# Patient Record
Sex: Female | Born: 1935 | Race: White | Hispanic: No | Smoking: Former smoker
Health system: Southern US, Community
[De-identification: ages and names within clinical notes are randomized; demographics above are authoritative.]

## PROBLEM LIST (undated history)

## (undated) DIAGNOSIS — E039 Hypothyroidism, unspecified: Secondary | ICD-10-CM

## (undated) DIAGNOSIS — F419 Anxiety disorder, unspecified: Secondary | ICD-10-CM

## (undated) DIAGNOSIS — M1710 Unilateral primary osteoarthritis, unspecified knee: Secondary | ICD-10-CM

## (undated) DIAGNOSIS — R0600 Dyspnea, unspecified: Secondary | ICD-10-CM

## (undated) DIAGNOSIS — F339 Major depressive disorder, recurrent, unspecified: Secondary | ICD-10-CM

## (undated) DIAGNOSIS — K5790 Diverticulosis of intestine, part unspecified, without perforation or abscess without bleeding: Secondary | ICD-10-CM

## (undated) DIAGNOSIS — M179 Osteoarthritis of knee, unspecified: Secondary | ICD-10-CM

## (undated) DIAGNOSIS — E213 Hyperparathyroidism, unspecified: Secondary | ICD-10-CM

## (undated) DIAGNOSIS — I82409 Acute embolism and thrombosis of unspecified deep veins of unspecified lower extremity: Secondary | ICD-10-CM

## (undated) DIAGNOSIS — M1380 Other specified arthritis, unspecified site: Secondary | ICD-10-CM

## (undated) DIAGNOSIS — K589 Irritable bowel syndrome without diarrhea: Secondary | ICD-10-CM

## (undated) DIAGNOSIS — E785 Hyperlipidemia, unspecified: Secondary | ICD-10-CM

## (undated) DIAGNOSIS — M1990 Unspecified osteoarthritis, unspecified site: Secondary | ICD-10-CM

## (undated) DIAGNOSIS — I10 Essential (primary) hypertension: Secondary | ICD-10-CM

## (undated) HISTORY — DX: Hyperparathyroidism, unspecified: E21.3

## (undated) HISTORY — PX: WRIST SURGERY: SHX841

## (undated) HISTORY — PX: COLONOSCOPY: SHX174

## (undated) HISTORY — DX: Hypothyroidism, unspecified: E03.9

## (undated) HISTORY — DX: Acute embolism and thrombosis of unspecified deep veins of unspecified lower extremity: I82.409

## (undated) HISTORY — DX: Irritable bowel syndrome, unspecified: K58.9

## (undated) HISTORY — DX: Unilateral primary osteoarthritis, unspecified knee: M17.10

## (undated) HISTORY — DX: Other specified arthritis, unspecified site: M13.80

## (undated) HISTORY — DX: Hyperlipidemia, unspecified: E78.5

## (undated) HISTORY — DX: Unspecified osteoarthritis, unspecified site: M19.90

## (undated) HISTORY — DX: Diverticulosis of intestine, part unspecified, without perforation or abscess without bleeding: K57.90

## (undated) HISTORY — DX: Osteoarthritis of knee, unspecified: M17.9

## (undated) HISTORY — DX: Major depressive disorder, recurrent, unspecified: F33.9

---

## 1989-06-16 HISTORY — PX: ABDOMINAL HYSTERECTOMY: SHX81

## 1991-06-17 HISTORY — PX: LUMBAR FUSION: SHX111

## 1996-06-16 HISTORY — PX: CHOLECYSTECTOMY: SHX55

## 2007-08-15 HISTORY — PX: OTHER SURGICAL HISTORY: SHX169

## 2008-06-16 LAB — HM COLONOSCOPY

## 2010-04-16 HISTORY — PX: MENISCUS REPAIR: SHX5179

## 2010-06-16 DIAGNOSIS — I82409 Acute embolism and thrombosis of unspecified deep veins of unspecified lower extremity: Secondary | ICD-10-CM

## 2010-06-16 HISTORY — DX: Acute embolism and thrombosis of unspecified deep veins of unspecified lower extremity: I82.409

## 2010-06-16 HISTORY — PX: TOTAL KNEE ARTHROPLASTY: SHX125

## 2010-06-16 LAB — HM DEXA SCAN: HM Dexa Scan: NORMAL

## 2011-06-23 DIAGNOSIS — N3090 Cystitis, unspecified without hematuria: Secondary | ICD-10-CM | POA: Diagnosis not present

## 2011-06-23 DIAGNOSIS — R82998 Other abnormal findings in urine: Secondary | ICD-10-CM | POA: Diagnosis not present

## 2011-07-28 DIAGNOSIS — J060 Acute laryngopharyngitis: Secondary | ICD-10-CM | POA: Diagnosis not present

## 2011-08-18 DIAGNOSIS — K5900 Constipation, unspecified: Secondary | ICD-10-CM | POA: Diagnosis not present

## 2011-08-18 DIAGNOSIS — Z86010 Personal history of colonic polyps: Secondary | ICD-10-CM | POA: Diagnosis not present

## 2011-08-18 DIAGNOSIS — K5730 Diverticulosis of large intestine without perforation or abscess without bleeding: Secondary | ICD-10-CM | POA: Diagnosis not present

## 2011-08-20 DIAGNOSIS — Z79899 Other long term (current) drug therapy: Secondary | ICD-10-CM | POA: Diagnosis not present

## 2011-08-20 DIAGNOSIS — E559 Vitamin D deficiency, unspecified: Secondary | ICD-10-CM | POA: Diagnosis not present

## 2011-08-20 DIAGNOSIS — E039 Hypothyroidism, unspecified: Secondary | ICD-10-CM | POA: Diagnosis not present

## 2011-08-20 DIAGNOSIS — E782 Mixed hyperlipidemia: Secondary | ICD-10-CM | POA: Diagnosis not present

## 2011-08-26 DIAGNOSIS — Z1211 Encounter for screening for malignant neoplasm of colon: Secondary | ICD-10-CM | POA: Diagnosis not present

## 2011-08-26 DIAGNOSIS — K5730 Diverticulosis of large intestine without perforation or abscess without bleeding: Secondary | ICD-10-CM | POA: Diagnosis not present

## 2011-08-26 DIAGNOSIS — Z86010 Personal history of colonic polyps: Secondary | ICD-10-CM | POA: Diagnosis not present

## 2011-08-27 DIAGNOSIS — Z538 Procedure and treatment not carried out for other reasons: Secondary | ICD-10-CM | POA: Diagnosis not present

## 2011-09-18 DIAGNOSIS — R05 Cough: Secondary | ICD-10-CM | POA: Diagnosis not present

## 2011-09-18 DIAGNOSIS — R0602 Shortness of breath: Secondary | ICD-10-CM | POA: Diagnosis not present

## 2011-10-29 DIAGNOSIS — F331 Major depressive disorder, recurrent, moderate: Secondary | ICD-10-CM | POA: Diagnosis not present

## 2011-12-02 DIAGNOSIS — K589 Irritable bowel syndrome without diarrhea: Secondary | ICD-10-CM | POA: Diagnosis not present

## 2011-12-02 DIAGNOSIS — K5730 Diverticulosis of large intestine without perforation or abscess without bleeding: Secondary | ICD-10-CM | POA: Diagnosis not present

## 2011-12-02 DIAGNOSIS — Z09 Encounter for follow-up examination after completed treatment for conditions other than malignant neoplasm: Secondary | ICD-10-CM | POA: Diagnosis not present

## 2011-12-02 DIAGNOSIS — Z96659 Presence of unspecified artificial knee joint: Secondary | ICD-10-CM | POA: Diagnosis not present

## 2011-12-23 DIAGNOSIS — H2589 Other age-related cataract: Secondary | ICD-10-CM | POA: Diagnosis not present

## 2011-12-23 DIAGNOSIS — H5034 Intermittent alternating exotropia: Secondary | ICD-10-CM | POA: Diagnosis not present

## 2011-12-23 DIAGNOSIS — L719 Rosacea, unspecified: Secondary | ICD-10-CM | POA: Diagnosis not present

## 2011-12-24 DIAGNOSIS — F331 Major depressive disorder, recurrent, moderate: Secondary | ICD-10-CM | POA: Diagnosis not present

## 2012-01-13 DIAGNOSIS — K589 Irritable bowel syndrome without diarrhea: Secondary | ICD-10-CM | POA: Diagnosis not present

## 2012-01-13 DIAGNOSIS — K5730 Diverticulosis of large intestine without perforation or abscess without bleeding: Secondary | ICD-10-CM | POA: Diagnosis not present

## 2012-01-13 DIAGNOSIS — K5900 Constipation, unspecified: Secondary | ICD-10-CM | POA: Diagnosis not present

## 2012-01-26 DIAGNOSIS — H903 Sensorineural hearing loss, bilateral: Secondary | ICD-10-CM | POA: Diagnosis not present

## 2012-01-26 DIAGNOSIS — H6120 Impacted cerumen, unspecified ear: Secondary | ICD-10-CM | POA: Diagnosis not present

## 2012-01-27 DIAGNOSIS — E210 Primary hyperparathyroidism: Secondary | ICD-10-CM | POA: Diagnosis not present

## 2012-01-27 DIAGNOSIS — E039 Hypothyroidism, unspecified: Secondary | ICD-10-CM | POA: Diagnosis not present

## 2012-01-27 DIAGNOSIS — E559 Vitamin D deficiency, unspecified: Secondary | ICD-10-CM | POA: Diagnosis not present

## 2012-02-03 DIAGNOSIS — M899 Disorder of bone, unspecified: Secondary | ICD-10-CM | POA: Diagnosis not present

## 2012-02-03 DIAGNOSIS — E039 Hypothyroidism, unspecified: Secondary | ICD-10-CM | POA: Diagnosis not present

## 2012-02-03 DIAGNOSIS — E210 Primary hyperparathyroidism: Secondary | ICD-10-CM | POA: Diagnosis not present

## 2012-02-03 DIAGNOSIS — E782 Mixed hyperlipidemia: Secondary | ICD-10-CM | POA: Diagnosis not present

## 2012-02-24 DIAGNOSIS — L719 Rosacea, unspecified: Secondary | ICD-10-CM | POA: Diagnosis not present

## 2012-02-24 DIAGNOSIS — K589 Irritable bowel syndrome without diarrhea: Secondary | ICD-10-CM | POA: Diagnosis not present

## 2012-02-24 DIAGNOSIS — H2589 Other age-related cataract: Secondary | ICD-10-CM | POA: Diagnosis not present

## 2012-03-23 DIAGNOSIS — F331 Major depressive disorder, recurrent, moderate: Secondary | ICD-10-CM | POA: Diagnosis not present

## 2012-04-05 DIAGNOSIS — Z1231 Encounter for screening mammogram for malignant neoplasm of breast: Secondary | ICD-10-CM | POA: Diagnosis not present

## 2012-04-05 LAB — HM MAMMOGRAPHY: HM Mammogram: NEGATIVE

## 2012-04-06 DIAGNOSIS — H2589 Other age-related cataract: Secondary | ICD-10-CM | POA: Diagnosis not present

## 2012-05-05 DIAGNOSIS — E7800 Pure hypercholesterolemia, unspecified: Secondary | ICD-10-CM | POA: Diagnosis not present

## 2012-05-05 DIAGNOSIS — E559 Vitamin D deficiency, unspecified: Secondary | ICD-10-CM | POA: Diagnosis not present

## 2012-05-05 DIAGNOSIS — Z79899 Other long term (current) drug therapy: Secondary | ICD-10-CM | POA: Diagnosis not present

## 2012-05-05 DIAGNOSIS — Z0000 Encounter for general adult medical examination without abnormal findings: Secondary | ICD-10-CM | POA: Diagnosis not present

## 2012-05-18 DIAGNOSIS — H2589 Other age-related cataract: Secondary | ICD-10-CM | POA: Diagnosis not present

## 2012-05-18 DIAGNOSIS — L719 Rosacea, unspecified: Secondary | ICD-10-CM | POA: Diagnosis not present

## 2012-05-27 DIAGNOSIS — F331 Major depressive disorder, recurrent, moderate: Secondary | ICD-10-CM | POA: Diagnosis not present

## 2012-06-16 HISTORY — PX: CATARACT EXTRACTION W/ INTRAOCULAR LENS  IMPLANT, BILATERAL: SHX1307

## 2012-08-11 DIAGNOSIS — E559 Vitamin D deficiency, unspecified: Secondary | ICD-10-CM | POA: Diagnosis not present

## 2012-08-11 DIAGNOSIS — E039 Hypothyroidism, unspecified: Secondary | ICD-10-CM | POA: Diagnosis not present

## 2012-08-11 DIAGNOSIS — E782 Mixed hyperlipidemia: Secondary | ICD-10-CM | POA: Diagnosis not present

## 2012-11-03 DIAGNOSIS — Z7189 Other specified counseling: Secondary | ICD-10-CM | POA: Diagnosis not present

## 2012-11-23 DIAGNOSIS — H2589 Other age-related cataract: Secondary | ICD-10-CM | POA: Diagnosis not present

## 2012-11-23 DIAGNOSIS — L719 Rosacea, unspecified: Secondary | ICD-10-CM | POA: Diagnosis not present

## 2012-12-30 DIAGNOSIS — H2589 Other age-related cataract: Secondary | ICD-10-CM | POA: Diagnosis not present

## 2013-01-19 DIAGNOSIS — Z981 Arthrodesis status: Secondary | ICD-10-CM | POA: Diagnosis not present

## 2013-01-19 DIAGNOSIS — Z96659 Presence of unspecified artificial knee joint: Secondary | ICD-10-CM | POA: Diagnosis not present

## 2013-01-19 DIAGNOSIS — M129 Arthropathy, unspecified: Secondary | ICD-10-CM | POA: Diagnosis not present

## 2013-01-19 DIAGNOSIS — E039 Hypothyroidism, unspecified: Secondary | ICD-10-CM | POA: Diagnosis not present

## 2013-01-19 DIAGNOSIS — H2589 Other age-related cataract: Secondary | ICD-10-CM | POA: Diagnosis not present

## 2013-01-26 DIAGNOSIS — H2589 Other age-related cataract: Secondary | ICD-10-CM | POA: Diagnosis not present

## 2013-02-25 DIAGNOSIS — E039 Hypothyroidism, unspecified: Secondary | ICD-10-CM | POA: Diagnosis not present

## 2013-02-25 DIAGNOSIS — H2589 Other age-related cataract: Secondary | ICD-10-CM | POA: Diagnosis not present

## 2013-03-31 DIAGNOSIS — Z23 Encounter for immunization: Secondary | ICD-10-CM | POA: Diagnosis not present

## 2013-05-23 ENCOUNTER — Encounter: Payer: Self-pay | Admitting: Internal Medicine

## 2013-05-23 ENCOUNTER — Ambulatory Visit (INDEPENDENT_AMBULATORY_CARE_PROVIDER_SITE_OTHER): Payer: Medicare Other | Admitting: Internal Medicine

## 2013-05-23 VITALS — BP 120/78 | HR 87 | Temp 98.6°F | Ht 65.5 in | Wt 158.5 lb

## 2013-05-23 DIAGNOSIS — K589 Irritable bowel syndrome without diarrhea: Secondary | ICD-10-CM | POA: Diagnosis not present

## 2013-05-23 DIAGNOSIS — E785 Hyperlipidemia, unspecified: Secondary | ICD-10-CM | POA: Diagnosis not present

## 2013-05-23 DIAGNOSIS — E039 Hypothyroidism, unspecified: Secondary | ICD-10-CM | POA: Diagnosis not present

## 2013-05-23 DIAGNOSIS — F339 Major depressive disorder, recurrent, unspecified: Secondary | ICD-10-CM | POA: Diagnosis not present

## 2013-05-23 DIAGNOSIS — M179 Osteoarthritis of knee, unspecified: Secondary | ICD-10-CM | POA: Insufficient documentation

## 2013-05-23 DIAGNOSIS — M1710 Unilateral primary osteoarthritis, unspecified knee: Secondary | ICD-10-CM

## 2013-05-23 MED ORDER — PRAVASTATIN SODIUM 40 MG PO TABS: 40.0000 mg | ORAL_TABLET | Freq: Every day | ORAL | Status: DC

## 2013-05-23 NOTE — Assessment & Plan Note (Signed)
Seems euthyroid Had labs 6 months ago Will wait till PE in 6 months to repeat

## 2013-05-23 NOTE — Assessment & Plan Note (Signed)
Discussed primary prevention She is not sure Will review again at next visit

## 2013-05-23 NOTE — Assessment & Plan Note (Signed)
Controlled with her regimen

## 2013-05-23 NOTE — Progress Notes (Signed)
Subjective:    Patient ID: Wendy Murray, female    DOB: 1935-08-30, 77 y.o.   MRN: [id number]  HPI Moved to [facility] this summer With husband  From [place] area Has settled in and enjoying it  Has had recurrent major depression since dad died in 1983-10-27 Some mild exacerbations but now stable for about 10 years Sees psychiatrist Dr Richardean Chimera in [place] regularly Multiple meds  On cholesterol meds for about 15 years Prescribed by endocrinologist No history of vascular disease  Hypothyroidism for many years Quite stable on this with no dose changes for years  Has IBS---controlled with metamucil and miralax Bowels move daily Colonoscopy just showed diverticulosis  No current outpatient prescriptions on file prior to visit.   No current facility-administered medications on file prior to visit.    No Known Allergies  Past Medical History  Diagnosis Date  . Hyperlipidemia   . Recurrent major depression   . Hypothyroidism   . DVT (deep venous thrombosis) 10/27/10    after TKR  . Osteoarthritis, knee   . IBS (irritable bowel syndrome)     Past Surgical History  Procedure Laterality Date  . Meniscus repair Left 11/11  . Total knee arthroplasty Left 10/27/2010  . Lumbar fusion  1993  . Cholecystectomy  1998  . Abdominal hysterectomy  1991  . Cataract extraction w/ intraocular lens  implant, bilateral  2014    Family History  Problem Relation Age of Onset  . Cancer Mother     colon  . Depression Mother   . Cancer Father   . Depression Sister   . Hypertension Neg Hx   . Heart disease Neg Hx   . Diabetes Neg Hx     History   Social History  . Marital Status: Married    Spouse Name: N/A    Number of Children: 3  . Years of Education: N/A   Occupational History  . 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 Smoking status: Former Smoker    Quit date: 06/16/1981  . Smokeless tobacco: Never Used     Comment: quit  1983  . Alcohol Use: Yes     Comment: rarely  . Drug Use: No  . Sexual Activity: Not on file   Other Topics Concern  . Not on file   Social History Narrative   Children in [place], [place] and [place]      Has living will    Husband, then daughter [name], have health care POA   Has DNR ---order rewritten   No feeding tube if cognitively unaware   Review of Systems  Constitutional: Negative for fatigue and unexpected weight change.  HENT: Positive for hearing loss. Negative for dental problem.        Slight hearing issues  Eyes: Negative for visual disturbance.       Recent cataract surgery  Respiratory: Negative for cough, chest tightness and shortness of breath.   Cardiovascular: Negative for chest pain, palpitations and leg swelling.  Gastrointestinal: Negative for nausea, vomiting and blood in stool.       IBS controlled with metamucil and miralax  Genitourinary: Negative for dysuria, urgency and difficulty urinating.  Musculoskeletal: Positive for arthralgias. Negative for back pain and joint swelling.  Skin: Negative for rash.  Allergic/Immunologic: Negative for environmental allergies and immunocompromised state.  Neurological: Negative for dizziness, syncope and light-headedness.       Frequent headaches---takes excedrin  every other day or so. No caffeine in drinks or diet  Psychiatric/Behavioral: Negative for sleep disturbance and dysphoric mood. The patient is not nervous/anxious.        Objective:   Physical Exam  Constitutional: She is oriented to person, place, and time. She appears well-developed and well-nourished. No distress.  HENT:  Mouth/Throat: Oropharynx is clear and moist. No oropharyngeal exudate.  Eyes: Conjunctivae are normal. Pupils are equal, round, and reactive to light.  Neck: Normal range of motion. Neck supple. No thyromegaly present.  Cardiovascular: Normal rate, regular rhythm, normal heart sounds and intact distal pulses.  Exam reveals  no gallop.   No murmur heard. Pulmonary/Chest: Effort normal and breath sounds normal. No respiratory distress. She has no wheezes. She has no rales.  Abdominal: Soft. There is no tenderness.  Musculoskeletal: She exhibits no edema and no tenderness.  Lymphadenopathy:    She has no cervical adenopathy.  Neurological: She is alert and oriented to person, place, and time.  Skin: No rash noted. No erythema.  Psychiatric: She has a normal mood and affect. Her behavior is normal.          Assessment & Plan:

## 2013-05-23 NOTE — Progress Notes (Signed)
Pre-visit discussion using our clinic review tool. No additional management support is needed unless otherwise documented below in the visit note.  

## 2013-05-23 NOTE — Assessment & Plan Note (Signed)
Is involved in fitness program now No major pain issues

## 2013-05-23 NOTE — Assessment & Plan Note (Signed)
Controlled with complicated regimen Continues with psychiatrist

## 2013-05-23 NOTE — Patient Instructions (Signed)
DASH Diet  The DASH diet stands for "Dietary Approaches to Stop Hypertension." It is a healthy eating plan that has been shown to reduce high blood pressure (hypertension) in as little as 14 days, while also possibly providing other significant health benefits. These other health benefits include reducing the risk of breast cancer after menopause and reducing the risk of type 2 diabetes, heart disease, colon cancer, and stroke. Health benefits also include weight loss and slowing kidney failure in patients with chronic kidney disease.   DIET GUIDELINES  · Limit salt (sodium). Your diet should contain less than 1500 mg of sodium daily.  · Limit refined or processed carbohydrates. Your diet should include mostly whole grains. Desserts and added sugars should be used sparingly.  · Include small amounts of heart-healthy fats. These types of fats include nuts, oils, and tub margarine. Limit saturated and trans fats. These fats have been shown to be harmful in the body.  CHOOSING FOODS   The following food groups are based on a 2000 calorie diet. See your Registered Dietitian for individual calorie needs.  Grains and Grain Products (6 to 8 servings daily)  · Eat More Often: Whole-wheat bread, brown rice, whole-grain or wheat pasta, quinoa, popcorn without added fat or salt (air popped).  · Eat Less Often: White bread, white pasta, white rice, cornbread.  Vegetables (4 to 5 servings daily)  · Eat More Often: Fresh, frozen, and canned vegetables. Vegetables may be raw, steamed, roasted, or grilled with a minimal amount of fat.  · Eat Less Often/Avoid: Creamed or fried vegetables. Vegetables in a cheese sauce.  Fruit (4 to 5 servings daily)  · Eat More Often: All fresh, canned (in natural juice), or frozen fruits. Dried fruits without added sugar. One hundred percent fruit juice (½ cup [237 mL] daily).  · Eat Less Often: Dried fruits with added sugar. Canned fruit in light or heavy syrup.  Lean Meats, Fish, and Poultry (2  servings or less daily. One serving is 3 to 4 oz [85-114 g]).  · Eat More Often: Ninety percent or leaner ground beef, tenderloin, sirloin. Round cuts of beef, chicken breast, turkey breast. All fish. Grill, bake, or broil your meat. Nothing should be fried.  · Eat Less Often/Avoid: Fatty cuts of meat, turkey, or chicken leg, thigh, or wing. Fried cuts of meat or fish.  Dairy (2 to 3 servings)  · Eat More Often: Low-fat or fat-free milk, low-fat plain or light yogurt, reduced-fat or part-skim cheese.  · Eat Less Often/Avoid: Milk (whole, 2%). Whole milk yogurt. Full-fat cheeses.  Nuts, Seeds, and Legumes (4 to 5 servings per week)  · Eat More Often: All without added salt.  · Eat Less Often/Avoid: Salted nuts and seeds, canned beans with added salt.  Fats and Sweets (limited)  · Eat More Often: Vegetable oils, tub margarines without trans fats, sugar-free gelatin. Mayonnaise and salad dressings.  · Eat Less Often/Avoid: Coconut oils, palm oils, butter, stick margarine, cream, half and half, cookies, candy, pie.  FOR MORE INFORMATION  The Dash Diet Eating Plan: www.dashdiet.org  Document Released: 05/22/2011 Document Revised: 08/25/2011 Document Reviewed: 05/22/2011  ExitCare® Patient Information ©2014 [employer].

## 2013-05-27 ENCOUNTER — Encounter: Payer: Self-pay | Admitting: Internal Medicine

## 2013-06-03 ENCOUNTER — Encounter: Payer: Self-pay | Admitting: Internal Medicine

## 2013-06-22 DIAGNOSIS — N6489 Other specified disorders of breast: Secondary | ICD-10-CM | POA: Diagnosis not present

## 2013-06-22 DIAGNOSIS — Z1231 Encounter for screening mammogram for malignant neoplasm of breast: Secondary | ICD-10-CM | POA: Diagnosis not present

## 2013-06-23 ENCOUNTER — Encounter: Payer: Self-pay | Admitting: Internal Medicine

## 2013-08-22 DIAGNOSIS — H26499 Other secondary cataract, unspecified eye: Secondary | ICD-10-CM | POA: Diagnosis not present

## 2013-09-13 ENCOUNTER — Other Ambulatory Visit: Payer: Self-pay | Admitting: *Deleted

## 2013-09-13 NOTE — Telephone Encounter (Signed)
Ok to fill 

## 2013-09-14 MED ORDER — LIOTHYRONINE SODIUM 5 MCG PO TABS: 10.0000 ug | ORAL_TABLET | Freq: Every day | ORAL | Status: DC

## 2013-09-14 MED ORDER — PRAVASTATIN SODIUM 40 MG PO TABS: 40.0000 mg | ORAL_TABLET | Freq: Every day | ORAL | Status: DC

## 2013-09-14 MED ORDER — LEVOTHYROXINE SODIUM 75 MCG PO TABS: 75.0000 ug | ORAL_TABLET | Freq: Every day | ORAL | Status: DC

## 2013-09-14 NOTE — Telephone Encounter (Signed)
Okay to fill all for a year

## 2013-09-14 NOTE — Telephone Encounter (Signed)
rx called into pharmacy

## 2013-12-08 DIAGNOSIS — L719 Rosacea, unspecified: Secondary | ICD-10-CM | POA: Diagnosis not present

## 2013-12-08 DIAGNOSIS — H16229 Keratoconjunctivitis sicca, not specified as Sjogren's, unspecified eye: Secondary | ICD-10-CM | POA: Diagnosis not present

## 2013-12-12 ENCOUNTER — Encounter: Payer: Self-pay | Admitting: Internal Medicine

## 2013-12-12 ENCOUNTER — Ambulatory Visit (INDEPENDENT_AMBULATORY_CARE_PROVIDER_SITE_OTHER): Payer: Medicare Other | Admitting: Internal Medicine

## 2013-12-12 VITALS — BP 110/78 | HR 71 | Temp 97.7°F | Ht 65.5 in | Wt 160.0 lb

## 2013-12-12 DIAGNOSIS — M1710 Unilateral primary osteoarthritis, unspecified knee: Secondary | ICD-10-CM | POA: Diagnosis not present

## 2013-12-12 DIAGNOSIS — Z23 Encounter for immunization: Secondary | ICD-10-CM | POA: Diagnosis not present

## 2013-12-12 DIAGNOSIS — Z0000 Encounter for general adult medical examination without abnormal findings: Secondary | ICD-10-CM | POA: Insufficient documentation

## 2013-12-12 DIAGNOSIS — F331 Major depressive disorder, recurrent, moderate: Secondary | ICD-10-CM

## 2013-12-12 DIAGNOSIS — E039 Hypothyroidism, unspecified: Secondary | ICD-10-CM | POA: Diagnosis not present

## 2013-12-12 DIAGNOSIS — E785 Hyperlipidemia, unspecified: Secondary | ICD-10-CM | POA: Diagnosis not present

## 2013-12-12 DIAGNOSIS — M170 Bilateral primary osteoarthritis of knee: Secondary | ICD-10-CM

## 2013-12-12 LAB — COMPREHENSIVE METABOLIC PANEL
ALK PHOS: 70 U/L (ref 39–117)
ALT: 14 U/L (ref 0–35)
AST: 18 U/L (ref 0–37)
Albumin: 4.2 g/dL (ref 3.5–5.2)
BILIRUBIN TOTAL: 0.6 mg/dL (ref 0.2–1.2)
BUN: 16 mg/dL (ref 6–23)
CO2: 29 mEq/L (ref 19–32)
CREATININE: 1 mg/dL (ref 0.4–1.2)
Calcium: 10.6 mg/dL — ABNORMAL HIGH (ref 8.4–10.5)
Chloride: 105 mEq/L (ref 96–112)
GFR: 57.23 mL/min — ABNORMAL LOW (ref >60.00)
GLUCOSE: 102 mg/dL — AB (ref 70–99)
Potassium: 4.7 mEq/L (ref 3.5–5.1)
SODIUM: 141 meq/L (ref 135–145)
TOTAL PROTEIN: 7.4 g/dL (ref 6.0–8.3)

## 2013-12-12 LAB — CBC WITH DIFFERENTIAL/PLATELET
BASOS PCT: 0.5 % (ref 0.0–3.0)
Basophils Absolute: 0 10*3/uL (ref 0.0–0.1)
EOS PCT: 1.2 % (ref 0.0–5.0)
Eosinophils Absolute: 0.1 10*3/uL (ref 0.0–0.7)
HEMATOCRIT: 40.3 % (ref 36.0–46.0)
HEMOGLOBIN: 13.2 g/dL (ref 12.0–15.0)
LYMPHS ABS: 1.7 10*3/uL (ref 0.7–4.0)
Lymphocytes Relative: 22.1 % (ref 12.0–46.0)
MCHC: 32.8 g/dL (ref 30.0–36.0)
MCV: 84.7 fl (ref 78.0–100.0)
MONO ABS: 0.7 10*3/uL (ref 0.1–1.0)
MONOS PCT: 9.6 % (ref 3.0–12.0)
NEUTROS ABS: 5.2 10*3/uL (ref 1.4–7.7)
Neutrophils Relative %: 66.6 % (ref 43.0–77.0)
PLATELETS: 353 10*3/uL (ref 150.0–400.0)
RBC: 4.76 Mil/uL (ref 3.87–5.11)
RDW: 14.6 % (ref 11.5–15.5)
WBC: 7.8 10*3/uL (ref 4.0–10.5)

## 2013-12-12 LAB — LIPID PANEL
CHOLESTEROL: 183 mg/dL (ref 0–200)
HDL: 43.4 mg/dL (ref >39.00)
LDL Cholesterol: 108 mg/dL — ABNORMAL HIGH (ref 0–99)
NonHDL: 139.6
Total CHOL/HDL Ratio: 4
Triglycerides: 158 mg/dL — ABNORMAL HIGH (ref 0.0–149.0)
VLDL: 31.6 mg/dL (ref 0.0–40.0)

## 2013-12-12 LAB — T4, FREE: Free T4: 0.72 ng/dL (ref 0.60–1.60)

## 2013-12-12 LAB — TSH: TSH: 1.04 u[IU]/mL (ref 0.35–4.50)

## 2013-12-12 NOTE — Assessment & Plan Note (Signed)
She is comfortable continuing with primary prevention

## 2013-12-12 NOTE — Assessment & Plan Note (Addendum)
Now exacerbated Probably adjustment process due to move Knows she needs to get involved but hasn't been able to  Will see Dr Elba Barman soon  Subjective cognitive issues more anxiety related or "pseudodementia"

## 2013-12-12 NOTE — Progress Notes (Signed)
Subjective:    Patient ID: Wendy Murray, female    DOB: 08-16-1935, 78 y.o.   MRN: [id number]  HPI Here for Medicare wellness and follow up Reviewed her form and advanced directives Did have 3 falls this year---only after taking OTC sleep med. Has stopped that now Reviewed other physicians No tobacco or alcohol Has noted some cognitive decline---may be related to worsening of depression Mild hearing problems. Vision okay Independent with instrumental ADLs No sexual relationship Reviewed preventative care---has had multiple normal DEXA scans. Recent mammo  Still sees her psychiatrist Feels her depression is worse---but not clear how bad Thinks she may be bored---needs to get involved with some volunteer duties, but has felt "stuck in a rut" Will be seeing Dr Elba Barman soon to review  Still on thyroid meds Weight is stable No change in temperature tolerance--but doesn't like the hot weather No change in skin, hair or nails  No major arthritis issues Able to keep up her exercise level--can't walk as far but no sig pain Some right hip pain if overdoes it Goes to some classes and then to fitness center with husband  Current Outpatient Prescriptions on File Prior to Visit  Medication Sig Dispense Refill  . acetaminophen (TYLENOL) 500 MG tablet Take 500 mg by mouth every 6 (six) hours as needed.      . Aspirin-Acetaminophen-Caffeine (EXCEDRIN PO) Take by mouth as needed.      . calcium carbonate (OS-CAL) 600 MG TABS tablet Take 600 mg by mouth daily with breakfast.      . clonazePAM (KLONOPIN) 2 MG tablet Take 2 mg by mouth daily as needed for anxiety.      Marland Kitchen escitalopram (LEXAPRO) 20 MG tablet Take 40 mg by mouth daily.      Marland Kitchen levothyroxine (LEVOTHROID) 75 MCG tablet Take 1 tablet (75 mcg total) by mouth daily before breakfast.  90 tablet  3  . liothyronine (CYTOMEL) 5 MCG tablet Take 2 tablets (10 mcg total) by mouth daily.  180 tablet  3  . methylphenidate (RITALIN) 10 MG tablet  Take 30 mg by mouth daily.      . Multiple Vitamin (MULTIVITAMIN) tablet Take 1 tablet by mouth daily.      . Omega-3 Fatty Acids (FISH OIL) 1200 MG CAPS Take by mouth. Take 3 by mouth daily      . polyethylene glycol powder (MIRALAX) powder Take 1 Container by mouth daily.      . pravastatin (PRAVACHOL) 40 MG tablet Take 1 tablet (40 mg total) by mouth daily.  90 tablet  3  . Probiotic Product (PROBIOTIC DAILY PO) Take by mouth daily.      . Psyllium (METAMUCIL PO) Take by mouth daily.      . risperiDONE (RISPERDAL) 1 MG tablet Take 1-2 by mouth daily as needed       No current facility-administered medications on file prior to visit.    No Known Allergies  Past Medical History  Diagnosis Date  . Hyperlipidemia   . Recurrent major depression   . Hypothyroidism   . DVT (deep venous thrombosis) 2012    after TKR  . Osteoarthritis, knee   . IBS (irritable bowel syndrome)     Past Surgical History  Procedure Laterality Date  . Meniscus repair Left 11/11  . Total knee arthroplasty Left 2012  . Lumbar fusion  1993  . Cholecystectomy  1998  . Abdominal hysterectomy  1991  . Cataract extraction w/ intraocular lens  implant, bilateral  2014  . Dexa  3/09    Normal    Family History  Problem Relation Age of Onset  . Cancer Mother     colon  . Depression Mother   . Cancer Father   . Depression Sister   . Hypertension Neg Hx   . Heart disease Neg Hx   . Diabetes Neg Hx     History   Social History  . Marital Status: Married    Spouse Name: N/A    Number of Children: 3  . Years of Education: N/A   Occupational History  . 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 Smoking status: Former Smoker    Quit date: 06/16/1981  . Smokeless tobacco: Never Used     Comment: quit 1983  . Alcohol Use: Yes     Comment: rarely  . Drug Use: No  . Sexual Activity: Not on file   Other Topics Concern  . Not on file   Social History  Narrative   [place]      Has living will    Husband, then daughter Joycelyn Schmid, have health care POA   Has DNR ---order rewritten   No feeding tube if cognitively unaware   Review of Systems She is concerned that her memory is worse--"can't figure things out". Worried about just getting here (afraid she would miss the turn) Sleeping is okay--gets 8 hours. Has cut back on her sleep med (clonazepam) Bowels alternate between constipation and diarrhea. Uses metamucil or miralax as needed Rare urinary incontinence--doesn't use pad (rare urge dribbling) Appetite is okay Weight is stable    Objective:   Physical Exam  Constitutional: She is oriented to person, place, and time. She appears well-developed and well-nourished. No distress.  HENT:  Mouth/Throat: Oropharynx is clear and moist. No oropharyngeal exudate.  Neck: Normal range of motion. Neck supple. No thyromegaly present.  Cardiovascular: Normal rate, regular rhythm, normal heart sounds and intact distal pulses.  Exam reveals no gallop.   No murmur heard. Pulmonary/Chest: Effort normal and breath sounds normal. No respiratory distress. She has no wheezes. She has no rales.  Abdominal: Soft. There is no tenderness.  Musculoskeletal: She exhibits no edema and no tenderness.  Lymphadenopathy:    She has no cervical adenopathy.  Neurological: She is alert and oriented to person, place, and time.  President-- "Elyn Peers, Shawn Stall [place]" [phone] D-l-r-o-w Recall 3/3  Skin: No rash noted. No erythema.  Psychiatric:  Anxious and slightly tearful Sad mood for much of visit Normal appearance and speech Appropriate affect          Assessment & Plan:

## 2013-12-12 NOTE — Addendum Note (Signed)
Addended by: Despina Hidden on: 12/12/2013 11:33 AM   Modules accepted: Orders

## 2013-12-12 NOTE — Assessment & Plan Note (Signed)
Seems euthyroid Due for labs 

## 2013-12-12 NOTE — Assessment & Plan Note (Signed)
I have personally reviewed the Medicare Annual Wellness questionnaire and have noted 1. [name] medical and social history 2. Their use of alcohol, tobacco or illicit drugs 3. Their current medications and supplements 4. [name] functional ability including ADL's, fall risks, home safety risks and hearing or visual             impairment. 5. Diet and physical activities 6. Evidence for depression or mood disorders  The patients weight, height, BMI and visual acuity have been recorded in the chart I have made referrals, counseling and provided education to the patient based review of the above and I have provided the pt with a written personalized care plan for preventive services.  I have provided you with a copy of your personalized plan for preventive services. Please take the time to review along with your updated medication list.  prevnar today Discussed mammograms---will probably stop due to age. Recently done. PE deferred UTD on colon and other immunizations Mild subjective cognitive issues not confirmed on exam

## 2013-12-12 NOTE — Assessment & Plan Note (Signed)
Symptoms have been mild lately

## 2013-12-12 NOTE — Progress Notes (Signed)
Pre visit review using our clinic review tool, if applicable. No additional management support is needed unless otherwise documented below in the visit note. 

## 2014-04-28 DIAGNOSIS — Z23 Encounter for immunization: Secondary | ICD-10-CM | POA: Diagnosis not present

## 2014-05-29 DIAGNOSIS — H5034 Intermittent alternating exotropia: Secondary | ICD-10-CM | POA: Diagnosis not present

## 2014-05-29 DIAGNOSIS — H26491 Other secondary cataract, right eye: Secondary | ICD-10-CM | POA: Diagnosis not present

## 2014-05-29 DIAGNOSIS — H16223 Keratoconjunctivitis sicca, not specified as Sjogren's, bilateral: Secondary | ICD-10-CM | POA: Diagnosis not present

## 2014-06-18 ENCOUNTER — Other Ambulatory Visit: Payer: Self-pay | Admitting: Internal Medicine

## 2014-09-22 ENCOUNTER — Other Ambulatory Visit: Payer: Self-pay

## 2014-09-22 MED ORDER — PRAVASTATIN SODIUM 40 MG PO TABS: 40.0000 mg | ORAL_TABLET | Freq: Every day | ORAL | Status: DC

## 2014-09-22 NOTE — Telephone Encounter (Signed)
[place] left v/m requesting refill pravastatin; # 90 x 1 done.

## 2014-10-02 ENCOUNTER — Other Ambulatory Visit: Payer: Self-pay | Admitting: Internal Medicine

## 2014-10-02 DIAGNOSIS — M2042 Other hammer toe(s) (acquired), left foot: Secondary | ICD-10-CM | POA: Diagnosis not present

## 2014-10-02 DIAGNOSIS — L97529 Non-pressure chronic ulcer of other part of left foot with unspecified severity: Secondary | ICD-10-CM | POA: Diagnosis not present

## 2014-10-02 DIAGNOSIS — M79675 Pain in left toe(s): Secondary | ICD-10-CM | POA: Diagnosis not present

## 2014-10-15 HISTORY — PX: HAMMER TOE SURGERY: SHX385

## 2014-10-18 DIAGNOSIS — Z79899 Other long term (current) drug therapy: Secondary | ICD-10-CM | POA: Diagnosis not present

## 2014-10-18 DIAGNOSIS — M2042 Other hammer toe(s) (acquired), left foot: Secondary | ICD-10-CM | POA: Diagnosis not present

## 2014-10-18 DIAGNOSIS — M71572 Other bursitis, not elsewhere classified, left ankle and foot: Secondary | ICD-10-CM | POA: Diagnosis not present

## 2014-10-24 DIAGNOSIS — M2040 Other hammer toe(s) (acquired), unspecified foot: Secondary | ICD-10-CM | POA: Diagnosis not present

## 2014-10-24 DIAGNOSIS — M2042 Other hammer toe(s) (acquired), left foot: Secondary | ICD-10-CM | POA: Diagnosis not present

## 2014-10-30 DIAGNOSIS — M25572 Pain in left ankle and joints of left foot: Secondary | ICD-10-CM | POA: Diagnosis not present

## 2014-11-08 DIAGNOSIS — M25572 Pain in left ankle and joints of left foot: Secondary | ICD-10-CM | POA: Diagnosis not present

## 2014-12-11 DIAGNOSIS — M25572 Pain in left ankle and joints of left foot: Secondary | ICD-10-CM | POA: Diagnosis not present

## 2014-12-13 ENCOUNTER — Ambulatory Visit (INDEPENDENT_AMBULATORY_CARE_PROVIDER_SITE_OTHER): Payer: Medicare Other | Admitting: Internal Medicine

## 2014-12-13 ENCOUNTER — Encounter: Payer: Self-pay | Admitting: Internal Medicine

## 2014-12-13 VITALS — BP 128/70 | HR 97 | Temp 98.2°F | Ht 66.0 in | Wt 166.0 lb

## 2014-12-13 DIAGNOSIS — E785 Hyperlipidemia, unspecified: Secondary | ICD-10-CM

## 2014-12-13 DIAGNOSIS — K589 Irritable bowel syndrome without diarrhea: Secondary | ICD-10-CM | POA: Diagnosis not present

## 2014-12-13 DIAGNOSIS — R519 Headache, unspecified: Secondary | ICD-10-CM

## 2014-12-13 DIAGNOSIS — R51 Headache: Secondary | ICD-10-CM

## 2014-12-13 DIAGNOSIS — E039 Hypothyroidism, unspecified: Secondary | ICD-10-CM

## 2014-12-13 DIAGNOSIS — Z7189 Other specified counseling: Secondary | ICD-10-CM

## 2014-12-13 DIAGNOSIS — Z0000 Encounter for general adult medical examination without abnormal findings: Secondary | ICD-10-CM

## 2014-12-13 DIAGNOSIS — F3341 Major depressive disorder, recurrent, in partial remission: Secondary | ICD-10-CM

## 2014-12-13 LAB — CBC WITH DIFFERENTIAL/PLATELET
BASOS ABS: 0 10*3/uL (ref 0.0–0.1)
Basophils Relative: 0.5 % (ref 0.0–3.0)
EOS ABS: 0.1 10*3/uL (ref 0.0–0.7)
Eosinophils Relative: 1.6 % (ref 0.0–5.0)
HEMATOCRIT: 41.8 % (ref 36.0–46.0)
Hemoglobin: 13.8 g/dL (ref 12.0–15.0)
LYMPHS ABS: 1.9 10*3/uL (ref 0.7–4.0)
Lymphocytes Relative: 21.7 % (ref 12.0–46.0)
MCHC: 32.9 g/dL (ref 30.0–36.0)
MCV: 84.7 fl (ref 78.0–100.0)
MONO ABS: 0.9 10*3/uL (ref 0.1–1.0)
Monocytes Relative: 10.1 % (ref 3.0–12.0)
NEUTROS PCT: 66.1 % (ref 43.0–77.0)
Neutro Abs: 5.7 10*3/uL (ref 1.4–7.7)
PLATELETS: 369 10*3/uL (ref 150.0–400.0)
RBC: 4.94 Mil/uL (ref 3.87–5.11)
RDW: 14.6 % (ref 11.5–15.5)
WBC: 8.6 10*3/uL (ref 4.0–10.5)

## 2014-12-13 LAB — COMPREHENSIVE METABOLIC PANEL
ALBUMIN: 4 g/dL (ref 3.5–5.2)
ALT: 22 U/L (ref 0–35)
AST: 24 U/L (ref 0–37)
Alkaline Phosphatase: 83 U/L (ref 39–117)
BUN: 20 mg/dL (ref 6–23)
CALCIUM: 11.4 mg/dL — AB (ref 8.4–10.5)
CHLORIDE: 101 meq/L (ref 96–112)
CO2: 30 mEq/L (ref 19–32)
Creatinine, Ser: 1.04 mg/dL (ref 0.40–1.20)
GFR: 54.55 mL/min — ABNORMAL LOW (ref >60.00)
Glucose, Bld: 93 mg/dL (ref 70–99)
Potassium: 4.2 mEq/L (ref 3.5–5.1)
Sodium: 139 mEq/L (ref 135–145)
Total Bilirubin: 0.3 mg/dL (ref 0.2–1.2)
Total Protein: 7.3 g/dL (ref 6.0–8.3)

## 2014-12-13 LAB — LIPID PANEL
Cholesterol: 182 mg/dL (ref 0–200)
HDL: 39.6 mg/dL (ref >39.00)
NonHDL: 142.4
Total CHOL/HDL Ratio: 5
Triglycerides: 224 mg/dL — ABNORMAL HIGH (ref 0.0–149.0)
VLDL: 44.8 mg/dL — ABNORMAL HIGH (ref 0.0–40.0)

## 2014-12-13 LAB — LDL CHOLESTEROL, DIRECT: Direct LDL: 112 mg/dL

## 2014-12-13 LAB — TSH: TSH: 1.39 u[IU]/mL (ref 0.35–4.50)

## 2014-12-13 LAB — T4, FREE: Free T4: 0.61 ng/dL (ref 0.60–1.60)

## 2014-12-13 LAB — T3, FREE: T3, Free: 3.5 pg/mL (ref 2.3–4.2)

## 2014-12-13 NOTE — Assessment & Plan Note (Signed)
Seems to be euthyroid Due for labs

## 2014-12-13 NOTE — Assessment & Plan Note (Signed)
Doing better since on effexor Mirtazapine also new Will check glucose given risperidone use

## 2014-12-13 NOTE — Progress Notes (Signed)
Subjective:    Patient ID: Wendy Murray, female    DOB: 07/11/1935, 79 y.o.   MRN: [id number]  HPI Here for Medicare wellness and follow up of chronic medical conditions Reviewed form and advanced directives Did have hammertoe repair in past year Reviewed other doctors Has occasional drink No tobacco now Does do regular exercise Vision okay except having trouble with seeing computer screen. Occasional mild hearing problems Almost fell once--missed a step-- but didn't fall Independent with instrumental ADLs Thinks her memory and thinking are fairly normal  Having chronic problems with stomach ache Chronic irritable bowel but now has daily bloating--hard to fit into pants Uses metamucil and miralax alternating to help bowels Loose stools at times--but mostly constipated.  Usually goes every 2-3 days Did have BSO with hyster Has affected her appetite --"afraid to eat sometimes" Weight up 6# since last year  Having chronic headaches May be related to her eyes--- having exam tomorrow Thinks she needs trifocals--discussed safety concerns Awakens with them-- got new pillow to see if that helps. Bifrontal and achy/dull Goes away with excedrin migraine--taking daily No other caffeine Going on years Discussed holding regular meds  Continues with Dr Elba Barman Depression seems to be better controlled--since on effexor Also now on mirtazapine  Thinks thyroid is okay No new changes in skin, hair, nails Takes fish oil--3 a day  Continues on the statin No myalgias Discussed the GI symptoms  Mild arthritis No meds for this  Current Outpatient Prescriptions on File Prior to Visit  Medication Sig Dispense Refill  . acetaminophen (TYLENOL) 500 MG tablet Take 500 mg by mouth every 6 (six) hours as needed.    . Aspirin-Acetaminophen-Caffeine (EXCEDRIN PO) Take by mouth as needed.    . calcium carbonate (OS-CAL) 600 MG TABS tablet Take 600 mg by mouth daily with breakfast.    .  clonazePAM (KLONOPIN) 2 MG tablet Take 2 mg by mouth daily as needed for anxiety.    Marland Kitchen levothyroxine (LEVOTHROID) 75 MCG tablet Take 1 tablet (75 mcg total) by mouth daily before breakfast. 90 tablet 3  . liothyronine (CYTOMEL) 5 MCG tablet TAKE TWO TABLETS BY MOUTH ONCE DAILY 180 tablet 0  . Multiple Vitamin (MULTIVITAMIN) tablet Take 1 tablet by mouth daily.    . Omega-3 Fatty Acids (FISH OIL) 1200 MG CAPS Take by mouth. Take 3 by mouth daily    . polyethylene glycol powder (MIRALAX) powder Take 1 Container by mouth daily.    . pravastatin (PRAVACHOL) 40 MG tablet Take 1 tablet (40 mg total) by mouth daily. 90 tablet 1  . Probiotic Product (PROBIOTIC DAILY PO) Take by mouth daily.    . Psyllium (METAMUCIL PO) Take by mouth daily.    . risperiDONE (RISPERDAL) 1 MG tablet Take 1-2 by mouth daily as needed     No current facility-administered medications on file prior to visit.    No Known Allergies  Past Medical History  Diagnosis Date  . Hyperlipidemia   . Recurrent major depression   . Hypothyroidism   . DVT (deep venous thrombosis) 2012    after TKR  . Osteoarthritis, knee   . IBS (irritable bowel syndrome)     Past Surgical History  Procedure Laterality Date  . Meniscus repair Left 11/11  . Total knee arthroplasty Left 2012  . Lumbar fusion  1993  . Cholecystectomy  1998  . Abdominal hysterectomy  1991  . Cataract extraction w/ intraocular lens  implant, bilateral  2014  . Dexa  3/09    Normal  . Hammer toe surgery  5/16    Dr Barkley Bruns    Family History  Problem Relation Age of Onset  . Cancer Mother     colon  . Depression Mother   . Cancer Father   . Depression Sister   . Hypertension Neg Hx   . Heart disease Neg Hx   . Diabetes Neg Hx     History   Social History  . Marital Status: Married    Spouse Name: N/A  . Number of Children: 3  . Years of Education: N/A   Occupational History  . 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 Smoking status: Former Smoker    Quit date: 06/16/1981  . Smokeless tobacco: Never Used     Comment: quit 1983  . Alcohol Use: Yes     Comment: rarely  . Drug Use: No  . Sexual Activity: Not on file   Other Topics Concern  . Not on file   Social History Narrative   [place] in [place]      Has living will    Husband, then daughter Joycelyn Schmid, have health care POA   Has DNR ---order rewritten   No feeding tube if cognitively unaware   Review of Systems Sleeps okay with the mirtazapine and clonazepam Teeth okay--keeps up with dentist No chest pain No palpitations No syncope. Occasional dizziness if she pops up quickly from sitting    Objective:   Physical Exam  Constitutional: She is oriented to person, place, and time. She appears well-developed and well-nourished. No distress.  HENT:  Mouth/Throat: Oropharynx is clear and moist. No oropharyngeal exudate.  Neck: Normal range of motion. Neck supple. No thyromegaly present.  Cardiovascular: Normal rate, regular rhythm, normal heart sounds and intact distal pulses.  Exam reveals no gallop.   No murmur heard. Pulmonary/Chest: Effort normal and breath sounds normal. No respiratory distress. She has no wheezes. She has no rales.  Abdominal: Soft. There is no tenderness.  Musculoskeletal: She exhibits no edema or tenderness.  Lymphadenopathy:    She has no cervical adenopathy.  Neurological: She is alert and oriented to person, place, and time.  President-- "Elyn Peers, Shawn Stall [place]" [phone] D-l-r-o-w Recall 3/3  Skin: No rash noted. No erythema.  Psychiatric: She has a normal mood and affect. Her behavior is normal.          Assessment & Plan:

## 2014-12-13 NOTE — Progress Notes (Signed)
Pre visit review using our clinic review tool, if applicable. No additional management support is needed unless otherwise documented below in the visit note. 

## 2014-12-13 NOTE — Assessment & Plan Note (Signed)
I have personally reviewed the Medicare Annual Wellness questionnaire and have noted 1. [name] medical and social history 2. Their use of alcohol, tobacco or illicit drugs 3. Their current medications and supplements 4. [name] functional ability including ADL's, fall risks, home safety risks and hearing or visual             impairment. 5. Diet and physical activities 6. Evidence for depression or mood disorders  The patients weight, height, BMI and visual acuity have been recorded in the chart I have made referrals, counseling and provided education to the patient based review of the above and I have provided the pt with a written personalized care plan for preventive services.  I have provided you with a copy of your personalized plan for preventive services. Please take the time to review along with your updated medication list.  Yearly flu shot No cancer screening due to age--after discussion

## 2014-12-13 NOTE — Assessment & Plan Note (Signed)
For years or decades Asked her not to take the excedrin migraine daily-- 2-3 times per week only

## 2014-12-13 NOTE — Assessment & Plan Note (Signed)
More bloating and abdominal symptoms Will try off the fish oil and statin May need to reconsider effexor if persists

## 2014-12-13 NOTE — Patient Instructions (Signed)
Please stop the pravastatin and fish oil for now--to see if it helps your stomach.

## 2014-12-13 NOTE — Assessment & Plan Note (Signed)
Has DNR 

## 2014-12-13 NOTE — Assessment & Plan Note (Signed)
Discussed primary prevention In view of her stomach issues--will stop for now

## 2014-12-14 DIAGNOSIS — Z961 Presence of intraocular lens: Secondary | ICD-10-CM | POA: Diagnosis not present

## 2014-12-18 ENCOUNTER — Other Ambulatory Visit: Payer: Self-pay | Admitting: Internal Medicine

## 2014-12-19 ENCOUNTER — Telehealth: Payer: Self-pay | Admitting: Internal Medicine

## 2014-12-19 ENCOUNTER — Telehealth: Payer: Self-pay | Admitting: *Deleted

## 2014-12-19 DIAGNOSIS — K589 Irritable bowel syndrome without diarrhea: Secondary | ICD-10-CM

## 2014-12-19 MED ORDER — LEVOTHYROXINE SODIUM 75 MCG PO TABS: 75.0000 ug | ORAL_TABLET | Freq: Every day | ORAL | Status: DC

## 2014-12-19 NOTE — Telephone Encounter (Signed)
Pt called wanting to get a referral to a GI dr.   She stated dr Silvio Pate was aware of her problem She would like to go to [facility] no Thursday and any time

## 2014-12-19 NOTE — Telephone Encounter (Signed)
rx sent to pharmacy by e-script  

## 2014-12-20 NOTE — Telephone Encounter (Signed)
Referral made 

## 2014-12-25 ENCOUNTER — Ambulatory Visit (INDEPENDENT_AMBULATORY_CARE_PROVIDER_SITE_OTHER): Payer: Medicare Other | Admitting: Internal Medicine

## 2014-12-25 ENCOUNTER — Encounter: Payer: Self-pay | Admitting: Internal Medicine

## 2014-12-25 VITALS — BP 112/76 | HR 84 | Ht 66.0 in | Wt 165.0 lb

## 2014-12-25 DIAGNOSIS — Z800 Family history of malignant neoplasm of digestive organs: Secondary | ICD-10-CM | POA: Diagnosis not present

## 2014-12-25 DIAGNOSIS — K589 Irritable bowel syndrome without diarrhea: Secondary | ICD-10-CM | POA: Diagnosis not present

## 2014-12-25 DIAGNOSIS — K5901 Slow transit constipation: Secondary | ICD-10-CM

## 2014-12-25 DIAGNOSIS — R1030 Lower abdominal pain, unspecified: Secondary | ICD-10-CM | POA: Diagnosis not present

## 2014-12-25 NOTE — Progress Notes (Signed)
HISTORY OF PRESENT ILLNESS:  Wendy Murray is a 79 y.o. female with past medical history as listed below. She refers herself today for evaluation of chronic irritable bowel syndrome. She is new to this practice. Patient reports a long-standing history of irritable bowel syndrome is manifested by abdominal pain associated with bloating and alternating bowel habits. The main tendency is toward constipation. The frequency and severity berries with time. She has not noticed any particular inciting or exacerbating factors. She does have occasional episodes of diarrhea. Has tried fiber without change. She will occasionally use Imodium for diarrhea which results in constipation. She has tried MiraLAX on a limited basis. Rarely she'll have vomiting with abdominal cramping. She has seen a gastroenterologist and probably for years. She has had multiple colonoscopies because of a family history of colon cancer in her mother. The patient is reported to have had a personal history of colon polyps. I have a report of her last complete colonoscopy 08/21/2008. This revealed sigmoid diverticulosis but was otherwise normal to the cecum. Follow-up in 3 years was recommended. She tells me that she underwent that examination which was incomplete. Subsequent barium enema was negative. No particular follow-up recommendation made. Review of outside record also shows a CT scan of the abdomen and pelvis 05/06/2010 to evaluate abdominal pain and constipation. This was unremarkable except for increased stool in the colon. The patient denies GI bleeding or weight loss. In addition to outside records reviewed, I have also reviewed outside data. Blood work from 2 weeks ago was unremarkable including normal CBC with differential, thyroid profile, and comprehensive metabolic panel (save calcium 11.4, which is being monitored by her PCP)  REVIEW OF SYSTEMS:  All non-GI ROS negative except for anxiety, depression, arthritis, back  pain  Past Medical History  Diagnosis Date  . Hyperlipidemia   . Recurrent major depression   . Hypothyroidism   . DVT (deep venous thrombosis) 2012    after TKR  . Osteoarthritis, knee   . IBS (irritable bowel syndrome)   . Diverticulosis     Past Surgical History  Procedure Laterality Date  . Meniscus repair Left 11/11  . Total knee arthroplasty Left 2012  . Lumbar fusion  1993  . Cholecystectomy  1998  . Abdominal hysterectomy  1991  . Cataract extraction w/ intraocular lens  implant, bilateral  2014  . Dexa  3/09    Normal  . Hammer toe surgery  5/16    Dr Barkley Bruns    Social History Wendy Murray  reports that she quit smoking about 33 years ago. She has never used smokeless tobacco. She reports that she drinks alcohol. She reports that she does not use illicit drugs.  family history includes Cancer in her father and mother; Depression in her mother and sister. There is no history of Hypertension, Heart disease, or Diabetes.  No Known Allergies     PHYSICAL EXAMINATION: Vital signs: BP 112/76 mmHg  Pulse 84  Ht 5\' 6"  (1.676 m)  Wt 165 lb (74.844 kg)  BMI 26.64 kg/m2  Constitutional: generally well-appearing, no acute distress Psychiatric: alert and oriented x3, cooperative Eyes: extraocular movements intact, anicteric, conjunctiva pink Mouth: oral pharynx moist, no lesions Neck: supple no lymphadenopathy Cardiovascular: heart regular rate and rhythm, no murmur Lungs: clear to auscultation bilaterally Abdomen: soft, obese, nontender, nondistended, no obvious ascites, no peritoneal signs, normal bowel sounds, no organomegaly Rectal: Omitted Extremities: no clubbing cyanosis or lower extremity edema bilaterally Skin: no lesions on visible extremities Neuro:  No focal deficits. No asterixis.    ASSESSMENT:   #1. Constipation predominant irritable bowel syndrome #2. Family history of colon cancer negative colonoscopy 2010.Negative attempted colonoscopy  and barium enema 2 years ago   PLAN:  #1. Educational discussion on irritable bowel syndrome #2. Recommended MiraLAX to achieve regular bowel habits. Carefully instructed on proper use #3. Decided against future surveillance colonoscopy unless otherwise clinically indicated #4. She'll was in care with her primary care provider. GI follow-up as needed

## 2014-12-25 NOTE — Patient Instructions (Signed)
Please follow up as needed 

## 2015-01-30 ENCOUNTER — Ambulatory Visit (INDEPENDENT_AMBULATORY_CARE_PROVIDER_SITE_OTHER): Payer: Medicare Other | Admitting: Internal Medicine

## 2015-01-30 ENCOUNTER — Encounter: Payer: Self-pay | Admitting: Internal Medicine

## 2015-01-30 VITALS — BP 108/70 | HR 72 | Temp 98.2°F | Wt 161.0 lb

## 2015-01-30 DIAGNOSIS — R1013 Epigastric pain: Secondary | ICD-10-CM

## 2015-01-30 NOTE — Assessment & Plan Note (Signed)
PUD is unlikely but possible This is different than her lower abdominal IBS though No NSAID or other inciting factors Will try empiric omeprazole for 2 weeks--- take for 6 if pain resolved Consider H pylori testing if recurs F/U with Dr Henrene Pastor if no relief

## 2015-01-30 NOTE — Progress Notes (Signed)
Subjective:    Patient ID: Wendy Murray, female    DOB: 1935/08/17, 79 y.o.   MRN: [id number]  HPI Here due to abdominal pain  Worried about an ulcer Different pain in the past 3 weeks Dull ache-- sometimes better after eating Ongoing bloating--affecting her comfort wearing clothes  No nausea or vomiting Bowels are about the same---working fairly well Appetite is not great  No OTC NSAIDs--just occasional tylenol No smoking or alcohol  Current Outpatient Prescriptions on File Prior to Visit  Medication Sig Dispense Refill  . acetaminophen (TYLENOL) 500 MG tablet Take 500 mg by mouth every 6 (six) hours as needed.    . Aspirin-Acetaminophen-Caffeine (EXCEDRIN PO) Take by mouth as needed.    Marland Kitchen levothyroxine (LEVOTHROID) 75 MCG tablet Take 1 tablet (75 mcg total) by mouth daily before breakfast. 90 tablet 3  . liothyronine (CYTOMEL) 5 MCG tablet TAKE TWO TABLETS BY MOUTH ONCE DAILY 180 tablet 0  . mirtazapine (REMERON) 15 MG tablet Take 15 mg by mouth at bedtime.     . Multiple Vitamin (MULTIVITAMIN) tablet Take 1 tablet by mouth daily.    . polyethylene glycol powder (MIRALAX) powder Take 1 Container by mouth daily.    . Probiotic Product (PROBIOTIC DAILY PO) Take by mouth daily.    . Psyllium (METAMUCIL PO) Take by mouth daily.    . risperiDONE (RISPERDAL) 1 MG tablet Take 1-2 by mouth daily as needed    . venlafaxine XR (EFFEXOR-XR) 150 MG 24 hr capsule Take 150 mg by mouth daily with breakfast. Along with 75mg     . venlafaxine XR (EFFEXOR-XR) 75 MG 24 hr capsule Take 75 mg by mouth daily with breakfast. Along with 150mg      No current facility-administered medications on file prior to visit.    No Known Allergies  Past Medical History  Diagnosis Date  . Hyperlipidemia   . Recurrent major depression   . Hypothyroidism   . DVT (deep venous thrombosis) 2012    after TKR  . Osteoarthritis, knee   . IBS (irritable bowel syndrome)   . Diverticulosis     Past  Surgical History  Procedure Laterality Date  . Meniscus repair Left 11/11  . Total knee arthroplasty Left 2012  . Lumbar fusion  1993  . Cholecystectomy  1998  . Abdominal hysterectomy  1991  . Cataract extraction w/ intraocular lens  implant, bilateral  2014  . Dexa  3/09    Normal  . Hammer toe surgery  5/16    Dr Barkley Bruns    Family History  Problem Relation Age of Onset  . Cancer Mother     colon  . Depression Mother   . Cancer Father   . Depression Sister   . Hypertension Neg Hx   . Heart disease Neg Hx   . Diabetes Neg Hx     Social History   Social History  . Marital Status: Married    Spouse Name: N/A  . Number of Children: 3  . Years of Education: N/A   Occupational History  . 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 Smoking status: Former Smoker    Quit date: 06/16/1981  . Smokeless tobacco: Never Used     Comment: quit 1983  . Alcohol Use: 0.0 oz/week    0 Standard drinks or equivalent per week     Comment: rarely  . Drug Use: No  . Sexual Activity: Not  on file   Other Topics Concern  . Not on file   Social History Narrative   [place]      Has living will    Husband, then daughter Joycelyn Schmid, have health care POA   Has DNR ---order rewritten   No feeding tube if cognitively unaware   Review of Systems Has changed diet-- bland (oatmeal, chicken, etc)----due to diarrhea and constipation Has lost some weight    Objective:   Physical Exam  Constitutional: She appears well-developed and well-nourished. No distress.  Neck: Normal range of motion. Neck supple. No thyromegaly present.  Cardiovascular: Normal rate, regular rhythm and normal heart sounds.  Exam reveals no gallop.   No murmur heard. Pulmonary/Chest: Effort normal and breath sounds normal. No respiratory distress. She has no wheezes. She has no rales.  Abdominal: Soft. She exhibits no distension. There is no  tenderness. There is no rebound and no guarding.  Epigastrium is where her pain is ---but not tender  Musculoskeletal: She exhibits no edema.  Lymphadenopathy:    She has no cervical adenopathy.          Assessment & Plan:

## 2015-01-30 NOTE — Progress Notes (Signed)
Pre visit review using our clinic review tool, if applicable. No additional management support is needed unless otherwise documented below in the visit note. 

## 2015-01-30 NOTE — Patient Instructions (Signed)
Please try over the counter omeprazole 20mg  daily on an empty stomach. Try this for 2 weeks. If the pain is relieved--continue the medication for 6 weeks. If not, set up an appointment with Dr Henrene Pastor.

## 2015-03-31 DIAGNOSIS — Z23 Encounter for immunization: Secondary | ICD-10-CM | POA: Diagnosis not present

## 2015-06-06 ENCOUNTER — Encounter: Payer: Self-pay | Admitting: Family Medicine

## 2015-06-06 ENCOUNTER — Ambulatory Visit (INDEPENDENT_AMBULATORY_CARE_PROVIDER_SITE_OTHER): Payer: Medicare Other | Admitting: Family Medicine

## 2015-06-06 VITALS — BP 116/74 | HR 99 | Temp 97.6°F | Ht 66.0 in | Wt 165.5 lb

## 2015-06-06 DIAGNOSIS — S39012A Strain of muscle, fascia and tendon of lower back, initial encounter: Secondary | ICD-10-CM | POA: Diagnosis not present

## 2015-06-06 DIAGNOSIS — M170 Bilateral primary osteoarthritis of knee: Secondary | ICD-10-CM

## 2015-06-06 DIAGNOSIS — M25561 Pain in right knee: Secondary | ICD-10-CM

## 2015-06-06 MED ORDER — BACLOFEN 10 MG PO TABS: 10.0000 mg | ORAL_TABLET | Freq: Three times a day (TID) | ORAL | Status: DC | PRN

## 2015-06-06 NOTE — Patient Instructions (Signed)
Nice to meet you. We will refer you to an orthopedic surgeon for your knee. He you have likely strained her back. We will treat this with baclofen. You can continue Tylenol as needed and Excedrin as needed. Please do not use too much Excedrin as this can cause kidney issues and stomach upset. If you develop fever, numbness, weakness, numbness between her legs, loss of bowel or bladder function, worsening back pain, swelling in her knee, redness in her knee, working her knee, or any new or changing symptoms please seek medical attention.

## 2015-06-06 NOTE — Progress Notes (Signed)
Pre visit review using our clinic review tool, if applicable. No additional management support is needed unless otherwise documented below in the visit note. 

## 2015-06-07 ENCOUNTER — Telehealth: Payer: Self-pay | Admitting: *Deleted

## 2015-06-07 ENCOUNTER — Other Ambulatory Visit: Payer: Self-pay | Admitting: Family Medicine

## 2015-06-07 ENCOUNTER — Ambulatory Visit: Payer: No Typology Code available for payment source | Admitting: Internal Medicine

## 2015-06-07 ENCOUNTER — Ambulatory Visit (INDEPENDENT_AMBULATORY_CARE_PROVIDER_SITE_OTHER): Payer: Medicare Other | Admitting: Internal Medicine

## 2015-06-07 ENCOUNTER — Encounter: Payer: Self-pay | Admitting: Internal Medicine

## 2015-06-07 VITALS — BP 120/68 | HR 68 | Temp 97.4°F | Wt 161.0 lb

## 2015-06-07 DIAGNOSIS — M791 Myalgia, unspecified site: Secondary | ICD-10-CM

## 2015-06-07 LAB — COMPREHENSIVE METABOLIC PANEL
ALK PHOS: 137 U/L — AB (ref 39–117)
ALT: 70 U/L — ABNORMAL HIGH (ref 0–35)
AST: 44 U/L — ABNORMAL HIGH (ref 0–37)
Albumin: 3.7 g/dL (ref 3.5–5.2)
BUN: 13 mg/dL (ref 6–23)
CHLORIDE: 101 meq/L (ref 96–112)
CO2: 27 mEq/L (ref 19–32)
Calcium: 10.3 mg/dL (ref 8.4–10.5)
Creatinine, Ser: 0.96 mg/dL (ref 0.40–1.20)
GFR: 59.76 mL/min — AB (ref >60.00)
GLUCOSE: 134 mg/dL — AB (ref 70–99)
POTASSIUM: 3.7 meq/L (ref 3.5–5.1)
SODIUM: 135 meq/L (ref 135–145)
TOTAL PROTEIN: 7.1 g/dL (ref 6.0–8.3)
Total Bilirubin: 1.2 mg/dL (ref 0.2–1.2)

## 2015-06-07 LAB — CBC WITH DIFFERENTIAL/PLATELET
BASOS ABS: 0.1 10*3/uL (ref 0.0–0.1)
BASOS PCT: 0.4 % (ref 0.0–3.0)
EOS ABS: 0 10*3/uL (ref 0.0–0.7)
Eosinophils Relative: 0.1 % (ref 0.0–5.0)
HEMATOCRIT: 39.2 % (ref 36.0–46.0)
Hemoglobin: 12.6 g/dL (ref 12.0–15.0)
LYMPHS ABS: 1.4 10*3/uL (ref 0.7–4.0)
LYMPHS PCT: 8.1 % — AB (ref 12.0–46.0)
MCHC: 32.2 g/dL (ref 30.0–36.0)
MCV: 84.3 fl (ref 78.0–100.0)
MONO ABS: 1.9 10*3/uL — AB (ref 0.1–1.0)
Monocytes Relative: 10.9 % (ref 3.0–12.0)
NEUTROS ABS: 13.7 10*3/uL — AB (ref 1.4–7.7)
NEUTROS PCT: 80.5 % — AB (ref 43.0–77.0)
PLATELETS: 430 10*3/uL — AB (ref 150.0–400.0)
RBC: 4.65 Mil/uL (ref 3.87–5.11)
RDW: 14.4 % (ref 11.5–15.5)
WBC: 17 10*3/uL — ABNORMAL HIGH (ref 4.0–10.5)

## 2015-06-07 LAB — T4, FREE: FREE T4: 0.71 ng/dL (ref 0.60–1.60)

## 2015-06-07 LAB — SEDIMENTATION RATE: Sed Rate: 58 mm/hr — ABNORMAL HIGH (ref 0–22)

## 2015-06-07 LAB — CK: Total CK: 23 U/L (ref 7–177)

## 2015-06-07 NOTE — Progress Notes (Signed)
Pre visit review using our clinic review tool, if applicable. No additional management support is needed unless otherwise documented below in the visit note. 

## 2015-06-07 NOTE — Assessment & Plan Note (Addendum)
Mostly seemed knee and back but now generalized today No respiratory infection so not flu---but ??other viral Med reaction to some thing (?risperidone)---but not new ?atypical related to calcium? Thyroid not controlled? May just need to wait it out  Will check labs Continue tylenol Baclofen only if symptomatic help

## 2015-06-07 NOTE — Assessment & Plan Note (Signed)
Will check labs If hyperpara --will send to endo at [facility]

## 2015-06-07 NOTE — Telephone Encounter (Signed)
Spoke with patient and she stated that she is having body aches like she has the Flu. I told her that she might need to be seen so she can be tested. She is going to call the [facility] office to try to get in.

## 2015-06-07 NOTE — Progress Notes (Signed)
Subjective:    Patient ID: Wendy Murray, female    DOB: April 24, 1936, 79 y.o.   MRN: [id number]  HPI Here for ongoing pain Started ~3 days ago Yesterday seen for knee and back pain---note reviewed This morning--- "everything hurts" Pain just reaching down, hurts when sits Needs walker now to walk  May have had fever--- 100.1 this AM No respiratory symptoms-- like runny nose, cough, sore throat Aching all over  Tried the baclofen---didn't seem to help as worse this AM Heat not helpful  Current Outpatient Prescriptions on File Prior to Visit  Medication Sig Dispense Refill  . acetaminophen (TYLENOL) 500 MG tablet Take 500 mg by mouth every 6 (six) hours as needed.    . Aspirin-Acetaminophen-Caffeine (EXCEDRIN PO) Take by mouth as needed.    . baclofen (LIORESAL) 10 MG tablet Take 1 tablet (10 mg total) by mouth 3 (three) times daily as needed for muscle spasms. 30 each 0  . clonazePAM (KLONOPIN) 1 MG tablet     . levothyroxine (LEVOTHROID) 75 MCG tablet Take 1 tablet (75 mcg total) by mouth daily before breakfast. 90 tablet 3  . liothyronine (CYTOMEL) 5 MCG tablet TAKE TWO TABLETS BY MOUTH ONCE DAILY 180 tablet 0  . mirtazapine (REMERON) 15 MG tablet Take 15 mg by mouth at bedtime.     . Multiple Vitamin (MULTIVITAMIN) tablet Take 1 tablet by mouth daily.    . polyethylene glycol powder (MIRALAX) powder Take 1 Container by mouth daily.    . Probiotic Product (PROBIOTIC DAILY PO) Take by mouth daily.    . Psyllium (METAMUCIL PO) Take by mouth daily.    . risperiDONE (RISPERDAL) 1 MG tablet Take 1-2 by mouth daily as needed    . venlafaxine XR (EFFEXOR-XR) 150 MG 24 hr capsule Take 150 mg by mouth daily with breakfast. Along with 75mg     . venlafaxine XR (EFFEXOR-XR) 75 MG 24 hr capsule Take 75 mg by mouth daily with breakfast. Along with 150mg      No current facility-administered medications on file prior to visit.    No Known Allergies  Past Medical History  Diagnosis  Date  . Hyperlipidemia   . Recurrent major depression ([place])   . Hypothyroidism   . DVT (deep venous thrombosis) ([place]) 2012    after TKR  . Osteoarthritis, knee   . IBS (irritable bowel syndrome)   . Diverticulosis     Past Surgical History  Procedure Laterality Date  . Meniscus repair Left 11/11  . Total knee arthroplasty Left 2012  . Lumbar fusion  1993  . Cholecystectomy  1998  . Abdominal hysterectomy  1991  . Cataract extraction w/ intraocular lens  implant, bilateral  2014  . Dexa  3/09    Normal  . Hammer toe surgery  5/16    Dr Barkley Bruns    Family History  Problem Relation Age of Onset  . Cancer Mother     colon  . Depression Mother   . Cancer Father   . Depression Sister   . Hypertension Neg Hx   . Heart disease Neg Hx   . Diabetes Neg Hx     Social History   Social History  . Marital Status: Married    Spouse Name: N/A  . Number of Children: 3  . Years of Education: N/A   Occupational History  . 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 Smoking status: Former Smoker  Quit date: 06/16/1981  . Smokeless tobacco: Never Used     Comment: quit 1983  . Alcohol Use: 0.0 oz/week    0 Standard drinks or equivalent per week     Comment: rarely  . Drug Use: No  . Sexual Activity: Not on file   Other Topics Concern  . Not on file   Social History Narrative   [place] in [place]      Has living will    Husband, then daughter Joycelyn Schmid, have health care POA   Has DNR ---order rewritten   No feeding tube if cognitively unaware   Review of Systems No rash Bowels are okay Feels some weakness in extremities Omeprazole has helped stomach    Objective:   Physical Exam  Constitutional: No distress.  upset with what is going on but no true distress  Neck: Normal range of motion. Neck supple. No thyromegaly present.  Cardiovascular: Normal rate and regular rhythm.  Exam reveals no  gallop.   No murmur heard. Pulmonary/Chest: Effort normal and breath sounds normal. No respiratory distress. She has no wheezes. She has no rales.  Abdominal: Soft. There is no tenderness.  Musculoskeletal: She exhibits no edema.  No joint swelling or tenderness   Lymphadenopathy:    She has no cervical adenopathy.  Neurological:  3+/5 strength in legs 4/5 in arms Normal tone  Skin: No rash noted.  Psychiatric: She has a normal mood and affect. Her behavior is normal.          Assessment & Plan:

## 2015-06-07 NOTE — Telephone Encounter (Signed)
Patient has requested a medication, to help with pain. Patient stated that she was seen by Dr. Caryl Bis on 06/06/15, and he was to refer her to a specialist.  Patient requested to follow up with Dr. Jolyn Nap this issue.Please advise

## 2015-06-07 NOTE — Telephone Encounter (Signed)
Please advise 

## 2015-06-07 NOTE — Telephone Encounter (Signed)
Patient needs to follow up with PCP regarding pain medication. She can use tylenol 1000 mg TID as needed for pain.

## 2015-06-08 ENCOUNTER — Telehealth: Payer: Self-pay | Admitting: Internal Medicine

## 2015-06-08 ENCOUNTER — Encounter: Payer: Self-pay | Admitting: *Deleted

## 2015-06-08 DIAGNOSIS — S39012A Strain of muscle, fascia and tendon of lower back, initial encounter: Secondary | ICD-10-CM | POA: Insufficient documentation

## 2015-06-08 LAB — PARATHYROID HORMONE, INTACT (NO CA): PTH: 129 pg/mL — ABNORMAL HIGH (ref 14–64)

## 2015-06-08 NOTE — Telephone Encounter (Signed)
[place]    --------------------------------------------------------------------------------   Patient Name: Wendy Murray  Gender: Female  DOB: [birth year]   Age: 79 Y 10 M 24 D  Return Phone Number: [phone] (Primary)  Address:     City/State/Zip:  [place]     Client [facility] - Client  Client [place] [facility] Day  Physician [name]   Contact Type Call  Call Type Triage / [place] Name Laurey Arrow or Waunita Schooner ( spouse)   Relationship To Patient Self  Appointment Disposition EMR Appointment Not Necessary  Info pasted into Epic Yes  Return Phone Number [phone] (Primary)  Chief Complaint Pain - Generalized  Initial Comment Caller states: wife went to md white blood count was really high, dx with a viral infection, she is in a lot of pain.   PreDisposition Call Doctor       Nurse Assessment  Nurse: Lavera Guise, RN, Vaughan Basta Date/Time Eilene Ghazi Time): 06/08/2015 4:47:57 PM  Confirm and document reason for call. If symptomatic, describe symptoms. ---Caller states: wife went to md white blood count was really high, dx with a viral infection, she is in a lot of pain. Pain is severe, in shoulders and throughout body.. in the bone.    Has the patient traveled out of the country within the last 30 days? ---No    Does the patient have any new or worsening symptoms? ---Yes    Will a triage be completed? ---Yes    Related visit to physician within the last 2 weeks? ---Yes    Does the PT have any chronic conditions? (i.e. diabetes, asthma, etc.) ---No    Is this a behavioral health or substance abuse call? ---No           Guidelines          Guideline Title Affirmed Question Affirmed Notes Nurse Date/Time (Eastern Time)  Muscle Aches and Body Pain Muscle aches and fever from a minor viral illness (e.g., common cold) (all triage  questions negative)    Kluth, RN, Vaughan Basta 06/08/2015 4:58:03 PM    Disp. Time Eilene Ghazi Time) Disposition Final User    06/08/2015 5:30:54 PM Send To RN Personal   Lavera Guise, RN, Vaughan Basta      06/08/2015 5:18:44 PM Home Care Yes Kluth, RN, Phineas Semen Understands: Yes  Disagree/Comply: Comply       Care Advice Given Per Guideline        HOME CARE: You should be able to treat this at home. * 100-102 F (37.8 - 38.9 C): Low-grade fevers and may help body fight infection. ACETAMINOPHEN (E.G., TYLENOL): * Acetaminophen is thought to be safer than ibuprofen or naproxen for people over 58 years old. Acetaminophen is in many OTC and prescription medicines. It might be in more than one medicine that you are taking. You need to be careful and not take an overdose. An acetaminophen overdose can hurt the liver. CARE ADVICE given per Muscle Aches and Body Pain (Adult) guideline. * You become worse. * Fever lasts over 3 days (72 hours) * Fever goes above 104 F (40 C) and you are unable to get it down CALL BACK IF: * Take 650 mg (two 325 mg pills) by mouth every 4-6 hours as needed. Each Regular Strength Tylenol pill has 325 mg of acetaminophen. The  most you should take each day is 3,250 mg (10 Regular Strength pills a day). EXTRA NOTES: * For muscle aches, headaches, or moderate fever (over 101 degrees F) (38.9 C) use acetaminophen every 4 hours. TREATMENT FOR ASSOCIATED SYMPTOMS OF COLDS:    After Care Instructions Given

## 2015-06-08 NOTE — Telephone Encounter (Signed)
See result notes, spoke with patient and advised results, letter mailed to patients home address with results.

## 2015-06-08 NOTE — Assessment & Plan Note (Addendum)
Suspect knee pain is likely related to arthritis versus degenerative meniscal issue. I discussed obtaining x-rays versus referral to orthopedics as she has previously had a knee replacement in the other knee versus steroid injection. Patient notes steroid injection has not helped in the past. She opted for referral to orthopedics. This referral was placed. Patient will continue to monitor. Tylenol for discomfort. Given return precautions.

## 2015-06-08 NOTE — Assessment & Plan Note (Signed)
History and exam most consistent with low back strain. She is neurologically intact in lower extremities. She has no red flags. Discussed heat. Tylenol for discomfort. We'll give a trial of baclofen as a muscle relaxer. She's given return precautions.

## 2015-06-08 NOTE — Telephone Encounter (Signed)
Pt called, stating she got a call from Dr. Alla German medical assistant. You can reach her at [phone].

## 2015-06-08 NOTE — Progress Notes (Signed)
Patient ID: Wendy Murray, female   DOB: 23-Nov-1935, 79 y.o.   MRN: [id number]  Wendy Rumps, MD Phone: [phone]  Wendy Murray is a 79 y.o. female who presents today for same-day visit:  Patient notes since Sunday her right knee has been sore. She notes she woke up that morning and the knee was sore. She notes it had been hard for her to walk on it after a couple of days, though this is improved and she is getting around with a walker fairly easily. She notes she does not typically use a walker though. She denies injury, redness, warmth, fevers, and swelling. She notes she is having difficulty straightening the whole leg. She is walking with a walker and is able to move around well with this. She notes arthritis in her knee. Has a history of a left knee replacement. Patient then notes on Tuesday her right low back started to hurt. Has a minimal ache when sitting still. Notes it will spasm if she moves incorrectly. Has a history of lumbar spine fusion. Notes the discomfort feels as though to spasm in her muscles. No radiation. No numbness or weakness. No saddle anesthesia, bowel or bladder incontinence, fever, or history of cancer.  PMH: Former smoker.   ROS see history of present illness  Objective  Physical Exam Filed Vitals:   06/06/15 1432  BP: 116/74  Pulse: 99  Temp: 97.6 F (36.4 C)    BP Readings from Last 3 Encounters:  06/07/15 120/68  06/06/15 116/74  01/30/15 108/70   Wt Readings from Last 3 Encounters:  06/07/15 161 lb (73.029 kg)  06/06/15 165 lb 8 oz (75.07 kg)  01/30/15 161 lb (73.029 kg)    Physical Exam  Constitutional: She is well-developed, well-nourished, and in no distress.  HENT:  Head: Normocephalic and atraumatic.  Cardiovascular: Normal rate, regular rhythm and normal heart sounds.   Pulmonary/Chest: Effort normal and breath sounds normal.  Musculoskeletal:  Midline scar in the lumbar spine, no midline tenderness of the spine or  neck, minimal right paraspinous muscle tenderness with spasm, no erythema or swelling, left knee status post knee replacement, no tenderness, swelling, erythema, or warmth of the left knee, right knee with no joint line tenderness, swelling, erythema, warmth, or tenderness, right knee with positive McMurray's, can only straighten to about 170  Neurological: She is alert.  5 out of 5 strength in bilateral quads, hamstrings, plantar flexion, dorsiflexion, sensation to light touch intact in bilateral lower extremities, 2+ patellar reflexes  Skin: Skin is warm and dry. She is not diaphoretic.     Assessment/Plan: Please see individual problem list.  Osteoarthritis, knee Suspect knee pain is likely related to arthritis versus degenerative meniscal issue. I discussed obtaining x-rays versus referral to orthopedics as she has previously had a knee replacement in the other knee versus steroid injection. Patient notes steroid injection has not helped in the past. She opted for referral to orthopedics. This referral was placed. Patient will continue to monitor. Tylenol for discomfort. Given return precautions.  Low back strain History and exam most consistent with low back strain. She is neurologically intact in lower extremities. She has no red flags. Discussed heat. Tylenol for discomfort. We'll give a trial of baclofen as a muscle relaxer. She's given return precautions.    Orders Placed This Encounter  Procedures  . Ambulatory referral to Orthopedic Surgery    Referral Priority:  Routine    Referral Type:  Surgical    Referral Reason:  Specialty Services Required    Requested Specialty:  Orthopedic Surgery    Number of Visits Requested:  1    Meds ordered this encounter  Medications  . baclofen (LIORESAL) 10 MG tablet    Sig: Take 1 tablet (10 mg total) by mouth 3 (three) times daily as needed for muscle spasms.    Dispense:  30 each    Refill:  0    Dragon voice recognition software  was used during the dictation process of this note. If any phrases or words seem inappropriate it is likely secondary to the translation process being inefficient.  Wendy Murray

## 2015-06-10 ENCOUNTER — Inpatient Hospital Stay
Admission: EM | Admit: 2015-06-10 | Discharge: 2015-06-16 | DRG: 554 | Disposition: A | Discharge status: Home Health | Payer: Medicare Other | Attending: Internal Medicine | Admitting: Internal Medicine

## 2015-06-10 ENCOUNTER — Emergency Department: Payer: Medicare Other

## 2015-06-10 DIAGNOSIS — E079 Disorder of thyroid, unspecified: Secondary | ICD-10-CM | POA: Diagnosis present

## 2015-06-10 DIAGNOSIS — J40 Bronchitis, not specified as acute or chronic: Secondary | ICD-10-CM | POA: Diagnosis present

## 2015-06-10 DIAGNOSIS — K589 Irritable bowel syndrome without diarrhea: Secondary | ICD-10-CM | POA: Diagnosis present

## 2015-06-10 DIAGNOSIS — I10 Essential (primary) hypertension: Secondary | ICD-10-CM | POA: Diagnosis present

## 2015-06-10 DIAGNOSIS — R000 Tachycardia, unspecified: Secondary | ICD-10-CM | POA: Diagnosis not present

## 2015-06-10 DIAGNOSIS — D72829 Elevated white blood cell count, unspecified: Secondary | ICD-10-CM | POA: Diagnosis not present

## 2015-06-10 DIAGNOSIS — Z87891 Personal history of nicotine dependence: Secondary | ICD-10-CM | POA: Diagnosis not present

## 2015-06-10 DIAGNOSIS — M1120 Other chondrocalcinosis, unspecified site: Secondary | ICD-10-CM | POA: Diagnosis not present

## 2015-06-10 DIAGNOSIS — E039 Hypothyroidism, unspecified: Secondary | ICD-10-CM | POA: Diagnosis present

## 2015-06-10 DIAGNOSIS — R6510 Systemic inflammatory response syndrome (SIRS) of non-infectious origin without acute organ dysfunction: Secondary | ICD-10-CM | POA: Diagnosis not present

## 2015-06-10 DIAGNOSIS — F329 Major depressive disorder, single episode, unspecified: Secondary | ICD-10-CM | POA: Diagnosis not present

## 2015-06-10 DIAGNOSIS — F339 Major depressive disorder, recurrent, unspecified: Secondary | ICD-10-CM | POA: Diagnosis present

## 2015-06-10 DIAGNOSIS — M1711 Unilateral primary osteoarthritis, right knee: Secondary | ICD-10-CM | POA: Diagnosis present

## 2015-06-10 DIAGNOSIS — M542 Cervicalgia: Secondary | ICD-10-CM

## 2015-06-10 DIAGNOSIS — M791 Myalgia: Secondary | ICD-10-CM | POA: Diagnosis not present

## 2015-06-10 DIAGNOSIS — M179 Osteoarthritis of knee, unspecified: Secondary | ICD-10-CM | POA: Diagnosis present

## 2015-06-10 DIAGNOSIS — M25569 Pain in unspecified knee: Secondary | ICD-10-CM

## 2015-06-10 DIAGNOSIS — B9789 Other viral agents as the cause of diseases classified elsewhere: Secondary | ICD-10-CM | POA: Diagnosis present

## 2015-06-10 DIAGNOSIS — E213 Hyperparathyroidism, unspecified: Secondary | ICD-10-CM | POA: Diagnosis present

## 2015-06-10 DIAGNOSIS — R509 Fever, unspecified: Secondary | ICD-10-CM

## 2015-06-10 DIAGNOSIS — B349 Viral infection, unspecified: Secondary | ICD-10-CM | POA: Diagnosis not present

## 2015-06-10 DIAGNOSIS — M064 Inflammatory polyarthropathy: Principal | ICD-10-CM | POA: Diagnosis present

## 2015-06-10 DIAGNOSIS — M25461 Effusion, right knee: Secondary | ICD-10-CM | POA: Diagnosis not present

## 2015-06-10 DIAGNOSIS — M549 Dorsalgia, unspecified: Secondary | ICD-10-CM | POA: Diagnosis not present

## 2015-06-10 DIAGNOSIS — Z86718 Personal history of other venous thrombosis and embolism: Secondary | ICD-10-CM

## 2015-06-10 DIAGNOSIS — E785 Hyperlipidemia, unspecified: Secondary | ICD-10-CM | POA: Diagnosis present

## 2015-06-10 DIAGNOSIS — A4189 Other specified sepsis: Secondary | ICD-10-CM | POA: Diagnosis present

## 2015-06-10 DIAGNOSIS — M069 Rheumatoid arthritis, unspecified: Secondary | ICD-10-CM | POA: Diagnosis not present

## 2015-06-10 DIAGNOSIS — M11261 Other chondrocalcinosis, right knee: Secondary | ICD-10-CM | POA: Diagnosis not present

## 2015-06-10 DIAGNOSIS — M50222 Other cervical disc displacement at C5-C6 level: Secondary | ICD-10-CM | POA: Diagnosis not present

## 2015-06-10 DIAGNOSIS — Z79899 Other long term (current) drug therapy: Secondary | ICD-10-CM | POA: Diagnosis not present

## 2015-06-10 DIAGNOSIS — M47812 Spondylosis without myelopathy or radiculopathy, cervical region: Secondary | ICD-10-CM | POA: Diagnosis not present

## 2015-06-10 DIAGNOSIS — R079 Chest pain, unspecified: Secondary | ICD-10-CM | POA: Diagnosis not present

## 2015-06-10 DIAGNOSIS — R7989 Other specified abnormal findings of blood chemistry: Secondary | ICD-10-CM | POA: Diagnosis not present

## 2015-06-10 DIAGNOSIS — M25561 Pain in right knee: Secondary | ICD-10-CM | POA: Diagnosis not present

## 2015-06-10 DIAGNOSIS — A419 Sepsis, unspecified organism: Secondary | ICD-10-CM | POA: Diagnosis not present

## 2015-06-10 DIAGNOSIS — R945 Abnormal results of liver function studies: Secondary | ICD-10-CM

## 2015-06-10 LAB — LACTIC ACID, PLASMA
Lactic Acid, Venous: 1 mmol/L (ref 0.5–2.0)
Lactic Acid, Venous: 1.3 mmol/L (ref 0.5–2.0)

## 2015-06-10 LAB — URINALYSIS COMPLETE WITH MICROSCOPIC (ARMC ONLY)
Bacteria, UA: NONE SEEN
Bilirubin Urine: NEGATIVE
Glucose, UA: NEGATIVE mg/dL
KETONES UR: NEGATIVE mg/dL
Leukocytes, UA: NEGATIVE
Nitrite: NEGATIVE
PH: 6 (ref 5.0–8.0)
PROTEIN: NEGATIVE mg/dL
Specific Gravity, Urine: 1.006 (ref 1.005–1.030)

## 2015-06-10 LAB — CBC
HCT: 35 % (ref 35.0–47.0)
Hemoglobin: 11.5 g/dL — ABNORMAL LOW (ref 12.0–16.0)
MCH: 27 pg (ref 26.0–34.0)
MCHC: 32.8 g/dL (ref 32.0–36.0)
MCV: 82.6 fL (ref 80.0–100.0)
PLATELETS: 453 10*3/uL — AB (ref 150–440)
RBC: 4.23 MIL/uL (ref 3.80–5.20)
RDW: 14.1 % (ref 11.5–14.5)
WBC: 14.5 10*3/uL — ABNORMAL HIGH (ref 3.6–11.0)

## 2015-06-10 LAB — DIFFERENTIAL
Basophils Absolute: 0 10*3/uL (ref 0–0.1)
Basophils Relative: 0 %
Eosinophils Absolute: 0 10*3/uL (ref 0–0.7)
Eosinophils Relative: 0 %
Lymphocytes Relative: 5 %
Lymphs Abs: 0.7 10*3/uL — ABNORMAL LOW (ref 1.0–3.6)
Monocytes Absolute: 2 10*3/uL — ABNORMAL HIGH (ref 0.2–0.9)
Monocytes Relative: 14 %
Neutro Abs: 12.1 10*3/uL — ABNORMAL HIGH (ref 1.4–6.5)
Neutrophils Relative %: 81 %

## 2015-06-10 LAB — COMPREHENSIVE METABOLIC PANEL
ALT: 103 U/L — ABNORMAL HIGH (ref 14–54)
AST: 83 U/L — ABNORMAL HIGH (ref 15–41)
Albumin: 3.3 g/dL — ABNORMAL LOW (ref 3.5–5.0)
Alkaline Phosphatase: 203 U/L — ABNORMAL HIGH (ref 38–126)
Anion gap: 8 (ref 5–15)
BILIRUBIN TOTAL: 0.9 mg/dL (ref 0.3–1.2)
BUN: 12 mg/dL (ref 6–20)
CHLORIDE: 100 mmol/L — AB (ref 101–111)
CO2: 25 mmol/L (ref 22–32)
CREATININE: 0.8 mg/dL (ref 0.44–1.00)
Calcium: 10 mg/dL (ref 8.9–10.3)
Glucose, Bld: 139 mg/dL — ABNORMAL HIGH (ref 65–99)
POTASSIUM: 3.8 mmol/L (ref 3.5–5.1)
Sodium: 133 mmol/L — ABNORMAL LOW (ref 135–145)
TOTAL PROTEIN: 7.7 g/dL (ref 6.5–8.1)

## 2015-06-10 LAB — INFLUENZA PANEL BY PCR (TYPE A & B)
H1N1FLUPCR: NOT DETECTED
INFLBPCR: NEGATIVE
Influenza A By PCR: NEGATIVE

## 2015-06-10 LAB — HEMOGLOBIN A1C: Hgb A1c MFr Bld: 6.1 % — ABNORMAL HIGH (ref 4.0–6.0)

## 2015-06-10 LAB — TSH
TSH: 3.519 u[IU]/mL (ref 0.350–4.500)
TSH: 3.395 u[IU]/mL (ref 0.350–4.500)

## 2015-06-10 LAB — TROPONIN I

## 2015-06-10 LAB — CK: CK TOTAL: 17 U/L — AB (ref 38–234)

## 2015-06-10 MED ORDER — RISAQUAD PO CAPS
1.0000 | ORAL_CAPSULE | Freq: Every day | ORAL | Status: DC
  Administered 2015-06-10 – 2015-06-16 (×7): 1 via ORAL
  Filled 2015-06-10 (×7): qty 1

## 2015-06-10 MED ORDER — ONDANSETRON HCL 4 MG/2ML IJ SOLN: 4.0000 mg | Freq: Four times a day (QID) | INTRAMUSCULAR | Status: DC | PRN

## 2015-06-10 MED ORDER — CLONAZEPAM 1 MG PO TABS
1.0000 mg | ORAL_TABLET | Freq: Every day | ORAL | Status: DC
  Administered 2015-06-10 – 2015-06-15 (×6): 1 mg via ORAL
  Filled 2015-06-10 (×6): qty 1

## 2015-06-10 MED ORDER — VENLAFAXINE HCL ER 75 MG PO CP24
75.0000 mg | ORAL_CAPSULE | Freq: Every day | ORAL | Status: DC
  Administered 2015-06-10 – 2015-06-16 (×7): 75 mg via ORAL
  Filled 2015-06-10 (×7): qty 1

## 2015-06-10 MED ORDER — KETOROLAC TROMETHAMINE 30 MG/ML IJ SOLN
15.0000 mg | Freq: Once | INTRAMUSCULAR | Status: AC
  Administered 2015-06-10: 15 mg via INTRAVENOUS
  Filled 2015-06-10: qty 1

## 2015-06-10 MED ORDER — MORPHINE SULFATE (PF) 2 MG/ML IV SOLN: 2.0000 mg | INTRAVENOUS | Status: DC | PRN

## 2015-06-10 MED ORDER — MIRTAZAPINE 15 MG PO TABS
15.0000 mg | ORAL_TABLET | Freq: Every day | ORAL | Status: DC
  Administered 2015-06-10 – 2015-06-15 (×6): 15 mg via ORAL
  Filled 2015-06-10 (×6): qty 1

## 2015-06-10 MED ORDER — ACETAMINOPHEN 325 MG PO TABS
650.0000 mg | ORAL_TABLET | Freq: Four times a day (QID) | ORAL | Status: DC | PRN
  Administered 2015-06-10 – 2015-06-12 (×4): 650 mg via ORAL
  Filled 2015-06-10 (×5): qty 2

## 2015-06-10 MED ORDER — RISPERIDONE 0.5 MG PO TABS
1.0000 mg | ORAL_TABLET | Freq: Every day | ORAL | Status: DC
  Administered 2015-06-10 – 2015-06-15 (×6): 1 mg via ORAL
  Filled 2015-06-10 (×6): qty 2

## 2015-06-10 MED ORDER — VITAMIN D 1000 UNITS PO TABS
1000.0000 [IU] | ORAL_TABLET | Freq: Every day | ORAL | Status: DC
  Administered 2015-06-10 – 2015-06-16 (×7): 1000 [IU] via ORAL
  Filled 2015-06-10 (×7): qty 1

## 2015-06-10 MED ORDER — SODIUM CHLORIDE 0.9 % IV SOLN
INTRAVENOUS | Status: DC
  Administered 2015-06-10 – 2015-06-11 (×4): via INTRAVENOUS

## 2015-06-10 MED ORDER — ACETAMINOPHEN 650 MG RE SUPP: 650.0000 mg | Freq: Four times a day (QID) | RECTAL | Status: DC | PRN

## 2015-06-10 MED ORDER — VENLAFAXINE HCL ER 75 MG PO CP24
150.0000 mg | ORAL_CAPSULE | Freq: Every day | ORAL | Status: DC
  Administered 2015-06-10 – 2015-06-16 (×7): 150 mg via ORAL
  Filled 2015-06-10 (×7): qty 2

## 2015-06-10 MED ORDER — ONDANSETRON HCL 4 MG PO TABS: 4.0000 mg | ORAL_TABLET | Freq: Four times a day (QID) | ORAL | Status: DC | PRN

## 2015-06-10 MED ORDER — SODIUM CHLORIDE 0.9 % IJ SOLN
3.0000 mL | Freq: Two times a day (BID) | INTRAMUSCULAR | Status: DC
  Administered 2015-06-13 – 2015-06-16 (×6): 3 mL via INTRAVENOUS

## 2015-06-10 MED ORDER — ADULT MULTIVITAMIN W/MINERALS CH
1.0000 | ORAL_TABLET | Freq: Every day | ORAL | Status: DC
  Administered 2015-06-10 – 2015-06-16 (×7): 1 via ORAL
  Filled 2015-06-10 (×7): qty 1

## 2015-06-10 MED ORDER — HEPARIN SODIUM (PORCINE) 5000 UNIT/ML IJ SOLN
5000.0000 [IU] | Freq: Three times a day (TID) | INTRAMUSCULAR | Status: DC
  Administered 2015-06-10 – 2015-06-16 (×18): 5000 [IU] via SUBCUTANEOUS
  Filled 2015-06-10 (×18): qty 1

## 2015-06-10 MED ORDER — DOCUSATE SODIUM 100 MG PO CAPS
100.0000 mg | ORAL_CAPSULE | Freq: Two times a day (BID) | ORAL | Status: DC
  Administered 2015-06-10 – 2015-06-16 (×12): 100 mg via ORAL
  Filled 2015-06-10 (×13): qty 1

## 2015-06-10 MED ORDER — SODIUM CHLORIDE 0.9 % IV SOLN
1000.0000 mL | Freq: Once | INTRAVENOUS | Status: AC
  Administered 2015-06-10: 1000 mL via INTRAVENOUS

## 2015-06-10 NOTE — ED Provider Notes (Signed)
[facility] Emergency Department Provider Note  ____________________________________________  Time seen: On arrival  I have reviewed the triage vital signs and the nursing notes.   HISTORY  Chief Complaint Muscle Pain and Weakness    HPI Wendy Murray is a 79 y.o. female who presents with 5 days of worsening body aches with fevers. Patient reports 5 days ago she began developing increasing pain in her lower extremities which then progressed to her whole body. She saw her primary care physician 3 days ago and felt this was a viral illness and recommended supportive care. She notes her symptoms worsened. Occasionally she has a dry cough but denies shortness of breath. She has had fevers of 101 at home. Normal stools. No dysuria. No neck pain. No rash. She does complain of right knee pain but attributes this to arthritis and denies any new redness or swelling. She did receive a flu shot this year     Past Medical History  Diagnosis Date  . Hyperlipidemia   . Recurrent major depression ([place])   . Hypothyroidism   . DVT (deep venous thrombosis) ([place]) 2012    after TKR  . Osteoarthritis, knee   . IBS (irritable bowel syndrome)   . Diverticulosis     Patient Active Problem List   Diagnosis Date Noted  . Low back strain 06/08/2015  . Myalgia 06/07/2015  . Hypercalcemia 06/07/2015  . Abdominal pain, epigastric 01/30/2015  . Chronic daily headache 12/13/2014  . Advance directive discussed with patient 12/13/2014  . Routine general medical examination at a health care facility 12/12/2013  . Hyperlipidemia   . Recurrent major depression ([place])   . Hypothyroidism   . Osteoarthritis, knee   . IBS (irritable bowel syndrome)     Past Surgical History  Procedure Laterality Date  . Meniscus repair Left 11/11  . Total knee arthroplasty Left 2012  . Lumbar fusion  1993  . Cholecystectomy  1998  . Abdominal hysterectomy  1991  . Cataract extraction w/  intraocular lens  implant, bilateral  2014  . Dexa  3/09    Normal  . Hammer toe surgery  5/16    Dr Barkley Bruns    Current Outpatient Rx  Name  Route  Sig  Dispense  Refill  . liothyronine (CYTOMEL) 5 MCG tablet   Oral   Take 10 mcg by mouth daily.         Marland Kitchen acetaminophen (TYLENOL) 500 MG tablet   Oral   Take 500 mg by mouth every 6 (six) hours as needed.         . Aspirin-Acetaminophen-Caffeine (EXCEDRIN PO)   Oral   Take by mouth as needed.         . baclofen (LIORESAL) 10 MG tablet   Oral   Take 1 tablet (10 mg total) by mouth 3 (three) times daily as needed for muscle spasms.   30 each   0   . clonazePAM (KLONOPIN) 1 MG tablet               . levothyroxine (LEVOTHROID) 75 MCG tablet   Oral   Take 1 tablet (75 mcg total) by mouth daily before breakfast.   90 tablet   3   . mirtazapine (REMERON) 15 MG tablet   Oral   Take 15 mg by mouth at bedtime.          . polyethylene glycol powder (MIRALAX) powder   Oral   Take 1 Container by mouth daily.         Marland Kitchen  Probiotic Product (PROBIOTIC DAILY PO)   Oral   Take by mouth daily.         . Psyllium (METAMUCIL PO)   Oral   Take by mouth daily.         . risperiDONE (RISPERDAL) 1 MG tablet      Take 1-2 by mouth daily as needed         . venlafaxine XR (EFFEXOR-XR) 150 MG 24 hr capsule   Oral   Take 150 mg by mouth daily with breakfast. Along with 75mg          . venlafaxine XR (EFFEXOR-XR) 75 MG 24 hr capsule   Oral   Take 75 mg by mouth daily with breakfast. Along with 150mg            Allergies Review of patient's allergies indicates no known allergies.  Family History  Problem Relation Age of Onset  . Cancer Mother     colon  . Depression Mother   . Cancer Father   . Depression Sister   . Hypertension Neg Hx   . Heart disease Neg Hx   . Diabetes Neg Hx     Social History Social History  Substance Use Topics  . Smoking status: Former Smoker    Quit date: 06/16/1981  .  Smokeless tobacco: Never Used     Comment: quit 1983  . Alcohol Use: 0.0 oz/week    0 Standard drinks or equivalent per week     Comment: rarely    Review of Systems  Constitutional: Positive for fever Eyes: Negative for visual changes. ENT: Negative for sore throat Cardiovascular: Negative for chest pain. Respiratory: Occasional cough Gastrointestinal: Negative for abdominal pain, vomiting and diarrhea. Genitourinary: Negative for dysuria. Musculoskeletal: Positive for diffuse myalgias Skin: Negative for rash. Neurological: Negative for headaches Psychiatric: No anxiety    ____________________________________________   PHYSICAL EXAM:  VITAL SIGNS: ED Triage Vitals  Enc Vitals Group     BP 06/10/15 0658 100/58 mmHg     Pulse Rate 06/10/15 0658 116     Resp 06/10/15 0658 22     Temp 06/10/15 0658 99.5 F (37.5 C)     Temp Source 06/10/15 0658 Oral     SpO2 06/10/15 0654 96 %     Weight 06/10/15 0658 160 lb (72.576 kg)     Height 06/10/15 0658 5\' 4"  (1.626 m)     Head Cir --      Peak Flow --      Pain Score --      Pain Loc --      Pain Edu? --      Excl. in [place]? --      Constitutional: Alert and oriented. Well appearing and in no distress. Pleasant and interactive Eyes: Conjunctivae are normal.  ENT   Head: Normocephalic and atraumatic.   Mouth/Throat: Mucous membranes are moist. Cardiovascular: Tachycardic, regular rhythm. Normal and symmetric distal pulses are present in all extremities. No murmurs, rubs, or gallops. Respiratory: Normal respiratory effort without tachypnea nor retractions. Breath sounds are clear and equal bilaterally.  Gastrointestinal: Soft and non-tender in all quadrants. No distention. There is no CVA tenderness. Genitourinary: deferred Musculoskeletal: Nontender with normal range of motion in all extremities. Mild right joint effusion to knee but no erythema. No lower extremity tenderness nor edema. Neurologic:  Normal speech and  language. No gross focal neurologic deficits are appreciated. Skin:  Skin is warm, dry and intact. No rash noted. Psychiatric: Mood and affect are  normal. Patient exhibits appropriate insight and judgment.  ____________________________________________    LABS (pertinent positives/negatives)  Labs Reviewed  CBC - Abnormal; Notable for the following:    WBC 14.5 (*)    Hemoglobin 11.5 (*)    Platelets 453 (*)    All other components within normal limits  COMPREHENSIVE METABOLIC PANEL - Abnormal; Notable for the following:    Sodium 133 (*)    Chloride 100 (*)    Glucose, Bld 139 (*)    Albumin 3.3 (*)    AST 83 (*)    ALT 103 (*)    Alkaline Phosphatase 203 (*)    All other components within normal limits  CK - Abnormal; Notable for the following:    Total CK 17 (*)    All other components within normal limits  URINALYSIS COMPLETEWITH MICROSCOPIC ([facility] ONLY) - Abnormal; Notable for the following:    Color, Urine YELLOW (*)    APPearance CLEAR (*)    Hgb urine dipstick 1+ (*)    Squamous Epithelial / LPF 0-5 (*)    All other components within normal limits  CULTURE, BLOOD (ROUTINE X 2)  CULTURE, BLOOD (ROUTINE X 2)  URINE CULTURE  TROPONIN I  LACTIC ACID, PLASMA  INFLUENZA PANEL BY PCR (TYPE A & B, H1N1)  LACTIC ACID, PLASMA    ____________________________________________   EKG  ED ECG REPORT I, Lavonia Drafts, the attending physician, personally viewed and interpreted this ECG.  Date: 06/10/2015 EKG Time: 7:12 AM Rate: 116 Rhythm: Sinus tachycardia QRS Axis: normal Intervals: normal ST/T Wave abnormalities: normal Conduction Disutrbances: none Narrative Interpretation: unremarkable   ____________________________________________    RADIOLOGY I have personally reviewed any xrays that were ordered on this patient: No acute distress  ____________________________________________   PROCEDURES  Procedure(s) performed: none  Critical Care performed:  none  ____________________________________________   INITIAL IMPRESSION / ASSESSMENT AND PLAN / ED COURSE  Pertinent labs & imaging results that were available during my care of the patient were reviewed by me and considered in my medical decision making (see chart for details).  Patient presents with tachycardia and mild tachypnea and fever. Unclear origin of myalgias, certainly influenza is a possibility but also pneumonia or UTI. Particular suspicious of pneumonia given her tachypnea and tachycardia and mild hypoxia. We'll obtain labs, chest x-ray, urine, blood cultures, lactic acid and reevaluate  ----------------------------------------- 11:27 AM on 06/10/2015 -----------------------------------------  Patient with continued tachycardia, tachypnea with borderline hypoxia. Elevated white blood cell count. Chest x-ray is limited by portable study but is negative. Patient does admit to feeling better but given continued abnormal vital signs I have recommended admission  ____________________________________________   FINAL CLINICAL IMPRESSION(S) / ED DIAGNOSES  Final diagnoses:  SIRS (systemic inflammatory response syndrome) (HCC)  Bronchitis     Lavonia Drafts, MD 06/10/15 1128

## 2015-06-10 NOTE — ED Notes (Signed)
Pt went to Dr Wednesday and Thursday and was told she had a viral infection with a high WBC count.  She was not prescribed any medications. Pt c/o generalized body pain and weakness. Pt denies chest pain, SOB, N/V/D, fever/chills.  Pt states: "it's just not getting any better"

## 2015-06-10 NOTE — H&P (Signed)
Wendy Murray is an 79 y.o. female.   Chief Complaint: Malaise HPI: The patient presents emergency department complaining of general malaise. She has been feeling unwell for at least a week with subjective fevers (MAXIMUM TEMPERATURE 100.66F). She was seen in her primary care doctor's office earlier this week for blood work. She reportedly had leukocytosis but still did not have signs or symptoms of bacterial infection. She presents emergency department today with worsening body aches. He denies nausea, vomiting or diarrhea. She has been eating well but admits to having significantly decreased energy. In the emergency department. Screening was negative but leukocytosis was present. Despite fluid resuscitation the patient continued to be tachycardic which prompted the emergency department physician to call for admission  Past Medical History  Diagnosis Date  . Hyperlipidemia   . Recurrent major depression ([place])   . Hypothyroidism   . DVT (deep venous thrombosis) ([place]) 2012    after TKR  . Osteoarthritis, knee   . IBS (irritable bowel syndrome)   . Diverticulosis     Past Surgical History  Procedure Laterality Date  . Meniscus repair Left 11/11  . Total knee arthroplasty Left 2012  . Lumbar fusion  1993  . Cholecystectomy  1998  . Abdominal hysterectomy  1991  . Cataract extraction w/ intraocular lens  implant, bilateral  2014  . Dexa  3/09    Normal  . Hammer toe surgery  5/16    Dr Barkley Bruns    Family History  Problem Relation Age of Onset  . Cancer Mother     colon  . Depression Mother   . Cancer Father   . Depression Sister   . Hypertension Neg Hx   . Heart disease Neg Hx   . Diabetes Neg Hx    Social History:  reports that she quit smoking about 34 years ago. She has never used smokeless tobacco. She reports that she drinks alcohol. She reports that she does not use illicit drugs.  Allergies: No Known Allergies  Medications Prior to Admission  Medication Sig  Dispense Refill  . acetaminophen (TYLENOL) 500 MG tablet Take 500 mg by mouth every 6 (six) hours as needed for mild pain or headache.     Marland Kitchen acidophilus (RISAQUAD) CAPS capsule Take 1 capsule by mouth daily.    . cholecalciferol (VITAMIN D) 1000 UNITS tablet Take 1,000 Units by mouth daily.    . clonazePAM (KLONOPIN) 1 MG tablet Take 1 mg by mouth at bedtime.     Marland Kitchen levothyroxine (LEVOTHROID) 75 MCG tablet Take 1 tablet (75 mcg total) by mouth daily before breakfast. 90 tablet 3  . liothyronine (CYTOMEL) 5 MCG tablet Take 10 mcg by mouth daily.    . mirtazapine (REMERON) 15 MG tablet Take 15 mg by mouth at bedtime.     . Multiple Vitamin (MULTIVITAMIN WITH MINERALS) TABS tablet Take 1 tablet by mouth daily.    . risperiDONE (RISPERDAL) 1 MG tablet Take 1 mg by mouth at bedtime.     Marland Kitchen venlafaxine XR (EFFEXOR-XR) 150 MG 24 hr capsule Take 150 mg by mouth daily. Pt takes with a 4m capsule.    . venlafaxine XR (EFFEXOR-XR) 75 MG 24 hr capsule Take 75 mg by mouth daily. Pt takes with a 1523mcapsule.    . baclofen (LIORESAL) 10 MG tablet Take 1 tablet (10 mg total) by mouth 3 (three) times daily as needed for muscle spasms. (Patient not taking: Reported on 06/10/2015) 30 each 0    Results  for orders placed or performed during the hospital encounter of 06/10/15 (from the past 48 hour(s))  Lactic acid, plasma     Status: None   Collection Time: 06/10/15  7:42 AM  Result Value Ref Range   Lactic Acid, Venous 1.0 0.5 - 2.0 mmol/L  CBC     Status: Abnormal   Collection Time: 06/10/15  7:45 AM  Result Value Ref Range   WBC 14.5 (H) 3.6 - 11.0 K/uL   RBC 4.23 3.80 - 5.20 MIL/uL   Hemoglobin 11.5 (L) 12.0 - 16.0 g/dL   HCT 35.0 35.0 - 47.0 %   MCV 82.6 80.0 - 100.0 fL   MCH 27.0 26.0 - 34.0 pg   MCHC 32.8 32.0 - 36.0 g/dL   RDW 14.1 11.5 - 14.5 %   Platelets 453 (H) 150 - 440 K/uL  Comprehensive metabolic panel     Status: Abnormal   Collection Time: 06/10/15  7:45 AM  Result Value Ref Range    Sodium 133 (L) 135 - 145 mmol/L   Potassium 3.8 3.5 - 5.1 mmol/L   Chloride 100 (L) 101 - 111 mmol/L   CO2 25 22 - 32 mmol/L   Glucose, Bld 139 (H) 65 - 99 mg/dL   BUN 12 6 - 20 mg/dL   Creatinine, Ser 0.80 0.44 - 1.00 mg/dL   Calcium 10.0 8.9 - 10.3 mg/dL   Total Protein 7.7 6.5 - 8.1 g/dL   Albumin 3.3 (L) 3.5 - 5.0 g/dL   AST 83 (H) 15 - 41 U/L   ALT 103 (H) 14 - 54 U/L   Alkaline Phosphatase 203 (H) 38 - 126 U/L   Total Bilirubin 0.9 0.3 - 1.2 mg/dL   GFR calc non Af Amer >60 >60 mL/min   GFR calc Af Amer >60 >60 mL/min    Comment: (NOTE) The eGFR has been calculated using the CKD EPI equation. This calculation has not been validated in all clinical situations. eGFR's persistently <60 mL/min signify possible Chronic Kidney Disease.    Anion gap 8 5 - 15  Troponin I     Status: None   Collection Time: 06/10/15  7:45 AM  Result Value Ref Range   Troponin I <0.03 <0.031 ng/mL    Comment:        NO INDICATION OF MYOCARDIAL INJURY.   CK     Status: Abnormal   Collection Time: 06/10/15  7:45 AM  Result Value Ref Range   Total CK 17 (L) 38 - 234 U/L  Influenza panel by PCR (type A & B, H1N1)     Status: None   Collection Time: 06/10/15  7:45 AM  Result Value Ref Range   Influenza A By PCR NEGATIVE NEGATIVE   Influenza B By PCR NEGATIVE NEGATIVE   H1N1 flu by pcr NOT DETECTED NOT DETECTED    Comment:        The Xpert Flu assay (FDA approved for nasal aspirates or washes and nasopharyngeal swab specimens), is intended as an aid in the diagnosis of influenza and should not be used as a sole basis for treatment.   TSH     Status: None   Collection Time: 06/10/15  7:45 AM  Result Value Ref Range   TSH 3.519 0.350 - 4.500 uIU/mL  Differential     Status: Abnormal   Collection Time: 06/10/15  7:45 AM  Result Value Ref Range   Neutrophils Relative % 81 %   Neutro Abs 12.1 (H) 1.4 -  6.5 K/uL   Lymphocytes Relative 5 %   Lymphs Abs 0.7 (L) 1.0 - 3.6 K/uL   Monocytes  Relative 14 %   Monocytes Absolute 2.0 (H) 0.2 - 0.9 K/uL   Eosinophils Relative 0 %   Eosinophils Absolute 0.0 0 - 0.7 K/uL   Basophils Relative 0 %   Basophils Absolute 0.0 0 - 0.1 K/uL  Urinalysis complete, with microscopic ([facility] only)     Status: Abnormal   Collection Time: 06/10/15 10:10 AM  Result Value Ref Range   Color, Urine YELLOW (A) YELLOW   APPearance CLEAR (A) CLEAR   Glucose, UA NEGATIVE NEGATIVE mg/dL   Bilirubin Urine NEGATIVE NEGATIVE   Ketones, ur NEGATIVE NEGATIVE mg/dL   Specific Gravity, Urine 1.006 1.005 - 1.030   Hgb urine dipstick 1+ (A) NEGATIVE   pH 6.0 5.0 - 8.0   Protein, ur NEGATIVE NEGATIVE mg/dL   Nitrite NEGATIVE NEGATIVE   Leukocytes, UA NEGATIVE NEGATIVE   RBC / HPF 0-5 0 - 5 RBC/hpf   WBC, UA 0-5 0 - 5 WBC/hpf   Bacteria, UA NONE SEEN NONE SEEN   Squamous Epithelial / LPF 0-5 (A) NONE SEEN   Mucous PRESENT   Lactic acid, plasma     Status: None   Collection Time: 06/10/15  3:35 PM  Result Value Ref Range   Lactic Acid, Venous 1.3 0.5 - 2.0 mmol/L  TSH     Status: None   Collection Time: 06/10/15  3:35 PM  Result Value Ref Range   TSH 3.395 0.350 - 4.500 uIU/mL  Hemoglobin A1c     Status: Abnormal   Collection Time: 06/10/15  3:35 PM  Result Value Ref Range   Hgb A1c MFr Bld 6.1 (H) 4.0 - 6.0 %   Dg Chest Portable 1 View  06/10/2015  CLINICAL DATA:  Generalized body pain in weakness. Patient had diagnosis of are viral infection last Wednesday EXAM: PORTABLE CHEST 1 VIEW COMPARISON:  None. FINDINGS: The heart size and mediastinal contours are within normal limits. Both lungs are clear. The visualized skeletal structures are unremarkable. IMPRESSION: No active cardiopulmonary disease. Electronically Signed   By: Abelardo Diesel M.D.   On: 06/10/2015 08:00    Review of Systems  Constitutional: Negative for fever and chills.  HENT: Negative for sore throat and tinnitus.   Eyes: Negative for blurred vision and redness.  Respiratory: Negative  for cough and shortness of breath.   Cardiovascular: Negative for chest pain, palpitations, orthopnea and PND.  Gastrointestinal: Negative for nausea, vomiting, abdominal pain and diarrhea.  Genitourinary: Negative for dysuria, urgency and frequency.  Musculoskeletal: Negative for myalgias and joint pain.  Skin: Negative for rash.       No lesions  Neurological: Negative for speech change, focal weakness and weakness.  Endo/Heme/Allergies: Does not bruise/bleed easily.       No temperature intolerance  Psychiatric/Behavioral: Negative for depression and suicidal ideas.    Blood pressure 132/56, pulse 92, temperature 98.7 F (37.1 C), temperature source Oral, resp. rate 18, height _0  (1.626 m), weight 72.576 kg (160 lb), SpO2 94 %. Physical Exam  Vitals reviewed. Constitutional: She is oriented to person, place, and time. She appears well-developed and well-nourished. No distress.  HENT:  Head: Normocephalic and atraumatic.  Mouth/Throat: Oropharynx is clear and moist.  Eyes: Conjunctivae and EOM are normal. Pupils are equal, round, and reactive to light. No scleral icterus.  Neck: Normal range of motion. Neck supple. No JVD present. No tracheal deviation  present. No thyromegaly present.  Cardiovascular: Normal rate, regular rhythm and normal heart sounds.  Exam reveals no gallop and no friction rub.   No murmur heard. Respiratory: Effort normal and breath sounds normal.  GI: Soft. Bowel sounds are normal. She exhibits no distension. There is no tenderness.  Genitourinary:  Deferred  Musculoskeletal: Normal range of motion. She exhibits no edema.  Lymphadenopathy:    She has no cervical adenopathy.  Neurological: She is alert and oriented to person, place, and time. No cranial nerve deficit. She exhibits normal muscle tone.  Skin: Skin is warm and dry. No rash noted. No erythema.  Psychiatric: She has a normal mood and affect. Her behavior is normal. Judgment and thought content  normal.     Assessment/Plan This is a 79 year old Caucasian female admitted for viral syndrome. 1. Viral syndrome: The patient appears to be mildly dehydrated and is suffering from general malaise secondary to likely viral infection. I will obtain a differential on her CBC to determine if there is occult bacterial infection versus sepsis response to viral syndrome. Continue to hydrate the patient with venous fluid. Tylenol as necessary for fever. 2. Tachycardia: Hydrate patient with intravenous fluid. Continue to monitor telemetry 3. Tyroid disease: The patient is on both hypo-and hyper thyroid medication. She reports that this has been the case for nearly 20 years. This does not seem to be accurate, and if so, may be part of her problem today. Will clarify with her pharmacy and or endocrinologist. If neither can be reached we will obtain extensive thyroid studies. TSH for now. 4. Depression: The patient is on multiple anti-depressants and mood stabilizers. Continue per home regimen. The patient denies suicidal ideation. 5. GI prophylaxis: None as the patient is not critically ill 6. DVT prophylaxis: Heparin The patient is a full code. Time spent on admission orders and patient care possibly 45 minutes  Harrie Foreman 06/10/2015, 7:40 PM

## 2015-06-10 NOTE — ED Notes (Signed)
Report given to Steven, RN.

## 2015-06-11 LAB — CBC
HEMATOCRIT: 30.9 % — AB (ref 35.0–47.0)
Hemoglobin: 10.2 g/dL — ABNORMAL LOW (ref 12.0–16.0)
MCH: 27.4 pg (ref 26.0–34.0)
MCHC: 33.1 g/dL (ref 32.0–36.0)
MCV: 82.7 fL (ref 80.0–100.0)
PLATELETS: 439 10*3/uL (ref 150–440)
RBC: 3.73 MIL/uL — ABNORMAL LOW (ref 3.80–5.20)
RDW: 14.4 % (ref 11.5–14.5)
WBC: 10.3 10*3/uL (ref 3.6–11.0)

## 2015-06-11 LAB — URINE CULTURE: Culture: NO GROWTH

## 2015-06-11 LAB — SEDIMENTATION RATE: Sed Rate: 105 mm/hr — ABNORMAL HIGH (ref 0–30)

## 2015-06-11 MED ORDER — OXYCODONE HCL 5 MG PO TABS
5.0000 mg | ORAL_TABLET | Freq: Four times a day (QID) | ORAL | Status: DC | PRN
  Administered 2015-06-11 – 2015-06-16 (×11): 5 mg via ORAL
  Filled 2015-06-11 (×12): qty 1

## 2015-06-11 MED ORDER — LIOTHYRONINE SODIUM 5 MCG PO TABS
10.0000 ug | ORAL_TABLET | Freq: Every day | ORAL | Status: DC
  Administered 2015-06-12 – 2015-06-16 (×5): 10 ug via ORAL
  Filled 2015-06-11 (×6): qty 2

## 2015-06-11 MED ORDER — LEVOTHYROXINE SODIUM 75 MCG PO TABS
75.0000 ug | ORAL_TABLET | Freq: Every day | ORAL | Status: DC
  Administered 2015-06-12 – 2015-06-16 (×5): 75 ug via ORAL
  Filled 2015-06-11 (×6): qty 1

## 2015-06-11 MED ORDER — VANCOMYCIN HCL IN DEXTROSE 1-5 GM/200ML-% IV SOLN
1000.0000 mg | Freq: Once | INTRAVENOUS | Status: AC
  Administered 2015-06-11: 1000 mg via INTRAVENOUS
  Filled 2015-06-11: qty 200

## 2015-06-11 MED ORDER — DEXTROSE 5 % IV SOLN
1.0000 g | Freq: Once | INTRAVENOUS | Status: AC
  Administered 2015-06-11: 1 g via INTRAVENOUS
  Filled 2015-06-11: qty 10

## 2015-06-11 MED ORDER — IBUPROFEN 400 MG PO TABS
600.0000 mg | ORAL_TABLET | Freq: Once | ORAL | Status: AC
  Administered 2015-06-11: 600 mg via ORAL
  Filled 2015-06-11: qty 2

## 2015-06-11 NOTE — Progress Notes (Signed)
Pts temp 101. MD Posey Pronto notified. No new orders given. Will continue to monitor.

## 2015-06-11 NOTE — Progress Notes (Signed)
Called by the floor to informing the patient still has fever and is very uncomfortable. Notes reviewed. ID consult pending as the patient does not have a discernible source of fever at this time. Initially she appeared to have a viral syndrome but differential obtained on CBC yesterday is more indicative of bacterial infection. Sedimentation rate is also increased (question osteomyelitis). The patient continues to report that her back hurts which we initially assumed was body aches but I may obtain x-rays of her back as well to determine if there is any area of lucency. I have ordered broad-spectrum antibiotics 1 as the patient has already been cultured. We'll await ID recommendations tomorrow.

## 2015-06-11 NOTE — Progress Notes (Signed)
rn spoke with dr Marcille Blanco  Re: 9 bt run svt(unsustained) . Pt asx. Vss. Also discussed with md re: tmax 101 and no iv antibiotic ordered. Sed rate increased.. md to order motrin for fever, and 1 dose of antibiotics. No further  orders

## 2015-06-11 NOTE — Progress Notes (Signed)
[place] at [facility]                                                                                                                                                                                            Patient Demographics   Wendy Murray, is a 79 y.o. female, DOB - 03-Oct-1935, [id number]  Admit date - 06/10/2015   Admitting Physician Harrie Foreman, MD  Outpatient Primary MD for the patient is [name], MD   LOS - 1  Subjective: Patient continues to complain of generalized aches and pains. Denies any cough, abdominal pain nausea vomiting or diarrhea denies any urinary symptoms.     Review of Systems:   CONSTITUTIONAL: No documented fever. Positive fatigue and weakness. No weight gain, no weight loss.  EYES: No blurry or double vision.  ENT: No tinnitus. No postnasal drip. No redness of the oropharynx.  RESPIRATORY: No cough, no wheeze, no hemoptysis. No dyspnea.  CARDIOVASCULAR: No chest pain. No orthopnea. No palpitations. No syncope.  GASTROINTESTINAL: No nausea, no vomiting or diarrhea. No abdominal pain. No melena or hematochezia.  GENITOURINARY: No dysuria or hematuria.  ENDOCRINE: No polyuria or nocturia. No heat or cold intolerance.  HEMATOLOGY: No anemia. No bruising. No bleeding.  INTEGUMENTARY: No rashes. No lesions.  MUSCULOSKELETAL: No arthritis. No swelling. No gout.  NEUROLOGIC: No numbness, tingling, or ataxia. No seizure-type activity.  PSYCHIATRIC: No anxiety. No insomnia. No ADD.    Vitals:   Filed Vitals:   06/11/15 0034 06/11/15 0511 06/11/15 0748 06/11/15 1115  BP: 141/65 149/75 138/65 144/70  Pulse: 101 102 98 98  Temp: 98.9 F (37.2 C) 98.1 F (36.7 C) 99.4 F (37.4 C) 98.4 F (36.9 C)  TempSrc: Oral Oral Oral Oral  Resp: 18 18 14 16   Height:      Weight:  75.66 kg (166 lb 12.8 oz)    SpO2: 94% 96% 96% 96%    Wt Readings from Last 3 Encounters:  06/11/15 75.66 kg (166 lb 12.8 oz)   06/07/15 73.029 kg (161 lb)  06/06/15 75.07 kg (165 lb 8 oz)     Intake/Output Summary (Last 24 hours) at 06/11/15 1241 Last data filed at 06/11/15 0400  Gross per 24 hour  Intake   1525 ml  Output      0 ml  Net   1525 ml    Physical Exam:   GENERAL: Pleasant-appearing in no apparent distress.  HEAD, EYES, EARS, NOSE AND THROAT: Atraumatic, normocephalic. Extraocular muscles are intact. Pupils equal and reactive to light. Sclerae anicteric. No conjunctival injection. No oro-pharyngeal erythema.  NECK:  Supple. There is no jugular venous distention. No bruits, no lymphadenopathy, no thyromegaly.  HEART: Regular rate and rhythm,. No murmurs, no rubs, no clicks.  LUNGS: Clear to auscultation bilaterally. No rales or rhonchi. No wheezes.  ABDOMEN: Soft, flat, nontender, nondistended. Has good bowel sounds. No hepatosplenomegaly appreciated.  EXTREMITIES: No evidence of any cyanosis, clubbing, or peripheral edema.  +2 pedal and radial pulses bilaterally.  NEUROLOGIC: The patient is alert, awake, and oriented x3 with no focal motor or sensory deficits appreciated bilaterally.  SKIN: Moist and warm with no rashes appreciated.  Psych: Not anxious, depressed LN: No inguinal LN enlargement    Antibiotics   Anti-infectives    None      Medications   Scheduled Meds: . acidophilus  1 capsule Oral Daily  . cholecalciferol  1,000 Units Oral Daily  . clonazePAM  1 mg Oral QHS  . docusate sodium  100 mg Oral BID  . heparin  5,000 Units Subcutaneous 3 times per day  . mirtazapine  15 mg Oral QHS  . multivitamin with minerals  1 tablet Oral Daily  . risperiDONE  1 mg Oral QHS  . sodium chloride  3 mL Intravenous Q12H  . venlafaxine XR  150 mg Oral Daily  . venlafaxine XR  75 mg Oral Daily   Continuous Infusions: . sodium chloride 125 mL/hr at 06/11/15 0724   PRN Meds:.acetaminophen **OR** acetaminophen, morphine injection, ondansetron **OR** ondansetron (ZOFRAN) IV,  oxyCODONE   Data Review:   Micro Results Recent Results (from the past 240 hour(s))  Urine culture     Status: None   Collection Time: 06/10/15 10:10 AM  Result Value Ref Range Status   Specimen Description URINE, RANDOM  Final   Special Requests NONE  Final   Culture NO GROWTH 1 DAY  Final   Report Status 06/11/2015 FINAL  Final    Radiology Reports Dg Chest Portable 1 View  06/10/2015  CLINICAL DATA:  Generalized body pain in weakness. Patient had diagnosis of are viral infection last Wednesday EXAM: PORTABLE CHEST 1 VIEW COMPARISON:  None. FINDINGS: The heart size and mediastinal contours are within normal limits. Both lungs are clear. The visualized skeletal structures are unremarkable. IMPRESSION: No active cardiopulmonary disease. Electronically Signed   By: Abelardo Diesel M.D.   On: 06/10/2015 08:00     CBC  Recent Labs Lab 06/07/15 1533 06/10/15 0745 06/11/15 0904  WBC 17.0 Repeated and verified X2.* 14.5* 10.3  HGB 12.6 11.5* 10.2*  HCT 39.2 35.0 30.9*  PLT 430.0* 453* 439  MCV 84.3 82.6 82.7  MCH  --  27.0 27.4  MCHC 32.2 32.8 33.1  RDW 14.4 14.1 14.4  LYMPHSABS 1.4 0.7*  --   MONOABS 1.9* 2.0*  --   EOSABS 0.0 0.0  --   BASOSABS 0.1 0.0  --     Chemistries   Recent Labs Lab 06/07/15 1533 06/10/15 0745  NA 135 133*  K 3.7 3.8  CL 101 100*  CO2 27 25  GLUCOSE 134* 139*  BUN 13 12  CREATININE 0.96 0.80  CALCIUM 10.3 10.0  AST 44* 83*  ALT 70* 103*  ALKPHOS 137* 203*  BILITOT 1.2 0.9   ------------------------------------------------------------------------------------------------------------------ estimated creatinine clearance is 58.7 mL/min (by C-G formula based on Cr of 0.8). ------------------------------------------------------------------------------------------------------------------  Recent Labs  06/10/15 1535  HGBA1C 6.1*    ------------------------------------------------------------------------------------------------------------------ No results for input(s): CHOL, HDL, LDLCALC, TRIG, CHOLHDL, LDLDIRECT in the last 72 hours. ------------------------------------------------------------------------------------------------------------------  Recent Labs  06/10/15 1535  TSH 3.395   ------------------------------------------------------------------------------------------------------------------ No results for input(s): VITAMINB12, FOLATE, FERRITIN, TIBC, IRON, RETICCTPCT in the last 72 hours.  Coagulation profile No results for input(s): INR, PROTIME in the last 168 hours.  No results for input(s): DDIMER in the last 72 hours.  Cardiac Enzymes  Recent Labs Lab 06/10/15 0745  TROPONINI <0.03   ------------------------------------------------------------------------------------------------------------------ Invalid input(s): POCBNP    Assessment & Plan   This is a 79 year old Caucasian female admitted for viral syndrome. 1. Generalized aches and pains likely due to viral syndrome patient also reports difficulty with walking I will check a sedimentation rate and a. Continue provide supportive care. Her urine cultures blood cultures and chest x-ray are negative. Flu test was negative.    2. Tachycardia: Hydrate patient with intravenous fluid. Now resolved EC telemetry 3. Tyroid disease: Continue the same regimen as taking off at home TSH is normal 4 Depression: The patient is on multiple anti-depressants and mood stabilizers. Continue per home regimen. The patient denies suicidal ideation. 5. GI prophylaxis: None as the patient is not critically ill 6. DVT prophylaxis: Heparin      Code Status Orders        Start     Ordered   06/10/15 1452  Full code   Continuous     06/10/15 1451    Advance Directive Documentation        Most Recent Value   Type of Advance Directive  Healthcare Power  of Attorney, Living will   Pre-existing out of facility DNR order (yellow form or pink MOST form)     "MOST" Form in Place?             Consults  none DVT Prophylaxis   Heparin  Lab Results  Component Value Date   PLT 439 06/11/2015     Time Spent in minutes   35 minutes Dustin Flock M.D on 06/11/2015 at 12:41 PM  Between 7am to 6pm - Pager - [phone]  After 6pm go to [email or web address] - password [email or web address] [facility]   Office  [phone]

## 2015-06-12 ENCOUNTER — Inpatient Hospital Stay: Payer: Medicare Other

## 2015-06-12 LAB — MONONUCLEOSIS SCREEN: Mono Screen: NEGATIVE

## 2015-06-12 MED ORDER — KETOROLAC TROMETHAMINE 15 MG/ML IJ SOLN
15.0000 mg | Freq: Three times a day (TID) | INTRAMUSCULAR | Status: DC | PRN
  Administered 2015-06-13: 15 mg via INTRAVENOUS
  Filled 2015-06-12: qty 1

## 2015-06-12 MED ORDER — DEXTROSE 5 % IV SOLN
2.0000 g | Freq: Two times a day (BID) | INTRAVENOUS | Status: DC
  Administered 2015-06-12 – 2015-06-15 (×7): 2 g via INTRAVENOUS
  Filled 2015-06-12 (×9): qty 2

## 2015-06-12 MED ORDER — SODIUM CHLORIDE 0.9 % IV SOLN
INTRAVENOUS | Status: DC
Start: 2015-06-12 — End: 2015-06-14
  Administered 2015-06-12 – 2015-06-14 (×3): via INTRAVENOUS

## 2015-06-12 MED ORDER — GADOBENATE DIMEGLUMINE 529 MG/ML IV SOLN
15.0000 mL | Freq: Once | INTRAVENOUS | Status: AC | PRN
  Administered 2015-06-12: 15 mL via INTRAVENOUS

## 2015-06-12 MED ORDER — PENTAFLUOROPROP-TETRAFLUOROETH EX AERO
INHALATION_SPRAY | Freq: Once | CUTANEOUS | Status: DC
  Filled 2015-06-12: qty 103.5

## 2015-06-12 MED ORDER — IBUPROFEN 400 MG PO TABS
600.0000 mg | ORAL_TABLET | Freq: Four times a day (QID) | ORAL | Status: DC | PRN
  Administered 2015-06-13 – 2015-06-16 (×4): 600 mg via ORAL
  Filled 2015-06-12 (×4): qty 2

## 2015-06-12 NOTE — Care Management Note (Signed)
Case Management Note  Patient Details  Name: Wendy Murray MRN: [id number] Date of Birth: 11/01/1935  Subjective/Objective:                  Met with patient and her husband to discuss discharge planning. They live at the [facility] which has outpatient PT available on-site if needed (outpatient PT RX if necessary). Her PCP is Dr. Levak and he has suggested to patient's husband to request Mono test (patient denies sore throat) and maybe started steriods- husband will ask MD today. She typically is independent at home. She has a rollator and walker available for use in the home.  Action/Plan: List of home health agencies provided in case patient needs PT/RN. RNCM will continue to follow.   Expected Discharge Date:                  Expected Discharge Plan:     In-House Referral:     Discharge planning Services  CM Consult  Post Acute Care Choice:    Choice offered to:  Patient  DME Arranged:    DME Agency:     HH Arranged:    HH Agency:     Status of Service:  Completed, signed off  Medicare Important Message Given:  Yes Date Medicare IM Given:    Medicare IM give by:    Date Additional Medicare IM Given:    Additional Medicare Important Message give by:     If discussed at Long Length of Stay Meetings, dates discussed:    Additional Comments:   , RN 06/12/2015, 10:23 AM  

## 2015-06-12 NOTE — Telephone Encounter (Signed)
PLEASE NOTE: All timestamps contained within this report are represented as [place] Standard Time. CONFIDENTIALTY NOTICE: This fax transmission is intended only for the addressee. It contains information that is legally privileged, confidential or otherwise protected from use or disclosure. If you are not the intended recipient, you are strictly prohibited from reviewing, disclosing, copying using or disseminating any of this information or taking any action in reliance on or regarding this information. If you have received this fax in error, please notify us immediately by telephone so that we can arrange for its return to [place]. Phone: [phone], Toll-Free: [phone], Fax: [phone] Page: 1 of 2 Call Id: [id number] [place] Patient Name: KAITYLN SHURE Gender: Female DOB: 1935/12/10 Age: 79 Y 10 M 24 D Return Phone Number: [phone] (Primary) Address: City/State/Zip: [place] Client [place] [facility] - Client Client Site [place] - Day Physician [name] Contact Type Call Call Type Triage / [place] Name Laurey Arrow or Waunita Schooner ( spouse) Relationship To Patient Self Appointment Disposition EMR Appointment Not Necessary Info pasted into Epic Yes Return Phone Number [phone] (Primary) Chief Complaint Pain - Generalized Initial Comment Caller states: wife went to md white blood count was really high, dx with a viral infection, she is in a lot of pain. PreDisposition Call Doctor Nurse Assessment Nurse: Lavera Guise, RN, Vaughan Basta Date/Time Eilene Ghazi Time): 06/08/2015 4:47:57 PM Confirm and document reason for call. If symptomatic, describe symptoms. ---Caller states: wife went to [facility] white blood count was really high, dx with a viral infection, she is in a lot of pain. Pain is severe, in shoulders and throughout body.. in the bone. Has the  patient traveled out of the country within the last 30 days? ---No Does the patient have any new or worsening symptoms? ---Yes Will a triage be completed? ---Yes Related visit to physician within the last 2 weeks? ---Yes Does the PT have any chronic conditions? (i.e. diabetes, asthma, etc.) ---No Is this a behavioral health or substance abuse call? ---No Guidelines Guideline Title Affirmed Question Affirmed Notes Nurse Date/Time (Eastern Time) Muscle Aches and Body Pain Muscle aches and fever from a minor viral illness (e.g., common cold) (all triage questions negative) [place], RN, Vaughan Basta 06/08/2015 4:58:03 PM Disp. Time Eilene Ghazi Time) Disposition Final User 06/08/2015 5:30:54 PM Send To RN Personal Lavera Guise, RN, Linda 06/08/2015 6:10:39 PM Call Completed Venetia Maxon, RN, Manuela Schwartz 06/08/2015 5:18:44 PM [place], RN, Yetta Numbers NOTE: All timestamps contained within this report are represented as [place] Standard Time. CONFIDENTIALTY NOTICE: This fax transmission is intended only for the addressee. It contains information that is legally privileged, confidential or otherwise protected from use or disclosure. If you are not the intended recipient, you are strictly prohibited from reviewing, disclosing, copying using or disseminating any of this information or taking any action in reliance on or regarding this information. If you have received this fax in error, please notify us immediately by telephone so that we can arrange for its return to [place]. Phone: [phone], Toll-Free: [phone], Fax: [phone] Page: 2 of 2 Call Id: [id number] Caller Understands: Yes Disagree/Comply: Comply Care Advice Given Per Guideline HOME CARE: You should be able to treat this at home. * 100-102 F (37.8 - 38.9 C): Low-grade fevers and may help body fight infection. ACETAMINOPHEN (E.G., TYLENOL): * Acetaminophen is thought to be safer than ibuprofen or naproxen for people over 90 years old.  Acetaminophen is  in many OTC and prescription medicines. It might be in more than one medicine that you are taking. You need to be careful and not take an overdose. An acetaminophen overdose can hurt the liver. CARE ADVICE given per Muscle Aches and Body Pain (Adult) guideline. * You become worse. * Fever lasts over 3 days (72 hours) * Fever goes above 104 F (40 C) and you are unable to get it down CALL BACK IF: * Take 650 mg (two 325 mg pills) by mouth every 4-6 hours as needed. Each Regular Strength Tylenol pill has 325 mg of acetaminophen. The most you should take each day is 3,250 mg (10 Regular Strength pills a day). EXTRA NOTES: * For muscle aches, headaches, or moderate fever (over 101 degrees F) (38.9 C) use acetaminophen every 4 hours. TREATMENT FOR ASSOCIATED SYMPTOMS OF COLDS: After Care Instructions Given Call Event Type User Date / Time Description

## 2015-06-12 NOTE — Telephone Encounter (Signed)
She was admitted I will check on her in the hospital

## 2015-06-12 NOTE — Care Management Important Message (Signed)
Important Message  Patient Details  Name: Wendy Murray MRN: [id number] Date of Birth: 05-13-1936   Medicare Important Message Given:  Yes    Marshell Garfinkel, RN 06/12/2015, 7:51 AM

## 2015-06-12 NOTE — Progress Notes (Addendum)
[place] at [facility]                                                                                                                                                                                            Patient Demographics   Wendy Murray, is a 79 y.o. female, DOB - Oct 30, 1935, [id number]  Admit date - 06/10/2015   Admitting Physician Harrie Foreman, MD  Outpatient Primary MD for the patient is [name], MD   LOS - 2  Subjective  Patient continues to complain of generalized aches and pains. Denies any cough, abdominal pain nausea vomiting or diarrhea denies any urinary symptoms. Right knee pain.   Review of Systems:   CONSTITUTIONAL: No documented fever. Positive fatigue and weakness. No weight gain, no weight loss.  EYES: No blurry or double vision.  ENT: No tinnitus. No postnasal drip. No redness of the oropharynx.  RESPIRATORY: No cough, no wheeze, no hemoptysis. No dyspnea.  CARDIOVASCULAR: No chest pain. No orthopnea. No palpitations. No syncope.  GASTROINTESTINAL: No nausea, no vomiting or diarrhea. No abdominal pain. No melena or hematochezia.  GENITOURINARY: No dysuria or hematuria.  ENDOCRINE: No polyuria or nocturia. No heat or cold intolerance.  HEMATOLOGY: No anemia. No bruising. No bleeding.  INTEGUMENTARY: No rashes. No lesions.  MUSCULOSKELETAL: No arthritis. No swelling. No gout.  NEUROLOGIC: No numbness, tingling, or ataxia. No seizure-type activity.  PSYCHIATRIC: No anxiety. No insomnia. No ADD.    Vitals:   Filed Vitals:   06/12/15 0454 06/12/15 0500 06/12/15 0808 06/12/15 1137  BP: 146/72  138/70 145/70  Pulse: 102  108 111  Temp: 98.6 F (37 C)  98.2 F (36.8 C) 98.6 F (37 C)  TempSrc: Oral  Oral Oral  Resp: '18  18 18  ' Height:      Weight:  76.658 kg (169 lb)    SpO2: 94%  96% 92%    Wt Readings from Last 3 Encounters:  06/12/15 76.658 kg (169 lb)  06/07/15 73.029 kg (161 lb)   06/06/15 75.07 kg (165 lb 8 oz)     Intake/Output Summary (Last 24 hours) at 06/12/15 1517 Last data filed at 06/12/15 4010  Gross per 24 hour  Intake   2416 ml  Output    250 ml  Net   2166 ml    Physical Exam:   GENERAL: Pleasant-appearing in no apparent distress.  HEAD, EYES, EARS, NOSE AND THROAT: Atraumatic, normocephalic. Extraocular muscles are intact. Pupils equal and reactive to light. Sclerae anicteric. No conjunctival injection. No oro-pharyngeal erythema.  NECK: Supple. There is no jugular venous distention.  No bruits, no lymphadenopathy, no thyromegaly.  HEART: Regular rate and rhythm,. No murmurs, no rubs, no clicks.  LUNGS: Clear to auscultation bilaterally. No rales or rhonchi. No wheezes.  ABDOMEN: Soft, flat, nontender, nondistended. Has good bowel sounds. No hepatosplenomegaly appreciated.  EXTREMITIES: No evidence of any cyanosis, clubbing, or peripheral edema.  +2 pedal and radial pulses bilaterally.  NEUROLOGIC: The patient is alert, awake, and oriented x3 with no focal motor or sensory deficits appreciated bilaterally.  SKIN: Moist and warm with no rashes appreciated.  Psych: Not anxious, depressed LN: No inguinal LN enlargement Right knee warm    Antibiotics   Anti-infectives    Start     Dose/Rate Route Frequency Ordered Stop   06/12/15 1430  cefTRIAXone (ROCEPHIN) 2 g in dextrose 5 % 50 mL IVPB     2 g 100 mL/hr over 30 Minutes Intravenous Every 12 hours 06/12/15 1415     06/11/15 1900  cefTRIAXone (ROCEPHIN) 1 g in dextrose 5 % 50 mL IVPB     1 g 100 mL/hr over 30 Minutes Intravenous  Once 06/11/15 1855 06/11/15 2123   06/11/15 1900  vancomycin (VANCOCIN) IVPB 1000 mg/200 mL premix     1,000 mg 200 mL/hr over 60 Minutes Intravenous  Once 06/11/15 1855 06/11/15 2042      Medications   Scheduled Meds: . acidophilus  1 capsule Oral Daily  . cefTRIAXone (ROCEPHIN)  IV  2 g Intravenous Q12H  . cholecalciferol  1,000 Units Oral Daily  .  clonazePAM  1 mg Oral QHS  . docusate sodium  100 mg Oral BID  . heparin  5,000 Units Subcutaneous 3 times per day  . levothyroxine  75 mcg Oral QAC breakfast  . liothyronine  10 mcg Oral Daily  . mirtazapine  15 mg Oral QHS  . multivitamin with minerals  1 tablet Oral Daily  . risperiDONE  1 mg Oral QHS  . sodium chloride  3 mL Intravenous Q12H  . venlafaxine XR  150 mg Oral Daily  . venlafaxine XR  75 mg Oral Daily   Continuous Infusions: . sodium chloride 50 mL/hr at 06/12/15 1415   PRN Meds:.acetaminophen **OR** acetaminophen, ibuprofen, ketorolac, ondansetron **OR** ondansetron (ZOFRAN) IV, oxyCODONE   Data Review:   Micro Results Recent Results (from the past 240 hour(s))  Blood culture (routine x 2)     Status: None (Preliminary result)   Collection Time: 06/10/15  7:45 AM  Result Value Ref Range Status   Specimen Description BLOOD LEFT AC  Final   Special Requests BOTTLES DRAWN AEROBIC AND ANAEROBIC 2ML  Final   Culture NO GROWTH 1 DAY  Final   Report Status PENDING  Incomplete  Blood culture (routine x 2)     Status: None (Preliminary result)   Collection Time: 06/10/15  7:45 AM  Result Value Ref Range Status   Specimen Description BLOOD LEFT WRIST  Final   Special Requests   Final    BOTTLES DRAWN AEROBIC AND ANAEROBIC ANAEROBIC4ML AEROBIC 3ML   Culture NO GROWTH 1 DAY  Final   Report Status PENDING  Incomplete  Urine culture     Status: None   Collection Time: 06/10/15 10:10 AM  Result Value Ref Range Status   Specimen Description URINE, RANDOM  Final   Special Requests NONE  Final   Culture NO GROWTH 1 DAY  Final   Report Status 06/11/2015 FINAL  Final    Radiology Reports Dg Chest Portable 1 View  06/10/2015  CLINICAL DATA:  Generalized body pain in weakness. Patient had diagnosis of are viral infection last Wednesday EXAM: PORTABLE CHEST 1 VIEW COMPARISON:  None. FINDINGS: The heart size and mediastinal contours are within normal limits. Both lungs are  clear. The visualized skeletal structures are unremarkable. IMPRESSION: No active cardiopulmonary disease. Electronically Signed   By: Abelardo Diesel M.D.   On: 06/10/2015 08:00     CBC  Recent Labs Lab 06/07/15 1533 06/10/15 0745 06/11/15 0904  WBC 17.0 Repeated and verified X2.* 14.5* 10.3  HGB 12.6 11.5* 10.2*  HCT 39.2 35.0 30.9*  PLT 430.0* 453* 439  MCV 84.3 82.6 82.7  MCH  --  27.0 27.4  MCHC 32.2 32.8 33.1  RDW 14.4 14.1 14.4  LYMPHSABS 1.4 0.7*  --   MONOABS 1.9* 2.0*  --   EOSABS 0.0 0.0  --   BASOSABS 0.1 0.0  --     Chemistries   Recent Labs Lab 06/07/15 1533 06/10/15 0745  NA 135 133*  K 3.7 3.8  CL 101 100*  CO2 27 25  GLUCOSE 134* 139*  BUN 13 12  CREATININE 0.96 0.80  CALCIUM 10.3 10.0  AST 44* 83*  ALT 70* 103*  ALKPHOS 137* 203*  BILITOT 1.2 0.9   ------------------------------------------------------------------------------------------------------------------ estimated creatinine clearance is 59 mL/min (by C-G formula based on Cr of 0.8). ------------------------------------------------------------------------------------------------------------------  Recent Labs  06/10/15 1535  HGBA1C 6.1*   ------------------------------------------------------------------------------------------------------------------ No results for input(s): CHOL, HDL, LDLCALC, TRIG, CHOLHDL, LDLDIRECT in the last 72 hours. ------------------------------------------------------------------------------------------------------------------  Recent Labs  06/10/15 1535  TSH 3.395   ------------------------------------------------------------------------------------------------------------------ No results for input(s): VITAMINB12, FOLATE, FERRITIN, TIBC, IRON, RETICCTPCT in the last 72 hours.  Coagulation profile No results for input(s): INR, PROTIME in the last 168 hours.  No results for input(s): DDIMER in the last 72 hours.  Cardiac Enzymes  Recent Labs Lab  06/10/15 0745  TROPONINI <0.03   ------------------------------------------------------------------------------------------------------------------ Invalid input(s): POCBNP    Assessment & Plan   This is a 79 year old Caucasian female admitted for fever  # Sepsis Unknown etiology Consulted ID Ortho consult for Right knee tap. Elevated ESR. Autoimmune? PMR? On IV abx Check ANA/reflex and RF  # Thyroid disease: Continue the same regimen as taking off at home TSH is normal  # Depression: The patient is on multiple anti-depressants and mood stabilizers. Continue per home regimen.  # DVT prophylaxis: Heparin      Code Status Orders        Start     Ordered   06/10/15 1452  Full code   Continuous     06/10/15 1451    Advance Directive Documentation        Most Recent Value   Type of Advance Directive  Healthcare Power of Attorney, Living will   Pre-existing out of facility DNR order (yellow form or pink MOST form)     "MOST" Form in Place?         Consults  none DVT Prophylaxis   Heparin  Lab Results  Component Value Date   PLT 439 06/11/2015    Time Spent in minutes   35 minutes  Hillary Bow R M.D on 06/12/2015 at 3:17 PM  Between 7am to 6pm - Pager - [phone]  After 6pm go to [email or web address] - password [email or web address] [facility]   Office  [phone]

## 2015-06-12 NOTE — Consult Note (Signed)
[facility] Infectious Disease     Reason for Consult: Fever    Referring Physician: Tanda Rockers of Admission:  06/10/2015   Active Problems:   Viral sepsis   HPI: Wendy Murray is a 79 y.o. female with hx HTN, hypothyroidism, IBS, depression admitted with malaise, myalgias and fatigue and leukocytosis for a week. She reports feeling badly starting with whole body aching, pain in legs and knees. Then developed fevers and felt very weak.  She also reports mild -mod HA, mod neck pain, mod T spine pain. Denies cough, sinus congestion, ST, oral lesions, dental infections. No abd pain, nvd. No dysuria. No recent skin or soft tissue infections Was at dentist a month ago for routine cleaning- takes an abx prior.  Lives at home with husband, no sick contacts, no travel. Has a pet dog, no other animal contact. No travel.  No new meds  Flu neg, UA neg, LFTs mildly elevated. ESR 105, CPK nml, CXR negative WBC with neutrophilia but decreasing. Has thrombocytosis.    [place] 12/25 ngtd, ucx neg.   Past Medical History  Diagnosis Date  . Hyperlipidemia   . Recurrent major depression ([place])   . Hypothyroidism   . DVT (deep venous thrombosis) ([place]) 2012    after TKR  . Osteoarthritis, knee   . IBS (irritable bowel syndrome)   . Diverticulosis    Past Surgical History  Procedure Laterality Date  . Meniscus repair Left 11/11  . Total knee arthroplasty Left 2012  . Lumbar fusion  1993  . Cholecystectomy  1998  . Abdominal hysterectomy  1991  . Cataract extraction w/ intraocular lens  implant, bilateral  2014  . Dexa  3/09    Normal  . Hammer toe surgery  5/16    Dr Barkley Bruns   Social History  Substance Use Topics  . Smoking status: Former Smoker    Quit date: 06/16/1981  . Smokeless tobacco: Never Used     Comment: quit 1983  . Alcohol Use: 0.0 oz/week    0 Standard drinks or equivalent per week     Comment: rarely   Family History  Problem Relation Age of Onset  . Cancer Mother      colon  . Depression Mother   . Cancer Father   . Depression Sister   . Hypertension Neg Hx   . Heart disease Neg Hx   . Diabetes Neg Hx     Allergies: No Known Allergies  Current antibiotics: Antibiotics Given (last 72 hours)    Date/Time Action Medication Dose Rate   06/11/15 1942 Given   vancomycin (VANCOCIN) IVPB 1000 mg/200 mL premix 1,000 mg 200 mL/hr   06/11/15 2053 Given   cefTRIAXone (ROCEPHIN) 1 g in dextrose 5 % 50 mL IVPB 1 g 100 mL/hr      MEDICATIONS: . acidophilus  1 capsule Oral Daily  . cholecalciferol  1,000 Units Oral Daily  . clonazePAM  1 mg Oral QHS  . docusate sodium  100 mg Oral BID  . heparin  5,000 Units Subcutaneous 3 times per day  . levothyroxine  75 mcg Oral QAC breakfast  . liothyronine  10 mcg Oral Daily  . mirtazapine  15 mg Oral QHS  . multivitamin with minerals  1 tablet Oral Daily  . risperiDONE  1 mg Oral QHS  . sodium chloride  3 mL Intravenous Q12H  . venlafaxine XR  150 mg Oral Daily  . venlafaxine XR  75 mg Oral Daily  Review of Systems - 11 systems reviewed and negative per HPI  OBJECTIVE: Temp:  [98.1 F (36.7 C)-102.2 F (39 C)] 98.6 F (37 C) (12/27 1137) Pulse Rate:  [90-111] 111 (12/27 1137) Resp:  [16-18] 18 (12/27 1137) BP: (122-146)/(57-72) 145/70 mmHg (12/27 1137) SpO2:  [92 %-96 %] 92 % (12/27 1137) Weight:  [76.658 kg (169 lb)] 76.658 kg (169 lb) (12/27 0500) Physical Exam  Constitutional:  oriented to person, place, and time. appears acutely ill, is warm to touch, in pain  HENT: [place]/AT, PERRLA, no scleral icterus Mouth/Throat: Oropharynx is clear and moist. No oropharyngeal exudate.  No dental lesions Cardiovascular: Tachy, reg, no murmur Pulmonary/Chest: Effort normal and breath sounds normal. No respiratory distress.  has no wheezes.  Neck - has pain with neck flexion  Abdominal: Soft. Bowel sounds are normal.  exhibits no distension. There is no tenderness.  Lymphadenopathy: no cervical adenopathy.  No axillary adenopathy Neurological: alert and oriented to person, place, and time.  Skin: Skin is hot and dry. No rash noted. No erythema. NO peripheral stigmata of endocarditis Psychiatric: a normal mood and affect.  behavior is normal.  EXT R knee is swollen, she will not let me flex past 30degrees de to pain L knee TKR scar wnl  LABS: Results for orders placed or performed during the hospital encounter of 06/10/15 (from the past 48 hour(s))  Lactic acid, plasma     Status: None   Collection Time: 06/10/15  3:35 PM  Result Value Ref Range   Lactic Acid, Venous 1.3 0.5 - 2.0 mmol/L  TSH     Status: None   Collection Time: 06/10/15  3:35 PM  Result Value Ref Range   TSH 3.395 0.350 - 4.500 uIU/mL  Hemoglobin A1c     Status: Abnormal   Collection Time: 06/10/15  3:35 PM  Result Value Ref Range   Hgb A1c MFr Bld 6.1 (H) 4.0 - 6.0 %  CBC     Status: Abnormal   Collection Time: 06/11/15  9:04 AM  Result Value Ref Range   WBC 10.3 3.6 - 11.0 K/uL   RBC 3.73 (L) 3.80 - 5.20 MIL/uL   Hemoglobin 10.2 (L) 12.0 - 16.0 g/dL   HCT 30.9 (L) 35.0 - 47.0 %   MCV 82.7 80.0 - 100.0 fL   MCH 27.4 26.0 - 34.0 pg   MCHC 33.1 32.0 - 36.0 g/dL   RDW 14.4 11.5 - 14.5 %   Platelets 439 150 - 440 K/uL  Sedimentation rate     Status: Abnormal   Collection Time: 06/11/15  9:04 AM  Result Value Ref Range   Sed Rate 105 (H) 0 - 30 mm/hr   No components found for: ESR, C REACTIVE PROTEIN MICRO: Recent Results (from the past 720 hour(s))  Blood culture (routine x 2)     Status: None (Preliminary result)   Collection Time: 06/10/15  7:45 AM  Result Value Ref Range Status   Specimen Description BLOOD LEFT AC  Final   Special Requests BOTTLES DRAWN AEROBIC AND ANAEROBIC 2ML  Final   Culture NO GROWTH 1 DAY  Final   Report Status PENDING  Incomplete  Blood culture (routine x 2)     Status: None (Preliminary result)   Collection Time: 06/10/15  7:45 AM  Result Value Ref Range Status   Specimen  Description BLOOD LEFT WRIST  Final   Special Requests   Final    BOTTLES DRAWN AEROBIC AND ANAEROBIC ANAEROBIC4ML AEROBIC 3ML  Culture NO GROWTH 1 DAY  Final   Report Status PENDING  Incomplete  Urine culture     Status: None   Collection Time: 06/10/15 10:10 AM  Result Value Ref Range Status   Specimen Description URINE, RANDOM  Final   Special Requests NONE  Final   Culture NO GROWTH 1 DAY  Final   Report Status 06/11/2015 FINAL  Final    IMAGING: Dg Chest Portable 1 View  06/10/2015  CLINICAL DATA:  Generalized body pain in weakness. Patient had diagnosis of are viral infection last Wednesday EXAM: PORTABLE CHEST 1 VIEW COMPARISON:  None. FINDINGS: The heart size and mediastinal contours are within normal limits. Both lungs are clear. The visualized skeletal structures are unremarkable. IMPRESSION: No active cardiopulmonary disease. Electronically Signed   By: Abelardo Diesel M.D.   On: 06/10/2015 08:00   Assessment:   Wendy Murray is a 79 y.o. female with one week myalgias, fevers, neck pain and R knee pain. WU so far negative. Could be viral but flu negative and has neutrophilia.  Had dental work a month ago so concern for endocarditis or septic joint especially with R knee pain. C spine osteomyeltis or meningitis also possible  Recommendations Consult ortho for tap R knee to send for cell count diff, crystals, culture. Defer to ortho on imaging of knee  C spine MRI - I have discussed with patient and ordered Increase ctx to 2 gm q 12.  Cont vanco If WU above negative would do LP  Thank you very much for allowing me to participate in the care of this patient. Please call with questions.   Cheral Marker. Ola Spurr, MD

## 2015-06-12 NOTE — Consult Note (Signed)
ORTHOPAEDIC CONSULTATION  PATIENT NAME: Wendy Murray DOB: Nov 10, 1935  MRN: [id number]  REQUESTING PHYSICIAN: Adrian Prows, M.D.  Chief Complaint: Right knee pain and swelling  HPI: Wendy Murray is a 79 y.o. female who complains of  a 7-10 day history of progressive right knee pain and swelling. She denies any specific trauma to the right knee. She does have a history of some arthritis, but has had significant increase in her knee pain over the course of the last 7-10 days. She denies any erythema or increased warmth to the knee. She has noticed increased pain with attempts at full extension or flexion of the knee. She has been using a walker at home due to the increased pain. The patient has had fevers, chills, and generalized malaise concomitant with the knee symptoms. Of note, the patient recently underwent dental work but did receive preprocedure antibiotics by her dentist due to her previous left total knee arthroplasty.  The patient denies any productive cough, sinus drainage, skin lesions, nausea, vomiting, abdominal discomfort, dysuria or urinary frequency. She denies any significant pain or swelling to the left knee.  Past Medical History  Diagnosis Date  . Hyperlipidemia   . Recurrent major depression ([place])   . Hypothyroidism   . DVT (deep venous thrombosis) ([place]) 2012    after TKR  . Osteoarthritis, knee   . IBS (irritable bowel syndrome)   . Diverticulosis    Past Surgical History  Procedure Laterality Date  . Meniscus repair Left 11/11  . Total knee arthroplasty Left 2012  . Lumbar fusion  1993  . Cholecystectomy  1998  . Abdominal hysterectomy  1991  . Cataract extraction w/ intraocular lens  implant, bilateral  2014  . Dexa  3/09    Normal  . Hammer toe surgery  5/16    Dr Barkley Bruns   Social History   Social History  . Marital Status: Married    Spouse Name: N/A  . Number of Children: 3  . Years of Education: N/A   Occupational History  .  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 Smoking status: Former Smoker    Quit date: 06/16/1981  . Smokeless tobacco: Never Used     Comment: quit 1983  . Alcohol Use: 0.0 oz/week    0 Standard drinks or equivalent per week     Comment: rarely  . Drug Use: No  . Sexual Activity: No   Other Topics Concern  . None   Social History Narrative   [place]      Has living will    Husband, then daughter Joycelyn Schmid, have health care POA   Has DNR ---order rewritten   No feeding tube if cognitively unaware   Family History  Problem Relation Age of Onset  . Cancer Mother     colon  . Depression Mother   . Cancer Father   . Depression Sister   . Hypertension Neg Hx   . Heart disease Neg Hx   . Diabetes Neg Hx    No Known Allergies Prior to Admission medications   Medication Sig Start Date End Date Taking? Authorizing Provider  acetaminophen (TYLENOL) 500 MG tablet Take 500 mg by mouth every 6 (six) hours as needed for mild pain or headache.    Yes Historical Provider, MD  acidophilus (RISAQUAD) CAPS capsule Take 1 capsule by mouth daily.   Yes Historical Provider, MD  cholecalciferol (VITAMIN D) 1000 UNITS tablet Take 1,000 Units by mouth daily.   Yes Historical Provider, MD  clonazePAM (KLONOPIN) 1 MG tablet Take 1 mg by mouth at bedtime.    Yes Historical Provider, MD  levothyroxine (LEVOTHROID) 75 MCG tablet Take 1 tablet (75 mcg total) by mouth daily before breakfast. 12/19/14  Yes Venia Carbon, MD  liothyronine (CYTOMEL) 5 MCG tablet Take 10 mcg by mouth daily.   Yes Historical Provider, MD  mirtazapine (REMERON) 15 MG tablet Take 15 mg by mouth at bedtime.    Yes Historical Provider, MD  Multiple Vitamin (MULTIVITAMIN WITH MINERALS) TABS tablet Take 1 tablet by mouth daily.   Yes Historical Provider, MD  risperiDONE (RISPERDAL) 1 MG tablet Take 1 mg by mouth at bedtime.    Yes Historical Provider, MD   venlafaxine XR (EFFEXOR-XR) 150 MG 24 hr capsule Take 150 mg by mouth daily. Pt takes with a 75mg  capsule.   Yes Historical Provider, MD  venlafaxine XR (EFFEXOR-XR) 75 MG 24 hr capsule Take 75 mg by mouth daily. Pt takes with a 150mg  capsule.   Yes Historical Provider, MD  baclofen (LIORESAL) 10 MG tablet Take 1 tablet (10 mg total) by mouth 3 (three) times daily as needed for muscle spasms. Patient not taking: Reported on 06/10/2015 06/06/15   Leone Haven, MD   Mr Cervical Spine W Wo Contrast  06/12/2015  CLINICAL DATA:  Neck pain weakness.  Headache.  Leukocytosis. EXAM: MRI CERVICAL SPINE WITHOUT AND WITH CONTRAST TECHNIQUE: Multiplanar and multiecho pulse sequences of the cervical spine, to include the craniocervical junction and cervicothoracic junction, were obtained according to standard protocol without and with intravenous contrast. CONTRAST:  34mL MULTIHANCE GADOBENATE DIMEGLUMINE 529 MG/ML IV SOLN COMPARISON:  None. FINDINGS: Normal alignment of the cervical vertebral bodies. They demonstrate normal marrow signal except for patchy endplate reactive changes and a few small scattered hemangiomas. Patchy marrow signal in the clivus is probably due to the patient's age and osteoporosis. The cervical spinal cord demonstrates normal signal intensity. No cord lesions or syrinx. There is a small enhancing extra-axial lesion at the skullbase posteriorly which is most consistent with a meningioma. It measures approximately 9 mm. No mass effect on the upper cervical cord. C2-3:  No significant findings. C3-4: Mild annular bulge with slight impression on the ventral thecal sac. No significant spinal or foraminal stenosis. Mild left foraminal encroachment. C4-5: Mild bulging annulus and mild facet disease but no significant spinal or foraminal stenosis. C5-6: Bulging annulus, osteophytic ridging and uncinate spurring with mild flattening of the ventral thecal sac and narrowing of the ventral CSF space.  Small focal medial foraminal disc protrusion could affect the left C6 nerve root. C6-7: Shallow broad-based disc protrusion with flattening of the ventral thecal sac and narrowing of the ventral CSF space. No significant foraminal stenosis. C7-T1:  No significant findings. IMPRESSION: 1. Mild degenerative cervical spondylosis with multilevel disc disease and facet disease. No findings for diskitis, osteomyelitis or abscess. 2. 9 mm enhancing extra-axial lesion at the foramen magnum posteriorly most consistent with a benign meningioma. No mass effect on the upper cervical cord. 3. Mild spinal and left foraminal stenosis at C5-6. 4. Shallow broad-based disc protrusion at C6-7. Electronically Signed   By: Marijo Sanes M.D.   On: 06/12/2015 17:02   Dg Knee Right Port  06/12/2015  CLINICAL DATA:  Right knee pain. EXAM: PORTABLE RIGHT KNEE - 1-2 VIEW COMPARISON:  None. FINDINGS: No fracture or dislocation is noted.  Moderate suprapatellar joint effusion is noted. Chondrocalcinosis is noted in the medial lateral joint spaces. No significant joint space narrowing is noted. IMPRESSION: Extensive chondrocalcinosis is noted median laterally suggesting degenerative joint disease or calcium pyrophosphate disease. Moderate suprapatellar joint effusion is noted. No fracture or dislocation is noted. Electronically Signed   By: Marijo Conception, M.D.   On: 06/12/2015 19:36    Positive ROS: All other systems have been reviewed and were otherwise negative with the exception of those mentioned in the HPI and as above.  Physical Exam: General: Alert and alert in no acute distress. HEENT: Atraumatic and normocephalic. Sclera are clear. Extraocular motion is intact. Oropharynx is clear with moist mucosa. Neck: Supple, nontender, good range of motion. No JVD or carotid bruits. Lungs: Clear to auscultation bilaterally. Cardiovascular: Regular rate and rhythm with normal S1 and S2. No murmurs. No gallops or rubs. Pedal pulses  are palpable bilaterally. Homans test is negative bilaterally. No significant pretibial or ankle edema. Abdomen: Soft, nontender, and nondistended. Bowel sounds are present. Skin: No lesions in the area of chief complaint Neurologic: Awake, alert, and oriented. Sensory function is grossly intact. Motor strength is felt to be 5 over 5 bilaterally. No clonus or tremor. Good motor coordination. Lymphatic: No axillary or cervical lymphadenopathy  MUSCULOSKELETAL: Good range of motion, stability, and strength to the upper extremities. No joint swelling or erythema to the upper extremities.  Examination of lower extremities is notable for the right knee. Moderate swelling is appreciated with a moderate sized effusion. No erythema or significant increased warmth. No skin breakdown. The patella is ballotable. No medial or lateral joint line tenderness. Knee is stable to varus and valgus stress. Range of motion is limited secondary to guarding with the patient lacking approximately 10 of extension and flexion to approximately 40. Of note, there is a well-healed anterior surgical incision over the left knee with no appreciable soft tissue swelling, erythema, or effusion.  Assessment: Right knee effusion  Plan: The patient presented with fever and leukocytosis. Sedimentation rate has risen. The white count has improved with antibiotic coverage including Rocephin and vancomycin. I have recommended aspiration of the right knee and submitting the fluid for culture, cell count, and evaluation crystals. The radiographic findings of chondrocalcinosis are suggestive of underlying pseudogout. The appropriate consents were obtained from the patient after discussion of the risks and benefits of right knee aspiration.  Procedure: The right knee was cleaned and prepped with Betadine. The skin was anesthetized with ethylene chloride and an 18-gauge needle was inserted via a superolateral approach. 25 mL of cloudy yellow  fluid was aspirated without difficulty. The fluid was submitted to the lab for Gram stain, cultures, synovial cell count with differential and evaluation for crystals.  I would agree with continued antibiotic coverage. I will defer any plans for surgical intervention pending the culture results.  James P. Holley Bouche M.D.

## 2015-06-13 LAB — ENA+DNA/DS+ANTICH+CENTRO+JO...
Anti JO-1: <0.2 AI (ref 0.0–0.9)
Anti JO-1: <0.2 AI (ref 0.0–0.9)
Centromere Ab Screen: <0.2 AI (ref 0.0–0.9)
Chromatin Ab SerPl-aCnc: <0.2 AI (ref 0.0–0.9)
Chromatin Ab SerPl-aCnc: <0.2 AI (ref 0.0–0.9)
ENA SM Ab Ser-aCnc: <0.2 AI (ref 0.0–0.9)
ENA SM Ab Ser-aCnc: <0.2 AI (ref 0.0–0.9)
RIBONUCLEIC PROTEIN: 1.3 AI — AB (ref 0.0–0.9)
Ribonucleic Protein: 1.3 AI — ABNORMAL HIGH (ref 0.0–0.9)
SSA (Ro) (ENA) Antibody, IgG: <0.2 AI (ref 0.0–0.9)
SSB (La) (ENA) Antibody, IgG: <0.2 AI (ref 0.0–0.9)
Scleroderma (Scl-70) (ENA) Antibody, IgG: 0.2 AI (ref 0.0–0.9)
Scleroderma (Scl-70) (ENA) Antibody, IgG: 0.2 AI (ref 0.0–0.9)
ds DNA Ab: <1 IU/mL (ref 0–9)
ds DNA Ab: <1 IU/mL (ref 0–9)

## 2015-06-13 LAB — SYNOVIAL CELL COUNT + DIFF, W/ CRYSTALS
Eosinophils-Synovial: 0 %
Lymphocytes-Synovial Fld: 2 %
Monocyte-Macrophage-Synovial Fluid: 11 %
NEUTROPHIL, SYNOVIAL: 87 %
Other Cells-SYN: 0
WBC, SYNOVIAL: 18646 /mm3 — AB (ref 0–200)

## 2015-06-13 LAB — RHEUMATOID FACTOR: Rhuematoid fact SerPl-aCnc: 16 IU/mL — ABNORMAL HIGH (ref 0.0–13.9)

## 2015-06-13 LAB — CBC
HEMATOCRIT: 28.2 % — AB (ref 35.0–47.0)
HEMOGLOBIN: 9.2 g/dL — AB (ref 12.0–16.0)
MCH: 26.7 pg (ref 26.0–34.0)
MCHC: 32.7 g/dL (ref 32.0–36.0)
MCV: 81.6 fL (ref 80.0–100.0)
Platelets: 456 10*3/uL — ABNORMAL HIGH (ref 150–440)
RBC: 3.46 MIL/uL — ABNORMAL LOW (ref 3.80–5.20)
RDW: 13.9 % (ref 11.5–14.5)
WBC: 11.7 10*3/uL — ABNORMAL HIGH (ref 3.6–11.0)

## 2015-06-13 LAB — ANA COMPREHENSIVE PANEL
Anti JO-1: <0.2 AI (ref 0.0–0.9)
Centromere Ab Screen: <0.2 AI (ref 0.0–0.9)
Ribonucleic Protein: 1.2 AI — ABNORMAL HIGH (ref 0.0–0.9)
SCLERODERMA (SCL-70) (ENA) ANTIBODY, IGG: 0.2 AI (ref 0.0–0.9)
SSA (Ro) (ENA) Antibody, IgG: <0.2 AI (ref 0.0–0.9)
SSB (La) (ENA) Antibody, IgG: <0.2 AI (ref 0.0–0.9)

## 2015-06-13 LAB — ANA W/REFLEX IF POSITIVE
ANA: POSITIVE — AB
Anti Nuclear Antibody(ANA): POSITIVE — AB

## 2015-06-13 MED ORDER — METHYLPREDNISOLONE SODIUM SUCC 125 MG IJ SOLR
80.0000 mg | Freq: Every day | INTRAMUSCULAR | Status: DC
  Administered 2015-06-13 – 2015-06-16 (×4): 80 mg via INTRAVENOUS
  Filled 2015-06-13 (×4): qty 2

## 2015-06-13 NOTE — Progress Notes (Signed)
[place] at [facility]                                                                                                                                                                                            Patient Demographics   Wendy Murray, is a 79 y.o. female, DOB - [birth year], UJW:[id number]  Admit date - 06/10/2015   Admitting Physician Harrie Foreman, MD  Outpatient Primary MD for the patient is [name], MD   LOS - 3  Subjective  Patient continues to complain of generalized aches and pains. Shoulders, neck, headache, back. Denies any cough, abdominal pain nausea vomiting or diarrhea denies any urinary symptoms. Right knee pain still present. Had Right knee tap 12/27. Tmax 100   Review of Systems:   CONSTITUTIONAL: No documented fever. Positive fatigue and weakness. No weight gain, no weight loss.  EYES: No blurry or double vision.  ENT: No tinnitus. No postnasal drip. No redness of the oropharynx.  RESPIRATORY: No cough, no wheeze, no hemoptysis. No dyspnea.  CARDIOVASCULAR: No chest pain. No orthopnea. No palpitations. No syncope.  GASTROINTESTINAL: No nausea, no vomiting or diarrhea. No abdominal pain. No melena or hematochezia.  GENITOURINARY: No dysuria or hematuria.  ENDOCRINE: No polyuria or nocturia. No heat or cold intolerance.  HEMATOLOGY: No anemia. No bruising. No bleeding.  INTEGUMENTARY: No rashes. No lesions.  MUSCULOSKELETAL: No arthritis. No swelling. No gout.  NEUROLOGIC: No numbness, tingling, or ataxia. No seizure-type activity.  PSYCHIATRIC: No anxiety. No insomnia. No ADD.    Vitals:   Filed Vitals:   06/13/15 0017 06/13/15 0338 06/13/15 0835 06/13/15 1224  BP: 135/59 130/64 134/63 116/60  Pulse: 113 112 99 95  Temp: 100.2 F (37.9 C) 100 F (37.8 C) 97.8 F (36.6 C) 98.4 F (36.9 C)  TempSrc: Oral Oral Oral Oral  Resp:  '18 18 18  ' Height:      Weight:  75.206 kg (165 lb 12.8 oz)     SpO2: 92% 91% 92% 95%    Wt Readings from Last 3 Encounters:  06/13/15 75.206 kg (165 lb 12.8 oz)  06/07/15 73.029 kg (161 lb)  06/06/15 75.07 kg (165 lb 8 oz)     Intake/Output Summary (Last 24 hours) at 06/13/15 1315 Last data filed at 06/13/15 0802  Gross per 24 hour  Intake 1545.5 ml  Output      0 ml  Net 1545.5 ml    Physical Exam:   GENERAL: Pleasant-appearing in no apparent distress.  HEAD, EYES, EARS, NOSE AND THROAT: Atraumatic, normocephalic. Extraocular muscles are intact. Pupils equal and reactive to  light. Sclerae anicteric. No conjunctival injection. No oro-pharyngeal erythema.  NECK: Supple. There is no jugular venous distention. No bruits, no lymphadenopathy, no thyromegaly.  HEART: Regular rate and rhythm,. No murmurs, no rubs, no clicks.  LUNGS: Clear to auscultation bilaterally. No rales or rhonchi. No wheezes.  ABDOMEN: Soft, flat, nontender, nondistended. Has good bowel sounds. No hepatosplenomegaly appreciated.  EXTREMITIES: No evidence of any cyanosis, clubbing, or peripheral edema.  +2 pedal and radial pulses bilaterally.  NEUROLOGIC: The patient is alert, awake, and oriented x3 with no focal motor or sensory deficits appreciated bilaterally.  SKIN: Moist and warm with no rashes appreciated.  Psych: Not anxious, depressed LN: No inguinal LN enlargement Right knee warm    Antibiotics   Anti-infectives    Start     Dose/Rate Route Frequency Ordered Stop   06/12/15 1430  cefTRIAXone (ROCEPHIN) 2 g in dextrose 5 % 50 mL IVPB     2 g 100 mL/hr over 30 Minutes Intravenous Every 12 hours 06/12/15 1415     06/11/15 1900  cefTRIAXone (ROCEPHIN) 1 g in dextrose 5 % 50 mL IVPB     1 g 100 mL/hr over 30 Minutes Intravenous  Once 06/11/15 1855 06/11/15 2123   06/11/15 1900  vancomycin (VANCOCIN) IVPB 1000 mg/200 mL premix     1,000 mg 200 mL/hr over 60 Minutes Intravenous  Once 06/11/15 1855 06/11/15 2042      Medications   Scheduled Meds: .  acidophilus  1 capsule Oral Daily  . cefTRIAXone (ROCEPHIN)  IV  2 g Intravenous Q12H  . cholecalciferol  1,000 Units Oral Daily  . clonazePAM  1 mg Oral QHS  . docusate sodium  100 mg Oral BID  . heparin  5,000 Units Subcutaneous 3 times per day  . levothyroxine  75 mcg Oral QAC breakfast  . liothyronine  10 mcg Oral Daily  . mirtazapine  15 mg Oral QHS  . multivitamin with minerals  1 tablet Oral Daily  . pentafluoroprop-tetrafluoroeth   Topical Once  . risperiDONE  1 mg Oral QHS  . sodium chloride  3 mL Intravenous Q12H  . venlafaxine XR  150 mg Oral Daily  . venlafaxine XR  75 mg Oral Daily   Continuous Infusions: . sodium chloride 50 mL/hr at 06/13/15 1207   PRN Meds:.acetaminophen **OR** acetaminophen, ibuprofen, ketorolac, ondansetron **OR** ondansetron (ZOFRAN) IV, oxyCODONE   Data Review:   Micro Results Recent Results (from the past 240 hour(s))  Blood culture (routine x 2)     Status: None (Preliminary result)   Collection Time: 06/10/15  7:45 AM  Result Value Ref Range Status   Specimen Description BLOOD LEFT AC  Final   Special Requests BOTTLES DRAWN AEROBIC AND ANAEROBIC 2ML  Final   Culture NO GROWTH 1 DAY  Final   Report Status PENDING  Incomplete  Blood culture (routine x 2)     Status: None (Preliminary result)   Collection Time: 06/10/15  7:45 AM  Result Value Ref Range Status   Specimen Description BLOOD LEFT WRIST  Final   Special Requests   Final    BOTTLES DRAWN AEROBIC AND ANAEROBIC ANAEROBIC4ML AEROBIC 3ML   Culture NO GROWTH 1 DAY  Final   Report Status PENDING  Incomplete  Urine culture     Status: None   Collection Time: 06/10/15 10:10 AM  Result Value Ref Range Status   Specimen Description URINE, RANDOM  Final   Special Requests NONE  Final   Culture NO GROWTH  1 DAY  Final   Report Status 06/11/2015 FINAL  Final  Body fluid culture     Status: None (Preliminary result)   Collection Time: 06/12/15  8:25 PM  Result Value Ref Range Status    Specimen Description SYNOVIAL  Final   Special Requests Normal  Final   Gram Stain MODERATE WBC SEEN NO ORGANISMS SEEN   Final   Culture NO GROWTH < 12 HOURS  Final   Report Status PENDING  Incomplete    Radiology Reports Mr Cervical Spine W Wo Contrast  06/12/2015  CLINICAL DATA:  Neck pain weakness.  Headache.  Leukocytosis. EXAM: MRI CERVICAL SPINE WITHOUT AND WITH CONTRAST TECHNIQUE: Multiplanar and multiecho pulse sequences of the cervical spine, to include the craniocervical junction and cervicothoracic junction, were obtained according to standard protocol without and with intravenous contrast. CONTRAST:  43m MULTIHANCE GADOBENATE DIMEGLUMINE 529 MG/ML IV SOLN COMPARISON:  None. FINDINGS: Normal alignment of the cervical vertebral bodies. They demonstrate normal marrow signal except for patchy endplate reactive changes and a few small scattered hemangiomas. Patchy marrow signal in the clivus is probably due to the patient's age and osteoporosis. The cervical spinal cord demonstrates normal signal intensity. No cord lesions or syrinx. There is a small enhancing extra-axial lesion at the skullbase posteriorly which is most consistent with a meningioma. It measures approximately 9 mm. No mass effect on the upper cervical cord. C2-3:  No significant findings. C3-4: Mild annular bulge with slight impression on the ventral thecal sac. No significant spinal or foraminal stenosis. Mild left foraminal encroachment. C4-5: Mild bulging annulus and mild facet disease but no significant spinal or foraminal stenosis. C5-6: Bulging annulus, osteophytic ridging and uncinate spurring with mild flattening of the ventral thecal sac and narrowing of the ventral CSF space. Small focal medial foraminal disc protrusion could affect the left C6 nerve root. C6-7: Shallow broad-based disc protrusion with flattening of the ventral thecal sac and narrowing of the ventral CSF space. No significant foraminal stenosis.  C7-T1:  No significant findings. IMPRESSION: 1. Mild degenerative cervical spondylosis with multilevel disc disease and facet disease. No findings for diskitis, osteomyelitis or abscess. 2. 9 mm enhancing extra-axial lesion at the foramen magnum posteriorly most consistent with a benign meningioma. No mass effect on the upper cervical cord. 3. Mild spinal and left foraminal stenosis at C5-6. 4. Shallow broad-based disc protrusion at C6-7. Electronically Signed   By: PMarijo SanesM.D.   On: 06/12/2015 17:02   Dg Chest Portable 1 View  06/10/2015  CLINICAL DATA:  Generalized body pain in weakness. Patient had diagnosis of are viral infection last Wednesday EXAM: PORTABLE CHEST 1 VIEW COMPARISON:  None. FINDINGS: The heart size and mediastinal contours are within normal limits. Both lungs are clear. The visualized skeletal structures are unremarkable. IMPRESSION: No active cardiopulmonary disease. Electronically Signed   By: WAbelardo DieselM.D.   On: 06/10/2015 08:00   Dg Knee Right Port  06/12/2015  CLINICAL DATA:  Right knee pain. EXAM: PORTABLE RIGHT KNEE - 1-2 VIEW COMPARISON:  None. FINDINGS: No fracture or dislocation is noted. Moderate suprapatellar joint effusion is noted. Chondrocalcinosis is noted in the medial lateral joint spaces. No significant joint space narrowing is noted. IMPRESSION: Extensive chondrocalcinosis is noted median laterally suggesting degenerative joint disease or calcium pyrophosphate disease. Moderate suprapatellar joint effusion is noted. No fracture or dislocation is noted. Electronically Signed   By: JMarijo Conception M.D.   On: 06/12/2015 19:36     CBC  Recent Labs Lab 06/07/15 1533 06/10/15 0745 06/11/15 0904 06/13/15 0956  WBC 17.0 Repeated and verified X2.* 14.5* 10.3 11.7*  HGB 12.6 11.5* 10.2* 9.2*  HCT 39.2 35.0 30.9* 28.2*  PLT 430.0* 453* 439 456*  MCV 84.3 82.6 82.7 81.6  MCH  --  27.0 27.4 26.7  MCHC 32.2 32.8 33.1 32.7  RDW 14.4 14.1 14.4 13.9   LYMPHSABS 1.4 0.7*  --   --   MONOABS 1.9* 2.0*  --   --   EOSABS 0.0 0.0  --   --   BASOSABS 0.1 0.0  --   --     Chemistries   Recent Labs Lab 06/07/15 1533 06/10/15 0745  NA 135 133*  K 3.7 3.8  CL 101 100*  CO2 27 25  GLUCOSE 134* 139*  BUN 13 12  CREATININE 0.96 0.80  CALCIUM 10.3 10.0  AST 44* 83*  ALT 70* 103*  ALKPHOS 137* 203*  BILITOT 1.2 0.9   ------------------------------------------------------------------------------------------------------------------ estimated creatinine clearance is 58.5 mL/min (by C-G formula based on Cr of 0.8). ------------------------------------------------------------------------------------------------------------------  Recent Labs  06/10/15 1535  HGBA1C 6.1*   ------------------------------------------------------------------------------------------------------------------ No results for input(s): CHOL, HDL, LDLCALC, TRIG, CHOLHDL, LDLDIRECT in the last 72 hours. ------------------------------------------------------------------------------------------------------------------  Recent Labs  06/10/15 1535  TSH 3.395   ------------------------------------------------------------------------------------------------------------------ No results for input(s): VITAMINB12, FOLATE, FERRITIN, TIBC, IRON, RETICCTPCT in the last 72 hours.  Coagulation profile No results for input(s): INR, PROTIME in the last 168 hours.  No results for input(s): DDIMER in the last 72 hours.  Cardiac Enzymes  Recent Labs Lab 06/10/15 0745  TROPONINI <0.03   ------------------------------------------------------------------------------------------------------------------ Invalid input(s): POCBNP    Assessment & Plan   This is a 80 year old Caucasian female admitted for fever  # Sepsis Unknown etiology Consulted ID R knee fluid showed inflammation likely from arthritis. Waiting for cultures. Elevated ESR. Autoimmune? PMR? Consulted  and discussed with Dr. Jefm Bryant. On IV abx Ana pending. RF positive. MR cervical spine - Nothing acute  # Thyroid disease: Continue the same regimen as taking off at home TSH is normal  # Depression: The patient is on multiple anti-depressants and mood stabilizers. Continue per home regimen.  # DVT prophylaxis: Heparin      Code Status Orders        Start     Ordered   06/10/15 1452  Full code   Continuous     06/10/15 1451    Advance Directive Documentation        Most Recent Value   Type of Advance Directive  Healthcare Power of Attorney, Living will   Pre-existing out of facility DNR order (yellow form or pink MOST form)     "MOST" Form in Place?       Consults  ID/Ortho/Rheumatology DVT Prophylaxis   Heparin  Lab Results  Component Value Date   PLT 456* 06/13/2015    Time Spent in minutes   35 minutes  Hillary Bow R M.D on 06/13/2015 at 1:15 PM  Between 7am to 6pm - Pager - [phone]  After 6pm go to [email or web address] - password [email or web address] [facility]   Office  [phone]

## 2015-06-13 NOTE — Consult Note (Signed)
Reason for Consult: Joint pain  Referring Physician: Hospitalist  [name]   HPI: 79 year old white female. His stream osteoarthritis. Lumbar surgery. Left knee replacement previously. Recent hypercalcemia. Elevated parathyroid hormone.  calcium improved. 10 days ago had sudden onset of pain in the left knee. Following day she hurt her back. The following day she heard in multiple joints. Particularly neck. Right shoulder. Would her raise. Neck would hurt to turn it. Started hurting in both feet. Across the MTPs. Knee swelled more. Low-grade fever. No chills. No rash. No GI upset. Had elevated sedimentation rate to 58. Liver functions mildly elevated. Joints became worse and she was admitted. Had leukocytosis. Cultures negative. No significant improvement on antibiotics. Liver functions abnormal. Sedimentation rate up to 108. No recent travel. No other family members have been sick. Right knee arthrocentesis showed white count of 18,000 with positive CPPD crystals  PMH: Osteoarthritis. Hyperparathyroid. Prior DVT.  SURGICAL HISTORY: Left knee replacement. Lumbar surgery  Family History: Negative for rheumatoid arthritis  Social History: No significant alcohol or cigarettes  Allergies: No Known Allergies  Medications:  Scheduled: . acidophilus  1 capsule Oral Daily  . cefTRIAXone (ROCEPHIN)  IV  2 g Intravenous Q12H  . cholecalciferol  1,000 Units Oral Daily  . clonazePAM  1 mg Oral QHS  . docusate sodium  100 mg Oral BID  . heparin  5,000 Units Subcutaneous 3 times per day  . levothyroxine  75 mcg Oral QAC breakfast  . liothyronine  10 mcg Oral Daily  . methylPREDNISolone (SOLU-MEDROL) injection  80 mg Intravenous Daily  . mirtazapine  15 mg Oral QHS  . multivitamin with minerals  1 tablet Oral Daily  . pentafluoroprop-tetrafluoroeth   Topical Once  . risperiDONE  1 mg Oral QHS  . sodium chloride  3 mL Intravenous Q12H  . venlafaxine XR  150 mg Oral Daily  . venlafaxine  XR  75 mg Oral Daily        ROS: Chronic headaches but no new headache. No jaw pain. Morning stiffness. No shortness of breath. No cough. No abdominal pain. No diarrhea. No dysuria. No rashes.   PHYSICAL EXAM: Blood pressure 110/85, pulse 110, temperature 98.7 F (37.1 C), temperature source Oral, resp. rate 18, height 5\' 4"  (1.626 m), weight 75.206 kg (165 lb 12.8 oz), SpO2 93 %. Positive female. Seen lying in bed. Hurts to move her shoulder neck knee or feet. Oriented. No rash. Sclera clear. Clear chest. No murmur. No visceromegaly. No significant edema. Decreased range of motion of her cervical spine. Painful arc of motion of both shoulders. No definite shoulder effusion. Elbows without synovitis. Left wrist has pain with flexion and extension and is mildly swollen. Right wrist without synovitis. MTPs nontender PIPs not particular tender. Both hips move well. Right knee effusion. Left knee is been operated no effusion. MTPs are tender. Good range of motion both ankles. No definite dactylitis.  Assessment: Acute inflammatory arthritis involving right knee shoulders cervical spine left wrist and MTPs. Question sudden onset rheumatoid arthritis versus viral syndrome versus pseudogout. Atypical for pseudogout given the distribution of joints Low-grade fever probably related to the above New abnormal liver functions unclear etiology. Rule out viral. Recent elevated PTH  Recommendations: CMV EBV hepatitis B and C ferritin IV steroids beginning tonight If improved then may go to by mouth and gradually taper If right knee not improved then we can consider injecting I would be happy to see in follow-up as outpatient  Leeanne Mannan, Iona Beard W  06/13/2015, 6:37 PM

## 2015-06-13 NOTE — Progress Notes (Signed)
  Subjective:    Right knee aspiration Patient reports pain as Unable to obtain pain score this AM, patient very somnolent..   Patient is well, and has had no acute complaints or problems Negative for chest pain and shortness of breath Fever: Most recent temp 100 Gastrointestinal:Negative for nausea and vomiting  Objective: Vital signs in last 24 hours: Temp:  [98.2 F (36.8 C)-100.3 F (37.9 C)] 100 F (37.8 C) (12/28 0338) Pulse Rate:  [99-113] 112 (12/28 0338) Resp:  [18] 18 (12/28 0338) BP: (123-145)/(58-70) 130/64 mmHg (12/28 0338) SpO2:  [91 %-97 %] 91 % (12/28 0338) Weight:  [75.206 kg (165 lb 12.8 oz)] 75.206 kg (165 lb 12.8 oz) (12/28 0338)  Intake/Output from previous day:  Intake/Output Summary (Last 24 hours) at 06/13/15 0737 Last data filed at 06/13/15 0400  Gross per 24 hour  Intake 1545.5 ml  Output      0 ml  Net 1545.5 ml    Intake/Output this shift:    Labs:  Recent Labs  06/10/15 0745 06/11/15 0904  HGB 11.5* 10.2*    Recent Labs  06/10/15 0745 06/11/15 0904  WBC 14.5* 10.3  RBC 4.23 3.73*  HCT 35.0 30.9*  PLT 453* 439    Recent Labs  06/10/15 0745  NA 133*  K 3.8  CL 100*  CO2 25  BUN 12  CREATININE 0.80  GLUCOSE 139*  CALCIUM 10.0   No results for input(s): LABPT, INR in the last 72 hours.   EXAM General - Patient is drowsy, sleeping for exam.  Appears in no pain. Extremity - Right knee has mild effusion still present with mild swelling.  Knee aspiration performed on 06/12/15.  No erythema or increased warmth.  No skin breakdown.  Stable to varus and valgus stress testing.  Pt is intact to light touch to the right lower extremity.  No pain with palpation of the right knee.     Past Medical History  Diagnosis Date  . Hyperlipidemia   . Recurrent major depression ([place])   . Hypothyroidism   . DVT (deep venous thrombosis) ([place]) 2012    after TKR  . Osteoarthritis, knee   . IBS (irritable bowel syndrome)   .  Diverticulosis     Assessment/Plan:     Active Problems:   Viral sepsis  Estimated body mass index is 28.45 kg/(m^2) as calculated from the following:   Height as of this encounter: 5\' 4"  (1.626 m).   Weight as of this encounter: 75.206 kg (165 lb 12.8 oz).  Right knee aspiration performed yesterday.  25 mL of cloudy yellow fluid obtained, sent for gram stain, cultures, synovial cell count with diff and crystal evaluation.  Awaiting results of this before any plans for surgical intervention.  DVT Prophylaxis - TED hose and heparin. Weight-Bearing as tolerated to right leg  J. Cameron Proud, PA-C [facility] Orthopaedic Surgery 06/13/2015, 7:37 AM

## 2015-06-13 NOTE — Progress Notes (Signed)
[facility] INFECTIOUS DISEASE PROGRESS NOTE Date of Admission:  06/10/2015     ID: Wendy Murray is a 79 y.o. female with fevers, myalgias,   Active Problems:   Viral sepsis   Subjective: Still aches all over, shoulders, neck, back, legs. Low grade fevers. Has not gotten oob yet.   ROS  Eleven systems are reviewed and negative except per hpi  Medications:  Antibiotics Given (last 72 hours)    Date/Time Action Medication Dose Rate   06/11/15 1942 Given   vancomycin (VANCOCIN) IVPB 1000 mg/200 mL premix 1,000 mg 200 mL/hr   06/11/15 2053 Given   cefTRIAXone (ROCEPHIN) 1 g in dextrose 5 % 50 mL IVPB 1 g 100 mL/hr   06/12/15 1436 Given   cefTRIAXone (ROCEPHIN) 2 g in dextrose 5 % 50 mL IVPB 2 g 100 mL/hr   06/12/15 2108 Given   cefTRIAXone (ROCEPHIN) 2 g in dextrose 5 % 50 mL IVPB 2 g 100 mL/hr   06/13/15 4585 Given   cefTRIAXone (ROCEPHIN) 2 g in dextrose 5 % 50 mL IVPB 2 g 100 mL/hr     . acidophilus  1 capsule Oral Daily  . cefTRIAXone (ROCEPHIN)  IV  2 g Intravenous Q12H  . cholecalciferol  1,000 Units Oral Daily  . clonazePAM  1 mg Oral QHS  . docusate sodium  100 mg Oral BID  . heparin  5,000 Units Subcutaneous 3 times per day  . levothyroxine  75 mcg Oral QAC breakfast  . liothyronine  10 mcg Oral Daily  . mirtazapine  15 mg Oral QHS  . multivitamin with minerals  1 tablet Oral Daily  . pentafluoroprop-tetrafluoroeth   Topical Once  . risperiDONE  1 mg Oral QHS  . sodium chloride  3 mL Intravenous Q12H  . venlafaxine XR  150 mg Oral Daily  . venlafaxine XR  75 mg Oral Daily    Objective: Vital signs in last 24 hours: Temp:  [97.8 F (36.6 C)-100.3 F (37.9 C)] 98.4 F (36.9 C) (12/28 1224) Pulse Rate:  [95-113] 95 (12/28 1224) Resp:  [18] 18 (12/28 1224) BP: (116-135)/(58-64) 116/60 mmHg (12/28 1224) SpO2:  [91 %-97 %] 95 % (12/28 1224) Weight:  [75.206 kg (165 lb 12.8 oz)] 75.206 kg (165 lb 12.8 oz) (12/28 0338) Constitutional: oriented to  person, place, and time. appears acutely ill, is warm to touch, in pain  HENT: [place]/AT, PERRLA, no scleral icterus Mouth/Throat: Oropharynx is clear and moist. No oropharyngeal exudate. No dental lesions Cardiovascular: Tachy, reg, no murmur Pulmonary/Chest: Effort normal and breath sounds normal. No respiratory distress. has no wheezes.  Neck - Less pain with neck flexion , is able to move freely with passive motion Abdominal: Soft. Bowel sounds are normal. exhibits no distension. There is no tenderness.  Lymphadenopathy: no cervical adenopathy. No axillary adenopathy Neurological: alert and oriented to person, place, and time.  Skin: Skin is hot and dry. No rash noted. No erythema. NO peripheral stigmata of endocarditis Psychiatric: a normal mood and affect. behavior is normal.  EXT no edema. R knee is still swollen, she will not let me flex past 30degrees de to pain L knee TKR scar wnl  Lab Results  Recent Labs  06/11/15 0904 06/13/15 0956  WBC 10.3 11.7*  HGB 10.2* 9.2*  HCT 30.9* 28.2*    Microbiology: Results for orders placed or performed during the hospital encounter of 06/10/15  Blood culture (routine x 2)     Status: None (Preliminary result)  Collection Time: 06/10/15  7:45 AM  Result Value Ref Range Status   Specimen Description BLOOD LEFT AC  Final   Special Requests BOTTLES DRAWN AEROBIC AND ANAEROBIC 2ML  Final   Culture NO GROWTH 3 DAYS  Final   Report Status PENDING  Incomplete  Blood culture (routine x 2)     Status: None (Preliminary result)   Collection Time: 06/10/15  7:45 AM  Result Value Ref Range Status   Specimen Description BLOOD LEFT WRIST  Final   Special Requests   Final    BOTTLES DRAWN AEROBIC AND ANAEROBIC ANAEROBIC4ML AEROBIC 3ML   Culture NO GROWTH 3 DAYS  Final   Report Status PENDING  Incomplete  Urine culture     Status: None   Collection Time: 06/10/15 10:10 AM  Result Value Ref Range Status   Specimen Description URINE,  RANDOM  Final   Special Requests NONE  Final   Culture NO GROWTH 1 DAY  Final   Report Status 06/11/2015 FINAL  Final  Body fluid culture     Status: None (Preliminary result)   Collection Time: 06/12/15  8:25 PM  Result Value Ref Range Status   Specimen Description SYNOVIAL  Final   Special Requests Normal  Final   Gram Stain MODERATE WBC SEEN NO ORGANISMS SEEN   Final   Culture NO GROWTH < 12 HOURS  Final   Report Status PENDING  Incomplete    Studies/Results: Mr Cervical Spine W Wo Contrast  06/12/2015  CLINICAL DATA:  Neck pain weakness.  Headache.  Leukocytosis. EXAM: MRI CERVICAL SPINE WITHOUT AND WITH CONTRAST TECHNIQUE: Multiplanar and multiecho pulse sequences of the cervical spine, to include the craniocervical junction and cervicothoracic junction, were obtained according to standard protocol without and with intravenous contrast. CONTRAST:  60m MULTIHANCE GADOBENATE DIMEGLUMINE 529 MG/ML IV SOLN COMPARISON:  None. FINDINGS: Normal alignment of the cervical vertebral bodies. They demonstrate normal marrow signal except for patchy endplate reactive changes and a few small scattered hemangiomas. Patchy marrow signal in the clivus is probably due to the patient's age and osteoporosis. The cervical spinal cord demonstrates normal signal intensity. No cord lesions or syrinx. There is a small enhancing extra-axial lesion at the skullbase posteriorly which is most consistent with a meningioma. It measures approximately 9 mm. No mass effect on the upper cervical cord. C2-3:  No significant findings. C3-4: Mild annular bulge with slight impression on the ventral thecal sac. No significant spinal or foraminal stenosis. Mild left foraminal encroachment. C4-5: Mild bulging annulus and mild facet disease but no significant spinal or foraminal stenosis. C5-6: Bulging annulus, osteophytic ridging and uncinate spurring with mild flattening of the ventral thecal sac and narrowing of the ventral CSF  space. Small focal medial foraminal disc protrusion could affect the left C6 nerve root. C6-7: Shallow broad-based disc protrusion with flattening of the ventral thecal sac and narrowing of the ventral CSF space. No significant foraminal stenosis. C7-T1:  No significant findings. IMPRESSION: 1. Mild degenerative cervical spondylosis with multilevel disc disease and facet disease. No findings for diskitis, osteomyelitis or abscess. 2. 9 mm enhancing extra-axial lesion at the foramen magnum posteriorly most consistent with a benign meningioma. No mass effect on the upper cervical cord. 3. Mild spinal and left foraminal stenosis at C5-6. 4. Shallow broad-based disc protrusion at C6-7. Electronically Signed   By: PMarijo SanesM.D.   On: 06/12/2015 17:02   Dg Knee Right Port  06/12/2015  CLINICAL DATA:  Right knee pain. EXAM:  PORTABLE RIGHT KNEE - 1-2 VIEW COMPARISON:  None. FINDINGS: No fracture or dislocation is noted. Moderate suprapatellar joint effusion is noted. Chondrocalcinosis is noted in the medial lateral joint spaces. No significant joint space narrowing is noted. IMPRESSION: Extensive chondrocalcinosis is noted median laterally suggesting degenerative joint disease or calcium pyrophosphate disease. Moderate suprapatellar joint effusion is noted. No fracture or dislocation is noted. Electronically Signed   By: Marijo Conception, M.D.   On: 06/12/2015 19:36    Assessment/Plan: Wendy Murray is a 79 y.o. female with one week myalgias, fevers, neck pain and R knee pain. WU so far negative.  Flu neg, UA neg, LFTs mildly elevated. ESR 105, CPK nml, CXR negative. Monospot negative.  RF +,   WBC with neutrophilia but improving. Has thrombocytosis.  [place] 12/25 ngtd, ucx neg. Joint cx NGTD MRI C spine neg for discitis or osteomyelitis Had tap R knee by ortho with 18 K WBC and has CPPD crystals - cx pending but this is more consistent with inflammatory as opposed to infectious arthritis.    RECS Cont ceftriaxone and Vanco for now but if bcx remain negative can likely deesclate in next 1-2 days Agree with rheum evaluation and perhaps trial of steroids IF repeat fever will need LP  Thank you very much for the consult. Will follow with you.  [place], [place]   06/13/2015, 3:35 PM

## 2015-06-14 LAB — COMPREHENSIVE METABOLIC PANEL
ALK PHOS: 290 U/L — AB (ref 38–126)
ALT: 57 U/L — AB (ref 14–54)
ANION GAP: 7 (ref 5–15)
AST: 25 U/L (ref 15–41)
Albumin: 2.2 g/dL — ABNORMAL LOW (ref 3.5–5.0)
BILIRUBIN TOTAL: 0.3 mg/dL (ref 0.3–1.2)
BUN: 12 mg/dL (ref 6–20)
CALCIUM: 9.7 mg/dL (ref 8.9–10.3)
CO2: 27 mmol/L (ref 22–32)
CREATININE: 0.76 mg/dL (ref 0.44–1.00)
Chloride: 105 mmol/L (ref 101–111)
Glucose, Bld: 169 mg/dL — ABNORMAL HIGH (ref 65–99)
Potassium: 3.6 mmol/L (ref 3.5–5.1)
Sodium: 139 mmol/L (ref 135–145)
TOTAL PROTEIN: 6.3 g/dL — AB (ref 6.5–8.1)

## 2015-06-14 LAB — CBC WITH DIFFERENTIAL/PLATELET
BASOS ABS: 0 10*3/uL (ref 0–0.1)
BASOS PCT: 0 %
EOS ABS: 0 10*3/uL (ref 0–0.7)
Eosinophils Relative: 0 %
HCT: 28.9 % — ABNORMAL LOW (ref 35.0–47.0)
HEMOGLOBIN: 9.7 g/dL — AB (ref 12.0–16.0)
Lymphocytes Relative: 4 %
Lymphs Abs: 0.7 10*3/uL — ABNORMAL LOW (ref 1.0–3.6)
MCH: 27.8 pg (ref 26.0–34.0)
MCHC: 33.4 g/dL (ref 32.0–36.0)
MCV: 83.2 fL (ref 80.0–100.0)
MONOS PCT: 5 %
Monocytes Absolute: 0.8 10*3/uL (ref 0.2–0.9)
NEUTROS ABS: 14.2 10*3/uL — AB (ref 1.4–6.5)
NEUTROS PCT: 91 %
Platelets: 508 10*3/uL — ABNORMAL HIGH (ref 150–440)
RBC: 3.47 MIL/uL — AB (ref 3.80–5.20)
RDW: 14.1 % (ref 11.5–14.5)
WBC: 15.7 10*3/uL — AB (ref 3.6–11.0)

## 2015-06-14 LAB — FERRITIN: Ferritin: 440 ng/mL — ABNORMAL HIGH (ref 11–307)

## 2015-06-14 LAB — SEDIMENTATION RATE: SED RATE: 107 mm/h — AB (ref 0–30)

## 2015-06-14 NOTE — Consult Note (Signed)
Follow-up arthritis Joints are better. Able to move her head without pain. Right knee is less swollen. Left wrist is not hurting her. Sedimentation rate still elevated. Elevation of liver functions. No abdominal pain Exam: Afebrile ,Good range of motion of cervical spine. Minimal effusion right knee. Left wrist without synovitis. MTPs nontender. Mild painful arc of motion of her shoulders Impression: imProved inflammatory arthritis. Could've all been CPPD                     Abnormal liver functions. ? viral syndrome. Rule out malignancy.                    Hyperparathyroid Recommendations: Stop IV Solu-Medrol. Prednisone taper. 60 down to 0 over 7-10 days.                    Happy to see in the office if she has recurrent joint pain                     Agree with workup of abnormal liver functions

## 2015-06-14 NOTE — Evaluation (Signed)
Physical Therapy Evaluation Patient Details Name: Wendy Murray MRN: [id number] DOB: 05-31-1936 Today's Date: 06/14/2015   History of Present Illness  Pt is a 80 y.o. female presenting to hospital with general malaise; general aches/pains; tachycardic.  Pt admitted with fever and viral syndrome.  R knee ROM initially limited -10 to 40 degres and with R knee effusion; pt s/p R knee aspiration 06/12/15 (inflammation likely caused by arthritis).  PMH includes L TKA and L-spine fusion, DVT 2012, and hammer toe surgery 5/16.  Clinical Impression  Prior to admission, pt was independent without AD (pt using RW just prior to admission d/t pain and difficulty walking).  Pt lives at the [place] [facility] [place] (1 level; no stairs).  Currently pt is SBA supine to sit; CGA sit to/from stand with RW; and CGA ambulating 40 feet with RW.  Pt initially reporting 5/10 pain in knees with ambulation but minimal pain by end of ambulation distance (pt reported it felt good to stretch out and move).  Pt would benefit from skilled PT to address noted impairments and functional limitations.  Recommend pt discharge to home with support of husband, use of RW, and HHPT when medically appropriate.     Follow Up Recommendations Home health PT    Equipment Recommendations       Recommendations for Other Services       Precautions / Restrictions Precautions Precautions: Fall Restrictions Weight Bearing Restrictions: Yes RLE Weight Bearing: Weight bearing as tolerated Other Position/Activity Restrictions: WBAT per ortho note      Mobility  Bed Mobility Overal bed mobility: Needs Assistance Bed Mobility: Supine to Sit     Supine to sit: Supervision;HOB elevated     General bed mobility comments: increased effort and time required  Transfers Overall transfer level: Needs assistance Equipment used: Rolling walker (2 wheeled) Transfers: Sit to/from Stand Sit to Stand: Min guard         General  transfer comment: steady without loss of balance  Ambulation/Gait Ambulation/Gait assistance: Min guard Ambulation Distance (Feet): 40 Feet Assistive device: Rolling walker (2 wheeled)   Gait velocity: decreased   General Gait Details: mild decreased stance time R LE; pt reporting 5/10 pain beginning of ambulation (in knees and back) but minimal pain by end of ambulation; decreased B step length/foot clearance/heelstrike; steady without loss of balance  Stairs            Wheelchair Mobility    Modified Rankin (Stroke Patients Only)       Balance Overall balance assessment: Needs assistance Sitting-balance support: No upper extremity supported;Feet supported Sitting balance-Leahy Scale: Normal     Standing balance support: Bilateral upper extremity supported (on RW) Standing balance-Leahy Scale: Good                               Pertinent Vitals/Pain Pain Assessment: 0-10 Pain Score: 5  Pain Location: low back; R>L knee Pain Descriptors / Indicators: Sore;Aching Pain Intervention(s): Limited activity within patient's tolerance;Monitored during session;Repositioned  Vitals stable and WFL throughout treatment session.    Home Living Family/patient expects to be discharged to:: Private residence Living Arrangements: Spouse/significant other Available Help at Discharge: Family Type of Home: Independent living facility ([facility]) Home Access: Level entry     Home Layout: [place]: Environmental consultant - 2 wheels;Walker - 4 wheels;Bedside commode      Prior Function Level of Independence: Independent  Comments: Pt requiring use of RW just prior to admission d/t pain and difficulty ambulating.     Hand Dominance        Extremity/Trunk Assessment   Upper Extremity Assessment: Generalized weakness           Lower Extremity Assessment: Generalized weakness         Communication   Communication: No difficulties   Cognition Arousal/Alertness: Awake/alert Behavior During Therapy: WFL for tasks assessed/performed Overall Cognitive Status: Within Functional Limits for tasks assessed                      General Comments   Nursing cleared pt for participation in physical therapy.  Pt agreeable to PT session.  Pt's husband left for mobility part of session.    Exercises Total Joint Exercises Goniometric ROM: R knee extension 8 degrees short of neutral semi-supine in bed; R knee flexion 120 degrees semi-supine in bed      Assessment/Plan    PT Assessment Patient needs continued PT services  PT Diagnosis Difficulty walking;Acute pain   PT Problem List Decreased balance;Decreased mobility;Pain  PT Treatment Interventions DME instruction;Gait training;Functional mobility training;Therapeutic activities;Therapeutic exercise;Balance training;Patient/family education   PT Goals (Current goals can be found in the Care Plan section) Acute Rehab PT Goals Patient Stated Goal: to go home PT Goal Formulation: With patient Time For Goal Achievement: 06/28/15 Potential to Achieve Goals: Good    Frequency Min 2X/week   Barriers to discharge        Co-evaluation               End of Session Equipment Utilized During Treatment: Gait belt Activity Tolerance: Patient tolerated treatment well Patient left: in chair;with call bell/phone within reach;with chair alarm set;with SCD's reapplied Nurse Communication: Mobility status         Time: 1455-1525 PT Time Calculation (min) (ACUTE ONLY): 30 min   Charges:   PT Evaluation $Initial PT Evaluation Tier I: 1 Procedure PT Treatments $Therapeutic Activity: 8-22 mins   PT G CodesLeitha Bleak 06/27/2015, 3:37 PM Leitha Bleak, [place]

## 2015-06-14 NOTE — Consult Note (Signed)
ORTHOPAEDICS PROGRESS NOTE  PATIENT NAME: Wendy Murray DOB: 11-16-1935  MRN: [id number]  Subjective: Resting well.  Guarding with attempted right knee movement.  Objective: Vital signs in last 24 hours: Temp:  [97.4 F (36.3 C)-99.7 F (37.6 C)] 97.4 F (36.3 C) (12/29 0729) Pulse Rate:  [91-120] 91 (12/29 0729) Resp:  [18-21] 19 (12/29 0624) BP: (110-143)/(60-85) 133/64 mmHg (12/29 0729) SpO2:  [89 %-95 %] 90 % (12/29 0729) Weight:  [74.481 kg (164 lb 3.2 oz)] 74.481 kg (164 lb 3.2 oz) (12/29 0500)  Intake/Output from previous day: 12/28 0701 - 12/29 0700 In: 2020 [P.O.:720; I.V.:1200; IV Piggyback:100] Out: 150 [Urine:150]   Recent Labs  06/11/15 0904 06/13/15 0956 06/14/15 0442  WBC 10.3 11.7* 15.7*  HGB 10.2* 9.2* 9.7*  HCT 30.9* 28.2* 28.9*  PLT 439 456* 508*  K  --   --  3.6  CL  --   --  105  CO2  --   --  27  BUN  --   --  12  CREATININE  --   --  0.76  GLUCOSE  --   --  169*  CALCIUM  --   --  9.7    EXAM General: Awake, alert, and oriented Right lower extremity: Still with soft tissue swelling to right knee but no erythema or increased warmth. Neurologic: Neurovascular function is intact.   Assessment: Right knee effusion Possible inflammatory arthritis  Plan: Labs reviewed. (+) RF and ANA. Knee cultures with NO GROWTH thus far. Dr Lerry Paterson note reviewed and I would agree with his assessment. Will continue to follow.  James P. Holley Bouche M.D.

## 2015-06-14 NOTE — Care Management Important Message (Signed)
Important Message  Patient Details  Name: Wendy Murray MRN: [id number] Date of Birth: [birth year]   Medicare Important Message Given:  Yes    Juliann Pulse A Dj [place]eno 06/14/2015, 9:39 AM

## 2015-06-14 NOTE — Progress Notes (Signed)
ID E note Reviewed Dr Benjiman Core note. Agree with wu. Check abd uss given elevated alk phos

## 2015-06-14 NOTE — Progress Notes (Signed)
Spoke with U/S tech,  Pt should be NPO after midnight for U/S first thing in the AM.

## 2015-06-14 NOTE — Progress Notes (Signed)
[place] at [facility]                                                                                                                                                                                            Patient Demographics   Wendy Murray, is a 79 y.o. female, DOB - [birth year], XNT:[id number]  Admit date - 06/10/2015   Admitting Physician Harrie Foreman, MD  Outpatient Primary MD for the patient is [name], MD   LOS - 4  Subjective  Patient continues to complain of generalized aches and pains. Shoulders, neck, headache, back. Some improvement with steroids. Denies any cough, abdominal pain nausea vomiting or diarrhea denies any urinary symptoms. Right knee pain still present. Had Right knee tap 12/27. Afebrile now.   Review of Systems:   CONSTITUTIONAL: No documented fever. Positive fatigue and weakness. No weight gain, no weight loss.  EYES: No blurry or double vision.  ENT: No tinnitus. No postnasal drip. No redness of the oropharynx.  RESPIRATORY: No cough, no wheeze, no hemoptysis. No dyspnea.  CARDIOVASCULAR: No chest pain. No orthopnea. No palpitations. No syncope.  GASTROINTESTINAL: No nausea, no vomiting or diarrhea. No abdominal pain. No melena or hematochezia.  GENITOURINARY: No dysuria or hematuria.  ENDOCRINE: No polyuria or nocturia. No heat or cold intolerance.  HEMATOLOGY: No anemia. No bruising. No bleeding.  INTEGUMENTARY: No rashes. No lesions.  MUSCULOSKELETAL: No arthritis. No swelling. No gout.  NEUROLOGIC: No numbness, tingling, or ataxia. No seizure-type activity.  PSYCHIATRIC: No anxiety. No insomnia. No ADD.    Vitals:   Filed Vitals:   06/13/15 2355 06/14/15 0500 06/14/15 0624 06/14/15 0729  BP: 143/73  131/64 133/64  Pulse: 107  91 91  Temp: 98.4 F (36.9 C)  97.5 F (36.4 C) 97.4 F (36.3 C)  TempSrc: Oral  Oral Oral  Resp: 21  19   Height:      Weight:  74.481 kg (164 lb 3.2 oz)     SpO2: 91%  89% 90%    Wt Readings from Last 3 Encounters:  06/14/15 74.481 kg (164 lb 3.2 oz)  06/07/15 73.029 kg (161 lb)  06/06/15 75.07 kg (165 lb 8 oz)     Intake/Output Summary (Last 24 hours) at 06/14/15 1215 Last data filed at 06/14/15 0800  Gross per 24 hour  Intake   1780 ml  Output    150 ml  Net   1630 ml    Physical Exam:   GENERAL: Pleasant-appearing in no apparent distress.  HEAD, EYES, EARS, NOSE AND THROAT: Atraumatic, normocephalic. Extraocular muscles are intact. Pupils equal  and reactive to light. Sclerae anicteric. No conjunctival injection. No oro-pharyngeal erythema.  NECK: Supple. There is no jugular venous distention. No bruits, no lymphadenopathy, no thyromegaly.  HEART: Regular rate and rhythm,. No murmurs, no rubs, no clicks.  LUNGS: Clear to auscultation bilaterally. No rales or rhonchi. No wheezes.  ABDOMEN: Soft, flat, nontender, nondistended. Has good bowel sounds. No hepatosplenomegaly appreciated.  EXTREMITIES: No evidence of any cyanosis, clubbing, or peripheral edema.  +2 pedal and radial pulses bilaterally.  NEUROLOGIC: The patient is alert, awake, and oriented x3 with no focal motor or sensory deficits appreciated bilaterally.  SKIN: Moist and warm with no rashes appreciated.  Psych: Not anxious, depressed LN: No inguinal LN enlargement Right knee warm    Antibiotics   Anti-infectives    Start     Dose/Rate Route Frequency Ordered Stop   06/12/15 1430  cefTRIAXone (ROCEPHIN) 2 g in dextrose 5 % 50 mL IVPB     2 g 100 mL/hr over 30 Minutes Intravenous Every 12 hours 06/12/15 1415     06/11/15 1900  cefTRIAXone (ROCEPHIN) 1 g in dextrose 5 % 50 mL IVPB     1 g 100 mL/hr over 30 Minutes Intravenous  Once 06/11/15 1855 06/11/15 2123   06/11/15 1900  vancomycin (VANCOCIN) IVPB 1000 mg/200 mL premix     1,000 mg 200 mL/hr over 60 Minutes Intravenous  Once 06/11/15 1855 06/11/15 2042      Medications   Scheduled Meds: . acidophilus   1 capsule Oral Daily  . cefTRIAXone (ROCEPHIN)  IV  2 g Intravenous Q12H  . cholecalciferol  1,000 Units Oral Daily  . clonazePAM  1 mg Oral QHS  . docusate sodium  100 mg Oral BID  . heparin  5,000 Units Subcutaneous 3 times per day  . levothyroxine  75 mcg Oral QAC breakfast  . liothyronine  10 mcg Oral Daily  . methylPREDNISolone (SOLU-MEDROL) injection  80 mg Intravenous Daily  . mirtazapine  15 mg Oral QHS  . multivitamin with minerals  1 tablet Oral Daily  . pentafluoroprop-tetrafluoroeth   Topical Once  . risperiDONE  1 mg Oral QHS  . sodium chloride  3 mL Intravenous Q12H  . venlafaxine XR  150 mg Oral Daily  . venlafaxine XR  75 mg Oral Daily   Continuous Infusions:   PRN Meds:.acetaminophen **OR** acetaminophen, ibuprofen, ketorolac, ondansetron **OR** ondansetron (ZOFRAN) IV, oxyCODONE   Data Review:   Micro Results Recent Results (from the past 240 hour(s))  Blood culture (routine x 2)     Status: None (Preliminary result)   Collection Time: 06/10/15  7:45 AM  Result Value Ref Range Status   Specimen Description BLOOD LEFT AC  Final   Special Requests BOTTLES DRAWN AEROBIC AND ANAEROBIC 2ML  Final   Culture NO GROWTH 3 DAYS  Final   Report Status PENDING  Incomplete  Blood culture (routine x 2)     Status: None (Preliminary result)   Collection Time: 06/10/15  7:45 AM  Result Value Ref Range Status   Specimen Description BLOOD LEFT WRIST  Final   Special Requests   Final    BOTTLES DRAWN AEROBIC AND ANAEROBIC ANAEROBIC4ML AEROBIC 3ML   Culture NO GROWTH 3 DAYS  Final   Report Status PENDING  Incomplete  Urine culture     Status: None   Collection Time: 06/10/15 10:10 AM  Result Value Ref Range Status   Specimen Description URINE, RANDOM  Final   Special Requests NONE  Final  Culture NO GROWTH 1 DAY  Final   Report Status 06/11/2015 FINAL  Final  Body fluid culture     Status: None (Preliminary result)   Collection Time: 06/12/15  8:25 PM  Result Value  Ref Range Status   Specimen Description SYNOVIAL  Final   Special Requests Normal  Final   Gram Stain MODERATE WBC SEEN NO ORGANISMS SEEN   Final   Culture NO GROWTH 1 DAY  Final   Report Status PENDING  Incomplete    Radiology Reports Mr Cervical Spine W Wo Contrast  06/12/2015  CLINICAL DATA:  Neck pain weakness.  Headache.  Leukocytosis. EXAM: MRI CERVICAL SPINE WITHOUT AND WITH CONTRAST TECHNIQUE: Multiplanar and multiecho pulse sequences of the cervical spine, to include the craniocervical junction and cervicothoracic junction, were obtained according to standard protocol without and with intravenous contrast. CONTRAST:  62m MULTIHANCE GADOBENATE DIMEGLUMINE 529 MG/ML IV SOLN COMPARISON:  None. FINDINGS: Normal alignment of the cervical vertebral bodies. They demonstrate normal marrow signal except for patchy endplate reactive changes and a few small scattered hemangiomas. Patchy marrow signal in the clivus is probably due to the patient's age and osteoporosis. The cervical spinal cord demonstrates normal signal intensity. No cord lesions or syrinx. There is a small enhancing extra-axial lesion at the skullbase posteriorly which is most consistent with a meningioma. It measures approximately 9 mm. No mass effect on the upper cervical cord. C2-3:  No significant findings. C3-4: Mild annular bulge with slight impression on the ventral thecal sac. No significant spinal or foraminal stenosis. Mild left foraminal encroachment. C4-5: Mild bulging annulus and mild facet disease but no significant spinal or foraminal stenosis. C5-6: Bulging annulus, osteophytic ridging and uncinate spurring with mild flattening of the ventral thecal sac and narrowing of the ventral CSF space. Small focal medial foraminal disc protrusion could affect the left C6 nerve root. C6-7: Shallow broad-based disc protrusion with flattening of the ventral thecal sac and narrowing of the ventral CSF space. No significant foraminal  stenosis. C7-T1:  No significant findings. IMPRESSION: 1. Mild degenerative cervical spondylosis with multilevel disc disease and facet disease. No findings for diskitis, osteomyelitis or abscess. 2. 9 mm enhancing extra-axial lesion at the foramen magnum posteriorly most consistent with a benign meningioma. No mass effect on the upper cervical cord. 3. Mild spinal and left foraminal stenosis at C5-6. 4. Shallow broad-based disc protrusion at C6-7. Electronically Signed   By: PMarijo SanesM.D.   On: 06/12/2015 17:02   Dg Chest Portable 1 View  06/10/2015  CLINICAL DATA:  Generalized body pain in weakness. Patient had diagnosis of are viral infection last Wednesday EXAM: PORTABLE CHEST 1 VIEW COMPARISON:  None. FINDINGS: The heart size and mediastinal contours are within normal limits. Both lungs are clear. The visualized skeletal structures are unremarkable. IMPRESSION: No active cardiopulmonary disease. Electronically Signed   By: WAbelardo DieselM.D.   On: 06/10/2015 08:00   Dg Knee Right Port  06/12/2015  CLINICAL DATA:  Right knee pain. EXAM: PORTABLE RIGHT KNEE - 1-2 VIEW COMPARISON:  None. FINDINGS: No fracture or dislocation is noted. Moderate suprapatellar joint effusion is noted. Chondrocalcinosis is noted in the medial lateral joint spaces. No significant joint space narrowing is noted. IMPRESSION: Extensive chondrocalcinosis is noted median laterally suggesting degenerative joint disease or calcium pyrophosphate disease. Moderate suprapatellar joint effusion is noted. No fracture or dislocation is noted. Electronically Signed   By: JMarijo Conception M.D.   On: 06/12/2015 19:36  CBC  Recent Labs Lab 06/07/15 1533 06/10/15 0745 06/11/15 0904 06/13/15 0956 06/14/15 0442  WBC 17.0 Repeated and verified X2.* 14.5* 10.3 11.7* 15.7*  HGB 12.6 11.5* 10.2* 9.2* 9.7*  HCT 39.2 35.0 30.9* 28.2* 28.9*  PLT 430.0* 453* 439 456* 508*  MCV 84.3 82.6 82.7 81.6 83.2  MCH  --  27.0 27.4 26.7 27.8   MCHC 32.2 32.8 33.1 32.7 33.4  RDW 14.4 14.1 14.4 13.9 14.1  LYMPHSABS 1.4 0.7*  --   --  0.7*  MONOABS 1.9* 2.0*  --   --  0.8  EOSABS 0.0 0.0  --   --  0.0  BASOSABS 0.1 0.0  --   --  0.0    Chemistries   Recent Labs Lab 06/07/15 1533 06/10/15 0745 06/14/15 0442  NA 135 133* 139  K 3.7 3.8 3.6  CL 101 100* 105  CO2 _0 GLUCOSE 134* 139* 169*  BUN _1 CREATININE 0.96 0.80 0.76  CALCIUM 10.3 10.0 9.7  AST 44* 83* 25  ALT 70* 103* 57*  ALKPHOS 137* 203* 290*  BILITOT 1.2 0.9 0.3   ------------------------------------------------------------------------------------------------------------------ estimated creatinine clearance is 58.2 mL/min (by C-G formula based on Cr of 0.76). ------------------------------------------------------------------------------------------------------------------ No results for input(s): HGBA1C in the last 72 hours. ------------------------------------------------------------------------------------------------------------------ No results for input(s): CHOL, HDL, LDLCALC, TRIG, CHOLHDL, LDLDIRECT in the last 72 hours. ------------------------------------------------------------------------------------------------------------------ No results for input(s): TSH, T4TOTAL, T3FREE, THYROIDAB in the last 72 hours.  Invalid input(s): FREET3 ------------------------------------------------------------------------------------------------------------------  Recent Labs  06/14/15 0442  FERRITIN 440*    Coagulation profile No results for input(s): INR, PROTIME in the last 168 hours.  No results for input(s): DDIMER in the last 72 hours.  Cardiac Enzymes  Recent Labs Lab 06/10/15 0745  TROPONINI <0.03   ------------------------------------------------------------------------------------------------------------------ Invalid input(s): POCBNP    Assessment & Plan   This is a 79 year old Caucasian female admitted for fever  #  Sepsis - symptoms likely from autoimmune arthritis than infection Unknown etiology Appreciate ID input R knee fluid showed inflammation likely from arthritis. Waiting for cultures. Elevated ESR. With positive ANA and RF Autoimmune? PMR? Consulted and discussed with Dr. Jefm Bryant. Started on IV solumedrol. On IV abx MR cervical spine - Nothing acute. Benign meningioma.  # Thyroid disease: Continue the same regimen as taking off at home TSH is normal  # Depression: The patient is on multiple anti-depressants and mood stabilizers. Continue per home regimen.  # DVT prophylaxis: Heparin      Code Status Orders        Start     Ordered   06/10/15 1452  Full code   Continuous     06/10/15 1451    Advance Directive Documentation        Most Recent Value   Type of Advance Directive  Healthcare Power of Attorney, Living will   Pre-existing out of facility DNR order (yellow form or pink MOST form)     "MOST" Form in Place?       Consults  ID/Ortho/Rheumatology DVT Prophylaxis   Heparin  Lab Results  Component Value Date   PLT 508* 06/14/2015    Time Spent in minutes   35 minutes  Likely d/c in 1-2 days. PT eval requested.  HH vs SNF  Hillary Bow R M.D on 06/14/2015 at 12:15 PM  Between 7am to 6pm - Pager - [phone]  After 6pm go to [email or web address] - [facility]   Office  [phone]

## 2015-06-15 ENCOUNTER — Inpatient Hospital Stay: Payer: Medicare Other

## 2015-06-15 LAB — EPSTEIN-BARR VIRUS VCA ANTIBODY PANEL
EBV Early Antigen Ab, IgG: 61.8 U/mL — ABNORMAL HIGH (ref 0.0–8.9)
EBV NA IgG: 515 U/mL — ABNORMAL HIGH (ref 0.0–17.9)
EBV VCA IgG: 473 U/mL — ABNORMAL HIGH (ref 0.0–17.9)
EBV VCA IgM: <36 U/mL (ref 0.0–35.9)

## 2015-06-15 LAB — HEPATITIS B SURFACE ANTIBODY, QUANTITATIVE: Hep B S AB Quant (Post): <3.1 m[IU]/mL — ABNORMAL LOW

## 2015-06-15 LAB — CMV IGM

## 2015-06-15 NOTE — Progress Notes (Signed)
[place] at [facility]                                                                                                                                                                                            Patient Demographics   Wendy Murray, is a 79 y.o. female, DOB - 1936/01/25, ZDG:[id number]  Admit date - 06/10/2015   Admitting Physician Harrie Foreman, MD  Outpatient Primary MD for the patient is [name], MD   LOS - 5  Subjective  Patient continues to complain of generalized aches and pains. Shoulders, neck, headache, back. Some improvement with steroids. Denies any cough, abdominal pain nausea vomiting or diarrhea denies any urinary symptoms. Right knee pain still present. Had Right knee tap 12/27. Afebrile now. Was able to walk in morning- then her pain got worse- slowly now after her prednisone tablet- pain is getting better.   Review of Systems:   CONSTITUTIONAL: No documented fever. Positive fatigue and weakness. No weight gain, no weight loss.  EYES: No blurry or double vision.  ENT: No tinnitus. No postnasal drip. No redness of the oropharynx.  RESPIRATORY: No cough, no wheeze, no hemoptysis. No dyspnea.  CARDIOVASCULAR: No chest pain. No orthopnea. No palpitations. No syncope.  GASTROINTESTINAL: No nausea, no vomiting or diarrhea. No abdominal pain. No melena or hematochezia.  GENITOURINARY: No dysuria or hematuria.  ENDOCRINE: No polyuria or nocturia. No heat or cold intolerance.  HEMATOLOGY: No anemia. No bruising. No bleeding.  INTEGUMENTARY: No rashes. No lesions.  MUSCULOSKELETAL: severe generalized arthritis. No swelling. No gout.  NEUROLOGIC: No numbness, tingling, or ataxia. No seizure-type activity.  PSYCHIATRIC: No anxiety. No insomnia. No ADD.    Vitals:   Filed Vitals:   06/15/15 0500 06/15/15 0504 06/15/15 0802 06/15/15 1454  BP:  154/83 155/68 138/68  Pulse:  92 83 82  Temp:  97.9 F (36.6  C) 98.1 F (36.7 C) 98.1 F (36.7 C)  TempSrc:  Oral Oral Oral  Resp:  '16 19 17  ' Height:      Weight: 75.932 kg (167 lb 6.4 oz)     SpO2:  94% 94% 94%    Wt Readings from Last 3 Encounters:  06/15/15 75.932 kg (167 lb 6.4 oz)  06/07/15 73.029 kg (161 lb)  06/06/15 75.07 kg (165 lb 8 oz)     Intake/Output Summary (Last 24 hours) at 06/15/15 1632 Last data filed at 06/15/15 0500  Gross per 24 hour  Intake    820 ml  Output      0 ml  Net    820 ml  Physical Exam:   GENERAL: Pleasant-appearing in no apparent distress.  HEAD, EYES, EARS, NOSE AND THROAT: Atraumatic, normocephalic. Extraocular muscles are intact. Pupils equal and reactive to light. Sclerae anicteric. No conjunctival injection. No oro-pharyngeal erythema.  NECK: Supple. There is no jugular venous distention. No bruits, no lymphadenopathy, no thyromegaly.  HEART: Regular rate and rhythm,. No murmurs, no rubs, no clicks.  LUNGS: Clear to auscultation bilaterally. No rales or rhonchi. No wheezes.  ABDOMEN: Soft, flat, nontender, nondistended. Has good bowel sounds. No hepatosplenomegaly appreciated.  EXTREMITIES: No evidence of any cyanosis, clubbing, or peripheral edema.  +2 pedal and radial pulses bilaterally.  NEUROLOGIC: The patient is alert, awake, and oriented x3 with no focal motor or sensory deficits appreciated bilaterally.  SKIN: Moist and warm with no rashes appreciated.  Psych: Not anxious, depressed LN: No inguinal LN enlargement Right knee warm    Antibiotics   Anti-infectives    Start     Dose/Rate Route Frequency Ordered Stop   06/12/15 1430  cefTRIAXone (ROCEPHIN) 2 g in dextrose 5 % 50 mL IVPB  Status:  Discontinued     2 g 100 mL/hr over 30 Minutes Intravenous Every 12 hours 06/12/15 1415 06/15/15 1619   06/11/15 1900  cefTRIAXone (ROCEPHIN) 1 g in dextrose 5 % 50 mL IVPB     1 g 100 mL/hr over 30 Minutes Intravenous  Once 06/11/15 1855 06/11/15 2123   06/11/15 1900  vancomycin  (VANCOCIN) IVPB 1000 mg/200 mL premix     1,000 mg 200 mL/hr over 60 Minutes Intravenous  Once 06/11/15 1855 06/11/15 2042      Medications   Scheduled Meds: . acidophilus  1 capsule Oral Daily  . cholecalciferol  1,000 Units Oral Daily  . clonazePAM  1 mg Oral QHS  . docusate sodium  100 mg Oral BID  . heparin  5,000 Units Subcutaneous 3 times per day  . levothyroxine  75 mcg Oral QAC breakfast  . liothyronine  10 mcg Oral Daily  . methylPREDNISolone (SOLU-MEDROL) injection  80 mg Intravenous Daily  . mirtazapine  15 mg Oral QHS  . multivitamin with minerals  1 tablet Oral Daily  . pentafluoroprop-tetrafluoroeth   Topical Once  . risperiDONE  1 mg Oral QHS  . sodium chloride  3 mL Intravenous Q12H  . venlafaxine XR  150 mg Oral Daily  . venlafaxine XR  75 mg Oral Daily   Continuous Infusions:   PRN Meds:.acetaminophen **OR** acetaminophen, ibuprofen, ketorolac, ondansetron **OR** ondansetron (ZOFRAN) IV, oxyCODONE   Data Review:   Micro Results Recent Results (from the past 240 hour(s))  Blood culture (routine x 2)     Status: None (Preliminary result)   Collection Time: 06/10/15  7:45 AM  Result Value Ref Range Status   Specimen Description BLOOD LEFT AC  Final   Special Requests BOTTLES DRAWN AEROBIC AND ANAEROBIC 2ML  Final   Culture NO GROWTH 4 DAYS  Final   Report Status PENDING  Incomplete  Blood culture (routine x 2)     Status: None (Preliminary result)   Collection Time: 06/10/15  7:45 AM  Result Value Ref Range Status   Specimen Description BLOOD LEFT WRIST  Final   Special Requests   Final    BOTTLES DRAWN AEROBIC AND ANAEROBIC ANAEROBIC4ML AEROBIC 3ML   Culture NO GROWTH 4 DAYS  Final   Report Status PENDING  Incomplete  Urine culture     Status: None   Collection Time: 06/10/15 10:10 AM  Result Value  Ref Range Status   Specimen Description URINE, RANDOM  Final   Special Requests NONE  Final   Culture NO GROWTH 1 DAY  Final   Report Status  06/11/2015 FINAL  Final  Body fluid culture     Status: None (Preliminary result)   Collection Time: 06/12/15  8:25 PM  Result Value Ref Range Status   Specimen Description SYNOVIAL  Final   Special Requests Normal  Final   Gram Stain MODERATE WBC SEEN NO ORGANISMS SEEN   Final   Culture NO GROWTH 2 DAYS  Final   Report Status PENDING  Incomplete    Radiology Reports Mr Cervical Spine W Wo Contrast  06/12/2015  CLINICAL DATA:  Neck pain weakness.  Headache.  Leukocytosis. EXAM: MRI CERVICAL SPINE WITHOUT AND WITH CONTRAST TECHNIQUE: Multiplanar and multiecho pulse sequences of the cervical spine, to include the craniocervical junction and cervicothoracic junction, were obtained according to standard protocol without and with intravenous contrast. CONTRAST:  95m MULTIHANCE GADOBENATE DIMEGLUMINE 529 MG/ML IV SOLN COMPARISON:  None. FINDINGS: Normal alignment of the cervical vertebral bodies. They demonstrate normal marrow signal except for patchy endplate reactive changes and a few small scattered hemangiomas. Patchy marrow signal in the clivus is probably due to the patient's age and osteoporosis. The cervical spinal cord demonstrates normal signal intensity. No cord lesions or syrinx. There is a small enhancing extra-axial lesion at the skullbase posteriorly which is most consistent with a meningioma. It measures approximately 9 mm. No mass effect on the upper cervical cord. C2-3:  No significant findings. C3-4: Mild annular bulge with slight impression on the ventral thecal sac. No significant spinal or foraminal stenosis. Mild left foraminal encroachment. C4-5: Mild bulging annulus and mild facet disease but no significant spinal or foraminal stenosis. C5-6: Bulging annulus, osteophytic ridging and uncinate spurring with mild flattening of the ventral thecal sac and narrowing of the ventral CSF space. Small focal medial foraminal disc protrusion could affect the left C6 nerve root. C6-7: Shallow  broad-based disc protrusion with flattening of the ventral thecal sac and narrowing of the ventral CSF space. No significant foraminal stenosis. C7-T1:  No significant findings. IMPRESSION: 1. Mild degenerative cervical spondylosis with multilevel disc disease and facet disease. No findings for diskitis, osteomyelitis or abscess. 2. 9 mm enhancing extra-axial lesion at the foramen magnum posteriorly most consistent with a benign meningioma. No mass effect on the upper cervical cord. 3. Mild spinal and left foraminal stenosis at C5-6. 4. Shallow broad-based disc protrusion at C6-7. Electronically Signed   By: PMarijo SanesM.D.   On: 06/12/2015 17:02   UKoreaAbdomen Complete  06/15/2015  CLINICAL DATA:  Abnormal LFTs, fever. EXAM: ABDOMEN ULTRASOUND COMPLETE COMPARISON:  None. FINDINGS: Gallbladder: Prior cholecystectomy Common bile duct: Diameter: Dilated to 9 mm, likely related to patient's age and post cholecystectomy state. Liver: No focal lesion identified. Within normal limits in parenchymal echogenicity. IVC: No abnormality visualized. Pancreas: Visualized portion unremarkable. Spleen: Size and appearance within normal limits. Right Kidney: Length: 9.6 cm. Echogenicity within normal limits. No mass or hydronephrosis visualized. Left Kidney: Length: 12 cm. Parapelvic cysts noted, the largest 3.3 cm. Echogenicity within normal limits. No mass or hydronephrosis visualized. Abdominal aorta: No aneurysm visualized. Other findings: Right pleural effusion noted. IMPRESSION: No acute findings.  Prior cholecystectomy. Right pleural effusion. Electronically Signed   By: KRolm BaptiseM.D.   On: 06/15/2015 10:15   Dg Chest Portable 1 View  06/10/2015  CLINICAL DATA:  Generalized body pain in  weakness. Patient had diagnosis of are viral infection last Wednesday EXAM: PORTABLE CHEST 1 VIEW COMPARISON:  None. FINDINGS: The heart size and mediastinal contours are within normal limits. Both lungs are clear. The visualized  skeletal structures are unremarkable. IMPRESSION: No active cardiopulmonary disease. Electronically Signed   By: Abelardo Diesel M.D.   On: 06/10/2015 08:00   Dg Knee Right Port  06/12/2015  CLINICAL DATA:  Right knee pain. EXAM: PORTABLE RIGHT KNEE - 1-2 VIEW COMPARISON:  None. FINDINGS: No fracture or dislocation is noted. Moderate suprapatellar joint effusion is noted. Chondrocalcinosis is noted in the medial lateral joint spaces. No significant joint space narrowing is noted. IMPRESSION: Extensive chondrocalcinosis is noted median laterally suggesting degenerative joint disease or calcium pyrophosphate disease. Moderate suprapatellar joint effusion is noted. No fracture or dislocation is noted. Electronically Signed   By: Marijo Conception, M.D.   On: 06/12/2015 19:36     CBC  Recent Labs Lab 06/10/15 0745 06/11/15 0904 06/13/15 0956 06/14/15 0442  WBC 14.5* 10.3 11.7* 15.7*  HGB 11.5* 10.2* 9.2* 9.7*  HCT 35.0 30.9* 28.2* 28.9*  PLT 453* 439 456* 508*  MCV 82.6 82.7 81.6 83.2  MCH 27.0 27.4 26.7 27.8  MCHC 32.8 33.1 32.7 33.4  RDW 14.1 14.4 13.9 14.1  LYMPHSABS 0.7*  --   --  0.7*  MONOABS 2.0*  --   --  0.8  EOSABS 0.0  --   --  0.0  BASOSABS 0.0  --   --  0.0    Chemistries   Recent Labs Lab 06/10/15 0745 06/14/15 0442  NA 133* 139  K 3.8 3.6  CL 100* 105  CO2 25 27  GLUCOSE 139* 169*  BUN 12 12  CREATININE 0.80 0.76  CALCIUM 10.0 9.7  AST 83* 25  ALT 103* 57*  ALKPHOS 203* 290*  BILITOT 0.9 0.3   ------------------------------------------------------------------------------------------------------------------ estimated creatinine clearance is 58.8 mL/min (by C-G formula based on Cr of 0.76). ------------------------------------------------------------------------------------------------------------------ No results for input(s): HGBA1C in the last 72  hours. ------------------------------------------------------------------------------------------------------------------ No results for input(s): CHOL, HDL, LDLCALC, TRIG, CHOLHDL, LDLDIRECT in the last 72 hours. ------------------------------------------------------------------------------------------------------------------ No results for input(s): TSH, T4TOTAL, T3FREE, THYROIDAB in the last 72 hours.  Invalid input(s): FREET3 ------------------------------------------------------------------------------------------------------------------  Recent Labs  06/14/15 0442  FERRITIN 440*    Coagulation profile No results for input(s): INR, PROTIME in the last 168 hours.  No results for input(s): DDIMER in the last 72 hours.  Cardiac Enzymes  Recent Labs Lab 06/10/15 0745  TROPONINI <0.03   ------------------------------------------------------------------------------------------------------------------ Invalid input(s): POCBNP    Assessment & Plan   This is a 79 year old Caucasian female admitted for fever  # Sepsis -suspected but ruled out. Appreciate ID input R knee fluid showed inflammation likely from arthritis. inflammatory arthritis Elevated ESR. With positive ANA and RF Autoimmune? PMR? Consulted and discussed with Dr. Jefm Bryant. Started on IV solumedrol. On IV abx- stopped now. MR cervical spine - Nothing acute. Benign meningioma.  switched to oral prednisone- taper over 2 weeks.  # Thyroid disease: Continue the same regimen as taking off at home TSH is normal  # Depression: The patient is on multiple anti-depressants and mood stabilizers. Continue per home regimen.  # DVT prophylaxis: Heparin      Code Status Orders        Start     Ordered   06/10/15 1452  Full code   Continuous     06/10/15 1451    Advance Directive Documentation  Most Recent Value   Type of Advance Directive  Healthcare Power of Attorney, Living will   Pre-existing out of  facility DNR order (yellow form or pink MOST form)     "MOST" Form in Place?       Consults  ID/Ortho/Rheumatology DVT Prophylaxis   Heparin  Lab Results  Component Value Date   PLT 508* 06/14/2015    Time Spent in minutes   35 minutes  Likely d/c in 1 days. PT eval appreciated.  HH and PT.  Vaughan Basta M.D on 06/15/2015 at 4:32 PM  Between 7am to 6pm - Pager - [phone]  After 6pm go to [email or web address] - password [email or web address] [facility]   Office  [phone]

## 2015-06-15 NOTE — Progress Notes (Signed)
Initial Nutrition Assessment   INTERVENTION:   Meals and Snacks: Cater to patient preferences Medical Food Supplement Therapy: will recommend supplement on follow up if po intake poor   NUTRITION DIAGNOSIS:   No nutrition diagnosis at this time  GOAL:   Patient will meet greater than or equal to 90% of their needs   MONITOR:    (Energy Intake, Anthropometrics, Electrolyte and Renal Profile)  REASON FOR ASSESSMENT:   LOS    ASSESSMENT:   Pt admitted with viral sepsis s/p right knee tap secondary to arthritis. Pt unavailable this afternoon on rounds.  Past Medical History  Diagnosis Date  . Hyperlipidemia   . Recurrent major depression ([place])   . Hypothyroidism   . DVT (deep venous thrombosis) ([place]) 2012    after TKR  . Osteoarthritis, knee   . IBS (irritable bowel syndrome)   . Diverticulosis     Diet Order:  Diet Heart Room service appropriate?: Yes; Fluid consistency:: Thin    Current Nutrition: Per I/O chart pt eating 64% of meals on average since admission.  Food/Nutrition-Related History: Per MST no decrease in appetite PTA   Scheduled Medications:  . acidophilus  1 capsule Oral Daily  . cefTRIAXone (ROCEPHIN)  IV  2 g Intravenous Q12H  . cholecalciferol  1,000 Units Oral Daily  . clonazePAM  1 mg Oral QHS  . docusate sodium  100 mg Oral BID  . heparin  5,000 Units Subcutaneous 3 times per day  . levothyroxine  75 mcg Oral QAC breakfast  . liothyronine  10 mcg Oral Daily  . methylPREDNISolone (SOLU-MEDROL) injection  80 mg Intravenous Daily  . mirtazapine  15 mg Oral QHS  . multivitamin with minerals  1 tablet Oral Daily  . pentafluoroprop-tetrafluoroeth   Topical Once  . risperiDONE  1 mg Oral QHS  . sodium chloride  3 mL Intravenous Q12H  . venlafaxine XR  150 mg Oral Daily  . venlafaxine XR  75 mg Oral Daily      Electrolyte/Renal Profile and Glucose Profile:   Recent Labs Lab 06/10/15 0745 06/14/15 0442  NA 133* 139  K 3.8 3.6  CL  100* 105  CO2 25 27  BUN 12 12  CREATININE 0.80 0.76  CALCIUM 10.0 9.7  GLUCOSE 139* 169*   Protein Profile:  Recent Labs Lab 06/10/15 0745 06/14/15 0442  ALBUMIN 3.3* 2.2*    Gastrointestinal Profile: Last BM: 06/15/2015    Weight Change: stable weight per CHL encounters    Height:   Ht Readings from Last 1 Encounters:  06/10/15 5\' 4"  (1.626 m)    Weight:   Wt Readings from Last 1 Encounters:  06/15/15 167 lb 6.4 oz (75.932 kg)    Wt Readings from Last 10 Encounters:  06/15/15 167 lb 6.4 oz (75.932 kg)  06/07/15 161 lb (73.029 kg)  06/06/15 165 lb 8 oz (75.07 kg)  01/30/15 161 lb (73.029 kg)  12/25/14 165 lb (74.844 kg)  12/13/14 166 lb (75.297 kg)  12/12/13 160 lb (72.576 kg)  05/23/13 158 lb 8 oz (71.895 kg)    BMI:  Body mass index is 28.72 kg/(m^2).   EDUCATION NEEDS:   No education needs identified at this time   [place], RD, LDN Pager [phone] Weekend/On-Call Pager [phone]

## 2015-06-15 NOTE — Progress Notes (Signed)
[place] INFECTIOUS DISEASE PROGRESS NOTE Date of Admission:  06/10/2015     ID: Wendy Murray is a 79 y.o. female with fevers, myalgias,   Active Problems:   Viral sepsis   Subjective: sxs improving with steroids no fevers Got up and walked  cxs negative  ROS  Eleven systems are reviewed and negative except per hpi  Medications:  Antibiotics Given (last 72 hours)    Date/Time Action Medication Dose Rate   06/12/15 2108 Given   cefTRIAXone (ROCEPHIN) 2 g in dextrose 5 % 50 mL IVPB 2 g 100 mL/hr   06/13/15 5597 Given   cefTRIAXone (ROCEPHIN) 2 g in dextrose 5 % 50 mL IVPB 2 g 100 mL/hr   06/13/15 2200 Given   cefTRIAXone (ROCEPHIN) 2 g in dextrose 5 % 50 mL IVPB 2 g 100 mL/hr   06/14/15 1000 Given   cefTRIAXone (ROCEPHIN) 2 g in dextrose 5 % 50 mL IVPB 2 g 100 mL/hr   06/14/15 2111 Given   cefTRIAXone (ROCEPHIN) 2 g in dextrose 5 % 50 mL IVPB 2 g 100 mL/hr   06/15/15 0900 Given   cefTRIAXone (ROCEPHIN) 2 g in dextrose 5 % 50 mL IVPB 2 g 100 mL/hr     . acidophilus  1 capsule Oral Daily  . cefTRIAXone (ROCEPHIN)  IV  2 g Intravenous Q12H  . cholecalciferol  1,000 Units Oral Daily  . clonazePAM  1 mg Oral QHS  . docusate sodium  100 mg Oral BID  . heparin  5,000 Units Subcutaneous 3 times per day  . levothyroxine  75 mcg Oral QAC breakfast  . liothyronine  10 mcg Oral Daily  . methylPREDNISolone (SOLU-MEDROL) injection  80 mg Intravenous Daily  . mirtazapine  15 mg Oral QHS  . multivitamin with minerals  1 tablet Oral Daily  . pentafluoroprop-tetrafluoroeth   Topical Once  . risperiDONE  1 mg Oral QHS  . sodium chloride  3 mL Intravenous Q12H  . venlafaxine XR  150 mg Oral Daily  . venlafaxine XR  75 mg Oral Daily    Objective: Vital signs in last 24 hours: Temp:  [97.9 F (36.6 C)-98.9 F (37.2 C)] 98.1 F (36.7 C) (12/30 1454) Pulse Rate:  [82-94] 82 (12/30 1454) Resp:  [16-20] 17 (12/30 1454) BP: (126-155)/(57-83) 138/68 mmHg (12/30 1454) SpO2:   [93 %-94 %] 94 % (12/30 1454) Weight:  [75.932 kg (167 lb 6.4 oz)] 75.932 kg (167 lb 6.4 oz) (12/30 0500) Constitutional: oriented to person, place, and time. appears acutely ill, is warm to touch, in pain  HENT: [place]/AT, PERRLA, no scleral icterus Mouth/Throat: Oropharynx is clear and moist. No oropharyngeal exudate. No dental lesions Cardiovascular: RRR Pulmonary/Chest: Effort normal and breath sounds normal. No respiratory distress. has no wheezes.  Neck - now no pain with neck flexion , is able to move freely with passive motion Abdominal: Soft. Bowel sounds are normal. exhibits no distension. There is no tenderness.  Lymphadenopathy: no cervical adenopathy. No axillary adenopathy Neurological: alert and oriented to person, place, and time.  Skin: Skin is hot and dry. No rash noted. No erythema. NO peripheral stigmata of endocarditis Psychiatric: a normal mood and affect. behavior is normal.  EXT no edema. R knee is still swollen, improved romL knee TKR scar wnl  Lab Results  Recent Labs  06/13/15 0956 06/14/15 0442  WBC 11.7* 15.7*  HGB 9.2* 9.7*  HCT 28.2* 28.9*  NA  --  139  K  --  3.6  CL  --  105  CO2  --  27  BUN  --  12  CREATININE  --  0.76    Microbiology: Results for orders placed or performed during the hospital encounter of 06/10/15  Blood culture (routine x 2)     Status: None (Preliminary result)   Collection Time: 06/10/15  7:45 AM  Result Value Ref Range Status   Specimen Description BLOOD LEFT AC  Final   Special Requests BOTTLES DRAWN AEROBIC AND ANAEROBIC 2ML  Final   Culture NO GROWTH 4 DAYS  Final   Report Status PENDING  Incomplete  Blood culture (routine x 2)     Status: None (Preliminary result)   Collection Time: 06/10/15  7:45 AM  Result Value Ref Range Status   Specimen Description BLOOD LEFT WRIST  Final   Special Requests   Final    BOTTLES DRAWN AEROBIC AND ANAEROBIC ANAEROBIC4ML AEROBIC 3ML   Culture NO GROWTH 4 DAYS  Final    Report Status PENDING  Incomplete  Urine culture     Status: None   Collection Time: 06/10/15 10:10 AM  Result Value Ref Range Status   Specimen Description URINE, RANDOM  Final   Special Requests NONE  Final   Culture NO GROWTH 1 DAY  Final   Report Status 06/11/2015 FINAL  Final  Body fluid culture     Status: None (Preliminary result)   Collection Time: 06/12/15  8:25 PM  Result Value Ref Range Status   Specimen Description SYNOVIAL  Final   Special Requests Normal  Final   Gram Stain MODERATE WBC SEEN NO ORGANISMS SEEN   Final   Culture NO GROWTH 2 DAYS  Final   Report Status PENDING  Incomplete    Studies/Results: US Abdomen Complete  06/15/2015  CLINICAL DATA:  Abnormal LFTs, fever. EXAM: ABDOMEN ULTRASOUND COMPLETE COMPARISON:  None. FINDINGS: Gallbladder: Prior cholecystectomy Common bile duct: Diameter: Dilated to 9 mm, likely related to patient's age and post cholecystectomy state. Liver: No focal lesion identified. Within normal limits in parenchymal echogenicity. IVC: No abnormality visualized. Pancreas: Visualized portion unremarkable. Spleen: Size and appearance within normal limits. Right Kidney: Length: 9.6 cm. Echogenicity within normal limits. No mass or hydronephrosis visualized. Left Kidney: Length: 12 cm. Parapelvic cysts noted, the largest 3.3 cm. Echogenicity within normal limits. No mass or hydronephrosis visualized. Abdominal aorta: No aneurysm visualized. Other findings: Right pleural effusion noted. IMPRESSION: No acute findings.  Prior cholecystectomy. Right pleural effusion. Electronically Signed   By: Rolm Baptise M.D.   On: 06/15/2015 10:15    Assessment/Plan: Wendy Murray is a 79 y.o. female with one week myalgias, fevers, neck pain and R knee pain. WU so far negative.  Flu neg, UA neg, LFTs mildly elevated. ESR 105, CPK nml, CXR negative. Monospot negative.  RF +,   WBC with neutrophilia but improving. Has thrombocytosis.  [place] 12/25 ngtd, ucx  neg. Joint cx NGTD MRI C spine neg for discitis or osteomyelitis Had tap R knee by ortho with 18 K WBC and has CPPD crystals - cx pending but this is more consistent with inflammatory as opposed to infectious arthritis.  Responding to steroids   RECS Improving with steroids Stop abx and monitor Thank you very much for the consult. Will follow with you.  [place], [place]   06/15/2015, 4:13 PM

## 2015-06-15 NOTE — Care Management Important Message (Signed)
Important Message  Patient Details  Name: BAILEYANN DENGLER MRN: [id number] Date of Birth: Feb 10, 1936   Medicare Important Message Given:  Yes    Ayda Tancredi A, RN 06/15/2015, 9:42 AM

## 2015-06-16 ENCOUNTER — Other Ambulatory Visit: Payer: Self-pay | Admitting: Internal Medicine

## 2015-06-16 LAB — CBC
HEMATOCRIT: 29.4 % — AB (ref 35.0–47.0)
Hemoglobin: 9.8 g/dL — ABNORMAL LOW (ref 12.0–16.0)
MCH: 27.3 pg (ref 26.0–34.0)
MCHC: 33.5 g/dL (ref 32.0–36.0)
MCV: 81.5 fL (ref 80.0–100.0)
Platelets: 662 10*3/uL — ABNORMAL HIGH (ref 150–440)
RBC: 3.6 MIL/uL — AB (ref 3.80–5.20)
RDW: 14.3 % (ref 11.5–14.5)
WBC: 10.6 10*3/uL (ref 3.6–11.0)

## 2015-06-16 LAB — CULTURE, BLOOD (ROUTINE X 2)
Culture: NO GROWTH
Culture: NO GROWTH

## 2015-06-16 LAB — HEPATITIS C ANTIBODY: HCV Ab: <0.1 {s_co_ratio} (ref 0.0–0.9)

## 2015-06-16 MED ORDER — PREDNISONE 10 MG (21) PO TBPK: ORAL_TABLET | ORAL | Status: DC

## 2015-06-16 MED ORDER — OXYCODONE HCL 5 MG PO TABS: 5.0000 mg | ORAL_TABLET | Freq: Four times a day (QID) | ORAL | Status: DC | PRN

## 2015-06-16 MED ORDER — IBUPROFEN 600 MG PO TABS: 600.0000 mg | ORAL_TABLET | Freq: Four times a day (QID) | ORAL | Status: DC | PRN

## 2015-06-16 NOTE — Care Management Note (Signed)
Case Management Note  Patient Details  Name: Wendy Murray MRN: [id number] Date of Birth: Oct 08, 1935  Subjective/Objective:     Wendy Murray was provided with an MD order for home health PT to give to the PT department at [facility].               Action/Plan:   Expected Discharge Date:                  Expected Discharge Plan:     In-House Referral:     Discharge planning Services  CM Consult  Post Acute Care Choice:    Choice offered to:  Patient  DME Arranged:    DME Agency:     HH Arranged:    [place] Agency:     Status of Service:  Completed, signed off  Medicare Important Message Given:  Yes Date Medicare IM Given:    Medicare IM give by:    Date Additional Medicare IM Given:    Additional Medicare Important Message give by:     If discussed at [place] of Stay Meetings, dates discussed:    Additional Comments:  Wendy Nauta A, RN 06/16/2015, 11:52 AM

## 2015-06-16 NOTE — Discharge Summary (Signed)
[place] [facility] [place] NAME: Wendy Murray    MR#:  [id number]  DATE OF BIRTH:  December 15, 1935  DATE OF ADMISSION:  06/10/2015 ADMITTING PHYSICIAN: Wendy Foreman, MD  DATE OF DISCHARGE:06/16/2015  PRIMARY CARE PHYSICIAN: Wendy Simpler, MD    ADMISSION DIAGNOSIS:  Bronchitis [J40] SIRS (systemic inflammatory response syndrome) (HCC) [R65.10]  DISCHARGE DIAGNOSIS:  Active Problems:   Viral sepsis   Suspected sepsis- but ruled out as it is inflammatory arthritis.  SECONDARY DIAGNOSIS:   Past Medical History  Diagnosis Date  . Hyperlipidemia   . Recurrent major depression (Wendy Murray)   . Hypothyroidism   . DVT (deep venous thrombosis) (Wendy Murray) 2012    after TKR  . Osteoarthritis, knee   . IBS (irritable bowel syndrome)   . Diverticulosis     HOSPITAL COURSE:   # Sepsis -suspected but ruled out. Appreciate ID input R knee fluid showed inflammation likely from arthritis. inflammatory arthritis Elevated ESR. With positive ANA and RF Autoimmune? PMR? Consulted and discussed with Dr. Jefm Murray. Started on IV solumedrol. On IV abx- stopped now. MR cervical spine - Nothing acute. Benign meningioma. switched to oral prednisone- taper over 2 weeks.  Follow with Rheumatology clinic.  # Thyroid disease: Continue the same regimen as taking off at home TSH is normal  # Depression: The patient is on multiple anti-depressants and mood stabilizers. Continue per home regimen.  # DVT prophylaxis: Heparin  DISCHARGE CONDITIONS:   Stable.  CONSULTS OBTAINED:  Treatment Team:  Wendy Prows, MD Wendy Leep, MD Wendy Murray., MD  DRUG ALLERGIES:  No Known Allergies  DISCHARGE MEDICATIONS:   Current Discharge Medication List    START taking these medications   Details  ibuprofen (ADVIL,MOTRIN) 600 MG tablet Take 1 tablet (600 mg total) by mouth every 6 (six) hours as needed for moderate pain. Qty: 30 tablet,  Refills: 0    oxyCODONE (OXY IR/ROXICODONE) 5 MG immediate release tablet Take 1 tablet (5 mg total) by mouth every 6 (six) hours as needed for moderate pain. Qty: 20 tablet, Refills: 0    predniSONE (STERAPRED UNI-PAK 21 TAB) 10 MG (21) TBPK tablet Start 60 mg oral on first day, taper 5 mg daily- until complete over next 12 days. Qty: 45 tablet, Refills: 0      CONTINUE these medications which have NOT CHANGED   Details  acetaminophen (TYLENOL) 500 MG tablet Take 500 mg by mouth every 6 (six) hours as needed for mild pain or headache.     acidophilus (RISAQUAD) CAPS capsule Take 1 capsule by mouth daily.    cholecalciferol (VITAMIN D) 1000 UNITS tablet Take 1,000 Units by mouth daily.    clonazePAM (KLONOPIN) 1 MG tablet Take 1 mg by mouth at bedtime.     levothyroxine (LEVOTHROID) 75 MCG tablet Take 1 tablet (75 mcg total) by mouth daily before breakfast. Qty: 90 tablet, Refills: 3    liothyronine (CYTOMEL) 5 MCG tablet Take 10 mcg by mouth daily.    mirtazapine (REMERON) 15 MG tablet Take 15 mg by mouth at bedtime.     Multiple Vitamin (MULTIVITAMIN WITH MINERALS) TABS tablet Take 1 tablet by mouth daily.    risperiDONE (RISPERDAL) 1 MG tablet Take 1 mg by mouth at bedtime.     !! venlafaxine XR (EFFEXOR-XR) 150 MG 24 hr capsule Take 150 mg by mouth daily. Pt takes with a 13m capsule.    !! venlafaxine XR (EFFEXOR-XR) 75 MG 24  hr capsule Take 75 mg by mouth daily. Pt takes with a 133m capsule.    baclofen (LIORESAL) 10 MG tablet Take 1 tablet (10 mg total) by mouth 3 (three) times daily as needed for muscle spasms. Qty: 30 each, Refills: 0     !! - Potential duplicate medications found. Please discuss with provider.       DISCHARGE INSTRUCTIONS:    Follow with Dr. KJefm Bryantin office in 2 weeks.  If you experience worsening of your admission symptoms, develop shortness of breath, life threatening emergency, suicidal or homicidal thoughts you must seek medical  attention immediately by calling 911 or calling your MD immediately  if symptoms less severe.  You Must read complete instructions/literature along with all the possible adverse reactions/side effects for all the Medicines you take and that have been prescribed to you. Take any new Medicines after you have completely understood and accept all the possible adverse reactions/side effects.   Please note  You were cared for by a hospitalist during your hospital stay. If you have any questions about your discharge medications or the care you received while you were in the hospital after you are discharged, you can call the unit and asked to speak with the hospitalist on call if the hospitalist that took care of you is not available. Once you are discharged, your primary care physician will handle any further medical issues. Please note that NO REFILLS for any discharge medications will be authorized once you are discharged, as it is imperative that you return to your primary care physician (or establish a relationship with a primary care physician if you do not have one) for your aftercare needs so that they can reassess your need for medications and monitor your lab values.    Today   CHIEF COMPLAINT:   Chief Complaint  Patient presents with  . Muscle Pain    generalized  . Weakness    HISTORY OF PRESENT ILLNESS:  EArva Murray is a 79y.o. female presents emergency department complaining of general malaise. She has been feeling unwell for at least a week with subjective fevers (MAXIMUM TEMPERATURE 100.38F). She was seen in her primary care doctor's office earlier this week for blood work. She reportedly had leukocytosis but still did not have signs or symptoms of bacterial infection. She presents emergency department today with worsening body aches. He denies nausea, vomiting or diarrhea. She has been eating well but admits to having significantly decreased energy. In the emergency  department. Screening was negative but leukocytosis was present. Despite fluid resuscitation the patient continued to be tachycardic which prompted the emergency department physician to call for admission   VITAL SIGNS:  Blood pressure 142/64, pulse 87, temperature 98.2 F (36.8 C), temperature source Oral, resp. rate 16, height 5' 4" (1.626 m), weight 75.68 kg (166 lb 13.5 oz), SpO2 94 %.  I/O:   Intake/Output Summary (Last 24 hours) at 06/16/15 1101 Last data filed at 06/16/15 0900  Gross per 24 hour  Intake   1560 ml  Output      0 ml  Net   1560 ml    PHYSICAL EXAMINATION:   GENERAL: Pleasant-appearing in no apparent distress.  HEAD, EYES, EARS, NOSE AND THROAT: Atraumatic, normocephalic. Extraocular muscles are intact. Pupils equal and reactive to light. Sclerae anicteric. No conjunctival injection. No oro-pharyngeal erythema.  NECK: Supple. There is no jugular venous distention. No bruits, no lymphadenopathy, no thyromegaly.  HEART: Regular rate and rhythm,. No murmurs, no  rubs, no clicks.  LUNGS: Clear to auscultation bilaterally. No rales or rhonchi. No wheezes.  ABDOMEN: Soft, flat, nontender, nondistended. Has good bowel sounds. No hepatosplenomegaly appreciated.  EXTREMITIES: No evidence of any cyanosis, clubbing, or peripheral edema. +2 pedal and radial pulses bilaterally.  NEUROLOGIC: The patient is alert, awake, and oriented x3 with no focal motor or sensory deficits appreciated bilaterally.  SKIN: Moist and warm with no rashes appreciated.  Psych: Not anxious, depressed LN: No inguinal LN enlargement  DATA REVIEW:   CBC  Recent Labs Lab 06/16/15 0432  WBC 10.6  HGB 9.8*  HCT 29.4*  PLT 662*    Chemistries   Recent Labs Lab 06/14/15 0442  NA 139  K 3.6  CL 105  CO2 27  GLUCOSE 169*  BUN 12  CREATININE 0.76  CALCIUM 9.7  AST 25  ALT 57*  ALKPHOS 290*  BILITOT 0.3    Cardiac Enzymes  Recent Labs Lab 06/10/15 0745  TROPONINI  <0.03    Microbiology Results  Results for orders placed or performed during the hospital encounter of 06/10/15  Blood culture (routine x 2)     Status: None (Preliminary result)   Collection Time: 06/10/15  7:45 AM  Result Value Ref Range Status   Specimen Description BLOOD LEFT AC  Final   Special Requests BOTTLES DRAWN AEROBIC AND ANAEROBIC 2ML  Final   Culture NO GROWTH 4 DAYS  Final   Report Status PENDING  Incomplete  Blood culture (routine x 2)     Status: None (Preliminary result)   Collection Time: 06/10/15  7:45 AM  Result Value Ref Range Status   Specimen Description BLOOD LEFT WRIST  Final   Special Requests   Final    BOTTLES DRAWN AEROBIC AND ANAEROBIC ANAEROBIC4ML AEROBIC 3ML   Culture NO GROWTH 4 DAYS  Final   Report Status PENDING  Incomplete  Urine culture     Status: None   Collection Time: 06/10/15 10:10 AM  Result Value Ref Range Status   Specimen Description URINE, RANDOM  Final   Special Requests NONE  Final   Culture NO GROWTH 1 DAY  Final   Report Status 06/11/2015 FINAL  Final  Body fluid culture     Status: None (Preliminary result)   Collection Time: 06/12/15  8:25 PM  Result Value Ref Range Status   Specimen Description SYNOVIAL  Final   Special Requests Normal  Final   Gram Stain MODERATE WBC SEEN NO ORGANISMS SEEN   Final   Culture NO GROWTH 3 DAYS  Final   Report Status PENDING  Incomplete    RADIOLOGY:  US Abdomen Complete  06/15/2015  CLINICAL DATA:  Abnormal LFTs, fever. EXAM: ABDOMEN ULTRASOUND COMPLETE COMPARISON:  None. FINDINGS: Gallbladder: Prior cholecystectomy Common bile duct: Diameter: Dilated to 9 mm, likely related to patient's age and post cholecystectomy state. Liver: No focal lesion identified. Within normal limits in parenchymal echogenicity. IVC: No abnormality visualized. Pancreas: Visualized portion unremarkable. Spleen: Size and appearance within normal limits. Right Kidney: Length: 9.6 cm. Echogenicity within normal  limits. No mass or hydronephrosis visualized. Left Kidney: Length: 12 cm. Parapelvic cysts noted, the largest 3.3 cm. Echogenicity within normal limits. No mass or hydronephrosis visualized. Abdominal aorta: No aneurysm visualized. Other findings: Right pleural effusion noted. IMPRESSION: No acute findings.  Prior cholecystectomy. Right pleural effusion. Electronically Signed   By: Rolm Baptise M.D.   On: 06/15/2015 10:15     Management plans discussed with the patient, family and  they are in agreement.  CODE STATUS:     Code Status Orders        Start     Ordered   06/10/15 1452  Full code   Continuous     06/10/15 1451    Advance Directive Documentation        Most Recent Value   Type of Advance Directive  Healthcare Power of Attorney, Living will   Pre-existing out of facility DNR order (yellow form or pink MOST form)     "MOST" Form in Place?        TOTAL TIME TAKING CARE OF THIS PATIENT: 35 minutes.    Vaughan Basta M.D on 06/16/2015 at 11:01 AM  Between 7am to 6pm - Pager - [phone]  After 6pm go to [email or web address] - password [email or web address] [facility]  Office  [phone]  CC: Primary care physician; Wendy Simpler, MD   Note: This dictation was prepared with Dragon dictation along with smaller phrase technology. Any transcriptional errors that result from this process are unintentional.

## 2015-06-17 LAB — BODY FLUID CULTURE
Culture: NO GROWTH
Special Requests: NORMAL

## 2015-06-18 LAB — HEPATITIS C ANTIBODY

## 2015-06-19 ENCOUNTER — Telehealth: Payer: Self-pay

## 2015-06-19 NOTE — Telephone Encounter (Signed)
Order faxed.

## 2015-06-19 NOTE — Telephone Encounter (Signed)
[facility] [facility] sales representative at [facility] left v/m requested written order faxed to [phone] for out patient PT. Pt was given PT orders for home health PT; pt prefers PT given to pt by [facility].Please advise.

## 2015-06-19 NOTE — Telephone Encounter (Signed)
RX placed in MYD box 

## 2015-06-22 DIAGNOSIS — M069 Rheumatoid arthritis, unspecified: Secondary | ICD-10-CM | POA: Diagnosis not present

## 2015-06-22 DIAGNOSIS — M6281 Muscle weakness (generalized): Secondary | ICD-10-CM | POA: Diagnosis not present

## 2015-06-26 DIAGNOSIS — M6281 Muscle weakness (generalized): Secondary | ICD-10-CM | POA: Diagnosis not present

## 2015-06-26 DIAGNOSIS — M069 Rheumatoid arthritis, unspecified: Secondary | ICD-10-CM | POA: Diagnosis not present

## 2015-06-27 DIAGNOSIS — M1990 Unspecified osteoarthritis, unspecified site: Secondary | ICD-10-CM | POA: Diagnosis not present

## 2015-06-27 DIAGNOSIS — M150 Primary generalized (osteo)arthritis: Secondary | ICD-10-CM | POA: Diagnosis not present

## 2015-06-27 DIAGNOSIS — R945 Abnormal results of liver function studies: Secondary | ICD-10-CM | POA: Diagnosis not present

## 2015-06-27 DIAGNOSIS — E213 Hyperparathyroidism, unspecified: Secondary | ICD-10-CM | POA: Diagnosis not present

## 2015-06-29 ENCOUNTER — Ambulatory Visit: Payer: Medicare Other | Admitting: Internal Medicine

## 2015-07-09 DIAGNOSIS — M069 Rheumatoid arthritis, unspecified: Secondary | ICD-10-CM | POA: Diagnosis not present

## 2015-07-09 DIAGNOSIS — M6281 Muscle weakness (generalized): Secondary | ICD-10-CM | POA: Diagnosis not present

## 2015-07-18 DIAGNOSIS — M1990 Unspecified osteoarthritis, unspecified site: Secondary | ICD-10-CM | POA: Diagnosis not present

## 2015-07-20 DIAGNOSIS — D473 Essential (hemorrhagic) thrombocythemia: Secondary | ICD-10-CM | POA: Diagnosis not present

## 2015-07-20 DIAGNOSIS — M4326 Fusion of spine, lumbar region: Secondary | ICD-10-CM | POA: Diagnosis not present

## 2015-07-20 DIAGNOSIS — M545 Low back pain: Secondary | ICD-10-CM | POA: Diagnosis not present

## 2015-07-20 DIAGNOSIS — M150 Primary generalized (osteo)arthritis: Secondary | ICD-10-CM | POA: Diagnosis not present

## 2015-07-20 DIAGNOSIS — E213 Hyperparathyroidism, unspecified: Secondary | ICD-10-CM | POA: Diagnosis not present

## 2015-07-20 DIAGNOSIS — M1990 Unspecified osteoarthritis, unspecified site: Secondary | ICD-10-CM | POA: Diagnosis not present

## 2015-07-27 ENCOUNTER — Other Ambulatory Visit: Payer: Self-pay | Admitting: Rheumatology

## 2015-07-27 DIAGNOSIS — M545 Low back pain: Secondary | ICD-10-CM

## 2015-08-16 DIAGNOSIS — M25562 Pain in left knee: Secondary | ICD-10-CM | POA: Diagnosis not present

## 2015-08-16 DIAGNOSIS — G8929 Other chronic pain: Secondary | ICD-10-CM | POA: Diagnosis not present

## 2015-08-20 ENCOUNTER — Ambulatory Visit: Payer: Medicare Other

## 2015-08-20 ENCOUNTER — Ambulatory Visit
Admission: RE | Admit: 2015-08-20 | Discharge: 2015-08-20 | Disposition: A | Discharge status: Home / Self Care | Payer: Medicare Other | Source: Ambulatory Visit | Attending: Rheumatology | Admitting: Rheumatology

## 2015-08-20 DIAGNOSIS — M7138 Other bursal cyst, other site: Secondary | ICD-10-CM | POA: Diagnosis not present

## 2015-08-20 DIAGNOSIS — M4690 Unspecified inflammatory spondylopathy, site unspecified: Secondary | ICD-10-CM | POA: Diagnosis not present

## 2015-08-20 DIAGNOSIS — M5126 Other intervertebral disc displacement, lumbar region: Secondary | ICD-10-CM | POA: Diagnosis not present

## 2015-08-20 DIAGNOSIS — M4806 Spinal stenosis, lumbar region: Secondary | ICD-10-CM | POA: Diagnosis not present

## 2015-08-20 DIAGNOSIS — G8929 Other chronic pain: Secondary | ICD-10-CM | POA: Diagnosis not present

## 2015-08-20 DIAGNOSIS — M545 Low back pain: Secondary | ICD-10-CM | POA: Diagnosis not present

## 2015-08-23 DIAGNOSIS — M4806 Spinal stenosis, lumbar region: Secondary | ICD-10-CM | POA: Diagnosis not present

## 2015-08-28 DIAGNOSIS — E213 Hyperparathyroidism, unspecified: Secondary | ICD-10-CM | POA: Diagnosis not present

## 2015-08-28 DIAGNOSIS — M1990 Unspecified osteoarthritis, unspecified site: Secondary | ICD-10-CM | POA: Diagnosis not present

## 2015-08-28 DIAGNOSIS — M545 Low back pain: Secondary | ICD-10-CM | POA: Diagnosis not present

## 2015-08-28 DIAGNOSIS — Z79899 Other long term (current) drug therapy: Secondary | ICD-10-CM | POA: Diagnosis not present

## 2015-08-28 DIAGNOSIS — G8929 Other chronic pain: Secondary | ICD-10-CM | POA: Diagnosis not present

## 2015-09-24 DIAGNOSIS — M5416 Radiculopathy, lumbar region: Secondary | ICD-10-CM | POA: Diagnosis not present

## 2015-09-24 DIAGNOSIS — M5136 Other intervertebral disc degeneration, lumbar region: Secondary | ICD-10-CM | POA: Diagnosis not present

## 2015-09-24 DIAGNOSIS — M4806 Spinal stenosis, lumbar region: Secondary | ICD-10-CM | POA: Diagnosis not present

## 2015-10-15 ENCOUNTER — Encounter: Payer: Self-pay | Admitting: Internal Medicine

## 2015-10-15 ENCOUNTER — Ambulatory Visit (INDEPENDENT_AMBULATORY_CARE_PROVIDER_SITE_OTHER): Payer: Medicare Other | Admitting: Internal Medicine

## 2015-10-15 VITALS — BP 122/80 | HR 101 | Temp 98.1°F | Wt 163.0 lb

## 2015-10-15 DIAGNOSIS — K589 Irritable bowel syndrome without diarrhea: Secondary | ICD-10-CM

## 2015-10-15 NOTE — Progress Notes (Signed)
Pre visit review using our clinic review tool, if applicable. No additional management support is needed unless otherwise documented below in the visit note. 

## 2015-10-15 NOTE — Patient Instructions (Signed)
Take the miralax (full capful in 8 ounces water) daily. If you are having ongoing problems even after 2-3 weeks, contact me by email on MyChart with what is going on---and I will make other suggestions.

## 2015-10-15 NOTE — Progress Notes (Signed)
Subjective:    Patient ID: Wendy Murray, female    DOB: [birth year], 80 y.o.   MRN: [id number]  HPI Here due to ongoing problems with IBS Usually has urgency--but then can't go and will have to strain. This will be followed within an hour with several loose stools Occasionally will vomit then also Has bloating and cramping--no set pain  Working on diet---FODMAP mostly lactaid mild but some cheese. Doesn't think she is lactose intolerant but plays is safe Using miralax daily now  Current Outpatient Prescriptions on File Prior to Visit  Medication Sig Dispense Refill  . acetaminophen (TYLENOL) 500 MG tablet Take 500 mg by mouth every 6 (six) hours as needed for mild pain or headache.     . cholecalciferol (VITAMIN D) 1000 UNITS tablet Take 1,000 Units by mouth daily.    . clonazePAM (KLONOPIN) 1 MG tablet Take 1 mg by mouth at bedtime.     Marland Kitchen levothyroxine (LEVOTHROID) 75 MCG tablet Take 1 tablet (75 mcg total) by mouth daily before breakfast. 90 tablet 3  . liothyronine (CYTOMEL) 5 MCG tablet TAKE TWO TABLETS DAILY 180 tablet 0  . mirtazapine (REMERON) 15 MG tablet Take 15 mg by mouth at bedtime.     . Multiple Vitamin (MULTIVITAMIN WITH MINERALS) TABS tablet Take 1 tablet by mouth daily.    Marland Kitchen venlafaxine XR (EFFEXOR-XR) 150 MG 24 hr capsule Take 150 mg by mouth daily. Pt takes with a 75mg  capsule.    . venlafaxine XR (EFFEXOR-XR) 75 MG 24 hr capsule Take 75 mg by mouth daily. Pt takes with a 150mg  capsule.     No current facility-administered medications on file prior to visit.    No Known Allergies  Past Medical History  Diagnosis Date  . Hyperlipidemia   . Recurrent major depression ([place])   . Hypothyroidism   . DVT (deep venous thrombosis) ([place]) 2012    after TKR  . Osteoarthritis, knee   . IBS (irritable bowel syndrome)   . Diverticulosis     Past Surgical History  Procedure Laterality Date  . Meniscus repair Left 11/11  . Total knee arthroplasty Left 2012  .  Lumbar fusion  1993  . Cholecystectomy  1998  . Abdominal hysterectomy  1991  . Cataract extraction w/ intraocular lens  implant, bilateral  2014  . Dexa  3/09    Normal  . Hammer toe surgery  5/16    Dr Barkley Bruns    Family History  Problem Relation Age of Onset  . Cancer Mother     colon  . Depression Mother   . Cancer Father   . Depression Sister   . Hypertension Neg Hx   . Heart disease Neg Hx   . Diabetes Neg Hx     Social History   Social History  . Marital Status: Married    Spouse Name: N/A  . Number of Children: 3  . Years of Education: N/A   Occupational History  . 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 Smoking status: Former Smoker    Quit date: 06/16/1981  . Smokeless tobacco: Never Used     Comment: quit 1983  . Alcohol Use: 0.0 oz/week    0 Standard drinks or equivalent per week     Comment: rarely  . Drug Use: No  . Sexual Activity: No   Other Topics Concern  . Not on file   Social History Narrative  [place]      Has living will    Husband, then daughter Joycelyn Schmid, have health care POA   Has DNR ---order rewritten   No feeding tube if cognitively unaware   Review of Systems Appetite is good Trying to cut down on quantity No normal bowels in some time Fiber not helpful in past No blood in stool    Objective:   Physical Exam  Constitutional: She appears well-developed and well-nourished. No distress.  Abdominal: Soft. She exhibits no distension and no mass. There is no tenderness. There is no rebound and no guarding.          Assessment & Plan:

## 2015-10-15 NOTE — Assessment & Plan Note (Signed)
Chronic problem Urgency, diarrhea--then cramping Finally trying the miralax daily and a customized low FODMAP diet Will see how this works and adjust as needed

## 2015-10-24 DIAGNOSIS — Z79899 Other long term (current) drug therapy: Secondary | ICD-10-CM | POA: Diagnosis not present

## 2015-10-24 DIAGNOSIS — M1990 Unspecified osteoarthritis, unspecified site: Secondary | ICD-10-CM | POA: Diagnosis not present

## 2015-10-24 DIAGNOSIS — E213 Hyperparathyroidism, unspecified: Secondary | ICD-10-CM | POA: Diagnosis not present

## 2015-11-14 DIAGNOSIS — E213 Hyperparathyroidism, unspecified: Secondary | ICD-10-CM | POA: Diagnosis not present

## 2015-11-14 DIAGNOSIS — M1990 Unspecified osteoarthritis, unspecified site: Secondary | ICD-10-CM | POA: Diagnosis not present

## 2015-11-14 DIAGNOSIS — G8929 Other chronic pain: Secondary | ICD-10-CM | POA: Diagnosis not present

## 2015-11-14 DIAGNOSIS — M545 Low back pain: Secondary | ICD-10-CM | POA: Diagnosis not present

## 2015-12-14 ENCOUNTER — Encounter: Payer: Self-pay | Admitting: Internal Medicine

## 2015-12-14 ENCOUNTER — Ambulatory Visit (INDEPENDENT_AMBULATORY_CARE_PROVIDER_SITE_OTHER): Payer: Medicare Other | Admitting: Internal Medicine

## 2015-12-14 VITALS — BP 118/82 | HR 87 | Temp 97.5°F | Ht 65.0 in | Wt 167.0 lb

## 2015-12-14 DIAGNOSIS — M1990 Unspecified osteoarthritis, unspecified site: Secondary | ICD-10-CM | POA: Insufficient documentation

## 2015-12-14 DIAGNOSIS — E039 Hypothyroidism, unspecified: Secondary | ICD-10-CM

## 2015-12-14 DIAGNOSIS — E213 Hyperparathyroidism, unspecified: Secondary | ICD-10-CM | POA: Insufficient documentation

## 2015-12-14 DIAGNOSIS — H532 Diplopia: Secondary | ICD-10-CM | POA: Insufficient documentation

## 2015-12-14 DIAGNOSIS — Z0000 Encounter for general adult medical examination without abnormal findings: Secondary | ICD-10-CM | POA: Diagnosis not present

## 2015-12-14 DIAGNOSIS — Z7189 Other specified counseling: Secondary | ICD-10-CM

## 2015-12-14 DIAGNOSIS — F3340 Major depressive disorder, recurrent, in remission, unspecified: Secondary | ICD-10-CM | POA: Diagnosis not present

## 2015-12-14 LAB — CBC WITH DIFFERENTIAL/PLATELET
BASOS PCT: 0.4 % (ref 0.0–3.0)
Basophils Absolute: 0 10*3/uL (ref 0.0–0.1)
EOS PCT: 2.5 % (ref 0.0–5.0)
Eosinophils Absolute: 0.2 10*3/uL (ref 0.0–0.7)
HCT: 40.1 % (ref 36.0–46.0)
Hemoglobin: 13.1 g/dL (ref 12.0–15.0)
LYMPHS ABS: 1.7 10*3/uL (ref 0.7–4.0)
Lymphocytes Relative: 28.4 % (ref 12.0–46.0)
MCHC: 32.7 g/dL (ref 30.0–36.0)
MCV: 82.6 fl (ref 78.0–100.0)
MONOS PCT: 12 % (ref 3.0–12.0)
Monocytes Absolute: 0.7 10*3/uL (ref 0.1–1.0)
NEUTROS ABS: 3.4 10*3/uL (ref 1.4–7.7)
NEUTROS PCT: 56.7 % (ref 43.0–77.0)
PLATELETS: 401 10*3/uL — AB (ref 150.0–400.0)
RBC: 4.86 Mil/uL (ref 3.87–5.11)
RDW: 16 % — AB (ref 11.5–15.5)
WBC: 6 10*3/uL (ref 4.0–10.5)

## 2015-12-14 LAB — COMPREHENSIVE METABOLIC PANEL
ALT: 22 U/L (ref 0–35)
AST: 20 U/L (ref 0–37)
Albumin: 4.2 g/dL (ref 3.5–5.2)
Alkaline Phosphatase: 114 U/L (ref 39–117)
BUN: 22 mg/dL (ref 6–23)
CHLORIDE: 104 meq/L (ref 96–112)
CO2: 31 meq/L (ref 19–32)
Calcium: 10.8 mg/dL — ABNORMAL HIGH (ref 8.4–10.5)
Creatinine, Ser: 0.89 mg/dL (ref 0.40–1.20)
GFR: 65.13 mL/min (ref >60.00)
GLUCOSE: 91 mg/dL (ref 70–99)
POTASSIUM: 4.9 meq/L (ref 3.5–5.1)
SODIUM: 139 meq/L (ref 135–145)
Total Bilirubin: 0.4 mg/dL (ref 0.2–1.2)
Total Protein: 7.2 g/dL (ref 6.0–8.3)

## 2015-12-14 LAB — TSH: TSH: 1.53 u[IU]/mL (ref 0.35–4.50)

## 2015-12-14 LAB — SEDIMENTATION RATE: Sed Rate: 6 mm/hr (ref 0–30)

## 2015-12-14 LAB — T4, FREE: Free T4: 0.64 ng/dL (ref 0.60–1.60)

## 2015-12-14 NOTE — Assessment & Plan Note (Signed)
Seems eye related May benefit from prisms--will see eye doctor

## 2015-12-14 NOTE — Assessment & Plan Note (Signed)
No symptoms Will recheck levels

## 2015-12-14 NOTE — Assessment & Plan Note (Signed)
Probably post infectious Better now Will just check labs

## 2015-12-14 NOTE — Assessment & Plan Note (Signed)
I have personally reviewed the Medicare Annual Wellness questionnaire and have noted 1. [name] medical and social history 2. Their use of alcohol, tobacco or illicit drugs 3. Their current medications and supplements 4. [name] functional ability including ADL's, fall risks, home safety risks and hearing or visual             impairment. 5. Diet and physical activities 6. Evidence for depression or mood disorders  The patients weight, height, BMI and visual acuity have been recorded in the chart I have made referrals, counseling and provided education to the patient based review of the above and I have provided the pt with a written personalized care plan for preventive services.  I have provided you with a copy of your personalized plan for preventive services. Please take the time to review along with your updated medication list.  Yearly flu vaccine Will probably have 1 more mammogram Stays fit--discussed walking stick for stability

## 2015-12-14 NOTE — Progress Notes (Signed)
Subjective:    Patient ID: Wendy Murray, female    DOB: [birth year], 80 y.o.   MRN: [id number]  HPI Here for Medicare wellness and follow up of chronic health conditions Reviewed form and advanced directives Reviewed other doctors No alcohol or tobacco Has noticed double vision at times---no evaluation for this  Her joints are better The inflamed joints are better No longer seeing Dr Jefm Bryant  Has had trouble with balance Mostly in her house Has fallen a few times---mostly at home (on carpet) Has had to slow down No tremor, change in handwriting No numbness or other sensory changes Hearing is not great--considering hearing aide No alcohol or tobacco Tries to walk every day Frequent falls--without sig injury Independent with instrumental ADLs No major cognitive changes  Constipation is better with miralax No muscle aches, etc Due for recheck of PTH  Mood has been good Sees Dr Quentin Cornwall in [place] --- for 25 years  Same thyroid meds Energy is good No change in hair, skin or nails  Current Outpatient Prescriptions on File Prior to Visit  Medication Sig Dispense Refill  . acetaminophen (TYLENOL) 500 MG tablet Take 500 mg by mouth every 6 (six) hours as needed for mild pain or headache.     . cholecalciferol (VITAMIN D) 1000 UNITS tablet Take 1,000 Units by mouth daily.    . clonazePAM (KLONOPIN) 1 MG tablet Take 1 mg by mouth at bedtime.     Marland Kitchen levothyroxine (LEVOTHROID) 75 MCG tablet Take 1 tablet (75 mcg total) by mouth daily before breakfast. 90 tablet 3  . liothyronine (CYTOMEL) 5 MCG tablet TAKE TWO TABLETS DAILY 180 tablet 0  . mirtazapine (REMERON) 15 MG tablet Take 15 mg by mouth at bedtime.     . Multiple Vitamin (MULTIVITAMIN WITH MINERALS) TABS tablet Take 1 tablet by mouth daily.    . Polyethylene Glycol 3350 (MIRALAX PO) Take 1 packet by mouth daily.    . Probiotic Product (ALIGN) 4 MG CAPS Take 1 capsule by mouth daily.    Marland Kitchen venlafaxine XR  (EFFEXOR-XR) 150 MG 24 hr capsule Take 150 mg by mouth daily. Pt takes with a 75mg  capsule.    . venlafaxine XR (EFFEXOR-XR) 75 MG 24 hr capsule Take 75 mg by mouth daily. Pt takes with a 150mg  capsule.     No current facility-administered medications on file prior to visit.    No Known Allergies  Past Medical History  Diagnosis Date  . Hyperlipidemia   . Recurrent major depression ([place])   . Hypothyroidism   . DVT (deep venous thrombosis) ([place]) 2012    after TKR  . Osteoarthritis, knee   . IBS (irritable bowel syndrome)   . Diverticulosis   . Hyperparathyroidism ([place])   . Inflammatory arthritis [facility])     Past Surgical History  Procedure Laterality Date  . Meniscus repair Left 11/11  . Total knee arthroplasty Left 2012  . Lumbar fusion  1993  . Cholecystectomy  1998  . Abdominal hysterectomy  1991  . Cataract extraction w/ intraocular lens  implant, bilateral  2014  . Dexa  3/09    Normal  . Hammer toe surgery  5/16    Dr Barkley Bruns    Family History  Problem Relation Age of Onset  . Cancer Mother     colon  . Depression Mother   . Cancer Father   . Depression Sister   . Hypertension Neg Hx   . Heart disease Neg Hx   .  Diabetes Neg Hx     Social History   Social History  . Marital Status: Married    Spouse Name: N/A  . Number of Children: 3  . Years of Education: N/A   Occupational History  . 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 Smoking status: Former Smoker    Quit date: 06/16/1981  . Smokeless tobacco: Never Used     Comment: quit 1983  . Alcohol Use: 0.0 oz/week    0 Standard drinks or equivalent per week     Comment: rarely  . Drug Use: No  . Sexual Activity: No   Other Topics Concern  . Not on file   Social History Narrative   [place] in [place]      Has living will    Husband, then daughter Joycelyn Schmid, have health care POA   Has DNR ---order rewritten   No feeding tube  if cognitively unaware   Review of Systems No facial droop, aphasia, focal weakness or other signs of stroke Sleeps well-- with clonazepam and mirtazapine Appetite is fine Weight is up slightly Wears seat belt No chest pain or SOB No dizziness or syncope Bowels are fine--no blood in stool No urinary problems No rash or suspicious lesions    Objective:   Physical Exam  Constitutional: She is oriented to person, place, and time. She appears well-developed and well-nourished. No distress.  HENT:  Mouth/Throat: Oropharynx is clear and moist. No oropharyngeal exudate.  Eyes:  Not perfectly aligned and slight alternating esophoria  Neck: Normal range of motion. Neck supple. No thyromegaly present.  Cardiovascular: Normal rate, regular rhythm, normal heart sounds and intact distal pulses.  Exam reveals no gallop.   No murmur heard. Pulmonary/Chest: Effort normal and breath sounds normal. No respiratory distress. She has no wheezes. She has no rales.  Abdominal: Soft. There is no tenderness.  Musculoskeletal: She exhibits no edema or tenderness.  No active synovitis  Lymphadenopathy:    She has no cervical adenopathy.  Neurological: She is alert and oriented to person, place, and time.  President- "Bing [place]ume" [id number] Recall 3/3  No tremor, stiffness or bradykinesia  Skin: No rash noted. No erythema.  Psychiatric: She has a normal mood and affect. Her behavior is normal.          Assessment & Plan:

## 2015-12-14 NOTE — Patient Instructions (Signed)
You can set up a final screening mammogram in the next year or so.

## 2015-12-14 NOTE — Assessment & Plan Note (Signed)
Remission on Rx Continues with her psychiatrist

## 2015-12-14 NOTE — Assessment & Plan Note (Signed)
Seems euthyroid ?Will check labs ?

## 2015-12-14 NOTE — Progress Notes (Signed)
Pre visit review using our clinic review tool, if applicable. No additional management support is needed unless otherwise documented below in the visit note. 

## 2015-12-14 NOTE — Assessment & Plan Note (Signed)
Has DNR 

## 2015-12-17 ENCOUNTER — Other Ambulatory Visit: Payer: Self-pay | Admitting: Internal Medicine

## 2015-12-17 LAB — PARATHYROID HORMONE, INTACT (NO CA): PTH: 88 pg/mL — ABNORMAL HIGH (ref 14–64)

## 2016-02-07 DIAGNOSIS — H26491 Other secondary cataract, right eye: Secondary | ICD-10-CM | POA: Diagnosis not present

## 2016-03-10 ENCOUNTER — Telehealth: Payer: Self-pay | Admitting: Internal Medicine

## 2016-03-10 DIAGNOSIS — H9190 Unspecified hearing loss, unspecified ear: Secondary | ICD-10-CM

## 2016-03-10 NOTE — Telephone Encounter (Signed)
Pt would like referral to [facility] and [employer].  Pt has appointment for 03/11/16 11am.  Please fax referral to [phone].  Pt states that we have a couple of days to provider documentation.  Pt has [place].  Best number to call is [phone] when completed.

## 2016-03-11 DIAGNOSIS — H903 Sensorineural hearing loss, bilateral: Secondary | ICD-10-CM | POA: Diagnosis not present

## 2016-03-11 NOTE — Telephone Encounter (Signed)
9/26-Referral faxed as requested. Pt notified.

## 2016-03-12 ENCOUNTER — Other Ambulatory Visit: Payer: Self-pay | Admitting: Internal Medicine

## 2016-04-09 DIAGNOSIS — Z23 Encounter for immunization: Secondary | ICD-10-CM | POA: Diagnosis not present

## 2016-06-06 ENCOUNTER — Ambulatory Visit (INDEPENDENT_AMBULATORY_CARE_PROVIDER_SITE_OTHER): Payer: Medicare Other | Admitting: Internal Medicine

## 2016-06-06 ENCOUNTER — Encounter: Payer: Self-pay | Admitting: Internal Medicine

## 2016-06-06 VITALS — BP 128/84 | HR 81 | Temp 97.5°F | Wt 171.0 lb

## 2016-06-06 DIAGNOSIS — K529 Noninfective gastroenteritis and colitis, unspecified: Secondary | ICD-10-CM | POA: Diagnosis not present

## 2016-06-06 NOTE — Assessment & Plan Note (Signed)
Mostly better No dehydration Recommended stopping the imodium Keep up with fluids Reassured--no apparent serious problem

## 2016-06-06 NOTE — Progress Notes (Signed)
Subjective:    Patient ID: Wendy Murray, female    DOB: 07/24/1935, 80 y.o.   MRN: [id number]  HPI Here due to GI illness  Diarrhea for 3 days  Also headace Started with vomiting also--- only once Gets stomach "ache"---improves after diarrhea sometimes Pain broadly in mid abdomen  Appetite is off Now eating bland diet  Now with short "spurts" of diarrhea Then recurs a while later Has taking imodium-- 3 total in past 2 days  No blood in stool  Current Outpatient Prescriptions on File Prior to Visit  Medication Sig Dispense Refill  . acetaminophen (TYLENOL) 500 MG tablet Take 500 mg by mouth every 6 (six) hours as needed for mild pain or headache.     . cholecalciferol (VITAMIN D) 1000 UNITS tablet Take 1,000 Units by mouth daily.    . clonazePAM (KLONOPIN) 1 MG tablet Take 1 mg by mouth at bedtime.     Marland Kitchen levothyroxine (SYNTHROID, LEVOTHROID) 75 MCG tablet TAKE 1 TABLET (75 MCG TOTAL) BY MOUTH DAILY BEFORE BREAKFAST. 90 tablet 3  . liothyronine (CYTOMEL) 5 MCG tablet TAKE TWO TABLETS DAILY 180 tablet 3  . mirtazapine (REMERON) 15 MG tablet Take 15 mg by mouth at bedtime.     . Multiple Vitamin (MULTIVITAMIN WITH MINERALS) TABS tablet Take 1 tablet by mouth daily.    . Polyethylene Glycol 3350 (MIRALAX PO) Take 1 packet by mouth daily.    . Probiotic Product (ALIGN) 4 MG CAPS Take 1 capsule by mouth daily.    Marland Kitchen venlafaxine XR (EFFEXOR-XR) 150 MG 24 hr capsule Take 150 mg by mouth daily. Pt takes with a 75mg  capsule.    . venlafaxine XR (EFFEXOR-XR) 75 MG 24 hr capsule Take 75 mg by mouth daily. Pt takes with a 150mg  capsule.     No current facility-administered medications on file prior to visit.     No Known Allergies  Past Medical History:  Diagnosis Date  . Diverticulosis   . DVT (deep venous thrombosis) ([place]) 2012   after TKR  . Hyperlipidemia   . Hyperparathyroidism ([place])   . Hypothyroidism   . IBS (irritable bowel syndrome)   . Inflammatory arthritis   .  Osteoarthritis, knee   . Recurrent major depression ([place])     Past Surgical History:  Procedure Laterality Date  . ABDOMINAL HYSTERECTOMY  1991  . CATARACT EXTRACTION W/ INTRAOCULAR LENS  IMPLANT, BILATERAL  2014  . CHOLECYSTECTOMY  1998  . DEXA  3/09   Normal  . HAMMER TOE SURGERY  5/16   Dr Barkley Bruns  . LUMBAR FUSION  1993  . MENISCUS REPAIR Left 11/11  . TOTAL KNEE ARTHROPLASTY Left 2012    Family History  Problem Relation Age of Onset  . Cancer Mother     colon  . Depression Mother   . Cancer Father   . Depression Sister   . Hypertension Neg Hx   . Heart disease Neg Hx   . Diabetes Neg Hx     Social History   Social History  . Marital status: Married    Spouse name: N/A  . Number of children: 3  . Years of education: N/A   Occupational History  . 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 Smoking status: Former Smoker    Quit date: 06/16/1981  . Smokeless tobacco: Never Used     Comment: quit 1983  . Alcohol use 0.0 oz/week  Comment: rarely  . Drug use: No  . Sexual activity: No   Other Topics Concern  . Not on file   Social History Narrative   [place] in [place]      Has living will    Husband, then daughter Joycelyn Schmid, have health care POA   Has DNR ---order rewritten   No feeding tube if cognitively unaware   Review of Systems  No fever No cough or SOB     Objective:   Physical Exam  Constitutional: She appears well-nourished. No distress.  Neck: Normal range of motion. Neck supple.  Pulmonary/Chest: Effort normal and breath sounds normal. No respiratory distress. She has no wheezes. She has no rales.  Abdominal: Soft. Bowel sounds are normal. She exhibits no distension. There is no tenderness. There is no rebound and no guarding.  Lymphadenopathy:    She has no cervical adenopathy.          Assessment & Plan:

## 2016-06-06 NOTE — Patient Instructions (Signed)
Please stop the imodium. Keep up with your fluids and slowly add back more food as tolerated.

## 2016-06-06 NOTE — Progress Notes (Signed)
Pre visit review using our clinic review tool, if applicable. No additional management support is needed unless otherwise documented below in the visit note. 

## 2016-08-18 ENCOUNTER — Ambulatory Visit (INDEPENDENT_AMBULATORY_CARE_PROVIDER_SITE_OTHER): Payer: Medicare Other | Admitting: Family Medicine

## 2016-08-18 ENCOUNTER — Encounter: Payer: Self-pay | Admitting: Family Medicine

## 2016-08-18 DIAGNOSIS — R112 Nausea with vomiting, unspecified: Secondary | ICD-10-CM

## 2016-08-18 DIAGNOSIS — R197 Diarrhea, unspecified: Secondary | ICD-10-CM | POA: Diagnosis not present

## 2016-08-18 NOTE — Progress Notes (Signed)
Subjective:    Patient ID: Wendy Murray, female    DOB: 1935/12/27, 81 y.o.   MRN: [id number]  HPI This is a 81 yo female who presents today with vomiting and diarrhea x 4 days. Started with uncontrollable vomiting then diarrhea started. Had about 25 small, loose bowel movements. Last vomiting x 1 yesterday. Has been drinking water. Felt like a knot at the top of her stomach yesterday, this has resolved today. No abdominal pain. Had a fever two days ago to 101.5. No known sick contacts, no eating out. Has had a clear runny nose, no sore throat, mild, dry cough. Ribs are sore from coughing. Tried to eat roast beef last night- vomited. Feels weak. No chest pain or SOB, no generalized aches. Didn't sleep well last night due to feeling of pressure at top of stomach.   Past Medical History:  Diagnosis Date  . Diverticulosis   . DVT (deep venous thrombosis) ([place]) 2012   after TKR  . Hyperlipidemia   . Hyperparathyroidism ([place])   . Hypothyroidism   . IBS (irritable bowel syndrome)   . Inflammatory arthritis   . Osteoarthritis, knee   . Recurrent major depression ([place])    Past Surgical History:  Procedure Laterality Date  . ABDOMINAL HYSTERECTOMY  1991  . CATARACT EXTRACTION W/ INTRAOCULAR LENS  IMPLANT, BILATERAL  2014  . CHOLECYSTECTOMY  1998  . DEXA  3/09   Normal  . HAMMER TOE SURGERY  5/16   Dr Barkley Bruns  . LUMBAR FUSION  1993  . MENISCUS REPAIR Left 11/11  . TOTAL KNEE ARTHROPLASTY Left 2012   Family History  Problem Relation Age of Onset  . Cancer Mother     colon  . Depression Mother   . Cancer Father   . Depression Sister   . Hypertension Neg Hx   . Heart disease Neg Hx   . Diabetes Neg Hx    Social History  Substance Use Topics  . Smoking status: Former Smoker    Quit date: 06/16/1981  . Smokeless tobacco: Never Used     Comment: quit 1983  . Alcohol use 0.0 oz/week     Comment: rarely      Review of Systems Per HPI    Objective:   Physical Exam    Constitutional: She is oriented to person, place, and time. She appears well-developed and well-nourished. No distress.  HENT:  Mouth/Throat: Mucous membranes are dry.  Cardiovascular: Normal rate, regular rhythm and normal heart sounds.   Pulmonary/Chest: Effort normal and breath sounds normal.  Abdominal: Soft. She exhibits no distension. Bowel sounds are increased. There is no tenderness. There is no rebound and no guarding.  Neurological: She is alert and oriented to person, place, and time.  Skin: Skin is warm and dry. She is not diaphoretic.  Psychiatric: She has a normal mood and affect. Her behavior is normal. Judgment and thought content normal.  Vitals reviewed.     BP 102/77 (BP Location: Left Arm, Patient Position: Sitting, Cuff Size: Normal)   Pulse 97   Temp 97.7 F (36.5 C) (Oral)   Resp 18   Wt 165 lb (74.8 kg)   SpO2 96%   BMI 27.46 kg/m  Wt Readings from Last 3 Encounters:  08/18/16 165 lb (74.8 kg)  06/06/16 171 lb (77.6 kg)  12/14/15 167 lb (75.8 kg)   EKG- NSR    Assessment & Plan:  1. Non-intractable vomiting with nausea, unspecified vomiting type - EKG 12-Lead -  less vomiting and diarrhea today, no abdominal pain, discussed need to hydrate and if unable to take in adequate PO fluids would need to go to ER, patient verbalized understanding and would like to try to increase hydration throughout the afternoon and will go ER if not able to keep down liquids  - suspect a viral gastroenteritis with more severe symptoms due to her IBS.  2. Diarrhea, unspecified type - see #1 - provided written and verbal information regarding BRAT diet and advised her to slowly advance diet as tolerated - can take 2-3 doses of immodium   Clarene Reamer, FNP-BC  [facility]  08/19/2016 2:19 PM

## 2016-08-18 NOTE — Patient Instructions (Signed)
Drink 1-2 ounces of Gatorade or Gatorade mixed with water every 15 - 20 minutes  If you are unable to drink enough fluids to make your urine light yellow, please go to the emergency room or if you are having more vomiting that won't stop Can take immodium if diarrhea does not slow down Slowly advance diet as tolerated. Eat very small amounts to start- like 2 crackers, 1/2 piece toast, or a couple of spoonfuls of applesauce or 1/3 bananna   Bland Diet A bland diet consists of foods that do not have a lot of fat or fiber. Foods without fat or fiber are easier for the body to digest. They are also less likely to irritate your mouth, throat, stomach, and other parts of your gastrointestinal tract. A bland diet is sometimes called a BRAT diet. What is my plan? Your health care provider or dietitian may recommend specific changes to your diet to prevent and treat your symptoms, such as:  Eating small meals often.  Cooking food until it is soft enough to chew easily.  Chewing your food well.  Drinking fluids slowly.  Not eating foods that are very spicy, sour, or fatty.  Not eating citrus fruits, such as oranges and grapefruit. What do I need to know about this diet?  Eat a variety of foods from the bland diet food list.  Do not follow a bland diet longer than you have to.  Ask your health care provider whether you should take vitamins. What foods can I eat? Grains   Hot cereals, such as cream of wheat. Bread, crackers, or tortillas made from refined white flour. Rice. Vegetables  Canned or cooked vegetables. Mashed or boiled potatoes. Fruits  Bananas. Applesauce. Other types of cooked or canned fruit with the skin and seeds removed, such as canned peaches or pears. Meats and Other Protein Sources  Scrambled eggs. Creamy peanut butter or other nut butters. Lean, well-cooked meats, such as chicken or fish. Tofu. Soups or broths. Dairy  Low-fat dairy products, such as milk, cottage  cheese, or yogurt. Beverages  Water. Herbal tea. Apple juice. Sweets and Desserts  Pudding. Custard. Fruit gelatin. Ice cream. Fats and Oils  Mild salad dressings. Canola or olive oil. The items listed above may not be a complete list of allowed foods or beverages. Contact your dietitian for more options.  What foods are not recommended? Foods and ingredients that are often not recommended include:  Spicy foods, such as hot sauce or salsa.  Fried foods.  Sour foods, such as pickled or fermented foods.  Raw vegetables or fruits, especially citrus or berries.  Caffeinated drinks.  Alcohol.  Strongly flavored seasonings or condiments. The items listed above may not be a complete list of foods and beverages that are not allowed. Contact your dietitian for more information.  This information is not intended to replace advice given to you by your health care provider. Make sure you discuss any questions you have with your health care provider. Document Released: 09/24/2015 Document Revised: 11/08/2015 Document Reviewed: 06/14/2014 Elsevier Interactive Patient Education  2017 [employer].

## 2016-08-18 NOTE — Progress Notes (Signed)
Pre visit review using our clinic review tool, if applicable. No additional management support is needed unless otherwise documented below in the visit note. 

## 2016-09-02 ENCOUNTER — Encounter: Payer: Self-pay | Admitting: Internal Medicine

## 2016-09-02 DIAGNOSIS — E039 Hypothyroidism, unspecified: Secondary | ICD-10-CM

## 2016-09-02 NOTE — Telephone Encounter (Signed)
I am going to tell her it is okay to stop the cytomel. Confirm with her and take it off list Also, set up lab draw in about 6 weeks

## 2016-09-02 NOTE — Telephone Encounter (Signed)
Spoke to pt. Made lab appt 10-15-16

## 2016-10-15 ENCOUNTER — Other Ambulatory Visit (INDEPENDENT_AMBULATORY_CARE_PROVIDER_SITE_OTHER): Payer: Medicare Other

## 2016-10-15 DIAGNOSIS — E039 Hypothyroidism, unspecified: Secondary | ICD-10-CM | POA: Diagnosis not present

## 2016-10-15 LAB — TSH: TSH: 2.6 u[IU]/mL (ref 0.35–4.50)

## 2016-10-15 LAB — T4, FREE: FREE T4: 0.81 ng/dL (ref 0.60–1.60)

## 2016-12-16 ENCOUNTER — Encounter: Payer: Self-pay | Admitting: Internal Medicine

## 2016-12-16 ENCOUNTER — Ambulatory Visit (INDEPENDENT_AMBULATORY_CARE_PROVIDER_SITE_OTHER): Payer: Medicare Other | Admitting: Internal Medicine

## 2016-12-16 VITALS — BP 118/80 | HR 92 | Temp 97.9°F | Ht 65.0 in | Wt 166.0 lb

## 2016-12-16 DIAGNOSIS — F3340 Major depressive disorder, recurrent, in remission, unspecified: Secondary | ICD-10-CM | POA: Diagnosis not present

## 2016-12-16 DIAGNOSIS — Z23 Encounter for immunization: Secondary | ICD-10-CM

## 2016-12-16 DIAGNOSIS — E213 Hyperparathyroidism, unspecified: Secondary | ICD-10-CM

## 2016-12-16 DIAGNOSIS — Z7189 Other specified counseling: Secondary | ICD-10-CM

## 2016-12-16 DIAGNOSIS — K219 Gastro-esophageal reflux disease without esophagitis: Secondary | ICD-10-CM | POA: Insufficient documentation

## 2016-12-16 DIAGNOSIS — Z0000 Encounter for general adult medical examination without abnormal findings: Secondary | ICD-10-CM | POA: Diagnosis not present

## 2016-12-16 DIAGNOSIS — E039 Hypothyroidism, unspecified: Secondary | ICD-10-CM

## 2016-12-16 LAB — COMPREHENSIVE METABOLIC PANEL
ALBUMIN: 4.2 g/dL (ref 3.5–5.2)
ALK PHOS: 106 U/L (ref 39–117)
ALT: 18 U/L (ref 0–35)
AST: 20 U/L (ref 0–37)
BILIRUBIN TOTAL: 0.3 mg/dL (ref 0.2–1.2)
BUN: 23 mg/dL (ref 6–23)
CHLORIDE: 105 meq/L (ref 96–112)
CO2: 29 mEq/L (ref 19–32)
CREATININE: 0.99 mg/dL (ref 0.40–1.20)
Calcium: 11.2 mg/dL — ABNORMAL HIGH (ref 8.4–10.5)
GFR: 57.45 mL/min — ABNORMAL LOW (ref >60.00)
Glucose, Bld: 118 mg/dL — ABNORMAL HIGH (ref 70–99)
Potassium: 4.9 mEq/L (ref 3.5–5.1)
SODIUM: 139 meq/L (ref 135–145)
TOTAL PROTEIN: 7.4 g/dL (ref 6.0–8.3)

## 2016-12-16 LAB — CBC WITH DIFFERENTIAL/PLATELET
BASOS ABS: 0 10*3/uL (ref 0.0–0.1)
Basophils Relative: 0.5 % (ref 0.0–3.0)
EOS ABS: 0.1 10*3/uL (ref 0.0–0.7)
Eosinophils Relative: 1.6 % (ref 0.0–5.0)
HCT: 42.5 % (ref 36.0–46.0)
HEMOGLOBIN: 14.1 g/dL (ref 12.0–15.0)
Lymphocytes Relative: 16.4 % (ref 12.0–46.0)
Lymphs Abs: 1.2 10*3/uL (ref 0.7–4.0)
MCHC: 33.2 g/dL (ref 30.0–36.0)
MCV: 84.6 fl (ref 78.0–100.0)
MONO ABS: 0.8 10*3/uL (ref 0.1–1.0)
Monocytes Relative: 10 % (ref 3.0–12.0)
Neutro Abs: 5.5 10*3/uL (ref 1.4–7.7)
Neutrophils Relative %: 71.5 % (ref 43.0–77.0)
Platelets: 400 10*3/uL (ref 150.0–400.0)
RBC: 5.03 Mil/uL (ref 3.87–5.11)
RDW: 14 % (ref 11.5–15.5)
WBC: 7.6 10*3/uL (ref 4.0–10.5)

## 2016-12-16 LAB — TSH: TSH: 4.18 u[IU]/mL (ref 0.35–4.50)

## 2016-12-16 LAB — T4, FREE: FREE T4: 0.67 ng/dL (ref 0.60–1.60)

## 2016-12-16 NOTE — Assessment & Plan Note (Signed)
Controlled with current regimen Keeps up with her psychiatrist

## 2016-12-16 NOTE — Patient Instructions (Signed)
Please decrease the omeprazole to every other day---then stop after a few weeks if you don't have any acid symptoms (to see if it helps the gas).

## 2016-12-16 NOTE — Progress Notes (Signed)
Subjective:    Patient ID: Wendy Murray, female    DOB: 03-14-1936, 81 y.o.   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 No injury Chronic mood issues Independent with instrumental ADLs No major memory issues  Chronic bloating Bowels are okay Will occasional vomit--it eats too fast or something too sweet Discussed low FODMAP diet Align daily and omeprazole--though no reflux symptoms  Wonders about the levothyroxine Discussed probably lifelong need No change in hair,nails, skin Weight stable  Discussed the hyperparathyroidism No kidney stones  Still sees the psychiatrist regularly Mood has been okay  Current Outpatient Prescriptions on File Prior to Visit  Medication Sig Dispense Refill  . clonazePAM (KLONOPIN) 1 MG tablet Take 1 mg by mouth at bedtime.     Marland Kitchen levothyroxine (SYNTHROID, LEVOTHROID) 75 MCG tablet TAKE 1 TABLET (75 MCG TOTAL) BY MOUTH DAILY BEFORE BREAKFAST. 90 tablet 3  . mirtazapine (REMERON) 15 MG tablet Take 15 mg by mouth at bedtime.     . Multiple Vitamin (MULTIVITAMIN WITH MINERALS) TABS tablet Take 1 tablet by mouth daily.    . Probiotic Product (ALIGN) 4 MG CAPS Take 1 capsule by mouth daily.    Marland Kitchen venlafaxine XR (EFFEXOR-XR) 150 MG 24 hr capsule Take 150 mg by mouth daily. Pt takes with a 75mg  capsule.    . venlafaxine XR (EFFEXOR-XR) 75 MG 24 hr capsule Take 75 mg by mouth daily. Pt takes with a 150mg  capsule.     No current facility-administered medications on file prior to visit.     No Known Allergies  Past Medical History:  Diagnosis Date  . Diverticulosis   . DVT (deep venous thrombosis) ([place]) 2012   after TKR  .  Hyperlipidemia   . Hyperparathyroidism ([place])   . Hypothyroidism   . IBS (irritable bowel syndrome)   . Inflammatory arthritis   . Osteoarthritis, knee   . Recurrent major depression ([place])     Past Surgical History:  Procedure Laterality Date  . ABDOMINAL HYSTERECTOMY  1991  . CATARACT EXTRACTION W/ INTRAOCULAR LENS  IMPLANT, BILATERAL  2014  . CHOLECYSTECTOMY  1998  . DEXA  3/09   Normal  . HAMMER TOE SURGERY  5/16   Dr Barkley Bruns  . LUMBAR FUSION  1993  . MENISCUS REPAIR Left 11/11  . TOTAL KNEE ARTHROPLASTY Left 2012    Family History  Problem Relation Age of Onset  . Cancer Mother        colon  . Depression Mother   . Cancer Father   . Depression Sister   . Hypertension Neg Hx   . Heart disease Neg Hx   . Diabetes Neg Hx     Social History   Social History  . Marital status: Married    Spouse name: N/A  . Number of children: 3  . Years of education: N/A   Occupational History  . 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 Smoking status: Former Smoker    Quit date: 06/16/1981  . Smokeless tobacco: Never Used     Comment: quit 1983  . Alcohol use 0.0 oz/week     Comment: rarely  . Drug  use: No  . Sexual activity: No   Other Topics Concern  . Not on file   Social History Narrative   [place] in [place]      Has living will    Husband, then daughter Joycelyn Schmid, have health care POA   Has DNR ---order rewritten   No feeding tube if cognitively unaware   Review of Systems Chronic issues with voice--but does well with singing Appetite is good Weight stable Sleeps well--with the clonazepam Wears seat belt Teeth okay--sees dentist regularly (needs wisdom tooth removed though)--- Dr Toy Cookey voids okay No rash or suspicious skin lesions No chest pain or palpitations No SOB Some mild dizziness but no syncope. Has to stand briefly after getting up before moving     Objective:   Physical  Exam  Constitutional: She is oriented to person, place, and time. She appears well-developed and well-nourished. No distress.  HENT:  Mouth/Throat: Oropharynx is clear and moist. No oropharyngeal exudate.  Neck: No thyromegaly present.  Cardiovascular: Normal rate, regular rhythm, normal heart sounds and intact distal pulses.  Exam reveals no gallop.   No murmur heard. Pulmonary/Chest: Effort normal and breath sounds normal. No respiratory distress. She has no wheezes. She has no rales.  Abdominal: Soft. There is no tenderness.  Musculoskeletal: She exhibits no edema or tenderness.  Lymphadenopathy:    She has no cervical adenopathy.  Neurological: She is alert and oriented to person, place, and time.  President-- "Daisy Floro, Obama, Bush" [phone] [id number] Recall 3/3  Skin: No rash noted. No erythema.  Psychiatric: She has a normal mood and affect. Her behavior is normal.          Assessment & Plan:

## 2016-12-16 NOTE — Assessment & Plan Note (Signed)
Not excited about surgery for this No symptoms Will check DEXA---normal in 2009

## 2016-12-16 NOTE — Assessment & Plan Note (Signed)
Seems to be euthyroid Will check labs 

## 2016-12-16 NOTE — Assessment & Plan Note (Signed)
I have personally reviewed the Medicare Annual Wellness questionnaire and have noted 1. [name] medical and social history 2. Their use of alcohol, tobacco or illicit drugs 3. Their current medications and supplements 4. [name] functional ability including ADL's, fall risks, home safety risks and hearing or visual             impairment. 5. Diet and physical activities 6. Evidence for depression or mood disorders  The patients weight, height, BMI and visual acuity have been recorded in the chart I have made referrals, counseling and provided education to the patient based review of the above and I have provided the pt with a written personalized care plan for preventive services.  I have provided you with a copy of your personalized plan for preventive services. Please take the time to review along with your updated medication list.  Will update pneumovax Yearly flu vaccine No cancer screening due to age Discussed fitness

## 2016-12-16 NOTE — Assessment & Plan Note (Signed)
No symptoms currently Discussed weaning off---?related to her gas problems

## 2016-12-16 NOTE — Assessment & Plan Note (Signed)
Has DNR 

## 2016-12-16 NOTE — Addendum Note (Signed)
Addended by: Pilar Grammes on: 12/16/2016 11:53 AM   Modules accepted: Orders

## 2017-01-11 ENCOUNTER — Encounter: Payer: Self-pay | Admitting: Internal Medicine

## 2017-01-27 ENCOUNTER — Other Ambulatory Visit: Payer: Self-pay | Admitting: Internal Medicine

## 2017-01-28 ENCOUNTER — Encounter: Payer: Self-pay | Admitting: Internal Medicine

## 2017-02-04 ENCOUNTER — Ambulatory Visit
Admission: RE | Admit: 2017-02-04 | Discharge: 2017-02-04 | Disposition: A | Discharge status: Home / Self Care | Payer: Medicare Other | Source: Ambulatory Visit | Attending: Internal Medicine | Admitting: Internal Medicine

## 2017-02-04 DIAGNOSIS — E213 Hyperparathyroidism, unspecified: Secondary | ICD-10-CM | POA: Diagnosis not present

## 2017-02-04 DIAGNOSIS — M85852 Other specified disorders of bone density and structure, left thigh: Secondary | ICD-10-CM | POA: Diagnosis not present

## 2017-02-04 DIAGNOSIS — M8588 Other specified disorders of bone density and structure, other site: Secondary | ICD-10-CM | POA: Insufficient documentation

## 2017-02-04 DIAGNOSIS — M85832 Other specified disorders of bone density and structure, left forearm: Secondary | ICD-10-CM | POA: Diagnosis not present

## 2017-02-14 ENCOUNTER — Encounter: Payer: Self-pay | Admitting: Internal Medicine

## 2017-02-25 DIAGNOSIS — H26491 Other secondary cataract, right eye: Secondary | ICD-10-CM | POA: Diagnosis not present

## 2017-03-30 ENCOUNTER — Ambulatory Visit (INDEPENDENT_AMBULATORY_CARE_PROVIDER_SITE_OTHER): Payer: Medicare Other | Admitting: Family Medicine

## 2017-03-30 ENCOUNTER — Encounter: Payer: Self-pay | Admitting: Family Medicine

## 2017-03-30 DIAGNOSIS — J069 Acute upper respiratory infection, unspecified: Secondary | ICD-10-CM | POA: Diagnosis not present

## 2017-03-30 MED ORDER — BENZONATATE 200 MG PO CAPS: 200.0000 mg | ORAL_CAPSULE | Freq: Three times a day (TID) | ORAL | 0 refills | Status: DC | PRN

## 2017-03-30 NOTE — Progress Notes (Signed)
Sx started about 24 hours ago.  She lost her voice, had a cough, ST.  Pain in throat with cough but not with swallowing.  Some better today with the cough.  Temp 102.1 yesterday.  No ear pain. Some rhinorrhea.  No vomiting, no diarrhea.  No rash.  Some occ wheeze.  Minimal scant sputum, not totally dry.  Feels some better, but she was worried about the cough and wanted to get checked.    Husband has sniffles but not a fever.    Meds, vitals, and allergies reviewed.   ROS: Per HPI unless specifically indicated in ROS section   GEN: nad, alert and oriented HEENT: mucous membranes moist, tm w/o erythema but L SOM noted, nasal exam w/o erythema, clear discharge noted,  OP with cobblestoning, sinuses not ttp NECK: supple w/o LA CV: rrr.   PULM: ctab, no inc wob EXT: no edema  Recheck pulse 100

## 2017-03-30 NOTE — Patient Instructions (Signed)
Try robitussin as needed for the cough.  Then use tessalon if needed.  Drink plenty of fluids.  Tylenol if needed for fever.  Update [place] as needed.  It looks like you have a virus and this should gradually get better.  Take care.  Glad to see you.

## 2017-03-31 DIAGNOSIS — J069 Acute upper respiratory infection, unspecified: Secondary | ICD-10-CM | POA: Insufficient documentation

## 2017-03-31 NOTE — Assessment & Plan Note (Signed)
Try robitussin as needed for the cough.  Then use tessalon if needed.  Drink plenty of fluids.  Tylenol if needed for fever.  Update [place] as needed.  It looks like she has a virus and this should gradually get better.  Nontoxic.  dw pt about ddx.

## 2017-04-01 ENCOUNTER — Telehealth: Payer: Self-pay | Admitting: Internal Medicine

## 2017-04-01 NOTE — Telephone Encounter (Signed)
Pt has appt 04/02/17 9 AM Dr Diona Browner.

## 2017-04-01 NOTE — Telephone Encounter (Signed)
[facility]  Patient Name: Wendy Murray  DOB: 1935-06-21    Initial Comment cough, productive, fever on/off, 3 days, taking tylenol and cough med.    Nurse Assessment  Nurse: Arthor Captain, RN, Margaret Date/Time (Eastern Time): 04/01/2017 3:09:26 PM  Confirm and document reason for call. If symptomatic, describe symptoms. ---Caller states she has a deep cough coming from her chest, has had a fever on and off. Symptoms started 3 days ago. Denies fever at present but did have one last night.  Does the patient have any new or worsening symptoms? ---Yes  Will a triage be completed? ---Yes  Related visit to physician within the last 2 weeks? ---Yes  Does the PT have any chronic conditions? (i.e. diabetes, asthma, etc.) ---No  Is this a behavioral health or substance abuse call? ---No     Guidelines    Guideline Title Affirmed Question Affirmed Notes  Common Cold Fever present > 3 days (72 hours)    Final Disposition User   See Physician within 24 Hours Cockrum, RN, Joycelyn Schmid    Comments  Scheduled appt for 9am tomorrow morning at the [facility] location with [employer].   Referrals  REFERRED TO PCP OFFICE   Caller Disagree/Comply Comply  Caller Understands Yes  PreDisposition Home Care

## 2017-04-02 ENCOUNTER — Ambulatory Visit (INDEPENDENT_AMBULATORY_CARE_PROVIDER_SITE_OTHER): Payer: Medicare Other | Admitting: Family Medicine

## 2017-04-02 ENCOUNTER — Encounter: Payer: Self-pay | Admitting: Family Medicine

## 2017-04-02 DIAGNOSIS — R05 Cough: Secondary | ICD-10-CM

## 2017-04-02 DIAGNOSIS — R059 Cough, unspecified: Secondary | ICD-10-CM | POA: Insufficient documentation

## 2017-04-02 MED ORDER — AZITHROMYCIN 250 MG PO TABS: ORAL_TABLET | ORAL | 0 refills | Status: DC

## 2017-04-02 NOTE — Progress Notes (Signed)
   Subjective:    Patient ID: Wendy Murray, female    DOB: 1936/02/28, 81 y.o.   MRN: [id number]  HPI    81 year old female presents for follow up cough.   She was seen on 10/15 by Dr. Damita Dunnings and dx with  Viral URI.  Recommended: Try robitussin as needed for the cough.  Then use tessalon if needed.  Drink plenty of fluids.  Tylenol if needed for fever.   Today she reports:   Now 5 days of cough, deep in chest, productive yellow sputum. Fever: 101.8.. In last 2 days. No SOB, mild wheeze. No ear pain.. Full though  No ST. No sinus pressure.  Using robitussin, humidifier, tylenol with minimal relief  Social History /Family History/Past Medical History reviewed in detail and updated in EMR if needed. No COPD,  former remote smoker  Blood pressure 100/66, pulse 85, temperature (!) 97.5 F (36.4 C), temperature source Oral, height 5\' 5"  (1.651 m), weight 164 lb (74.4 kg), SpO2 95 %.   Review of Systems  Constitutional: Negative for fatigue and fever.  HENT: Negative for congestion and sinus pressure.   Eyes: Negative for pain.  Respiratory: Positive for cough, chest tightness and wheezing. Negative for shortness of breath.   Cardiovascular: Negative for chest pain, palpitations and leg swelling.  Gastrointestinal: Negative for abdominal pain.  Genitourinary: Negative for dysuria and vaginal bleeding.  Musculoskeletal: Negative for back pain.  Neurological: Negative for syncope, light-headedness and headaches.  Psychiatric/Behavioral: Negative for dysphoric mood.       Objective:   Physical Exam  Constitutional: Vital signs are normal. She appears well-developed and well-nourished. She is cooperative.  Non-toxic appearance. She does not appear ill. No distress.  HENT:  Head: Normocephalic.  Right Ear: Hearing, external ear and ear canal normal. Tympanic membrane is not erythematous, not retracted and not bulging. A middle ear effusion is present.  Left Ear: Hearing,  external ear and ear canal normal. Tympanic membrane is not erythematous, not retracted and not bulging. A middle ear effusion is present.  Nose: Mucosal edema and rhinorrhea present. Right sinus exhibits no maxillary sinus tenderness and no frontal sinus tenderness. Left sinus exhibits no maxillary sinus tenderness and no frontal sinus tenderness.  Mouth/Throat: Uvula is midline, oropharynx is clear and moist and mucous membranes are normal.  Eyes: Pupils are equal, round, and reactive to light. Conjunctivae, EOM and lids are normal. Lids are everted and swept, no foreign bodies found.  Neck: Trachea normal and normal range of motion. Neck supple. Carotid bruit is not present. No thyroid mass and no thyromegaly present.  Cardiovascular: Normal rate, regular rhythm, S1 normal, S2 normal, normal heart sounds, intact distal pulses and normal pulses.  Exam reveals no gallop and no friction rub.   No murmur heard. Pulmonary/Chest: Effort normal. No tachypnea. No respiratory distress. She has no decreased breath sounds. She has no wheezes. She has rhonchi. She has no rales.  Diffuse rhonchi   Neurological: She is alert.  Skin: Skin is warm, dry and intact. No rash noted.  Psychiatric: Her speech is normal and behavior is normal. Judgment normal. Her mood appears not anxious. Cognition and memory are normal. She does not exhibit a depressed mood.          Assessment & Plan:

## 2017-04-02 NOTE — Patient Instructions (Signed)
Complete antibiotics.  Use robitussin and benzonatate for cough.  Push fluids and rest.  if not improving in 48-72 hours call.

## 2017-04-02 NOTE — Assessment & Plan Note (Signed)
Initially likely viral but now with new fever late in illness, rhonchi on exam , former smoker and pt age... Will treat with course of antibiotics as well as symptomatic care.

## 2017-04-07 ENCOUNTER — Ambulatory Visit: Payer: Self-pay | Admitting: *Deleted

## 2017-04-07 NOTE — Telephone Encounter (Signed)
Patient has had chronic chest congestion for her 2 appointments- starting 10/15 and more recent 5 days ago. She was given antibiotic and is no better- patient got a little better- but her symptoms are still present. Patient is coughing as well.  Reason for Disposition . [1] Fever > 101 F (38.3 C) AND [2] age > 40  Answer Assessment - Initial Assessment Questions 1. ONSET: "When did the cough begin?"      Cough has been present from beginning 2. SEVERITY: "How bad is the cough today?"      Patient coughs when she takes deep breaths or talks 3. RESPIRATORY DISTRESS: "Describe your breathing."      Not labored 4. FEVER: "Do you have a fever?" If so, ask: "What is your temperature, how was it measured, and when did it start?"     Last night 101.6 this morning no fever- it has been off and on 5. SPUTUM: "Describe the color of your sputum" (clear, white, yellow, green)     yellowish 6. HEMOPTYSIS: "Are you coughing up any blood?" If so ask: "How much?" (flecks, streaks, tablespoons, etc.)     unknown 7. CARDIAC HISTORY: "Do you have any history of heart disease?" (e.g., heart attack, congestive heart failure)      no 8. LUNG HISTORY: "Do you have any history of lung disease?"  (e.g., pulmonary embolus, asthma, emphysema)     no 9. PE RISK FACTORS: "Do you have a history of blood clots?" (or: recent major surgery, recent prolonged travel, bedridden )     no 10. OTHER SYMPTOMS: "Do you have any other symptoms?" (e.g., runny nose, wheezing, chest pain)       Wheezing when she breaths 11. PREGNANCY: "Is there any chance you are pregnant?" "When was your last menstrual period?"       n/a 12. TRAVEL: "Have you traveled out of the country in the last month?" (e.g., travel history, exposures)       no  Protocols used: [place]

## 2017-04-08 ENCOUNTER — Ambulatory Visit (INDEPENDENT_AMBULATORY_CARE_PROVIDER_SITE_OTHER): Payer: Medicare Other | Admitting: Family Medicine

## 2017-04-08 ENCOUNTER — Encounter: Payer: Self-pay | Admitting: Family Medicine

## 2017-04-08 VITALS — BP 104/62 | HR 107 | Temp 97.7°F | Ht 65.0 in | Wt 164.2 lb

## 2017-04-08 DIAGNOSIS — J189 Pneumonia, unspecified organism: Secondary | ICD-10-CM

## 2017-04-08 DIAGNOSIS — R062 Wheezing: Secondary | ICD-10-CM

## 2017-04-08 MED ORDER — LEVOFLOXACIN 500 MG PO TABS: 500.0000 mg | ORAL_TABLET | Freq: Every day | ORAL | 0 refills | Status: DC

## 2017-04-08 MED ORDER — PREDNISONE 20 MG PO TABS: 20.0000 mg | ORAL_TABLET | Freq: Every day | ORAL | 0 refills | Status: DC

## 2017-04-08 NOTE — Progress Notes (Signed)
Dr. Frederico Hamman T. Nira Visscher, MD, [facility] [place], [address] Phone: [phone] Fax: [phone]  04/08/2017  Patient: Wendy Murray, MRN: [id number], DOB: April 11, 1936, 81 y.o.  Primary Physician:  Venia Carbon, MD   Chief Complaint  Patient presents with  . Cough    seen Dr. Diona Browner 10/18-Not any better on antibiotics  . Back Pain  . Neck Pain  . Fever   Subjective:   Wendy Murray is a 81 y.o. very pleasant female patient who presents with the following:  Started on the 13th and just not going away and the cough is awful. A little hard to cough an dfever off and on. 5 or 5 times with a spiking fever. No energy. Some pain in the back and neck. This is actually her third trip to our office for this condition.  She was initially seen on March 30, 2017, she is had persistent symptoms.  She was previously placed on azithromycin by my partner on April 02, 2017.  She continues to have symptoms and a productive cough, some difficulty breathing, and she additionally has had intermittent fevers approaching 101.  Quit smoking 1983.   Past Medical History, Surgical History, Social History, Family History, Problem List, Medications, and Allergies have been reviewed and updated if relevant.  Patient Active Problem List   Diagnosis Date Noted  . Cough 04/02/2017  . URI (upper respiratory infection) 03/31/2017  . GERD (gastroesophageal reflux disease) 12/16/2016  . Diplopia 12/14/2015  . Hyperparathyroidism ([place])   . Chronic daily headache 12/13/2014  . Advance directive discussed with patient 12/13/2014  . Routine general medical examination at a health care facility 12/12/2013  . Hyperlipidemia   . Recurrent major depression ([place])   . Hypothyroidism   . Osteoarthritis, knee   . IBS (irritable bowel syndrome)     Past Medical History:  Diagnosis Date  . Diverticulosis   . DVT (deep venous  thrombosis) ([place]) 2012   after TKR  . Hyperlipidemia   . Hyperparathyroidism ([place])   . Hypothyroidism   . IBS (irritable bowel syndrome)   . Inflammatory arthritis   . Osteoarthritis, knee   . Recurrent major depression ([place])     Past Surgical History:  Procedure Laterality Date  . ABDOMINAL HYSTERECTOMY  1991  . CATARACT EXTRACTION W/ INTRAOCULAR LENS  IMPLANT, BILATERAL  2014  . CHOLECYSTECTOMY  1998  . DEXA  3/09   Normal  . HAMMER TOE SURGERY  5/16   Dr Barkley Bruns  . LUMBAR FUSION  1993  . MENISCUS REPAIR Left 11/11  . TOTAL KNEE ARTHROPLASTY Left 2012    Social History   Social History  . Marital status: Married    Spouse name: N/A  . Number of children: 3  . Years of education: N/A   Occupational History  . 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 Smoking status: Former Smoker    Quit date: 06/16/1981  . Smokeless tobacco: Never Used     Comment: quit 1983  . Alcohol use 0.0 oz/week     Comment: rarely  . Drug use: No  . Sexual activity: No   Other Topics Concern  . Not on file   Social History Narrative   [place]      Has living will    Husband, then daughter Joycelyn Schmid, have health care POA  Has DNR ---order rewritten   No feeding tube if cognitively unaware    Family History  Problem Relation Age of Onset  . Cancer Mother        colon  . Depression Mother   . Cancer Father   . Depression Sister   . Hypertension Neg Hx   . Heart disease Neg Hx   . Diabetes Neg Hx     No Known Allergies  Medication list reviewed and updated in full in [place].  ROS: GEN: Acute illness details above GI: Tolerating PO intake GU: maintaining adequate hydration and urination Pulm: No SOB Interactive and getting along well at home.  Otherwise, ROS is as per the HPI.  Objective:   BP 104/62   Pulse (!) 107   Temp 97.7 F (36.5 C) (Oral)   Ht 5\' 5"  (1.651 m)   Wt  164 lb 4 oz (74.5 kg)   SpO2 96%   BMI 27.33 kg/m    GEN: A and O x 3. WDWN. NAD.    ENT: Nose clear, ext NML.  No LAD.  No JVD.  TM's clear. Oropharynx clear.  PULM: Normal WOB, no distress. Scattered rhonchorous sounds without crackles.  Rare wheezing bilaterally. CV: RRR, no M/G/R, No rubs, No JVD.   EXT: warm and well-perfused, No c/c/e. PSYCH: Pleasant and conversant.    Laboratory and Imaging Data:  Assessment and Plan:   Walking pneumonia  Wheezing  Failure to improve with initial antibiotics, 3 office visits for same symptoms, pulmonary symptoms active now.  Treat with Levaquin as well as a low-dose of some prednisone which will hopefully help with her wheezing.  Follow-up: No Follow-up on file.  Future Appointments Date Time Provider [place]  12/22/2017 10:30 AM Venia Carbon, MD [facility]    Meds ordered this encounter  Medications  . levofloxacin (LEVAQUIN) 500 MG tablet    Sig: Take 1 tablet (500 mg total) by mouth daily.    Dispense:  10 tablet    Refill:  0  . predniSONE (DELTASONE) 20 MG tablet    Sig: Take 1 tablet (20 mg total) by mouth daily with breakfast.    Dispense:  5 tablet    Refill:  0   Medications Discontinued During This Encounter  Medication Reason  . azithromycin (ZITHROMAX) 250 MG tablet Completed Course   No orders of the defined types were placed in this encounter.   Signed,  Maud Deed. Jahmeir Geisen, MD   Allergies as of 04/08/2017   No Known Allergies     Medication List       Accurate as of 04/08/17 11:59 PM. Always use your most recent med list.          ALIGN 4 MG Caps Take 1 capsule by mouth daily.   benzonatate 200 MG capsule Commonly known as:  TESSALON Take 1 capsule (200 mg total) by mouth 3 (three) times daily as needed.   clonazePAM 1 MG tablet Commonly known as:  KLONOPIN Take 1 mg by mouth at bedtime.   EXCEDRIN EXTRA STRENGTH 250-250-65 MG tablet Generic drug:   aspirin-acetaminophen-caffeine Take 1 tablet by mouth every 6 (six) hours as needed for headache.   levofloxacin 500 MG tablet Commonly known as:  LEVAQUIN Take 1 tablet (500 mg total) by mouth daily.   levothyroxine 75 MCG tablet Commonly known as:  SYNTHROID, LEVOTHROID TAKE 1 TABLET (75 MCG TOTAL) BY MOUTH DAILY BEFORE BREAKFAST.   mirtazapine 15 MG tablet Commonly  known as:  REMERON Take 15 mg by mouth at bedtime.   multivitamin with minerals Tabs tablet Take 1 tablet by mouth daily.   predniSONE 20 MG tablet Commonly known as:  DELTASONE Take 1 tablet (20 mg total) by mouth daily with breakfast.   venlafaxine XR 75 MG 24 hr capsule Commonly known as:  EFFEXOR-XR Take 75 mg by mouth daily. Pt takes with a 150mg  capsule.   venlafaxine XR 150 MG 24 hr capsule Commonly known as:  EFFEXOR-XR Take 150 mg by mouth daily. Pt takes with a 75mg  capsule.   Vitamin D3 5000 units Caps Take 1 capsule by mouth.

## 2017-04-18 DIAGNOSIS — Z23 Encounter for immunization: Secondary | ICD-10-CM | POA: Diagnosis not present

## 2017-09-02 ENCOUNTER — Encounter: Payer: Self-pay | Admitting: Internal Medicine

## 2017-11-13 ENCOUNTER — Other Ambulatory Visit: Payer: Self-pay | Admitting: Internal Medicine

## 2017-11-17 ENCOUNTER — Encounter: Payer: Self-pay | Admitting: Internal Medicine

## 2017-11-17 DIAGNOSIS — K920 Hematemesis: Secondary | ICD-10-CM

## 2017-12-22 ENCOUNTER — Encounter: Payer: Self-pay | Admitting: Internal Medicine

## 2017-12-22 ENCOUNTER — Ambulatory Visit (INDEPENDENT_AMBULATORY_CARE_PROVIDER_SITE_OTHER): Payer: Medicare Other | Admitting: Internal Medicine

## 2017-12-22 VITALS — BP 128/74 | HR 90 | Temp 98.0°F | Ht 65.0 in | Wt 171.0 lb

## 2017-12-22 DIAGNOSIS — K219 Gastro-esophageal reflux disease without esophagitis: Secondary | ICD-10-CM

## 2017-12-22 DIAGNOSIS — Z7189 Other specified counseling: Secondary | ICD-10-CM

## 2017-12-22 DIAGNOSIS — E039 Hypothyroidism, unspecified: Secondary | ICD-10-CM | POA: Diagnosis not present

## 2017-12-22 DIAGNOSIS — E213 Hyperparathyroidism, unspecified: Secondary | ICD-10-CM

## 2017-12-22 DIAGNOSIS — Z0000 Encounter for general adult medical examination without abnormal findings: Secondary | ICD-10-CM | POA: Diagnosis not present

## 2017-12-22 DIAGNOSIS — F3340 Major depressive disorder, recurrent, in remission, unspecified: Secondary | ICD-10-CM

## 2017-12-22 LAB — COMPREHENSIVE METABOLIC PANEL
ALT: 23 U/L (ref 0–35)
AST: 18 U/L (ref 0–37)
Albumin: 4.3 g/dL (ref 3.5–5.2)
Alkaline Phosphatase: 85 U/L (ref 39–117)
BILIRUBIN TOTAL: 0.5 mg/dL (ref 0.2–1.2)
BUN: 17 mg/dL (ref 6–23)
CALCIUM: 10.4 mg/dL (ref 8.4–10.5)
CHLORIDE: 103 meq/L (ref 96–112)
CO2: 30 meq/L (ref 19–32)
Creatinine, Ser: 0.84 mg/dL (ref 0.40–1.20)
GFR: 69.26 mL/min (ref >60.00)
GLUCOSE: 112 mg/dL — AB (ref 70–99)
POTASSIUM: 4.6 meq/L (ref 3.5–5.1)
Sodium: 139 mEq/L (ref 135–145)
Total Protein: 7.1 g/dL (ref 6.0–8.3)

## 2017-12-22 LAB — CBC
HCT: 40 % (ref 36.0–46.0)
HEMOGLOBIN: 13.2 g/dL (ref 12.0–15.0)
MCHC: 33 g/dL (ref 30.0–36.0)
MCV: 84.9 fl (ref 78.0–100.0)
Platelets: 379 10*3/uL (ref 150.0–400.0)
RBC: 4.7 Mil/uL (ref 3.87–5.11)
RDW: 14.6 % (ref 11.5–15.5)
WBC: 6.3 10*3/uL (ref 4.0–10.5)

## 2017-12-22 LAB — TSH: TSH: 2.95 u[IU]/mL (ref 0.35–4.50)

## 2017-12-22 LAB — T4, FREE: Free T4: 0.87 ng/dL (ref 0.60–1.60)

## 2017-12-22 NOTE — Assessment & Plan Note (Signed)
Seems euthryoid Due for labs

## 2017-12-22 NOTE — Assessment & Plan Note (Signed)
In remission on Rx Continues with the psychiatrist

## 2017-12-22 NOTE — Assessment & Plan Note (Signed)
Quiet without Rx Recent hematemesis likely from self limited Mack Guise tear--going to GI

## 2017-12-22 NOTE — Assessment & Plan Note (Signed)
No clear symptoms Will refer to Dr Harlow Asa if labs worse

## 2017-12-22 NOTE — Progress Notes (Signed)
Hearing Screening Comments: Has hearing aids. Not wearing today Vision Screening Comments: July 2018. Upcoming appointment

## 2017-12-22 NOTE — Assessment & Plan Note (Signed)
Has DNR 

## 2017-12-22 NOTE — Progress Notes (Signed)
Subjective:    Patient ID: Wendy Murray, female    DOB: 11-05-1935, 82 y.o.   MRN: [id number]  HPI Here for Medicare wellness visit and follow up of chronic medical conditions Reviewed form and advanced directives Reviewed other doctors No alcohol or tobacco Tries to walk regularly Vision is okay Wears hearing aides--they help Had 1 fall this year---climbed on chair and it tipped over. 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 Then decided not to resign. She had to deal with the fallout of this Depression is still controlled though  Energy is okay No change in hair, etc Has continued on just the levothyroxine  No clear symptoms with the parathyroid DEXA okay Needs recheck of labs  No heartburn symptoms off omeprazole No dysphagia  Current Outpatient Medications on File Prior to Visit  Medication Sig Dispense Refill  . aspirin-acetaminophen-caffeine (EXCEDRIN EXTRA STRENGTH) 250-250-65 MG tablet Take 1 tablet by mouth every 6 (six) hours as needed for headache.    . Cholecalciferol (VITAMIN D3) 5000 units CAPS Take 1 capsule by mouth. Taking 2000 units    . clonazePAM (KLONOPIN) 1 MG tablet Take 1 mg by mouth at bedtime.     Marland Kitchen levothyroxine (SYNTHROID, LEVOTHROID) 75 MCG tablet TAKE 1 TABLET (75 MCG TOTAL) BY MOUTH DAILY BEFORE BREAKFAST. 90 tablet 0  . mirtazapine (REMERON) 15 MG tablet Take 15 mg by mouth at bedtime.     . Multiple Vitamin (MULTIVITAMIN WITH MINERALS) TABS tablet Take 1 tablet by mouth daily.    . Probiotic Product (ALIGN) 4 MG CAPS Take 1 capsule by mouth daily.    Marland Kitchen venlafaxine XR  (EFFEXOR-XR) 150 MG 24 hr capsule Take 150 mg by mouth daily. Pt takes with a 75mg  capsule.    . venlafaxine XR (EFFEXOR-XR) 75 MG 24 hr capsule Take 75 mg by mouth daily. Pt takes with a 150mg  capsule.     No current facility-administered medications on file prior to visit.     No Known Allergies  Past Medical History:  Diagnosis Date  . Diverticulosis   . DVT (deep venous thrombosis) (Wendy Murray) 2012   after TKR  . Hyperlipidemia   . Hyperparathyroidism ([place])   . Hypothyroidism   . IBS (irritable bowel syndrome)   . Inflammatory arthritis   . Osteoarthritis, knee   . Recurrent major depression ([place])     Past Surgical History:  Procedure Laterality Date  . ABDOMINAL HYSTERECTOMY  1991  . CATARACT EXTRACTION W/ INTRAOCULAR LENS  IMPLANT, BILATERAL  2014  . CHOLECYSTECTOMY  1998  . DEXA  3/09   Normal  . HAMMER TOE SURGERY  5/16   Wendy Barkley Bruns  . LUMBAR FUSION  1993  . MENISCUS REPAIR Left 11/11  . TOTAL KNEE ARTHROPLASTY Left 2012    Family History  Problem Relation Age of Onset  . Cancer Mother        colon  . Depression Mother   . Cancer Father   . Depression Sister   . Hypertension Neg Hx   . Heart disease Neg Hx   . Diabetes Neg Hx     Social History   Socioeconomic History  .  Marital status: Married    Spouse name: Not on file  . Number of children: 3  . Years of education: Not on file  . Highest education level: Not on file  Occupational History  . 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 Financial resource strain: Not on file  . Food insecurity:    Worry: Not on file    Inability: Not on file  . Transportation needs:    Medical: Not on file    Non-medical: Not on file  Tobacco Use  . Smoking status: Former Smoker    Last attempt to quit: 06/16/1981    Years since quitting: 36.5  . Smokeless tobacco: Never Used  . Tobacco comment: quit 1983  Substance and Sexual Activity  . Alcohol use: Yes     Alcohol/week: 0.0 oz    Comment: rarely  . Drug use: No  . Sexual activity: Never  Lifestyle  . Physical activity:    Days per week: Not on file    Minutes per session: Not on file  . Stress: Not on file  Relationships  . Social connections:    Talks on phone: Not on file    Gets together: Not on file    Attends religious service: Not on file    Active member of club or organization: Not on file    Attends meetings of clubs or organizations: Not on file    Relationship status: Not on file  . Intimate partner violence:    Fear of current or ex partner: Not on file    Emotionally abused: Not on file    Physically abused: Not on file    Forced sexual activity: Not on file  Other Topics Concern  . Not on file  Social History Narrative   [place] in [place]      Has living will    Husband, then daughter Joycelyn Schmid, have health care POA   Has DNR ---order rewritten   No feeding tube if cognitively unaware   Review of Systems Appetite is good Weight is stable Sleeps fairly well--with the clonazepam Wears seat belt Teeth okay---- keeps up with dentist No skin problems--no dermatologist No chest pain or SOB No dizziness or syncope Bowels are better now. No blood No urinary issues No sig back or joint pain---right thumb does click and occasional pain Concerned about husband--having some memory issues Only occasional headaches    Objective:   Physical Exam  Constitutional: She is oriented to person, place, and time. She appears well-developed. No distress.  HENT:  Mouth/Throat: Oropharynx is clear and moist. No oropharyngeal exudate.  Neck: No thyromegaly present.  Cardiovascular: Normal rate, regular rhythm, normal heart sounds and intact distal pulses. Exam reveals no gallop.  No murmur heard. Respiratory: Effort normal and breath sounds normal. No respiratory distress. She has no wheezes. She has no rales.  GI: Soft. There is no tenderness.    Musculoskeletal: She exhibits no edema or tenderness.  Lymphadenopathy:    She has no cervical adenopathy.  Neurological: She is alert and oriented to person, place, and time.  President--- "Daisy Floro, Wendy Murray" [id number] Recall 3/3  Skin: No rash noted. No erythema.  Psychiatric: She has a normal mood and affect. Her behavior is normal.           Assessment & Plan:

## 2017-12-22 NOTE — Assessment & Plan Note (Signed)
I have personally reviewed the Medicare Annual Wellness questionnaire and have noted 1. [name] medical and social history 2. Their use of alcohol, tobacco or illicit drugs 3. Their current medications and supplements 4. [name] functional ability including ADL's, fall risks, home safety risks and hearing or visual             impairment. 5. Diet and physical activities 6. Evidence for depression or mood disorders  The patients weight, height, BMI and visual acuity have been recorded in the chart I have made referrals, counseling and provided education to the patient based review of the above and I have provided the pt with a written personalized care plan for preventive services.  I have provided you with a copy of your personalized plan for preventive services. Please take the time to review along with your updated medication list.  Yearly flu vaccine Done with cancer screening May need GI testing--going soon Will consider shingrix

## 2017-12-23 LAB — PARATHYROID HORMONE, INTACT (NO CA): PTH: 154 pg/mL — AB (ref 14–64)

## 2017-12-28 ENCOUNTER — Other Ambulatory Visit: Payer: Self-pay

## 2017-12-28 ENCOUNTER — Ambulatory Visit (INDEPENDENT_AMBULATORY_CARE_PROVIDER_SITE_OTHER): Payer: Medicare Other | Admitting: Gastroenterology

## 2017-12-28 ENCOUNTER — Encounter: Payer: Self-pay | Admitting: Gastroenterology

## 2017-12-28 VITALS — BP 146/89 | HR 115 | Ht 65.0 in | Wt 172.5 lb

## 2017-12-28 DIAGNOSIS — Z8719 Personal history of other diseases of the digestive system: Secondary | ICD-10-CM | POA: Diagnosis not present

## 2017-12-28 NOTE — Patient Instructions (Signed)
You are scheduled for a upper GI series at [facility] on Friday, July 19th at 8:00am. Please arrive at the medical mall registration desk at 7:45am. You cannot have anything to eat or drink after midnight.   If you need to reschedule this appointment for any reason, please contact central scheduling at [phone].

## 2017-12-28 NOTE — Progress Notes (Signed)
Gastroenterology Consultation  Referring Provider:     Venia Carbon, MD Primary Care Physician:  Venia Carbon, MD Primary Gastroenterologist:  Dr. Allen Norris     Reason for Consultation:     Hematemesis        HPI:   Wendy Murray is a 82 y.o. y/o female referred for consultation & management of hematemesis by Dr. Silvio Pate, Theophilus Kinds, MD.  This patient comes in today as a new patient to this practice.  The patient has been seen in [facility] by Dr. Henrene Pastor for irritable bowel syndrome constipation predominant and was treated with MiraLAX.  Approximately 9 years ago the patient had a colonoscopy in [place] and was recommended to have a repeat colonoscopy in 3 years.  At that time the patient states that she had an incomplete colonoscopy with a follow-up barium enema that was reported to be normal.  The patient reports that she was spitting up blood after having an episode in church that initiated her seeking GI evaluation.  The patient has not had any more episodes of nausea vomiting and denies any further hematemesis.  The patient does report that she does have episodes of diarrhea coming with nausea and vomiting when her closer to tight and then it spontaneously resolves.  Past Medical History:  Diagnosis Date  . Diverticulosis   . DVT (deep venous thrombosis) ([place]) 2012   after TKR  . Hyperlipidemia   . Hyperparathyroidism ([place])   . Hypothyroidism   . IBS (irritable bowel syndrome)   . Inflammatory arthritis   . Osteoarthritis, knee   . Recurrent major depression ([place])     Past Surgical History:  Procedure Laterality Date  . ABDOMINAL HYSTERECTOMY  1991  . CATARACT EXTRACTION W/ INTRAOCULAR LENS  IMPLANT, BILATERAL  2014  . CHOLECYSTECTOMY  1998  . DEXA  3/09   Normal  . HAMMER TOE SURGERY  5/16   Dr Barkley Bruns  . LUMBAR FUSION  1993  . MENISCUS REPAIR Left 11/11  . TOTAL KNEE ARTHROPLASTY Left 2012    Prior to Admission medications   Medication Sig Start Date End  Date Taking? Authorizing Provider  aspirin-acetaminophen-caffeine (EXCEDRIN EXTRA STRENGTH) [phone] MG tablet Take 1 tablet by mouth every 6 (six) hours as needed for headache.    [provider]  Cholecalciferol (VITAMIN D3) 5000 units CAPS Take 1 capsule by mouth. Taking 2000 units    [provider]  clonazePAM (KLONOPIN) 1 MG tablet Take 1 mg by mouth at bedtime.     [provider]  levothyroxine (SYNTHROID, LEVOTHROID) 75 MCG tablet TAKE 1 TABLET (75 MCG TOTAL) BY MOUTH DAILY BEFORE BREAKFAST. 11/16/17   [name] I, MD  mirtazapine (REMERON) 15 MG tablet Take 15 mg by mouth at bedtime.     [provider]  Multiple Vitamin (MULTIVITAMIN WITH MINERALS) TABS tablet Take 1 tablet by mouth daily.    [provider]  Probiotic Product (ALIGN) 4 MG CAPS Take 1 capsule by mouth daily.    [provider]  venlafaxine XR (EFFEXOR-XR) 150 MG 24 hr capsule Take 150 mg by mouth daily. Pt takes with a 75mg  capsule.    [provider]  venlafaxine XR (EFFEXOR-XR) 75 MG 24 hr capsule Take 75 mg by mouth daily. Pt takes with a 150mg  capsule.    [provider]    Family History  Problem Relation Age of Onset  . Cancer Mother        colon  .  Depression Mother   . Cancer Father   . Depression Sister   . Hypertension Neg Hx   . Heart disease Neg Hx   . Diabetes Neg Hx      Social History   Tobacco Use  . Smoking status: Former Smoker    Last attempt to quit: 06/16/1981    Years since quitting: 36.5  . Smokeless tobacco: Never Used  . Tobacco comment: quit 1983  Substance Use Topics  . Alcohol use: Yes    Alcohol/week: 0.0 oz    Comment: rarely  . Drug use: No    Allergies as of 12/28/2017  . (No Known Allergies)    Review of Systems:    All systems reviewed and negative except where noted in HPI.   Physical Exam:  There were no vitals taken for this visit. No LMP recorded. Patient has had a  hysterectomy. General:   Alert,  Well-developed, well-nourished, pleasant and cooperative in NAD Head:  Normocephalic and atraumatic. Eyes:  Sclera clear, no icterus.   Conjunctiva pink. Ears:  Normal auditory acuity. Nose:  No deformity, discharge, or lesions. Mouth:  No deformity or lesions,oropharynx pink & moist. Neck:  Supple; no masses or thyromegaly. Lungs:  Respirations even and unlabored.  Clear throughout to auscultation.   No wheezes, crackles, or rhonchi. No acute distress. Heart:  Regular rate and rhythm; no murmurs, clicks, rubs, or gallops. Abdomen:  Normal bowel sounds.  No bruits.  Soft, non-tender and non-distended without masses, hepatosplenomegaly or hernias noted.  No guarding or rebound tenderness.  Negative Carnett sign.   Rectal:  Deferred.  Msk:  Symmetrical without gross deformities.  Good, equal movement & strength bilaterally. Pulses:  Normal pulses noted. Extremities:  No clubbing or edema.  No cyanosis. Neurologic:  Alert and oriented x3;  grossly normal neurologically. Skin:  Intact without significant lesions or rashes.  No jaundice. Lymph Nodes:  No significant cervical adenopathy. Psych:  Alert and cooperative. Normal mood and affect.  Imaging Studies: No results found.  Assessment and Plan:   Wendy Murray is a 82 y.o. y/o female with a history of constipation dominant irritable bowel syndrome who states that she is doing well on MiraLAX.  The patient has a history of hematemesis after vomiting.  The patient has not had any further episodes except that one time.  The patient will be set up for an upper GI series to look for any pathology that may require a EGD.  The patient has been explained the plan and agrees with it.  Lucilla Lame, MD. Marval Regal    Note: This dictation was prepared with Dragon dictation along with smaller phrase technology. Any transcriptional errors that result from this process are unintentional.

## 2018-01-01 ENCOUNTER — Ambulatory Visit
Admission: RE | Admit: 2018-01-01 | Discharge: 2018-01-01 | Disposition: A | Discharge status: Home / Self Care | Payer: Medicare Other | Source: Ambulatory Visit | Attending: Gastroenterology | Admitting: Gastroenterology

## 2018-01-01 DIAGNOSIS — K449 Diaphragmatic hernia without obstruction or gangrene: Secondary | ICD-10-CM | POA: Diagnosis not present

## 2018-01-01 DIAGNOSIS — Z8719 Personal history of other diseases of the digestive system: Secondary | ICD-10-CM | POA: Diagnosis not present

## 2018-01-01 DIAGNOSIS — K219 Gastro-esophageal reflux disease without esophagitis: Secondary | ICD-10-CM | POA: Diagnosis not present

## 2018-01-03 ENCOUNTER — Encounter: Payer: Self-pay | Admitting: Internal Medicine

## 2018-01-11 ENCOUNTER — Telehealth: Payer: Self-pay

## 2018-01-11 NOTE — Telephone Encounter (Signed)
-----   Message from Lucilla Lame, MD sent at 01/08/2018  9:41 PM EDT ----- That the patient know that she has a small hiatal hernia with a moderate amount of reflux and some contractions in her esophagus that may be consistent with a week esophagus versus spasms of the esophagus.  The patient should be on a PPI if she is not on one already.

## 2018-01-11 NOTE — Telephone Encounter (Signed)
Pt notified of results. She will restart her daily PPI that she had stopped.

## 2018-02-11 ENCOUNTER — Other Ambulatory Visit: Payer: Self-pay | Admitting: Internal Medicine

## 2018-03-12 DIAGNOSIS — H43813 Vitreous degeneration, bilateral: Secondary | ICD-10-CM | POA: Diagnosis not present

## 2018-04-09 DIAGNOSIS — Z23 Encounter for immunization: Secondary | ICD-10-CM | POA: Diagnosis not present

## 2018-07-13 DIAGNOSIS — H903 Sensorineural hearing loss, bilateral: Secondary | ICD-10-CM | POA: Insufficient documentation

## 2018-07-13 HISTORY — DX: Sensorineural hearing loss, bilateral: H90.3

## 2018-12-29 ENCOUNTER — Encounter: Payer: Self-pay | Admitting: Internal Medicine

## 2018-12-29 ENCOUNTER — Ambulatory Visit (INDEPENDENT_AMBULATORY_CARE_PROVIDER_SITE_OTHER): Payer: Medicare Other | Admitting: Internal Medicine

## 2018-12-29 ENCOUNTER — Other Ambulatory Visit: Payer: Self-pay

## 2018-12-29 VITALS — BP 128/76 | HR 95 | Temp 98.4°F | Ht 65.0 in | Wt 173.0 lb

## 2018-12-29 DIAGNOSIS — E039 Hypothyroidism, unspecified: Secondary | ICD-10-CM | POA: Diagnosis not present

## 2018-12-29 DIAGNOSIS — E213 Hyperparathyroidism, unspecified: Secondary | ICD-10-CM | POA: Diagnosis not present

## 2018-12-29 DIAGNOSIS — F330 Major depressive disorder, recurrent, mild: Secondary | ICD-10-CM | POA: Diagnosis not present

## 2018-12-29 DIAGNOSIS — Z0000 Encounter for general adult medical examination without abnormal findings: Secondary | ICD-10-CM | POA: Diagnosis not present

## 2018-12-29 DIAGNOSIS — K580 Irritable bowel syndrome with diarrhea: Secondary | ICD-10-CM | POA: Diagnosis not present

## 2018-12-29 DIAGNOSIS — K219 Gastro-esophageal reflux disease without esophagitis: Secondary | ICD-10-CM | POA: Diagnosis not present

## 2018-12-29 DIAGNOSIS — Z7189 Other specified counseling: Secondary | ICD-10-CM

## 2018-12-29 LAB — COMPREHENSIVE METABOLIC PANEL WITH GFR
ALT: 22 U/L (ref 0–35)
AST: 17 U/L (ref 0–37)
Albumin: 4.5 g/dL (ref 3.5–5.2)
Alkaline Phosphatase: 98 U/L (ref 39–117)
BUN: 14 mg/dL (ref 6–23)
CO2: 32 meq/L (ref 19–32)
Calcium: 11.3 mg/dL — ABNORMAL HIGH (ref 8.4–10.5)
Chloride: 102 meq/L (ref 96–112)
Creatinine, Ser: 0.93 mg/dL (ref 0.40–1.20)
GFR: 57.8 mL/min — ABNORMAL LOW
Glucose, Bld: 88 mg/dL (ref 70–99)
Potassium: 5.1 meq/L (ref 3.5–5.1)
Sodium: 139 meq/L (ref 135–145)
Total Bilirubin: 0.4 mg/dL (ref 0.2–1.2)
Total Protein: 6.9 g/dL (ref 6.0–8.3)

## 2018-12-29 LAB — CBC
HCT: 42.4 % (ref 36.0–46.0)
Hemoglobin: 13.7 g/dL (ref 12.0–15.0)
MCHC: 32.3 g/dL (ref 30.0–36.0)
MCV: 86.6 fl (ref 78.0–100.0)
Platelets: 379 10*3/uL (ref 150.0–400.0)
RBC: 4.89 Mil/uL (ref 3.87–5.11)
RDW: 14.6 % (ref 11.5–15.5)
WBC: 7.4 10*3/uL (ref 4.0–10.5)

## 2018-12-29 LAB — T4, FREE: Free T4: 0.7 ng/dL (ref 0.60–1.60)

## 2018-12-29 LAB — TSH: TSH: 5.07 u[IU]/mL — ABNORMAL HIGH (ref 0.35–4.50)

## 2018-12-29 MED ORDER — LEVOTHYROXINE SODIUM 75 MCG PO TABS
ORAL_TABLET | ORAL | 3 refills | Status: DC
Start: 2018-12-29 — End: 2020-01-31

## 2018-12-29 NOTE — Assessment & Plan Note (Signed)
No symptoms Osteopenia only Will recheck calcium level

## 2018-12-29 NOTE — Progress Notes (Signed)
Subjective:    Patient ID: Wendy Murray, female    DOB: 1936/03/11, 83 y.o.   MRN: [id number]  HPI Here for Medicare wellness and follow up of chronic health conditions Reviewed advanced directives Reviewed other doctors--- Dr Elba Barman ([place])--psychiatrist, Dr Sharee Pimple, Ms Whitson--audiology, Dr Caro Laroche Has hearing aides for crowded settings Vision is okay No procedures or hospital stays in past year No tobacco Rare glass of wine Trying to get back to more exercise No falls Chronic mood problems Independent with instrumental ADLs--shares with husband No apparent memory problems  Had to cancel trip to grandson's wedding in the [place] Dealing with the COVID otherwise Stays in other than the grocery store  Concerned about her husband She sees "mental deterioration" Trouble following plots in movies, etc Gets lost when driving Discussed that in office evaluation--has appt in August  Depression has been controlled Reduced visits with psychiatrist No problems with the medications  No fatigue No muscle pain or weakness Back to walking on indoor track Due for labs again Continues on the thyroid medication   Takes omeprazole Controls heartburn most of the time--has to avoid bending over after eating No dysphagia Still with diarrhea prone IBS Done seeing GI doctor about this  Current Outpatient Medications on File Prior to Visit  Medication Sig Dispense Refill  . aspirin-acetaminophen-caffeine (EXCEDRIN EXTRA STRENGTH) 250-250-65 MG tablet Take 1 tablet by mouth every 6 (six) hours as needed for headache.    . Cholecalciferol (VITAMIN D3) 5000 units CAPS Take 1 capsule by mouth. Taking 2000 units    . clonazePAM (KLONOPIN) 1 MG tablet Take 1 mg by mouth at bedtime.     Marland Kitchen levothyroxine (SYNTHROID, LEVOTHROID) 75 MCG tablet TAKE 1 TABLET (75 MCG TOTAL) BY MOUTH DAILY BEFORE BREAKFAST. 90 tablet 3  . mirtazapine (REMERON) 15 MG tablet Take 15 mg by  mouth at bedtime.     . Multiple Vitamin (MULTIVITAMIN WITH MINERALS) TABS tablet Take 1 tablet by mouth daily.    Marland Kitchen omeprazole (PRILOSEC) 20 MG capsule Take 20 mg by mouth daily.    . Probiotic Product (ALIGN) 4 MG CAPS Take 1 capsule by mouth daily.    Marland Kitchen venlafaxine XR (EFFEXOR-XR) 150 MG 24 hr capsule Take 150 mg by mouth daily. Pt takes with a 75mg  capsule.    . venlafaxine XR (EFFEXOR-XR) 75 MG 24 hr capsule Take 75 mg by mouth daily. Pt takes with a 150mg  capsule.     No current facility-administered medications on file prior to visit.     No Known Allergies  Past Medical History:  Diagnosis Date  . Diverticulosis   . DVT (deep venous thrombosis) ([place]) 2012   after TKR  . Hyperlipidemia   . Hyperparathyroidism ([place])   . Hypothyroidism   . IBS (irritable bowel syndrome)   . Inflammatory arthritis   . Osteoarthritis, knee   . Recurrent major depression ([place])     Past Surgical History:  Procedure Laterality Date  . ABDOMINAL HYSTERECTOMY  1991  . CATARACT EXTRACTION W/ INTRAOCULAR LENS  IMPLANT, BILATERAL  2014  . CHOLECYSTECTOMY  1998  . DEXA  3/09   Normal  . HAMMER TOE SURGERY  5/16   Dr Barkley Bruns  . LUMBAR FUSION  1993  . MENISCUS REPAIR Left 11/11  . TOTAL KNEE ARTHROPLASTY Left 2012    Family History  Problem Relation Age of Onset  . Cancer Mother        colon  . Depression Mother   . Cancer  Father   . Depression Sister   . Hypertension Neg Hx   . Heart disease Neg Hx   . Diabetes Neg Hx     Social History   Socioeconomic History  . Marital status: Married    Spouse name: Not on file  . Number of children: 3  . Years of education: Not on file  . Highest education level: Not on file  Occupational History  . 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 Financial resource strain: Not on file  . Food insecurity    Worry: Not on file    Inability: Not on file  . Transportation needs    Medical: Not on  file    Non-medical: Not on file  Tobacco Use  . Smoking status: Former Smoker    Quit date: 06/16/1981    Years since quitting: 37.5  . Smokeless tobacco: Never Used  . Tobacco comment: quit 1983  Substance and Sexual Activity  . Alcohol use: Yes    Alcohol/week: 0.0 standard drinks    Comment: rarely  . Drug use: No  . Sexual activity: Never  Lifestyle  . Physical activity    Days per week: Not on file    Minutes per session: Not on file  . Stress: Not on file  Relationships  . Social Herbalist on phone: Not on file    Gets together: Not on file    Attends religious service: Not on file    Active member of club or organization: Not on file    Attends meetings of clubs or organizations: Not on file    Relationship status: Not on file  . Intimate partner violence    Fear of current or ex partner: Not on file    Emotionally abused: Not on file    Physically abused: Not on file    Forced sexual activity: Not on file  Other Topics Concern  . Not on file  Social History Narrative   [place]      Has living will    Husband, then daughter Joycelyn Schmid, have health care POA   Has DNR ---order rewritten   No feeding tube if cognitively unaware   Review of Systems Teeth fine---keeps up with dentist Appetite variable Weight stable Sleeps okay--uses the clonazepam Wears seat belt No urinary problems or incontinence No suspicious skin lesions No sig back or joint pains    Objective:   Physical Exam  Constitutional: She is oriented to person, place, and time. She appears well-developed. No distress.  HENT:  Mouth/Throat: Oropharynx is clear and moist. No oropharyngeal exudate.  Neck: No thyromegaly present.  Cardiovascular: Normal rate, regular rhythm, normal heart sounds and intact distal pulses. Exam reveals no gallop.  No murmur heard. Respiratory: Effort normal and breath sounds normal. No respiratory distress. She has no  wheezes. She has no rales.  GI: Soft. There is no abdominal tenderness.  Musculoskeletal:        General: No tenderness or edema.  Lymphadenopathy:    She has no cervical adenopathy.  Neurological: She is alert and oriented to person, place, and time.  President--- "Daisy Floro, Barack Abbe [place] Bush" [id number] D-l-r-o-w Recall 3/3  Skin: No rash noted. No erythema.  Psychiatric: She has a normal mood and affect. Her behavior is normal.           Assessment & Plan:

## 2018-12-29 NOTE — Assessment & Plan Note (Signed)
No changes Not seeing GI anymore

## 2018-12-29 NOTE — Assessment & Plan Note (Signed)
Has DNR 

## 2018-12-29 NOTE — Assessment & Plan Note (Signed)
Doing well with current Rx Sees the psychiatrist

## 2018-12-29 NOTE — Assessment & Plan Note (Signed)
Seems euthyroid ?Will check labs ?

## 2018-12-29 NOTE — Assessment & Plan Note (Signed)
Okay on PPI

## 2018-12-29 NOTE — Assessment & Plan Note (Signed)
I have personally reviewed the Medicare Annual Wellness questionnaire and have noted 1. [name] medical and social history 2. Their use of alcohol, tobacco or illicit drugs 3. Their current medications and supplements 4. [name] functional ability including ADL's, fall risks, home safety risks and hearing or visual             impairment. 5. Diet and physical activities 6. Evidence for depression or mood disorders  The patients weight, height, BMI and visual acuity have been recorded in the chart I have made referrals, counseling and provided education to the patient based review of the above and I have provided the pt with a written personalized care plan for preventive services.  I have provided you with a copy of your personalized plan for preventive services. Please take the time to review along with your updated medication list.  No cancer screening due to age Flu vaccine daily  She will look into the shingrix Discussed exercise

## 2018-12-29 NOTE — Progress Notes (Signed)
Hearing Screening  ° 125Hz 250Hz 500Hz 1000Hz 2000Hz 3000Hz 4000Hz 6000Hz 8000Hz  °Right ear:           °Left ear:           °Comments: Has hearing aids. Not wearing them today. ° °Vision Screening Comments: January 2020 ° ° °

## 2019-02-07 DIAGNOSIS — Z961 Presence of intraocular lens: Secondary | ICD-10-CM | POA: Diagnosis not present

## 2019-03-04 ENCOUNTER — Ambulatory Visit (INDEPENDENT_AMBULATORY_CARE_PROVIDER_SITE_OTHER): Payer: Medicare Other

## 2019-03-04 DIAGNOSIS — Z23 Encounter for immunization: Secondary | ICD-10-CM | POA: Diagnosis not present

## 2019-06-28 DIAGNOSIS — Z23 Encounter for immunization: Secondary | ICD-10-CM | POA: Diagnosis not present

## 2019-07-26 DIAGNOSIS — Z23 Encounter for immunization: Secondary | ICD-10-CM | POA: Diagnosis not present

## 2020-01-02 ENCOUNTER — Encounter: Payer: Medicare Other | Admitting: Internal Medicine

## 2020-01-09 ENCOUNTER — Ambulatory Visit (INDEPENDENT_AMBULATORY_CARE_PROVIDER_SITE_OTHER): Payer: Medicare Other | Admitting: Internal Medicine

## 2020-01-09 ENCOUNTER — Other Ambulatory Visit: Payer: Self-pay

## 2020-01-09 ENCOUNTER — Encounter: Payer: Self-pay | Admitting: Internal Medicine

## 2020-01-09 VITALS — BP 118/64 | HR 77 | Temp 97.7°F | Ht 64.75 in | Wt 172.0 lb

## 2020-01-09 DIAGNOSIS — F3340 Major depressive disorder, recurrent, in remission, unspecified: Secondary | ICD-10-CM | POA: Diagnosis not present

## 2020-01-09 DIAGNOSIS — E213 Hyperparathyroidism, unspecified: Secondary | ICD-10-CM

## 2020-01-09 DIAGNOSIS — E039 Hypothyroidism, unspecified: Secondary | ICD-10-CM | POA: Diagnosis not present

## 2020-01-09 DIAGNOSIS — K580 Irritable bowel syndrome with diarrhea: Secondary | ICD-10-CM | POA: Diagnosis not present

## 2020-01-09 DIAGNOSIS — Z7189 Other specified counseling: Secondary | ICD-10-CM | POA: Diagnosis not present

## 2020-01-09 DIAGNOSIS — Z0000 Encounter for general adult medical examination without abnormal findings: Secondary | ICD-10-CM | POA: Diagnosis not present

## 2020-01-09 MED ORDER — VENLAFAXINE HCL ER 150 MG PO CP24: 150.0000 mg | ORAL_CAPSULE | Freq: Every day | ORAL | 3 refills | Status: DC

## 2020-01-09 MED ORDER — MIRTAZAPINE 15 MG PO TABS: 15.0000 mg | ORAL_TABLET | Freq: Every day | ORAL | 3 refills | Status: DC

## 2020-01-09 MED ORDER — VENLAFAXINE HCL ER 75 MG PO CP24: 75.0000 mg | ORAL_CAPSULE | Freq: Every day | ORAL | 3 refills | Status: DC

## 2020-01-09 NOTE — Assessment & Plan Note (Signed)
Labs consistent with primary hyperparathyroidism Discussed this---if still present---will set up with surgeon Harlow Asa)

## 2020-01-09 NOTE — Assessment & Plan Note (Signed)
Has DNR 

## 2020-01-09 NOTE — Addendum Note (Signed)
Addended by: Pilar Grammes on: 01/09/2020 04:39 PM   Modules accepted: Orders

## 2020-01-09 NOTE — Assessment & Plan Note (Signed)
Doing well on the venlafaxine Clonazepam for sleep I do these prescriptions now

## 2020-01-09 NOTE — Assessment & Plan Note (Signed)
Chronic diarrhea prone Uses the imodium prn for now

## 2020-01-09 NOTE — Assessment & Plan Note (Signed)
Seems to be euthyroid Will check labs on levothyroxine

## 2020-01-09 NOTE — Assessment & Plan Note (Signed)
I have personally reviewed the Medicare Annual Wellness questionnaire and have noted 1. [name] medical and social history 2. Their use of alcohol, tobacco or illicit drugs 3. Their current medications and supplements 4. [name] functional ability including ADL's, fall risks, home safety risks and hearing or visual             impairment. 5. Diet and physical activities 6. Evidence for depression or mood disorders  The patients weight, height, BMI and visual acuity have been recorded in the chart I have made referrals, counseling and provided education to the patient based review of the above and I have provided the pt with a written personalized care plan for preventive services.  I have provided you with a copy of your personalized plan for preventive services. Please take the time to review along with your updated medication list.  No cancer screening due to age Discussed increasing exercise Flu vaccine in the fall

## 2020-01-09 NOTE — Progress Notes (Signed)
Subjective:    Patient ID: Wendy Murray, female    DOB: [birth year], 84 y.o.   MRN: [id number]  HPI Here for Medicare wellness visit and follow up of chronic health conditions This visit occurred during the SARS-CoV-2 public health emergency.  Safety protocols were in place, including screening questions prior to the visit, additional usage of staff PPE, and extensive cleaning of exam room while observing appropriate contact time as indicated for disinfecting solutions.   Reviewed form and advanced directives Reviewed other doctors Rare alcohol No tobacco Very little exercise---discussed Vision is okay Hearing aides Has fallen 2-3 times--- rubber soled shoes will stick on carpets. No injuries Chronic mood issues Independent with instrumental ADLs Mild memory issues--mostly names  Still with digestive issues Related to eating too much or the wrong foods Still diarrhea prone---will also vomit at times also Uses imodium prn with some success  Wonders about vitamin D Discussed that she should not be taking high doses Has known hyperparathyroidism---may need referral to surgeon  Ongoing concern about her husband Chronic depression is controlled on the venlafaxine Has stopped with the psychiatrist--she retired No sig anxiety---uses clonazepam to help sleep  Energy levels okay Thinks thyroid is fine  Ran out of omeprazole Rare heartburn/reflux---like if bends over after a big meal No dysphagia or odynophagia  Current Outpatient Medications on File Prior to Visit  Medication Sig Dispense Refill  . aspirin-acetaminophen-caffeine (EXCEDRIN EXTRA STRENGTH) 250-250-65 MG tablet Take 1 tablet by mouth every 6 (six) hours as needed for headache.    . Cholecalciferol (VITAMIN D3) 5000 units CAPS Take 1 capsule by mouth. Taking 2000 units    . clonazePAM (KLONOPIN) 1 MG tablet Take 1 mg by mouth at bedtime.     Marland Kitchen levothyroxine (SYNTHROID) 75 MCG tablet TAKE 1 TABLET (75 MCG  TOTAL) BY MOUTH DAILY BEFORE BREAKFAST. 90 tablet 3  . mirtazapine (REMERON) 15 MG tablet Take 15 mg by mouth at bedtime.     . Multiple Vitamin (MULTIVITAMIN WITH MINERALS) TABS tablet Take 1 tablet by mouth daily.    Marland Kitchen omeprazole (PRILOSEC) 20 MG capsule Take 20 mg by mouth daily.    . Probiotic Product (ALIGN) 4 MG CAPS Take 1 capsule by mouth daily.    Marland Kitchen venlafaxine XR (EFFEXOR-XR) 150 MG 24 hr capsule Take 150 mg by mouth daily. Pt takes with a 75mg  capsule.    . venlafaxine XR (EFFEXOR-XR) 75 MG 24 hr capsule Take 75 mg by mouth daily. Pt takes with a 150mg  capsule.     No current facility-administered medications on file prior to visit.    No Known Allergies  Past Medical History:  Diagnosis Date  . Diverticulosis   . DVT (deep venous thrombosis) ([place]) 2012   after TKR  . Hyperlipidemia   . Hyperparathyroidism ([place])   . Hypothyroidism   . IBS (irritable bowel syndrome)   . Inflammatory arthritis   . Osteoarthritis, knee   . Recurrent major depression ([place])     Past Surgical History:  Procedure Laterality Date  . ABDOMINAL HYSTERECTOMY  1991  . CATARACT EXTRACTION W/ INTRAOCULAR LENS  IMPLANT, BILATERAL  2014  . CHOLECYSTECTOMY  1998  . DEXA  3/09   Normal  . HAMMER TOE SURGERY  5/16   Dr Barkley Bruns  . LUMBAR FUSION  1993  . MENISCUS REPAIR Left 11/11  . TOTAL KNEE ARTHROPLASTY Left 2012    Family History  Problem Relation Age of Onset  . Cancer Mother  colon  . Depression Mother   . Cancer Father   . Depression Sister   . Hypertension Neg Hx   . Heart disease Neg Hx   . Diabetes Neg Hx     Social History   Socioeconomic History  . Marital status: Married    Spouse name: Not on file  . Number of children: 3  . Years of education: Not on file  . Highest education level: Not on file  Occupational History  . 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 Smoking status: Former Smoker    Quit date: 06/16/1981      Years since quitting: 38.5  . Smokeless tobacco: Never Used  . Tobacco comment: quit 1983  Substance and Sexual Activity  . Alcohol use: Yes    Alcohol/week: 0.0 standard drinks    Comment: rarely  . Drug use: No  . Sexual activity: Never  Other Topics Concern  . Not on file  Social History Narrative   [place] in [place]      Has living will    Husband, then daughter Joycelyn Schmid, have health care POA   Has DNR ---order rewritten   No feeding tube if cognitively unaware   Will be donating body to [place] Determinants of Health   Financial Resource Strain:   . Difficulty of Paying Living Expenses:   Food Insecurity:   . Worried About Charity fundraiser in the Last Year:   . Arboriculturist in the Last Year:   Transportation Needs:   . Film/video editor (Medical):   Marland Kitchen Lack of Transportation (Non-Medical):   Physical Activity:   . Days of Exercise per Week:   . Minutes of Exercise per Session:   Stress:   . Feeling of Stress :   Social Connections:   . Frequency of Communication with Friends and Family:   . Frequency of Social Gatherings with Friends and Family:   . Attends Religious Services:   . Active Member of Clubs or Organizations:   . Attends Archivist Meetings:   Marland Kitchen Marital Status:   Intimate Partner Violence:   . Fear of Current or Ex-Partner:   . Emotionally Abused:   Marland Kitchen Physically Abused:   . Sexually Abused:    Review of Systems Appetite is okay Weight is stable Sleeps okay Wears seat belt Teeth okay--keeps up No skin problems No blood in stools No voiding problems---will have slight urge leakae No sig back or joint pains Dystrophic nail---right great toe. No pain    Objective:   Physical Exam Constitutional:      Appearance: Normal appearance.  HENT:     Head: Normocephalic.     Mouth/Throat:     Mouth: Mucous membranes are moist.     Comments: No lesions Cardiovascular:     Rate  and Rhythm: Normal rate and regular rhythm.     Pulses: Normal pulses.     Heart sounds: No murmur heard.  No gallop.   Pulmonary:     Effort: Pulmonary effort is normal.     Breath sounds: Normal breath sounds. No wheezing or rales.  Abdominal:     Palpations: Abdomen is soft.     Tenderness: There is no abdominal tenderness.  Musculoskeletal:     Cervical back: Neck supple.     Right lower leg: No edema.     Left lower leg: No edema.  Lymphadenopathy:     Cervical: No cervical adenopathy.  Skin:    General: Skin is warm.     Findings: No rash.  Neurological:     General: No focal deficit present.     Mental Status: She is alert and oriented to person, place, and time.     Comments: President--- "Zoila Shutter, Obama" [id number] D-l-r-o-w Recall 3/3  Psychiatric:        Mood and Affect: Mood normal.        Behavior: Behavior normal.            Assessment & Plan:

## 2020-01-09 NOTE — Progress Notes (Signed)
Hearing Screening   125Hz  250Hz  500Hz  1000Hz  2000Hz  3000Hz  4000Hz  6000Hz  8000Hz   Right ear:           Left ear:           Comments: Has hearing aids. Not wearing them today  Vision Screening Comments: Appt August 2021

## 2020-01-10 LAB — RENAL FUNCTION PANEL
Albumin: 4.4 g/dL (ref 3.5–5.2)
BUN: 19 mg/dL (ref 6–23)
CO2: 29 mEq/L (ref 19–32)
Calcium: 11.1 mg/dL — ABNORMAL HIGH (ref 8.4–10.5)
Chloride: 103 mEq/L (ref 96–112)
Creatinine, Ser: 0.97 mg/dL (ref 0.40–1.20)
GFR: 54.91 mL/min — ABNORMAL LOW (ref >60.00)
Glucose, Bld: 99 mg/dL (ref 70–99)
Phosphorus: 3.5 mg/dL (ref 2.3–4.6)
Potassium: 4.9 mEq/L (ref 3.5–5.1)
Sodium: 138 mEq/L (ref 135–145)

## 2020-01-10 LAB — HEPATIC FUNCTION PANEL
ALT: 18 U/L (ref 0–35)
AST: 13 U/L (ref 0–37)
Albumin: 4.4 g/dL (ref 3.5–5.2)
Alkaline Phosphatase: 106 U/L (ref 39–117)
Bilirubin, Direct: 0.1 mg/dL (ref 0.0–0.3)
Total Bilirubin: 0.4 mg/dL (ref 0.2–1.2)
Total Protein: 7 g/dL (ref 6.0–8.3)

## 2020-01-10 LAB — PARATHYROID HORMONE, INTACT (NO CA): PTH: 145 pg/mL — ABNORMAL HIGH (ref 14–64)

## 2020-01-10 LAB — CBC
HCT: 40.9 % (ref 36.0–46.0)
Hemoglobin: 13.4 g/dL (ref 12.0–15.0)
MCHC: 32.8 g/dL (ref 30.0–36.0)
MCV: 85.1 fl (ref 78.0–100.0)
Platelets: 427 10*3/uL — ABNORMAL HIGH (ref 150.0–400.0)
RBC: 4.81 Mil/uL (ref 3.87–5.11)
RDW: 13.7 % (ref 11.5–15.5)
WBC: 8.8 10*3/uL (ref 4.0–10.5)

## 2020-01-10 LAB — VITAMIN D 25 HYDROXY (VIT D DEFICIENCY, FRACTURES): VITD: 28.47 ng/mL — ABNORMAL LOW (ref 30.00–100.00)

## 2020-01-10 LAB — TSH: TSH: 2.52 u[IU]/mL (ref 0.35–4.50)

## 2020-01-10 LAB — T4, FREE: Free T4: 0.74 ng/dL (ref 0.60–1.60)

## 2020-01-11 ENCOUNTER — Other Ambulatory Visit: Payer: Self-pay | Admitting: Internal Medicine

## 2020-01-11 DIAGNOSIS — E210 Primary hyperparathyroidism: Secondary | ICD-10-CM

## 2020-01-31 ENCOUNTER — Other Ambulatory Visit: Payer: Self-pay | Admitting: Internal Medicine

## 2020-03-01 DIAGNOSIS — Z961 Presence of intraocular lens: Secondary | ICD-10-CM | POA: Diagnosis not present

## 2020-04-03 DIAGNOSIS — Z23 Encounter for immunization: Secondary | ICD-10-CM | POA: Diagnosis not present

## 2020-04-12 MED ORDER — CLONAZEPAM 1 MG PO TABS: 1.0000 mg | ORAL_TABLET | Freq: Every day | ORAL | 0 refills | Status: DC

## 2020-04-12 MED ORDER — LEVOTHYROXINE SODIUM 75 MCG PO TABS: ORAL_TABLET | ORAL | 3 refills | Status: DC

## 2020-04-12 MED ORDER — MIRTAZAPINE 15 MG PO TABS: 15.0000 mg | ORAL_TABLET | Freq: Every day | ORAL | 3 refills | Status: DC

## 2020-04-18 DIAGNOSIS — E210 Primary hyperparathyroidism: Secondary | ICD-10-CM | POA: Diagnosis not present

## 2020-04-19 ENCOUNTER — Other Ambulatory Visit: Payer: Self-pay | Admitting: Surgery

## 2020-04-19 DIAGNOSIS — E210 Primary hyperparathyroidism: Secondary | ICD-10-CM

## 2020-04-27 ENCOUNTER — Encounter
Admission: RE | Admit: 2020-04-27 | Discharge: 2020-04-27 | Disposition: A | Discharge status: Home / Self Care | Payer: Medicare Other | Source: Ambulatory Visit | Attending: Surgery | Admitting: Surgery

## 2020-04-27 ENCOUNTER — Ambulatory Visit
Admission: RE | Admit: 2020-04-27 | Discharge: 2020-04-27 | Disposition: A | Discharge status: Home / Self Care | Payer: Medicare Other | Source: Ambulatory Visit | Attending: Surgery | Admitting: Surgery

## 2020-04-27 ENCOUNTER — Other Ambulatory Visit: Payer: Self-pay

## 2020-04-27 DIAGNOSIS — E210 Primary hyperparathyroidism: Secondary | ICD-10-CM | POA: Insufficient documentation

## 2020-04-27 MED ORDER — TECHNETIUM TC 99M SESTAMIBI - CARDIOLITE
24.7400 | Freq: Once | INTRAVENOUS | Status: AC | PRN
  Administered 2020-04-27: 12:00:00 24.74 via INTRAVENOUS

## 2020-04-29 DIAGNOSIS — E210 Primary hyperparathyroidism: Secondary | ICD-10-CM | POA: Diagnosis not present

## 2020-05-01 DIAGNOSIS — Z23 Encounter for immunization: Secondary | ICD-10-CM | POA: Diagnosis not present

## 2020-05-07 ENCOUNTER — Other Ambulatory Visit: Payer: Self-pay | Admitting: Surgery

## 2020-05-07 DIAGNOSIS — E210 Primary hyperparathyroidism: Secondary | ICD-10-CM

## 2020-05-25 ENCOUNTER — Ambulatory Visit
Admission: RE | Admit: 2020-05-25 | Discharge: 2020-05-25 | Disposition: A | Discharge status: Home / Self Care | Payer: Medicare Other | Source: Ambulatory Visit | Attending: Surgery | Admitting: Surgery

## 2020-05-25 ENCOUNTER — Other Ambulatory Visit: Payer: Self-pay

## 2020-05-25 DIAGNOSIS — E210 Primary hyperparathyroidism: Secondary | ICD-10-CM

## 2020-05-25 DIAGNOSIS — E042 Nontoxic multinodular goiter: Secondary | ICD-10-CM | POA: Diagnosis not present

## 2020-05-25 MED ORDER — IOPAMIDOL (ISOVUE-300) INJECTION 61%
75.0000 mL | Freq: Once | INTRAVENOUS | Status: AC | PRN
  Administered 2020-05-25: 75 mL via INTRAVENOUS

## 2020-06-16 DIAGNOSIS — I509 Heart failure, unspecified: Secondary | ICD-10-CM

## 2020-06-16 DIAGNOSIS — J45909 Unspecified asthma, uncomplicated: Secondary | ICD-10-CM

## 2020-06-16 HISTORY — DX: Unspecified asthma, uncomplicated: J45.909

## 2020-06-16 HISTORY — DX: Heart failure, unspecified: I50.9

## 2020-07-09 MED ORDER — LEVOTHYROXINE SODIUM 75 MCG PO TABS: ORAL_TABLET | ORAL | 3 refills | Status: DC

## 2020-07-09 MED ORDER — MIRTAZAPINE 15 MG PO TABS: 15.0000 mg | ORAL_TABLET | Freq: Every day | ORAL | 3 refills | Status: DC

## 2020-07-09 MED ORDER — VENLAFAXINE HCL ER 150 MG PO CP24: 150.0000 mg | ORAL_CAPSULE | Freq: Every day | ORAL | 3 refills | Status: DC

## 2020-07-09 MED ORDER — CLONAZEPAM 1 MG PO TABS: 1.0000 mg | ORAL_TABLET | Freq: Every day | ORAL | 0 refills | Status: DC

## 2020-07-09 MED ORDER — VENLAFAXINE HCL ER 75 MG PO CP24: 75.0000 mg | ORAL_CAPSULE | Freq: Every day | ORAL | 3 refills | Status: DC

## 2020-07-09 NOTE — Telephone Encounter (Signed)
Last filled 04-12-20 #90 Last OV 01-09-20 No Future OV Wendy Murray

## 2020-07-10 MED ORDER — VENLAFAXINE HCL ER 75 MG PO CP24: 75.0000 mg | ORAL_CAPSULE | Freq: Every day | ORAL | 3 refills | Status: DC

## 2020-07-10 MED ORDER — LEVOTHYROXINE SODIUM 75 MCG PO TABS: ORAL_TABLET | ORAL | 3 refills | Status: DC

## 2020-07-10 MED ORDER — VENLAFAXINE HCL ER 150 MG PO CP24: 150.0000 mg | ORAL_CAPSULE | Freq: Every day | ORAL | 3 refills | Status: DC

## 2020-07-10 MED ORDER — MIRTAZAPINE 15 MG PO TABS: 15.0000 mg | ORAL_TABLET | Freq: Every day | ORAL | 3 refills | Status: DC

## 2020-07-10 NOTE — Addendum Note (Signed)
Addended by: Pilar Grammes on: 07/10/2020 07:40 AM   Modules accepted: Orders

## 2020-07-12 ENCOUNTER — Encounter: Payer: Self-pay | Admitting: Nurse Practitioner

## 2020-07-12 ENCOUNTER — Ambulatory Visit: Payer: Medicare Other | Admitting: Nurse Practitioner

## 2020-07-12 ENCOUNTER — Other Ambulatory Visit: Payer: Self-pay

## 2020-07-12 VITALS — BP 150/90 | HR 95 | Temp 97.4°F | Ht 64.0 in | Wt 180.5 lb

## 2020-07-12 DIAGNOSIS — J069 Acute upper respiratory infection, unspecified: Secondary | ICD-10-CM

## 2020-07-12 LAB — POCT INFLUENZA A/B
Influenza A, POC: NEGATIVE
Influenza B, POC: NEGATIVE

## 2020-07-12 NOTE — Progress Notes (Signed)
Careteam: Patient Care Team: Venia Carbon, MD as PCP - General (Internal Medicine)  PLACE OF SERVICE:  [place] Directive information    No Known Allergies  Chief Complaint  Patient presents with  . Acute Visit    Patient complains of cough, diarrhea, sore throat.and body aches. No fever or vomiting. Patient stated that she started feeling bad yesterday morning. She has no had COVID exposure that she knows of. Patient denies loss of taste or smell. She has a decrease in appetite,but drinking plenty of fluids.     HPI: Patient is a 85 y.o. female comes into the [facility] due to not feeling well since yesterday morning and request to be tested for COVID.  Reports she feels very "fluy" body aches and fatigue. No fever. She has been in and out of the hospital with her husband. Noted to have decrease appetite since yesterday. No nausea or vomiting but diarrhea 5 episodes yesterday. None today.  Reports cough, nonproductive and mild Mild chest congestion and nasal congestion.  Reports rhinorrhea.  No shortness of breath or chest pains.   Increase stress with husband in hospital. His memory has been bad and not expecting it to improve. Been busy in and out of the hospital with him. Has recently moved him into healthcare at [facility].   Review of Systems:  Review of Systems  Constitutional: Positive for chills and malaise/fatigue. Negative for diaphoresis and fever.  HENT: Positive for congestion and sore throat. Negative for ear discharge, ear pain, hearing loss, nosebleeds and sinus pain.   Respiratory: Positive for cough.   Cardiovascular: Negative for chest pain.  Gastrointestinal: Positive for diarrhea. Negative for abdominal pain, heartburn, nausea and vomiting.  Neurological: Positive for headaches. Negative for dizziness.    Past Medical History:  Diagnosis Date  . Diverticulosis   . DVT (deep venous thrombosis) ([place]) 2012   after TKR  .  Hyperlipidemia   . Hyperparathyroidism ([place])   . Hypothyroidism   . IBS (irritable bowel syndrome)   . Inflammatory arthritis   . Osteoarthritis, knee   . Recurrent major depression ([place])    Past Surgical History:  Procedure Laterality Date  . ABDOMINAL HYSTERECTOMY  1991  . CATARACT EXTRACTION W/ INTRAOCULAR LENS  IMPLANT, BILATERAL  2014  . CHOLECYSTECTOMY  1998  . DEXA  3/09   Normal  . HAMMER TOE SURGERY  5/16   Dr Barkley Bruns  . LUMBAR FUSION  1993  . MENISCUS REPAIR Left 11/11  . TOTAL KNEE ARTHROPLASTY Left 2012   Social History:   reports that she quit smoking about 39 years ago. She has never used smokeless tobacco. She reports current alcohol use. She reports that she does not use drugs.  Family History  Problem Relation Age of Onset  . Cancer Mother        colon  . Depression Mother   . Cancer Father   . Depression Sister   . Hypertension Neg Hx   . Heart disease Neg Hx   . Diabetes Neg Hx     Medications: Patient's Medications  New Prescriptions   No medications on file  Previous Medications   ASPIRIN-ACETAMINOPHEN-CAFFEINE (EXCEDRIN MIGRAINE) 250-250-65 MG TABLET    Take 1 tablet by mouth every 6 (six) hours as needed for headache.   CLONAZEPAM (KLONOPIN) 1 MG TABLET    Take 1 tablet (1 mg total) by mouth at bedtime.   LEVOTHYROXINE (SYNTHROID) 75 MCG TABLET  TAKE ONE TABLET BY MOUTH DAILY BEFORE BREAKFAST   MIRTAZAPINE (REMERON) 15 MG TABLET    Take 1 tablet (15 mg total) by mouth at bedtime.   MULTIPLE VITAMIN (MULTIVITAMIN WITH MINERALS) TABS TABLET    Take 1 tablet by mouth daily.   OMEPRAZOLE (PRILOSEC) 20 MG CAPSULE    Take 20 mg by mouth daily.   PROBIOTIC PRODUCT (ALIGN) 4 MG CAPS    Take 1 capsule by mouth daily.   VENLAFAXINE XR (EFFEXOR-XR) 150 MG 24 HR CAPSULE    Take 1 capsule (150 mg total) by mouth daily. Pt takes with a 75mg  capsule.   VENLAFAXINE XR (EFFEXOR-XR) 75 MG 24 HR CAPSULE    Take 1 capsule (75 mg total) by mouth daily. Pt takes  with a 150mg  capsule.  Modified Medications   No medications on file  Discontinued Medications   No medications on file    Physical Exam:  Vitals:   07/12/20 1313  BP: (!) 150/90  Pulse: 95  Temp: (!) 97.4 F (36.3 C)  SpO2: 96%  Weight: 180 lb 8 oz (81.9 kg)  Height: 5\' 4"  (1.626 m)   Body mass index is 30.98 kg/m. Wt Readings from Last 3 Encounters:  07/12/20 180 lb 8 oz (81.9 kg)  01/09/20 172 lb (78 kg)  12/29/18 173 lb (78.5 kg)    Physical Exam Constitutional:      General: She is not in acute distress.    Appearance: She is well-developed and well-nourished. She is not diaphoretic.  HENT:     Head: Normocephalic and atraumatic.     Right Ear: Ear canal and external ear normal.     Left Ear: Ear canal and external ear normal.     Nose: Congestion and rhinorrhea present.     Mouth/Throat:     Mouth: Oropharynx is clear and moist. Mucous membranes are dry.     Pharynx: No oropharyngeal exudate.  Eyes:     Conjunctiva/sclera: Conjunctivae normal.     Pupils: Pupils are equal, round, and reactive to light.  Cardiovascular:     Rate and Rhythm: Normal rate and regular rhythm.     Heart sounds: Normal heart sounds.  Pulmonary:     Effort: Pulmonary effort is normal.     Breath sounds: Normal breath sounds.  Abdominal:     General: Bowel sounds are normal.     Palpations: Abdomen is soft.  Musculoskeletal:        General: No tenderness or edema.     Cervical back: Normal range of motion and neck supple.  Skin:    General: Skin is warm and dry.  Neurological:     Mental Status: She is alert and oriented to person, place, and time.  Psychiatric:        Mood and Affect: Mood and affect normal.     Labs reviewed: Basic Metabolic Panel: Recent Labs    01/09/20 1640  NA 138  K 4.9  CL 103  CO2 29  GLUCOSE 99  BUN 19  CREATININE 0.97  CALCIUM 11.1*  PHOS 3.5  TSH 2.52   Liver Function Tests: Recent Labs    01/09/20 1640  AST 13  ALT 18   ALKPHOS 106  BILITOT 0.4  PROT 7.0  ALBUMIN 4.4  4.4   No results for input(s): LIPASE, AMYLASE in the last 8760 hours. No results for input(s): AMMONIA in the last 8760 hours. CBC: Recent Labs    01/09/20 1640  WBC 8.8  HGB 13.4  HCT 40.9  MCV 85.1  PLT 427.0*   Lipid Panel: No results for input(s): CHOL, HDL, LDLCALC, TRIG, CHOLHDL, LDLDIRECT in the last 8760 hours. TSH: Recent Labs    01/09/20 1640  TSH 2.52   A1C: Lab Results  Component Value Date   HGBA1C 6.1 (H) 06/10/2015     Assessment/Plan 1. Upper respiratory tract infection, unspecified type -COVID rapid test done at [facility] which was negative.  -supportive care recommended at this time - POC Influenza A/B- negative.  Nasal wash twice daily Plain nasal saline spray throughout the day as needed May use tylenol 325 mg 2 tablets every 6 hours as needed aches and pains or sore throat humidifier in the home to help with the dry air Mucinex DM by mouth twice daily as needed for cough and congestion with full glass of water  Avoid forcefully blowing nose -make sure you are staying well hydrated -recommended to take Vit C 1000 mg twice daily, Vit D 2000 units daily, zinc 50 mg daily for 10 days -do not sit in bed all day, sit up in chair and walk around as tolerated -education provided on when to follow up and when to seek immediate medication attention through the ED.  To isolate for 5 days- after 5 days if symptoms have resolved you can be out on day 6 with a mask for 5 additional days.   2. Diarrhea -no episodes today Bland diet, advance as tolerate  -encouraged 8oz of electrolyte drink per episode.   Janene Harvey. Biagio Borg  [facility] [phone]

## 2020-07-12 NOTE — Patient Instructions (Signed)
Nasal wash twice daily Plain nasal saline spray throughout the day as needed May use tylenol 325 mg 2 tablets every 6 hours as needed aches and pains or sore throat humidifier in the home to help with the dry air Mucinex DM by mouth twice daily as needed for cough and congestion with full glass of water  Keep well hydrated Avoid forcefully blowing nose -make sure you are staying well hydrated -recommended to take Vit C 1000 mg twice daily, Vit D 2000 units daily, zinc 50 mg daily for 10 days -do not sit in bed all day, sit up in chair and walk around as tolerated -education provided on when to follow up and when to seek immediate medication attention through the ED.   Bland diet, advance as tolerate    To isolate for 5 days- after 5 days if symptoms have resolved you can be out on day 6 with a mask.

## 2020-07-13 ENCOUNTER — Telehealth: Payer: Self-pay

## 2020-07-13 ENCOUNTER — Telehealth (INDEPENDENT_AMBULATORY_CARE_PROVIDER_SITE_OTHER): Payer: Medicare Other | Admitting: Adult Health

## 2020-07-13 ENCOUNTER — Encounter: Payer: Self-pay | Admitting: Adult Health

## 2020-07-13 ENCOUNTER — Other Ambulatory Visit: Payer: Self-pay

## 2020-07-13 DIAGNOSIS — M26609 Unspecified temporomandibular joint disorder, unspecified side: Secondary | ICD-10-CM | POA: Diagnosis not present

## 2020-07-13 MED ORDER — PREDNISONE 10 MG (21) PO TBPK: ORAL_TABLET | ORAL | 0 refills | Status: DC

## 2020-07-13 NOTE — Telephone Encounter (Signed)
[name], eber are scheduled for a virtual visit with your provider today.    Just as we do with appointments in the office, we must obtain your consent to participate.  Your consent will be active for this visit and any virtual visit you may have with one of our providers in the next 365 days.    If you have a MyChart account, I can also send a copy of this consent to you electronically.  All virtual visits are billed to your insurance company just like a traditional visit in the office.  As this is a virtual visit, video technology does not allow for your provider to perform a traditional examination.  This may limit your provider's ability to fully assess your condition.  If your provider identifies any concerns that need to be evaluated in person or the need to arrange testing such as labs, EKG, etc, we will make arrangements to do so.    Although advances in technology are sophisticated, we cannot ensure that it will always work on either your end or our end.  If the connection with a video visit is poor, we may have to switch to a telephone visit.  With either a video or telephone visit, we are not always able to ensure that we have a secure connection.   I need to obtain your verbal consent now.   Are you willing to proceed with your visit today?   Wendy Murray has provided verbal consent on 07/13/2020 for a virtual visit (video or telephone).   Oralia Manis, CMA 07/13/2020  1:15 PM

## 2020-07-13 NOTE — Progress Notes (Signed)
This service is provided via telemedicine  No vital signs collected/recorded due to the encounter was a telemedicine visit.   Location of patient (ex: home, work):  Home  Patient consents to a telephone visit:  Yes see note for 07/13/2020  Location of the provider (ex: office, home): [facility]  Name of any referring provider: N/A  Names of all persons participating in the telemedicine service and their role in the encounter: Marisa Cyphers RMA, Genia Plants, Patient   Time spent on call: 10 min      DATE:  07/13/2020 MRN:  [id number]  BIRTHDAY: [birth year]   Contact Information    Name Relation Home Work Mobile   Willey Blade Spouse [phone]         Code Status History    Date Active Date Inactive Code Status Order ID Comments User Context   06/10/2015 1452 06/16/2015 1550 Full Code [id number]  Harrie Foreman, MD Inpatient   Advance Care Planning Activity    Questions for Most Recent Historical Code Status (Order [id number])        Chief Complaint  Patient presents with  . Acute Visit    TMJ, Lump by left ear    HISTORY OF PRESENT ILLNESS:   This is an 85 year old female who had a telephone visit. She complains of a "painful jaw". Last night, she had pain on her bilateral shoulders with a low grade fever 100.5. She took ASA 325 mg PO X 1. Fever did not recur and her shoulders does not hurt this morning. She feels like the "left ear bone joints are swollen causing it to be tender. She stated that she has no problem swallowing, no redness on her tonsils and no SOB. She said that she had a bout of bad rheumatoid arthritis several years ago and was put on IV steroid in the hospital.     PAST MEDICAL HISTORY:  Past Medical History:  Diagnosis Date  . Diverticulosis   . DVT (deep venous thrombosis) ([place]) 2012   after TKR  . Hyperlipidemia   . Hyperparathyroidism ([place])   . Hypothyroidism   . IBS (irritable bowel syndrome)   . Inflammatory  arthritis   . Osteoarthritis, knee   . Recurrent major depression ([place])      CURRENT MEDICATIONS: Reviewed  Patient's Medications  New Prescriptions   No medications on file  Previous Medications   ASPIRIN-ACETAMINOPHEN-CAFFEINE (EXCEDRIN MIGRAINE) 250-250-65 MG TABLET    Take 1 tablet by mouth every 6 (six) hours as needed for headache.   CLONAZEPAM (KLONOPIN) 1 MG TABLET    Take 1 tablet (1 mg total) by mouth at bedtime.   LEVOTHYROXINE (SYNTHROID) 75 MCG TABLET    TAKE ONE TABLET BY MOUTH DAILY BEFORE BREAKFAST   MIRTAZAPINE (REMERON) 15 MG TABLET    Take 1 tablet (15 mg total) by mouth at bedtime.   MULTIPLE VITAMIN (MULTIVITAMIN WITH MINERALS) TABS TABLET    Take 1 tablet by mouth daily.   OMEPRAZOLE (PRILOSEC) 20 MG CAPSULE    Take 20 mg by mouth daily.   PROBIOTIC PRODUCT (ALIGN) 4 MG CAPS    Take 1 capsule by mouth daily.   VENLAFAXINE XR (EFFEXOR-XR) 150 MG 24 HR CAPSULE    Take 1 capsule (150 mg total) by mouth daily. Pt takes with a 75mg  capsule.   VENLAFAXINE XR (EFFEXOR-XR) 75 MG 24 HR CAPSULE    Take 1 capsule (75 mg total) by mouth daily. Pt takes with a 150mg  capsule.  Modified  Medications   No medications on file  Discontinued Medications   No medications on file     No Known Allergies   REVIEW OF SYSTEMS:  GENERAL: Had fever SKIN: Denies rash, itching, wounds, ulcer sores, or nail abnormality EYES: Denies change in vision, dry eyes, eye pain, itching or discharge EARS: Denies change in hearing, ringing in ears, or earache NOSE: Denies nasal congestion or epistaxis MOUTH and THROAT: Denies oral discomfort, gingival pain or bleeding, pain from teeth or hoarseness   RESPIRATORY: no cough, SOB, DOE, wheezing, hemoptysis CARDIAC: no chest pain, edema or palpitations GI: no abdominal pain, diarrhea, constipation, heart burn, nausea or vomiting GU: Denies dysuria, frequency, hematuria, incontinence, or discharge MUSCULOSKELETAL: Had left jaw pain NEUROLOGICAL:  Denies dizziness, syncope, numbness, or headache PSYCHIATRIC: Denies feeling of depression or anxiety. No report of hallucinations, insomnia, paranoia, or agitation    LABS/RADIOLOGY: Labs reviewed: Basic Metabolic Panel: Recent Labs    01/09/20 1640  NA 138  K 4.9  CL 103  CO2 29  GLUCOSE 99  BUN 19  CREATININE 0.97  CALCIUM 11.1*  PHOS 3.5   Liver Function Tests: Recent Labs    01/09/20 1640  AST 13  ALT 18  ALKPHOS 106  BILITOT 0.4  PROT 7.0  ALBUMIN 4.4  4.4    CBC: Recent Labs    01/09/20 1640  WBC 8.8  HGB 13.4  HCT 40.9  MCV 85.1  PLT 427.0*    ASSESSMENT/PLAN:  1. Temporomandibular disorder -  Will start on Prednisone pack taper - predniSONE (STERAPRED UNI-PAK 21 TAB) 10 MG (21) TBPK tablet; Take as directed on packaging  Dispense: 21 tablet; Refill: 0 -  Instructed to call the office if symptoms get worse.    Time spent on non face to face visit:  12 minutes  The patient gave consent to this telephone visit. Explained to the patient the risk and privacy issue that was involved with this telephone call.   The patient was advised to call back and ask for an in-person evaluation if the symptoms worsen or if the condition fails to improve.   Durenda Age, NP [employer] [phone]

## 2020-07-13 NOTE — Patient Instructions (Signed)

## 2020-08-22 DIAGNOSIS — M1120 Other chondrocalcinosis, unspecified site: Secondary | ICD-10-CM | POA: Diagnosis not present

## 2020-08-22 DIAGNOSIS — M175 Other unilateral secondary osteoarthritis of knee: Secondary | ICD-10-CM | POA: Diagnosis not present

## 2020-08-22 DIAGNOSIS — M25561 Pain in right knee: Secondary | ICD-10-CM | POA: Diagnosis not present

## 2020-08-22 DIAGNOSIS — W010XXA Fall on same level from slipping, tripping and stumbling without subsequent striking against object, initial encounter: Secondary | ICD-10-CM | POA: Diagnosis not present

## 2020-08-22 DIAGNOSIS — M25461 Effusion, right knee: Secondary | ICD-10-CM | POA: Diagnosis not present

## 2020-10-07 ENCOUNTER — Other Ambulatory Visit: Payer: Self-pay | Admitting: Internal Medicine

## 2020-10-08 NOTE — Telephone Encounter (Signed)
Last written 07/09/2020 #90 Last OV 01/09/2020 No future OV Kristopher Oppenheim

## 2020-10-15 DIAGNOSIS — M175 Other unilateral secondary osteoarthritis of knee: Secondary | ICD-10-CM | POA: Diagnosis not present

## 2020-10-15 DIAGNOSIS — M25561 Pain in right knee: Secondary | ICD-10-CM | POA: Diagnosis not present

## 2020-10-15 DIAGNOSIS — M1120 Other chondrocalcinosis, unspecified site: Secondary | ICD-10-CM | POA: Diagnosis not present

## 2020-10-15 DIAGNOSIS — W010XXD Fall on same level from slipping, tripping and stumbling without subsequent striking against object, subsequent encounter: Secondary | ICD-10-CM | POA: Diagnosis not present

## 2020-10-15 DIAGNOSIS — M25461 Effusion, right knee: Secondary | ICD-10-CM | POA: Diagnosis not present

## 2020-11-01 DIAGNOSIS — Z23 Encounter for immunization: Secondary | ICD-10-CM | POA: Diagnosis not present

## 2020-11-23 DIAGNOSIS — M175 Other unilateral secondary osteoarthritis of knee: Secondary | ICD-10-CM | POA: Diagnosis not present

## 2020-11-23 DIAGNOSIS — M25561 Pain in right knee: Secondary | ICD-10-CM | POA: Diagnosis not present

## 2020-11-23 DIAGNOSIS — M1120 Other chondrocalcinosis, unspecified site: Secondary | ICD-10-CM | POA: Diagnosis not present

## 2020-11-23 DIAGNOSIS — G8929 Other chronic pain: Secondary | ICD-10-CM | POA: Diagnosis not present

## 2020-11-23 DIAGNOSIS — W010XXD Fall on same level from slipping, tripping and stumbling without subsequent striking against object, subsequent encounter: Secondary | ICD-10-CM | POA: Diagnosis not present

## 2020-11-23 DIAGNOSIS — M25461 Effusion, right knee: Secondary | ICD-10-CM | POA: Diagnosis not present

## 2021-01-07 ENCOUNTER — Other Ambulatory Visit: Payer: Self-pay | Admitting: Internal Medicine

## 2021-01-08 ENCOUNTER — Other Ambulatory Visit: Payer: Self-pay | Admitting: Family

## 2021-01-08 MED ORDER — CLONAZEPAM 1 MG PO TABS: 1.0000 mg | ORAL_TABLET | Freq: Every day | ORAL | 0 refills | Status: DC

## 2021-01-08 NOTE — Telephone Encounter (Signed)
Last written 10/08/2020 #90 Last OV 01/09/2020 No future OV Wendy Murray

## 2021-01-14 DIAGNOSIS — Z20822 Contact with and (suspected) exposure to covid-19: Secondary | ICD-10-CM | POA: Diagnosis not present

## 2021-01-14 DIAGNOSIS — G459 Transient cerebral ischemic attack, unspecified: Secondary | ICD-10-CM

## 2021-01-14 HISTORY — DX: Transient cerebral ischemic attack, unspecified: G45.9

## 2021-01-22 ENCOUNTER — Emergency Department: Payer: Medicare Other

## 2021-01-22 ENCOUNTER — Other Ambulatory Visit: Payer: Self-pay

## 2021-01-22 DIAGNOSIS — R2681 Unsteadiness on feet: Secondary | ICD-10-CM | POA: Diagnosis not present

## 2021-01-22 DIAGNOSIS — R4701 Aphasia: Secondary | ICD-10-CM | POA: Diagnosis not present

## 2021-01-22 DIAGNOSIS — Z87891 Personal history of nicotine dependence: Secondary | ICD-10-CM | POA: Insufficient documentation

## 2021-01-22 DIAGNOSIS — E039 Hypothyroidism, unspecified: Secondary | ICD-10-CM | POA: Insufficient documentation

## 2021-01-22 DIAGNOSIS — I10 Essential (primary) hypertension: Secondary | ICD-10-CM | POA: Diagnosis not present

## 2021-01-22 DIAGNOSIS — R0902 Hypoxemia: Secondary | ICD-10-CM | POA: Diagnosis not present

## 2021-01-22 DIAGNOSIS — G319 Degenerative disease of nervous system, unspecified: Secondary | ICD-10-CM | POA: Diagnosis not present

## 2021-01-22 DIAGNOSIS — Z20822 Contact with and (suspected) exposure to covid-19: Secondary | ICD-10-CM | POA: Diagnosis not present

## 2021-01-22 DIAGNOSIS — R29818 Other symptoms and signs involving the nervous system: Secondary | ICD-10-CM | POA: Diagnosis not present

## 2021-01-22 DIAGNOSIS — Z79899 Other long term (current) drug therapy: Secondary | ICD-10-CM | POA: Diagnosis not present

## 2021-01-22 DIAGNOSIS — Z7982 Long term (current) use of aspirin: Secondary | ICD-10-CM | POA: Diagnosis not present

## 2021-01-22 DIAGNOSIS — G459 Transient cerebral ischemic attack, unspecified: Secondary | ICD-10-CM | POA: Diagnosis not present

## 2021-01-22 DIAGNOSIS — R0602 Shortness of breath: Secondary | ICD-10-CM | POA: Diagnosis not present

## 2021-01-22 DIAGNOSIS — I739 Peripheral vascular disease, unspecified: Secondary | ICD-10-CM | POA: Diagnosis not present

## 2021-01-22 LAB — COMPREHENSIVE METABOLIC PANEL
ALT: 24 U/L (ref 0–44)
AST: 20 U/L (ref 15–41)
Albumin: 4.1 g/dL (ref 3.5–5.0)
Alkaline Phosphatase: 107 U/L (ref 38–126)
Anion gap: 7 (ref 5–15)
BUN: 19 mg/dL (ref 8–23)
CO2: 28 mmol/L (ref 22–32)
Calcium: 10.4 mg/dL — ABNORMAL HIGH (ref 8.9–10.3)
Chloride: 104 mmol/L (ref 98–111)
Creatinine, Ser: 0.74 mg/dL (ref 0.44–1.00)
GFR, Estimated: >60 mL/min (ref >60)
Glucose, Bld: 107 mg/dL — ABNORMAL HIGH (ref 70–99)
Potassium: 3.7 mmol/L (ref 3.5–5.1)
Sodium: 139 mmol/L (ref 135–145)
Total Bilirubin: 0.5 mg/dL (ref 0.3–1.2)
Total Protein: 7.1 g/dL (ref 6.5–8.1)

## 2021-01-22 LAB — PROTIME-INR
INR: 0.9 (ref 0.8–1.2)
Prothrombin Time: 12.3 seconds (ref 11.4–15.2)

## 2021-01-22 LAB — CBC
HCT: 41.9 % (ref 36.0–46.0)
Hemoglobin: 13.4 g/dL (ref 12.0–15.0)
MCH: 27.1 pg (ref 26.0–34.0)
MCHC: 32 g/dL (ref 30.0–36.0)
MCV: 84.8 fL (ref 80.0–100.0)
Platelets: 363 10*3/uL (ref 150–400)
RBC: 4.94 MIL/uL (ref 3.87–5.11)
RDW: 13.6 % (ref 11.5–15.5)
WBC: 8.6 10*3/uL (ref 4.0–10.5)
nRBC: 0 % (ref 0.0–0.2)

## 2021-01-22 LAB — DIFFERENTIAL
Abs Immature Granulocytes: 0.02 10*3/uL (ref 0.00–0.07)
Basophils Absolute: 0 10*3/uL (ref 0.0–0.1)
Basophils Relative: 1 %
Eosinophils Absolute: 0.3 10*3/uL (ref 0.0–0.5)
Eosinophils Relative: 3 %
Immature Granulocytes: 0 %
Lymphocytes Relative: 25 %
Lymphs Abs: 2.1 10*3/uL (ref 0.7–4.0)
Monocytes Absolute: 1 10*3/uL (ref 0.1–1.0)
Monocytes Relative: 11 %
Neutro Abs: 5.2 10*3/uL (ref 1.7–7.7)
Neutrophils Relative %: 60 %

## 2021-01-22 LAB — CBG MONITORING, ED: Glucose-Capillary: 102 mg/dL — ABNORMAL HIGH (ref 70–99)

## 2021-01-22 LAB — APTT: aPTT: 26 seconds (ref 24–36)

## 2021-01-22 MED ORDER — SODIUM CHLORIDE 0.9% FLUSH: 3.0000 mL | Freq: Once | INTRAVENOUS | Status: DC

## 2021-01-22 NOTE — ED Notes (Signed)
Pt requesting water at this time, swallow screen completed in triage. Pt passed given cup of water. Updated on wait times as well.

## 2021-01-22 NOTE — ED Triage Notes (Signed)
First Nurse Note:  While on the phone with daughter had a moment of aphasia.  On EMS arrival, patient without symptoms.  VS wnl.  CBG:  95

## 2021-01-22 NOTE — ED Triage Notes (Signed)
Pt to ED EMS from [facility] for aphasia around 1600 today. Denies weakness or other sx at this time. No aphasia noted, denies feeling confused or having trouble finding words at this time Cbg 102

## 2021-01-23 ENCOUNTER — Encounter: Payer: Self-pay | Admitting: Internal Medicine

## 2021-01-23 ENCOUNTER — Observation Stay
Admission: EM | Admit: 2021-01-23 | Discharge: 2021-01-24 | Disposition: A | Discharge status: Home / Self Care | Payer: Medicare Other | Attending: Internal Medicine | Admitting: Internal Medicine

## 2021-01-23 ENCOUNTER — Emergency Department: Payer: Medicare Other

## 2021-01-23 ENCOUNTER — Observation Stay: Payer: Medicare Other

## 2021-01-23 DIAGNOSIS — G459 Transient cerebral ischemic attack, unspecified: Secondary | ICD-10-CM

## 2021-01-23 DIAGNOSIS — K589 Irritable bowel syndrome without diarrhea: Secondary | ICD-10-CM | POA: Diagnosis present

## 2021-01-23 DIAGNOSIS — E039 Hypothyroidism, unspecified: Secondary | ICD-10-CM | POA: Diagnosis present

## 2021-01-23 DIAGNOSIS — Z8673 Personal history of transient ischemic attack (TIA), and cerebral infarction without residual deficits: Secondary | ICD-10-CM | POA: Diagnosis present

## 2021-01-23 DIAGNOSIS — I6523 Occlusion and stenosis of bilateral carotid arteries: Secondary | ICD-10-CM | POA: Diagnosis not present

## 2021-01-23 DIAGNOSIS — G319 Degenerative disease of nervous system, unspecified: Secondary | ICD-10-CM | POA: Diagnosis not present

## 2021-01-23 LAB — RESP PANEL BY RT-PCR (FLU A&B, COVID) ARPGX2
Influenza A by PCR: NEGATIVE
Influenza B by PCR: NEGATIVE
SARS Coronavirus 2 by RT PCR: NEGATIVE

## 2021-01-23 LAB — CREATININE, SERUM
Creatinine, Ser: 0.81 mg/dL (ref 0.44–1.00)
GFR, Estimated: >60 mL/min (ref >60)

## 2021-01-23 LAB — CBC
HCT: 40.9 % (ref 36.0–46.0)
Hemoglobin: 13.6 g/dL (ref 12.0–15.0)
MCH: 28.9 pg (ref 26.0–34.0)
MCHC: 33.3 g/dL (ref 30.0–36.0)
MCV: 86.8 fL (ref 80.0–100.0)
Platelets: 379 10*3/uL (ref 150–400)
RBC: 4.71 MIL/uL (ref 3.87–5.11)
RDW: 13.6 % (ref 11.5–15.5)
WBC: 11.2 10*3/uL — ABNORMAL HIGH (ref 4.0–10.5)
nRBC: 0 % (ref 0.0–0.2)

## 2021-01-23 LAB — LIPID PANEL
Cholesterol: 258 mg/dL — ABNORMAL HIGH (ref 0–200)
HDL: 45 mg/dL (ref >40)
LDL Cholesterol: 172 mg/dL — ABNORMAL HIGH (ref 0–99)
Total CHOL/HDL Ratio: 5.7 RATIO
Triglycerides: 203 mg/dL — ABNORMAL HIGH (ref <150)
VLDL: 41 mg/dL — ABNORMAL HIGH (ref 0–40)

## 2021-01-23 LAB — HEMOGLOBIN A1C
Hgb A1c MFr Bld: 6.1 % — ABNORMAL HIGH (ref 4.8–5.6)
Mean Plasma Glucose: 128.37 mg/dL

## 2021-01-23 LAB — TROPONIN I (HIGH SENSITIVITY): Troponin I (High Sensitivity): 6 ng/L (ref <18)

## 2021-01-23 MED ORDER — PANTOPRAZOLE SODIUM 40 MG PO TBEC
40.0000 mg | DELAYED_RELEASE_TABLET | Freq: Every day | ORAL | Status: DC
  Administered 2021-01-23 – 2021-01-24 (×2): 40 mg via ORAL
  Filled 2021-01-23 (×2): qty 1

## 2021-01-23 MED ORDER — LOPERAMIDE HCL 2 MG PO CAPS
2.0000 mg | ORAL_CAPSULE | Freq: Once | ORAL | Status: AC
  Administered 2021-01-23: 2 mg via ORAL
  Filled 2021-01-23: qty 1

## 2021-01-23 MED ORDER — LEVOTHYROXINE SODIUM 50 MCG PO TABS
75.0000 ug | ORAL_TABLET | Freq: Every day | ORAL | Status: DC
  Administered 2021-01-24: 06:00:00 75 ug via ORAL
  Filled 2021-01-23: qty 1

## 2021-01-23 MED ORDER — VENLAFAXINE HCL ER 75 MG PO CP24
150.0000 mg | ORAL_CAPSULE | Freq: Every day | ORAL | Status: DC
  Administered 2021-01-23 – 2021-01-24 (×2): 150 mg via ORAL
  Filled 2021-01-23: qty 1
  Filled 2021-01-23: qty 2
  Filled 2021-01-23: qty 1
  Filled 2021-01-23: qty 2

## 2021-01-23 MED ORDER — ATORVASTATIN CALCIUM 20 MG PO TABS
10.0000 mg | ORAL_TABLET | Freq: Every day | ORAL | Status: DC
  Administered 2021-01-23: 10 mg via ORAL
  Filled 2021-01-23: qty 1

## 2021-01-23 MED ORDER — MIRTAZAPINE 15 MG PO TABS
15.0000 mg | ORAL_TABLET | Freq: Every day | ORAL | Status: DC
  Administered 2021-01-23: 15 mg via ORAL
  Filled 2021-01-23: qty 1

## 2021-01-23 MED ORDER — ATORVASTATIN CALCIUM 20 MG PO TABS
40.0000 mg | ORAL_TABLET | Freq: Every day | ORAL | Status: DC
  Administered 2021-01-24: 09:00:00 40 mg via ORAL
  Filled 2021-01-23: qty 2

## 2021-01-23 MED ORDER — VENLAFAXINE HCL ER 75 MG PO CP24: 75.0000 mg | ORAL_CAPSULE | Freq: Every day | ORAL | Status: DC

## 2021-01-23 MED ORDER — VENLAFAXINE HCL ER 75 MG PO CP24
75.0000 mg | ORAL_CAPSULE | Freq: Every day | ORAL | Status: DC
  Administered 2021-01-23 – 2021-01-24 (×2): 75 mg via ORAL
  Filled 2021-01-23 (×4): qty 1

## 2021-01-23 MED ORDER — STROKE: EARLY STAGES OF RECOVERY BOOK: Freq: Once | Status: DC

## 2021-01-23 MED ORDER — ACETAMINOPHEN 325 MG PO TABS
650.0000 mg | ORAL_TABLET | ORAL | Status: DC | PRN
  Administered 2021-01-23: 650 mg via ORAL
  Filled 2021-01-23 (×2): qty 2

## 2021-01-23 MED ORDER — VENLAFAXINE HCL ER 150 MG PO CP24: 150.0000 mg | ORAL_CAPSULE | Freq: Every day | ORAL | Status: DC

## 2021-01-23 MED ORDER — CLONAZEPAM 0.5 MG PO TABS
1.0000 mg | ORAL_TABLET | Freq: Every day | ORAL | Status: DC
  Administered 2021-01-23: 1 mg via ORAL
  Filled 2021-01-23: qty 2

## 2021-01-23 MED ORDER — ENOXAPARIN SODIUM 40 MG/0.4ML IJ SOSY
40.0000 mg | PREFILLED_SYRINGE | INTRAMUSCULAR | Status: DC
  Administered 2021-01-23 – 2021-01-24 (×2): 40 mg via SUBCUTANEOUS
  Filled 2021-01-23 (×2): qty 0.4

## 2021-01-23 MED ORDER — ACETAMINOPHEN 650 MG RE SUPP: 650.0000 mg | RECTAL | Status: DC | PRN

## 2021-01-23 MED ORDER — ASPIRIN EC 81 MG PO TBEC
81.0000 mg | DELAYED_RELEASE_TABLET | Freq: Every day | ORAL | Status: DC
  Administered 2021-01-23 – 2021-01-24 (×2): 81 mg via ORAL
  Filled 2021-01-23 (×2): qty 1

## 2021-01-23 MED ORDER — ASPIRIN 81 MG PO CHEW
324.0000 mg | CHEWABLE_TABLET | Freq: Once | ORAL | Status: AC
  Administered 2021-01-23: 324 mg via ORAL
  Filled 2021-01-23: qty 4

## 2021-01-23 MED ORDER — ACETAMINOPHEN 160 MG/5ML PO SOLN
650.0000 mg | ORAL | Status: DC | PRN
  Filled 2021-01-23: qty 20.3

## 2021-01-23 NOTE — ED Provider Notes (Signed)
Emergency Medicine Provider Triage Evaluation Note  MANDE KOLASINSKI , a 85 y.o. female  was evaluated in triage.  Pt complains of several episodes of intermittent expressive aphasia that started at 4PM. No prior history of CVA. Patient reports that her words were not making sense and each episode lasted a few minutes at a time. No other deficits  Review of Systems  Positive: + aphasia Negative: - facial droop, dizziness, unilateral weakness or numbness  Physical Exam  BP (!) 172/89 (BP Location: Right Arm)   Pulse 85   Temp 97.8 F (36.6 C) (Oral)   Resp 18   Ht '5\' 5"'$  (1.651 m)   Wt 79.4 kg   SpO2 94%   BMI 29.12 kg/m  Gen:   Awake, no distress   Resp:  Normal effort  MSK:   Moves extremities without difficulty  Neuro:  Normal speech and language, intact strength and sensation x 4, face is symmetric    Medical Decision Making  Medically screening exam initiated at 1:03 AM.  Appropriate orders placed.  [name] was informed that the remainder of the evaluation will be completed by another provider, this initial triage assessment does not replace that evaluation, and the importance of remaining in the ED until their evaluation is complete.  72F presenting with intermittent episodes of aphasia. Neuro intact at this time. Concerning for TIAs. CT and labs with no acute abnormalities. Will get MRI of the brain and give full ASA. Anticipate admission for TIA workup. Patient in agreement   Alfred Levins, [place], MD 01/23/21 [phone]

## 2021-01-23 NOTE — ED Notes (Signed)
Pt is roomed by MRI Tech. This RN introduced herself to the patient and attached her to the monitor to obtain VS. NIHSS performed.

## 2021-01-23 NOTE — ED Notes (Signed)
US bedside

## 2021-01-23 NOTE — ED Provider Notes (Signed)
[facility] Emergency Department Provider Note  ____________________________________________  Time seen: Approximately 1:59 AM  I have reviewed the triage vital signs and the nursing notes.   HISTORY  Chief Complaint Aphasia   HPI Wendy Murray is a 85 y.o. female with history of hypertension, hyperparathyroidism, hypothyroidism, IBS, osteoarthritis who presents for evaluation of aphasia.  Patient reports several episodes of short-lived expressive aphasia.  The longest lasting about 8 minutes.  These episodes started this afternoon.  She denies any prior history of stroke.  She denies facial droop, slurred speech, unilateral weakness or numbness, dizziness.  She reports that she was able to to speak but her words made no sense.  Past Medical History:  Diagnosis Date   Diverticulosis    DVT (deep venous thrombosis) ([place]) 2012   after TKR   Hyperlipidemia    Hyperparathyroidism ([place])    Hypothyroidism    IBS (irritable bowel syndrome)    Inflammatory arthritis    Osteoarthritis, knee    Recurrent major depression ([place])     Patient Active Problem List   Diagnosis Date Noted   GERD (gastroesophageal reflux disease) 12/16/2016   Hyperparathyroidism ([place])    Advance directive discussed with patient 12/13/2014   Routine general medical examination at a health care facility 12/12/2013   Hyperlipidemia    Recurrent major depression ([place])    Hypothyroidism    Osteoarthritis, knee    IBS (irritable bowel syndrome)     Past Surgical History:  Procedure Laterality Date   ABDOMINAL HYSTERECTOMY  1991   CATARACT EXTRACTION W/ INTRAOCULAR LENS  IMPLANT, BILATERAL  2014   CHOLECYSTECTOMY  1998   DEXA  3/09   Normal   HAMMER TOE SURGERY  5/16   Dr Barkley Bruns   LUMBAR FUSION  1993   MENISCUS REPAIR Left 11/11   TOTAL KNEE ARTHROPLASTY Left 2012    Prior to Admission medications   Medication Sig Start Date End Date Taking? Authorizing Provider   aspirin-acetaminophen-caffeine (EXCEDRIN MIGRAINE) [phone] MG tablet Take 1 tablet by mouth every 6 (six) hours as needed for headache.    [provider]  clonazePAM (KLONOPIN) 1 MG tablet TAKE ONE TABLET BY MOUTH EVERY NIGHT AT BEDTIME 01/08/21   Dutch Quint B, FNP  clonazePAM (KLONOPIN) 1 MG tablet Take 1 tablet (1 mg total) by mouth at bedtime. 01/08/21   Kennyth Arnold, FNP  levothyroxine (SYNTHROID) 75 MCG tablet TAKE ONE TABLET BY MOUTH DAILY BEFORE BREAKFAST 07/10/20   [name] I, MD  mirtazapine (REMERON) 15 MG tablet Take 1 tablet (15 mg total) by mouth at bedtime. 07/10/20   Venia Carbon, MD  Multiple Vitamin (MULTIVITAMIN WITH MINERALS) TABS tablet Take 1 tablet by mouth daily.    [provider]  omeprazole (PRILOSEC) 20 MG capsule Take 20 mg by mouth daily.    [provider]  predniSONE (STERAPRED UNI-PAK 21 TAB) 10 MG (21) TBPK tablet Take as directed on packaging 07/13/20   Medina-Vargas, Monina C, NP  Probiotic Product (ALIGN) 4 MG CAPS Take 1 capsule by mouth daily.    [provider]  venlafaxine XR (EFFEXOR-XR) 150 MG 24 hr capsule Take 1 capsule (150 mg total) by mouth daily. Pt takes with a '75mg'$  capsule. 07/10/20   Venia Carbon, MD  venlafaxine XR (EFFEXOR-XR) 75 MG 24 hr capsule Take 1 capsule (75 mg total) by mouth daily. Pt takes with a '150mg'$  capsule. 07/10/20   Venia Carbon, MD  Allergies Patient has no known allergies.  Family History  Problem Relation Age of Onset   Cancer Mother        colon   Depression Mother    Cancer Father    Depression Sister    Hypertension Neg Hx    Heart disease Neg Hx    Diabetes Neg Hx     Social History Social History   Tobacco Use   Smoking status: Former    Types: Cigarettes    Quit date: 06/16/1981    Years since quitting: 39.6   Smokeless tobacco: Never   Tobacco comments:    quit 1983  Substance Use Topics   Alcohol use: Yes    Alcohol/week: 0.0 standard  drinks    Comment: rarely   Drug use: No    Review of Systems  Constitutional: Negative for fever. Eyes: Negative for visual changes. ENT: Negative for sore throat. Neck: No neck pain  Cardiovascular: Negative for chest pain. Respiratory: Negative for shortness of breath. Gastrointestinal: Negative for abdominal pain, vomiting or diarrhea. Genitourinary: Negative for dysuria. Musculoskeletal: Negative for back pain. Skin: Negative for rash. Neurological: Negative for headaches, weakness or numbness. + aphasia Psych: No SI or HI  ____________________________________________   PHYSICAL EXAM:  VITAL SIGNS: ED Triage Vitals  Enc Vitals Group     BP 01/22/21 1721 (!) 141/107     Pulse Rate 01/22/21 1721 87     Resp 01/22/21 1721 18     Temp 01/22/21 1721 97.8 F (36.6 C)     Temp Source 01/22/21 1721 Oral     SpO2 01/22/21 1721 92 %     Weight 01/22/21 1721 175 lb (79.4 kg)     Height 01/22/21 1721 '5\' 5"'$  (1.651 m)     Head Circumference --      Peak Flow --      Pain Score 01/22/21 1732 0     Pain Loc --      Pain Edu? --      Excl. in [place]? --     Constitutional: Alert and oriented. Well appearing and in no apparent distress. HEENT:      Head: Normocephalic and atraumatic.         Eyes: Conjunctivae are normal. Sclera is non-icteric.       Mouth/Throat: Mucous membranes are moist.       Neck: Supple with no signs of meningismus. Cardiovascular: Regular rate and rhythm. No murmurs, gallops, or rubs. 2+ symmetrical distal pulses are present in all extremities. No JVD. Respiratory: Normal respiratory effort. Lungs are clear to auscultation bilaterally.  Gastrointestinal: Soft, non tender, and non distended with positive bowel sounds. No rebound or guarding. Genitourinary: No CVA tenderness. Musculoskeletal:  No edema, cyanosis, or erythema of extremities. Neurologic: Normal speech and language. Face is symmetric. Moving all extremities. No gross focal neurologic  deficits are appreciated. Skin: Skin is warm, dry and intact. No rash noted. Psychiatric: Mood and affect are normal. Speech and behavior are normal.  ____________________________________________   LABS (all labs ordered are listed, but only abnormal results are displayed)  Labs Reviewed  COMPREHENSIVE METABOLIC PANEL - Abnormal; Notable for the following components:      Result Value   Glucose, Bld 107 (*)    Calcium 10.4 (*)    All other components within normal limits  CBG MONITORING, ED - Abnormal; Notable for the following components:   Glucose-Capillary 102 (*)    All other components within normal limits  RESP PANEL  BY RT-PCR (FLU A&B, COVID) ARPGX2  PROTIME-INR  APTT  CBC  DIFFERENTIAL  TROPONIN I (HIGH SENSITIVITY)   ____________________________________________  EKG  ED ECG REPORT I, Rudene Re, the attending physician, personally viewed and interpreted this ECG.  Sinus rhythm with no ST elevations or depressions, normal intervals, normal axis ____________________________________________  RADIOLOGY  I have personally reviewed the images performed during this visit and I agree with the Radiologist's read.   Interpretation by Radiologist:  CT HEAD WO CONTRAST  Result Date: 01/22/2021 CLINICAL DATA:  Neuro deficit, acute, stroke suspected aphasia. EXAM: CT HEAD WITHOUT CONTRAST TECHNIQUE: Contiguous axial images were obtained from the base of the skull through the vertex without intravenous contrast. COMPARISON:  None. FINDINGS: Brain: There is atrophy and chronic small vessel disease changes. No acute intracranial abnormality. Specifically, no hemorrhage, hydrocephalus, mass lesion, acute infarction, or significant intracranial injury. Vascular: No hyperdense vessel or unexpected calcification. Skull: No acute calvarial abnormality. Sinuses/Orbits: No acute findings Other: None IMPRESSION: Atrophy, chronic microvascular disease. No acute intracranial  abnormality. Electronically Signed   By: Rolm Baptise M.D.   On: 01/22/2021 18:31     ____________________________________________   PROCEDURES  Procedure(s) performed: yes .1-3 Lead EKG Interpretation  Date/Time: 01/23/2021 2:58 AM Performed by: Rudene Re, MD Authorized by: Rudene Re, MD     Interpretation: non-specific     ECG rate assessment: normal     Rhythm: sinus rhythm     Ectopy: none     Conduction: normal    Critical Care performed:  None ____________________________________________   INITIAL IMPRESSION / ASSESSMENT AND PLAN / ED COURSE  85 y.o. female with history of hypertension, hyperparathyroidism, hypothyroidism, IBS, osteoarthritis who presents for evaluation of aphasia. patient is currently neurologically intact with no signs of aphasia.  Presentation is concerning for TIA.  Head CT visualized by me with no acute findings.  MRI has been ordered.  Labs with no acute abnormalities.  EKG with no signs of dysrhythmias.  Will discuss with the hospitalist for admission.  Patient passed swallow study.  Given aspirin.      _____________________________________________ Please note:  Patient was evaluated in Emergency Department today for the symptoms described in the history of present illness. Patient was evaluated in the context of the global COVID-19 pandemic, which necessitated consideration that the patient might be at risk for infection with the SARS-CoV-2 virus that causes COVID-19. Institutional protocols and algorithms that pertain to the evaluation of patients at risk for COVID-19 are in a state of rapid change based on information released by regulatory bodies including the CDC and federal and state organizations. These policies and algorithms were followed during the patient's care in the ED.  Some ED evaluations and interventions may be delayed as a result of limited staffing during the pandemic.   [place] Controlled Substance Database was reviewed  by me. ____________________________________________   FINAL CLINICAL IMPRESSION(S) / ED DIAGNOSES   Final diagnoses:  TIA (transient ischemic attack)      NEW MEDICATIONS STARTED DURING THIS VISIT:  ED Discharge Orders     None        Note:  This document was prepared using Dragon voice recognition software and may include unintentional dictation errors.    Alfred Levins, [place], MD 01/23/21 0300

## 2021-01-23 NOTE — ED Notes (Signed)
Getting ready to send patient, any questions

## 2021-01-23 NOTE — ED Notes (Signed)
Patient resting, arouses easily. Speech is clear and patient denies headache/dizziness.  She complains of diarhhea that she state is normal due to IBS, but is requesting something for it.  RN will message provider bed low and locked call bell in reach.

## 2021-01-23 NOTE — Evaluation (Signed)
Occupational Therapy Evaluation Patient Details Name: Wendy Murray MRN: [id number] DOB: Aug 02, 1935 Today's Date: 01/23/2021    History of Present Illness Pt is a 85 y.o. female who presents to the ED for evaluation of 5 episodes during the day of difficulty word finding and articulating her speech with the longest episode lasting up to 8 minutes. PMH: Hypothyroidism, osteoarthritis, depression, IBS. No acute findings on CT & MRI of head   Clinical Impression   Pt seen for OT evaluation this date. Prior to admission, pt was independent in all ADLs and functional mobility (without AD), living in an [place] with husband. Pt reporting that word finding difficulties have resolved and no strength, sensory, coordination, cognitive, or visual deficits appreciated with assessment. Pt endorses 2 falls within the past 6 months, and demonstrated some difficultly with dynamic standing balance this date, warranting SUPERVISION for functional mobility without AD and SUPERVISION for standing grooming tasks. Anticipate that pt's balance will improve in subsequent sessions with use of AD/DME during standing ADLs/functional mobility. Pt would benefit from additional skilled OT services to address balance during standing ADLs/functional mobility and to minimize risk of future falls, injury, and readmission. Upon discharge, recommend no OT follow up.       Follow Up Recommendations  No OT follow up    Equipment Recommendations  Other (comment) (2ww)       Precautions / Restrictions Precautions Precautions: None Restrictions Weight Bearing Restrictions: No      Mobility Bed Mobility Overal bed mobility: Modified Independent             General bed mobility comments: With use of bedrails and HOB elevated    Transfers Overall transfer level: Needs assistance Equipment used: None Transfers: Sit to/from Stand Sit to Stand: Supervision              Balance Overall  balance assessment: Needs assistance Sitting-balance support: No upper extremity supported;Feet unsupported Sitting balance-Leahy Scale: Normal     Standing balance support: No upper extremity supported;During functional activity Standing balance-Leahy Scale: Fair Standing balance comment: Able to maintain balance without support. One lateral LOB observed, however pt able to recover independently                           ADL either performed or assessed with clinical judgement   ADL Overall ADL's : Needs assistance/impaired     Grooming: Wash/dry hands;Supervision/safety;Standing               Lower Body Dressing: Set up;Supervision/safety;Bed level Lower Body Dressing Details (indicate cue type and reason): to don socks             Functional mobility during ADLs: Min guard       Vision Baseline Vision/History: Wears glasses Wears Glasses: At all times Patient Visual Report: No change from baseline Vision Assessment?: No apparent visual deficits            Pertinent Vitals/Pain Pain Assessment: Faces Faces Pain Scale: Hurts a little bit Pain Location: Chronic R knee pain Pain Descriptors / Indicators: Aching Pain Intervention(s): Limited activity within patient's tolerance;Monitored during session     Hand Dominance     Extremity/Trunk Assessment Upper Extremity Assessment Upper Extremity Assessment: Overall WFL for tasks assessed   Lower Extremity Assessment Lower Extremity Assessment: Defer to PT evaluation   Cervical / Trunk Assessment Cervical / Trunk Assessment: Normal   Communication Communication Communication: No difficulties   Cognition Arousal/Alertness:  Awake/alert Behavior During Therapy: WFL for tasks assessed/performed Overall Cognitive Status: Within Functional Limits for tasks assessed                                                Home Living Family/patient expects to be discharged to:: Private  residence Living Arrangements: Spouse/significant other Available Help at Discharge: Family Type of Home: Independent living facility Home Access: Level entry     Home Layout: One level     Bathroom Shower/Tub: Occupational psychologist: Standard     Home Equipment: Grab bars - toilet;Grab bars - tub/shower          Prior Functioning/Environment Level of Independence: Independent        Comments: Pt reports that she is independent with functional mobilty and ADLs at baseline. Pt has been caring for her husband, who recently was recently hospitalized for hip fx. Pt does not use AD but endorses R knee pain and has appotinment with Dr Marry Guan. Pt endorses 2 falls from sitting in computer chair        OT Problem List: Impaired balance (sitting and/or standing);Decreased knowledge of use of DME or AE      OT Treatment/Interventions: Self-care/ADL training;Balance training;Therapeutic activities;Patient/family education;DME and/or AE instruction    OT Goals(Current goals can be found in the care plan section) Acute Rehab OT Goals Patient Stated Goal: to go home OT Goal Formulation: With patient Time For Goal Achievement: 02/06/21 Potential to Achieve Goals: Good ADL Goals Pt Will Perform Grooming: with modified independence;standing Pt Will Transfer to Toilet: with modified independence;regular height toilet;ambulating Pt Will Perform Toileting - Clothing Manipulation and hygiene: with modified independence;sitting/lateral leans  OT Frequency: Min 1X/week    AM-PAC OT "6 Clicks" Daily Activity     Outcome Measure Help from another person eating meals?: None Help from another person taking care of personal grooming?: A Little Help from another person toileting, which includes using toliet, bedpan, or urinal?: A Little Help from another person bathing (including washing, rinsing, drying)?: A Little Help from another person to put on and taking off regular upper body  clothing?: None Help from another person to put on and taking off regular lower body clothing?: A Little 6 Click Score: 20   End of Session Equipment Utilized During Treatment: Gait belt Nurse Communication: Mobility status  Activity Tolerance: Patient tolerated treatment well Patient left: in bed;with call bell/phone within reach;Other (comment) (with MD)  OT Visit Diagnosis: Unsteadiness on feet (R26.81)                Time: [id number] OT Time Calculation (min): 20 min Charges:  OT General Charges $OT Visit: 1 Visit OT Evaluation $OT Eval Moderate Complexity: 1 Mod OT Treatments $Self Care/Home Management : 8-22 mins  Fredirick Maudlin, OTR/L [place]

## 2021-01-23 NOTE — Progress Notes (Signed)
SLP Cancellation Note  Patient Details Name: Wendy Murray MRN: [id number] DOB: 07/13/1935   Cancelled treatment:       Reason Eval/Treat Not Completed: SLP screened, no needs identified, will sign off (chart reviewed; consulted NSG then met w/ pt). Pt denied any difficulty swallowing and is now on a regular diet -- passed the [facility] swallow screen w/ NSG. Pt denied having any trouble swallowing pills w/ water; educated on using Applesauce as well for ease of swallowing pills. Pt conversed in conversation w/ this SLP w/out overt expressive/receptive deficits noted. Pt asked questions appropriately and requested both breakfast(chose food/drink items) then stated she needed a phone charging cord for her phone. Pt denied any speech-language deficits. Speech clear. Noted MRI results: "No acute intracranial abnormality. 2. Generalized age-related cerebral atrophy with moderate chronic microvascular ischemic disease.". No further skilled ST services indicated currently as pt appears at her baseline. Pt agreed. NSG to reconsult if any change in status while admitted.        , MS, CCC-SLP Speech Language Pathologist [facility] [phone] , 01/23/2021, 9:30 AM 

## 2021-01-23 NOTE — Progress Notes (Signed)
Daughter Peg Golitzin updated with plan of care.

## 2021-01-23 NOTE — Progress Notes (Signed)
No charge progress note.   Wendy Murray is a 85 y.o. female who at baseline lives independently and with medical history significant for Hypothyroidism, osteoarthritis, depression, IBS, who presents to the ED for evaluation of 5 episodes during the day of difficulty word finding and articulating her speech with the longest episode lasting up to 8 minutes.    CT head with chronic microvascular disease and no acute abnormality.  MRI head was also negative for any acute abnormality. Carotid ultrasound with nonsignificant atherosclerosis.  Lipid panel with elevated LDL and cholesterol.  Apparently patient was on statin and was taken off many years ago.  Could not remember why.  Admitted for TIA work-up.  Echocardiogram still pending. PT/OT with no recommendations.  On exam patient with no focal deficit and no other significant abnormality.  Increase the dose of statin to 40 mg daily. Continue with aspirin. Continue with home medications.  Most likely be discharged by tomorrow after completing work-up.

## 2021-01-23 NOTE — H&P (Signed)
History and Physical    Wendy Murray [id number] DOB: 09-Jan-1936 DOA: 01/23/2021  PCP: Venia Carbon, MD   Patient coming from: home  I have personally briefly reviewed patient's old medical records in [place]  Chief Complaint: Difficulty word finding  HPI: Wendy Murray is a 85 y.o. female who at baseline lives independently and with medical history significant for Hypothyroidism, osteoarthritis, depression, IBS, who presents to the ED for evaluation of 5 episodes during the day of difficulty word finding and articulating her speech with the longest episode lasting up to 8 minutes.  She denied associated headache, blurred vision, one-sided numbness tingling or weakness of the extremities or face during these episodes.  She denied palpitations, chest pain or shortness of breath.  She was previously in her usual state of health.  ED course: On arrival afebrile, BP 141/107 with pulse of 87 O2 sat 92% on room air Blood work overall unremarkable with mildly elevated calcium of 10.4.  Troponin 6  EKG, personally viewed and interpreted: Normal sinus at 88 with no acute ST-T wave changes  Imaging: CT head with chronic microvascular disease but no acute intracranial abnormality MRI head pending  Patient given a dose of baby aspirin.  Hospitalist consulted for TIA work-up  Review of Systems: As per HPI otherwise all other systems on review of systems negative.    Past Medical History:  Diagnosis Date   Diverticulosis    DVT (deep venous thrombosis) ([place]) 2012   after TKR   Hyperlipidemia    Hyperparathyroidism ([place])    Hypothyroidism    IBS (irritable bowel syndrome)    Inflammatory arthritis    Osteoarthritis, knee    Recurrent major depression ([place])     Past Surgical History:  Procedure Laterality Date   ABDOMINAL HYSTERECTOMY  1991   CATARACT EXTRACTION W/ INTRAOCULAR LENS  IMPLANT, BILATERAL  2014   CHOLECYSTECTOMY  1998   DEXA  3/09   Normal    HAMMER TOE SURGERY  5/16   Dr Barkley Bruns   LUMBAR FUSION  1993   MENISCUS REPAIR Left 11/11   TOTAL KNEE ARTHROPLASTY Left 2012     reports that she quit smoking about 39 years ago. She has never used smokeless tobacco. She reports current alcohol use. She reports that she does not use drugs.  No Known Allergies  Family History  Problem Relation Age of Onset   Cancer Mother        colon   Depression Mother    Cancer Father    Depression Sister    Hypertension Neg Hx    Heart disease Neg Hx    Diabetes Neg Hx       Prior to Admission medications   Medication Sig Start Date End Date Taking? Authorizing Provider  aspirin-acetaminophen-caffeine (EXCEDRIN MIGRAINE) [phone] MG tablet Take 1 tablet by mouth every 6 (six) hours as needed for headache.    [provider]  clonazePAM (KLONOPIN) 1 MG tablet TAKE ONE TABLET BY MOUTH EVERY NIGHT AT BEDTIME 01/08/21   Dutch Quint B, FNP  clonazePAM (KLONOPIN) 1 MG tablet Take 1 tablet (1 mg total) by mouth at bedtime. 01/08/21   Kennyth Arnold, FNP  levothyroxine (SYNTHROID) 75 MCG tablet TAKE ONE TABLET BY MOUTH DAILY BEFORE BREAKFAST 07/10/20   [name] I, MD  mirtazapine (REMERON) 15 MG tablet Take 1 tablet (15 mg total) by mouth at bedtime. 07/10/20   Venia Carbon, MD  Multiple Vitamin (MULTIVITAMIN WITH MINERALS)  TABS tablet Take 1 tablet by mouth daily.    [provider]  omeprazole (PRILOSEC) 20 MG capsule Take 20 mg by mouth daily.    [provider]  predniSONE (STERAPRED UNI-PAK 21 TAB) 10 MG (21) TBPK tablet Take as directed on packaging 07/13/20   Medina-Vargas, Monina C, NP  Probiotic Product (ALIGN) 4 MG CAPS Take 1 capsule by mouth daily.    [provider]  venlafaxine XR (EFFEXOR-XR) 150 MG 24 hr capsule Take 1 capsule (150 mg total) by mouth daily. Pt takes with a '75mg'$  capsule. 07/10/20   Venia Carbon, MD  venlafaxine XR (EFFEXOR-XR) 75 MG 24 hr capsule Take 1 capsule (75 mg  total) by mouth daily. Pt takes with a '150mg'$  capsule. 07/10/20   Venia Carbon, MD    Physical Exam: Vitals:   01/22/21 1721 01/22/21 1838 01/22/21 2154 01/23/21 0103  BP: (!) 141/107 (!) 154/90 (!) 172/89 (!) 190/76  Pulse: 87 87 85 89  Resp: '18  18 16  '$ Temp: 97.8 F (36.6 C)   98.2 F (36.8 C)  TempSrc: Oral   Oral  SpO2: 92% 100% 94% 99%  Weight: 79.4 kg     Height: '5\' 5"'$  (1.651 m)        Vitals:   01/22/21 1721 01/22/21 1838 01/22/21 2154 01/23/21 0103  BP: (!) 141/107 (!) 154/90 (!) 172/89 (!) 190/76  Pulse: 87 87 85 89  Resp: '18  18 16  '$ Temp: 97.8 F (36.6 C)   98.2 F (36.8 C)  TempSrc: Oral   Oral  SpO2: 92% 100% 94% 99%  Weight: 79.4 kg     Height: '5\' 5"'$  (1.651 m)         Constitutional: Alert and oriented x 3 . Not in any apparent distress HEENT:      Head: Normocephalic and atraumatic.         Eyes: PERLA, EOMI, Conjunctivae are normal. Sclera is non-icteric.       Mouth/Throat: Mucous membranes are moist.       Neck: Supple with no signs of meningismus. Cardiovascular: Regular rate and rhythm. No murmurs, gallops, or rubs. 2+ symmetrical distal pulses are present . No JVD. No LE edema Respiratory: Respiratory effort normal .Lungs sounds clear bilaterally. No wheezes, crackles, or rhonchi.  Gastrointestinal: Soft, non tender, and non distended with positive bowel sounds.  Genitourinary: No CVA tenderness. Musculoskeletal: Nontender with normal range of motion in all extremities. No cyanosis, or erythema of extremities. Neurologic:  Face is symmetric. Moving all extremities. No gross focal neurologic deficits . Skin: Skin is warm, dry.  No rash or ulcers Psychiatric: Mood and affect are normal    Labs on Admission: I have personally reviewed following labs and imaging studies  CBC: Recent Labs  Lab 01/22/21 1734  WBC 8.6  NEUTROABS 5.2  HGB 13.4  HCT 41.9  MCV 84.8  PLT [id number]   Basic Metabolic Panel: Recent Labs  Lab 01/22/21 1734  NA 139   K 3.7  CL 104  CO2 28  GLUCOSE 107*  BUN 19  CREATININE 0.74  CALCIUM 10.4*   GFR: Estimated Creatinine Clearance: 55.5 mL/min (by C-G formula based on SCr of 0.74 mg/dL). Liver Function Tests: Recent Labs  Lab 01/22/21 1734  AST 20  ALT 24  ALKPHOS 107  BILITOT 0.5  PROT 7.1  ALBUMIN 4.1   No results for input(s): LIPASE, AMYLASE in the last 168 hours. No results for input(s): AMMONIA in the last  168 hours. Coagulation Profile: Recent Labs  Lab 01/22/21 1734  INR 0.9   Cardiac Enzymes: No results for input(s): CKTOTAL, CKMB, CKMBINDEX, TROPONINI in the last 168 hours. BNP (last 3 results) No results for input(s): PROBNP in the last 8760 hours. HbA1C: No results for input(s): HGBA1C in the last 72 hours. CBG: Recent Labs  Lab 01/22/21 1732  GLUCAP 102*   Lipid Profile: No results for input(s): CHOL, HDL, LDLCALC, TRIG, CHOLHDL, LDLDIRECT in the last 72 hours. Thyroid Function Tests: No results for input(s): TSH, T4TOTAL, FREET4, T3FREE, THYROIDAB in the last 72 hours. Anemia Panel: No results for input(s): VITAMINB12, FOLATE, FERRITIN, TIBC, IRON, RETICCTPCT in the last 72 hours. Urine analysis:    Component Value Date/Time   COLORURINE YELLOW (A) 06/10/2015 1010   APPEARANCEUR CLEAR (A) 06/10/2015 1010   LABSPEC 1.006 06/10/2015 1010   PHURINE 6.0 06/10/2015 1010   GLUCOSEU NEGATIVE 06/10/2015 1010   HGBUR 1+ (A) 06/10/2015 1010   BILIRUBINUR NEGATIVE 06/10/2015 1010   KETONESUR NEGATIVE 06/10/2015 1010   PROTEINUR NEGATIVE 06/10/2015 1010   NITRITE NEGATIVE 06/10/2015 1010   LEUKOCYTESUR NEGATIVE 06/10/2015 1010    Radiological Exams on Admission: CT HEAD WO CONTRAST  Result Date: 01/22/2021 CLINICAL DATA:  Neuro deficit, acute, stroke suspected aphasia. EXAM: CT HEAD WITHOUT CONTRAST TECHNIQUE: Contiguous axial images were obtained from the base of the skull through the vertex without intravenous contrast. COMPARISON:  None. FINDINGS: Brain: There  is atrophy and chronic small vessel disease changes. No acute intracranial abnormality. Specifically, no hemorrhage, hydrocephalus, mass lesion, acute infarction, or significant intracranial injury. Vascular: No hyperdense vessel or unexpected calcification. Skull: No acute calvarial abnormality. Sinuses/Orbits: No acute findings Other: None IMPRESSION: Atrophy, chronic microvascular disease. No acute intracranial abnormality. Electronically Signed   By: Rolm Baptise M.D.   On: 01/22/2021 18:31     Assessment/Plan 85 year old female who at baseline lives independently and with history of hypothyroidism, osteoarthritis, depression, IBS, presenting with episodes of difficulty word finding with complete resolution between episodes      TIA (transient ischemic attack) -Word finding episodes lasting up to 8 minutes with complete resolution in between - CT head negative - Follow MRI - Continuous cardiac monitoring, echocardiogram and carotid Doppler - Continue aspirin, antiplatelet pending work-up - Neurology consult    Hypothyroidism - Continue levothyroxine    IBS (irritable bowel syndrome) - Stable    DVT prophylaxis: Lovenox  Code Status: full code  Family Communication:  none  Disposition Plan: Back to previous home environment Consults called: neurology  Status:observation    Athena Masse MD [facility]     01/23/2021, 2:41 AM

## 2021-01-23 NOTE — ED Notes (Signed)
Patient to floor via hospital bed and tech.

## 2021-01-23 NOTE — Evaluation (Signed)
Physical Therapy Evaluation Patient Details Name: Wendy Murray MRN: [id number] DOB: [birth year] Today's Date: 01/23/2021   History of Present Illness  Pt is a 85 y.o. female who presents to the ED for evaluation of 5 episodes during the day of difficulty word finding and articulating her speech with the longest episode lasting up to 8 minutes. PMH: Hypothyroidism, osteoarthritis, depression, IBS. No acute findings on CT & MRI of head  Clinical Impression  Patient resting in bed upon arrival to room; alert and oriented to basic information, follows simple commands, but intermittently confused at times.  Bilat UE/LE strength and ROM grossly symmetrical and WFL; no focal weakness, sensory or coordination deficit appreciated.  Able to complete bed mobility with indep; sit/stand, basic transfers and gait (50') without assist device, cga/min assist.  Initially attempting to hold IV pole, then progressing to 'furniture cruising'; increased sway bilat, mild/mod deviation with head turns and changes of direction/obstacle negotiation Additional 120' with RW, close sup-improved stepping pattern, cadence and overall stability; decreased sway, increased safety noted. Do recommend continued use of RW for all mobility tasks at this time.  Patient voiced understanding and has access to RW at home. Would benefit from skilled PT to address above deficits and promote optimal return to PLOF.; Recommend transition to HHPT upon discharge from acute hospitalization.     Follow Up Recommendations Home health PT    Equipment Recommendations       Recommendations for Other Services       Precautions / Restrictions Precautions Precautions: Fall Restrictions Weight Bearing Restrictions: No      Mobility  Bed Mobility Overal bed mobility: Modified Independent                  Transfers Overall transfer level: Needs assistance   Transfers: Sit to/from Stand Sit to Stand: Supervision;Min guard          General transfer comment: with and without RW, cga/close sup  Ambulation/Gait Ambulation/Gait assistance: Min guard;Min assist Gait Distance (Feet): 50 Feet Assistive device: None       General Gait Details: initially attempting to hold IV pole, then progressing to 'furniture cruising'; increased sway bilat, mild/mod deviation with head turns and changes of direction/obstacle negotiation  Stairs            Wheelchair Mobility    Modified Rankin (Stroke Patients Only)       Balance Overall balance assessment: Needs assistance Sitting-balance support: No upper extremity supported;Feet supported Sitting balance-Leahy Scale: Good     Standing balance support: No upper extremity supported Standing balance-Leahy Scale: Fair                               Pertinent Vitals/Pain Pain Assessment: No/denies pain    Home Living Family/patient expects to be discharged to:: Private residence Living Arrangements: Spouse/significant other Available Help at Discharge: Family Type of Home: Independent living facility Home Access: Level entry     Home Layout: One level Home Equipment: Grab bars - toilet;Grab bars - tub/shower      Prior Function Level of Independence: Independent         Comments: Indep with ADLs, household and community mobilization without assist device; does endorse 2 falls (from computer chair) in recent months.  Husband currently at healthcare center due to recent hip fracture.     Hand Dominance   Dominant Hand: Right    Extremity/Trunk Assessment   Upper Extremity Assessment  Upper Extremity Assessment: Overall WFL for tasks assessed (grossly 4+ to 5/5 throughout; no focal weakness, sensory or coordination deficit)    Lower Extremity Assessment Lower Extremity Assessment: Overall WFL for tasks assessed (grossly 4+ to 5/5 throughout; no focal weakness, sensory or coordination deficit)    Cervical / Trunk  Assessment Cervical / Trunk Assessment: Normal  Communication   Communication: No difficulties  Cognition Arousal/Alertness: Awake/alert Behavior During Therapy: WFL for tasks assessed/performed Overall Cognitive Status: Within Functional Limits for tasks assessed                                 General Comments: Alert and oriented to basic information; follows simple commands; intermittent confusion at times      General Comments      Exercises Other Exercises Other Exercises: 120' with RW, close sup-improved stepping pattern, cadence and overall stability; decreased sway, increased safety noted. Do recommend continued use of RW for all mobility tasks at this time.  Patient voiced understanding and has access to RW at home.   Assessment/Plan    PT Assessment Patient needs continued PT services  PT Problem List Decreased activity tolerance;Decreased balance;Decreased mobility;Decreased cognition;Decreased knowledge of use of DME;Decreased safety awareness;Decreased knowledge of precautions       PT Treatment Interventions Stair training;Functional mobility training;Gait training;DME instruction;Therapeutic activities;Therapeutic exercise;Balance training;Patient/family education    PT Goals (Current goals can be found in the Care Plan section)  Acute Rehab PT Goals Patient Stated Goal: to go home PT Goal Formulation: With patient Time For Goal Achievement: 02/06/21 Potential to Achieve Goals: Good    Frequency Min 2X/week   Barriers to discharge        Co-evaluation               AM-PAC PT "6 Clicks" Mobility  Outcome Measure Help needed turning from your back to your side while in a flat bed without using bedrails?: None Help needed moving from lying on your back to sitting on the side of a flat bed without using bedrails?: None Help needed moving to and from a bed to a chair (including a wheelchair)?: A Little Help needed standing up from a chair  using your arms (e.g., wheelchair or bedside chair)?: A Little Help needed to walk in hospital room?: A Little Help needed climbing 3-5 steps with a railing? : A Little 6 Click Score: 20    End of Session Equipment Utilized During Treatment: Gait belt Activity Tolerance: Patient tolerated treatment well Patient left: in bed;with call bell/phone within reach Nurse Communication: Mobility status PT Visit Diagnosis: Difficulty in walking, not elsewhere classified (R26.2);Other symptoms and signs involving the nervous system (R29.898)    Time: [id number] PT Time Calculation (min) (ACUTE ONLY): 19 min   Charges:   PT Evaluation $PT Eval Moderate Complexity: 1 Mod PT Treatments $Gait Training: 8-22 mins        Thedford Bunton H. Owens Shark, PT, DPT, NCS 01/23/21, 5:01 PM [phone]

## 2021-01-24 ENCOUNTER — Observation Stay: Payer: Medicare Other

## 2021-01-24 ENCOUNTER — Observation Stay
Admit: 2021-01-24 | Discharge: 2021-01-24 | Disposition: A | Discharge status: Home / Self Care | Payer: Medicare Other | Attending: Internal Medicine | Admitting: Internal Medicine

## 2021-01-24 DIAGNOSIS — I69320 Aphasia following cerebral infarction: Secondary | ICD-10-CM | POA: Diagnosis not present

## 2021-01-24 DIAGNOSIS — G459 Transient cerebral ischemic attack, unspecified: Secondary | ICD-10-CM | POA: Diagnosis not present

## 2021-01-24 LAB — ECHOCARDIOGRAM COMPLETE
AR max vel: 2.69 cm2
AV Area VTI: 2.91 cm2
AV Area mean vel: 2.81 cm2
AV Mean grad: 2 mmHg
AV Peak grad: 3.1 mmHg
Ao pk vel: 0.88 m/s
Area-P 1/2: 3.16 cm2
S' Lateral: 3 cm

## 2021-01-24 MED ORDER — ASPIRIN 81 MG PO TBEC: 81.0000 mg | DELAYED_RELEASE_TABLET | Freq: Every day | ORAL | 11 refills | Status: DC

## 2021-01-24 MED ORDER — ATORVASTATIN CALCIUM 40 MG PO TABS: 40.0000 mg | ORAL_TABLET | Freq: Every day | ORAL | 1 refills | Status: DC

## 2021-01-24 NOTE — TOC Initial Note (Signed)
Transition of Care [facility]) - Initial/Assessment Note    Patient Details  Name: Wendy Murray MRN: [id number] Date of Birth: 1935/10/17  Transition of [facility]) CM/SW Contact:    Shelbie Hutching, RN Phone Number: 01/24/2021, 4:06 PM  Clinical Narrative:                 Patient placed under observation for TIA.  RNCM met with patient at the bedside.  Patent reports that she is from [place] where she lives with her husband.  Patient reports being independent at home and not requiring any assistive devices.  She drives.  She is current with PCP.   PT is recommending HH and RW.  Patient agrees to PT and OT at [facility] ordered and delivered to the room by [employer].   When patient is medically ready for discharge RNCM will fax orders for PT and OT over to [facility] therapy department.  Patient does not have a ride home and will need [facility].    Expected Discharge Plan: [place] Barriers to Discharge: Continued Medical Work up   Patient Goals and CMS Choice Patient states their goals for this hospitalization and ongoing recovery are:: To go back to [place] living [employer]gov Compare Post Acute Care list provided to:: Patient Choice offered to / list presented to : Patient  Expected Discharge Plan and Services Expected Discharge Plan: [place]   Discharge Planning Services: CM Consult   Living arrangements for the past 2 months: [place] Expected Discharge Date: 01/24/21               DME Arranged: [place] Rile rolling DME Agency: [employer]       HH Arranged: PT, OT       Representative spoke with at [employer]: [facility]ie [facility]s PT and OT  Prior Living Arrangements/Services Living arrangements for the past 2 months: [place] Lives with:: Spouse Patient language and need for interpreter reviewed:: Yes Do you feel safe going back to the place where you live?:  Yes      Need for Family Participation in Patient Care: Yes (Comment) Care giver support system in place?: Yes (comment)   Criminal Activity/Legal Involvement Pertinent to Current Situation/Hospitalization: No - Comment as needed  Activities of Daily Living Home Assistive Devices/Equipment: None ADL Screening (condition at time of admission) Patient's cognitive ability adequate to safely complete daily activities?: Yes Is the patient deaf or have difficulty hearing?: No Does the patient have difficulty seeing, even when wearing glasses/contacts?: No Does the patient have difficulty concentrating, remembering, or making decisions?: No Patient able to express need for assistance with ADLs?: Yes Does the patient have difficulty dressing or bathing?: No Independently performs ADLs?: Yes (appropriate for developmental age) Does the patient have difficulty walking or climbing stairs?: No Weakness of Legs: None Weakness of Arms/Hands: None  Permission Sought/Granted Permission sought to share information with : Case Manager, Chartered certified accountant granted to share information with : Yes, Verbal Permission Granted     Permission granted to share info w AGENCY: [place]        Emotional Assessment Appearance:: Appears stated age Attitude/Demeanor/Rapport: Engaged Affect (typically observed): Accepting Orientation: : Oriented to Self, Oriented to Place, Oriented to  Time, Oriented to Situation Alcohol / Substance Use: Not Applicable Psych Involvement: No (comment)  Admission diagnosis:  TIA (transient ischemic attack) [G45.9] Patient Active Problem List  Diagnosis Date Noted   TIA (transient ischemic attack) 01/23/2021   GERD (gastroesophageal reflux disease) 12/16/2016   Hyperparathyroidism ([place])    Advance directive discussed with patient 12/13/2014   Routine general medical examination at a health care facility 12/12/2013   Hyperlipidemia    Recurrent  major depression ([place])    Hypothyroidism    Osteoarthritis, knee    IBS (irritable bowel syndrome)    PCP:  Venia Carbon, MD Pharmacy:   [facility] [id number], [place] [address] Phone: [phone] Fax: [phone]     Social Determinants of Health (SDOH) Interventions    Readmission Risk Interventions No flowsheet data found.

## 2021-01-24 NOTE — Consult Note (Signed)
NEURO HOSPITALIST CONSULT NOTE   Requestig physician: Dr. Reesa Chew  Reason for Consult: Transient spells of aphasia  History obtained from:   Patient and Chart     HPI:                                                                                                                                          Wendy Murray is an 85 y.o. 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 The longest spell lasted for about 8 minutes and consisted of garbled/unintelligible speech while speaking with her daughter on the telephone; her daughter had stated to her mother that she should be evaluated based on the quality of her speech during the phone call. The patient states that she could understand everything her daughter was saying, but had difficulty speaking and forming sentences. She states that the other instances were not really spells of aphasia, just momentary difficulty with finding one word, but also clearly abnormal relative to her baseline. CT head showed no acute abnormality. MRI brain revealed chronic microvascular changes without acute infarction. Carotid ultrasound with minimal ICA plaque bilaterally. Hospitalist team has started her on daily ASA and also started atorvastatin 40 mg po qd (was off statin for several years for an unknown reason).     Past Medical History:  Diagnosis Date   Diverticulosis    DVT (deep venous thrombosis) ([facility]) 2012   after TKR   Hyperlipidemia    Hyperparathyroidism ([facility])    Hypothyroidism    IBS (irritable bowel syndrome)    Inflammatory arthritis    Osteoarthritis, knee    Recurrent major depression ([place])    Past Surgical History:  Procedure Laterality Date   ABDOMINAL  HYSTERECTOMY  1991   CATARACT EXTRACTION W/ INTRAOCULAR LENS  IMPLANT, BILATERAL  2014   CHOLECYSTECTOMY  1998   DEXA  3/09   Normal   HAMMER TOE SURGERY  5/16   Dr Barkley Bruns   LUMBAR FUSION  1993   MENISCUS REPAIR Left 11/11   TOTAL KNEE ARTHROPLASTY Left 2012    Family History  Problem Relation Age of Onset   Cancer Mother        colon   Depression Mother    Cancer Father    Depression Sister    Hypertension Neg Hx    Heart disease Neg Hx    Diabetes Neg Hx             Social History:  reports that she quit smoking about 39 years ago. She has never used smokeless tobacco. She reports current  alcohol use. She reports that she does not use drugs.  No Known Allergies  MEDICATIONS:                                                                                                                     Prior to Admission:  Medications Prior to Admission  Medication Sig Dispense Refill Last Dose   levothyroxine (SYNTHROID) 75 MCG tablet TAKE ONE TABLET BY MOUTH DAILY BEFORE BREAKFAST 90 tablet 3 01/22/2021 at 0800   Multiple Vitamin (MULTIVITAMIN WITH MINERALS) TABS tablet Take 1 tablet by mouth daily.   01/22/2021   venlafaxine XR (EFFEXOR-XR) 150 MG 24 hr capsule Take 1 capsule (150 mg total) by mouth daily. Pt takes with a '75mg'$  capsule. 90 capsule 3 01/22/2021 at 0800   venlafaxine XR (EFFEXOR-XR) 75 MG 24 hr capsule Take 1 capsule (75 mg total) by mouth daily. Pt takes with a '150mg'$  capsule. 90 capsule 3 01/22/2021 at 0800   aspirin-acetaminophen-caffeine (EXCEDRIN MIGRAINE) 250-250-65 MG tablet Take 1 tablet by mouth every 6 (six) hours as needed for headache.   unknown at prn   clonazePAM (KLONOPIN) 1 MG tablet Take 1 tablet (1 mg total) by mouth at bedtime. 90 tablet 0 01/21/2021 at 2000   mirtazapine (REMERON) 15 MG tablet Take 1 tablet (15 mg total) by mouth at bedtime. 90 tablet 3 01/21/2021 at 2000   omeprazole (PRILOSEC) 20 MG capsule Take 20 mg by mouth daily.   unknown at prn   Probiotic  Product (ALIGN) 4 MG CAPS Take 1 capsule by mouth daily. (Patient not taking: No sig reported)   Not Taking   Scheduled:   stroke: mapping our early stages of recovery book   Does not apply Once   aspirin EC  81 mg Oral Daily   atorvastatin  40 mg Oral Daily   clonazePAM  1 mg Oral QHS   enoxaparin (LOVENOX) injection  40 mg Subcutaneous Q24H   levothyroxine  75 mcg Oral Q0600   mirtazapine  15 mg Oral QHS   pantoprazole  40 mg Oral Daily   sodium chloride flush  3 mL Intravenous Once   venlafaxine XR  150 mg Oral Q breakfast   And   venlafaxine XR  75 mg Oral Q breakfast     ROS:  No associated weakness or numbness with her transient aphasia. Other ROS as per HPI.   Blood pressure (!) 147/80, pulse 79, temperature 97.6 F (36.4 C), resp. rate 18, height '5\' 5"'$  (1.651 m), weight 79.4 kg, SpO2 93 %.   General Examination:                                                                                                       Physical Exam  HEENT-  [place]/AT   Lungs- Respirations unlabored Extremities- No edema  Neurological Examination Mental Status: Awake and alert. Fully oriented. Speech is fluent without dysarthria. Naming and repetition intact. Comprehension intact; able to follow 3 step commands without difficulty. Cranial Nerves: II: Temporal visual fields intact with no extinction to DSS. PERRL.  III,IV, VI: No ptosis. EOMI. No nystagmus.  V: Facial temp sensation is equal.  VII: Smile symmetric VIII: Hearing intact to voice IX,X: No hypophonia XI: Symmetric XII: Midline tongue extension Motor: Right : Upper extremity   5/5    Left:     Upper extremity   5/5  Lower extremity   5/5     Lower extremity   5/5 No pronator drift Sensory: Temp and light touch intact throughout, bilaterally Deep Tendon Reflexes: 2+ and symmetric BUE (deferred  patellar reflexes due to knee pain) Plantars: Right: Downgoing   Left: Mute Cerebellar: No ataxia with FNF bilaterally  Gait: Deferred   Lab Results: Basic Metabolic Panel: Recent Labs  Lab 01/22/21 1734 01/23/21 0252  NA 139  --   K 3.7  --   CL 104  --   CO2 28  --   GLUCOSE 107*  --   BUN 19  --   CREATININE 0.74 0.81  CALCIUM 10.4*  --     CBC: Recent Labs  Lab 01/22/21 1734 01/23/21 0252  WBC 8.6 11.2*  NEUTROABS 5.2  --   HGB 13.4 13.6  HCT 41.9 40.9  MCV 84.8 86.8  PLT 363 379    Cardiac Enzymes: No results for input(s): CKTOTAL, CKMB, CKMBINDEX, TROPONINI in the last 168 hours.  Lipid Panel: Recent Labs  Lab 01/23/21 0252  CHOL 258*  TRIG 203*  HDL 45  CHOLHDL 5.7  VLDL 41*  LDLCALC 172*    Imaging: CT HEAD WO CONTRAST  Result Date: 01/22/2021 CLINICAL DATA:  Neuro deficit, acute, stroke suspected aphasia. EXAM: CT HEAD WITHOUT CONTRAST TECHNIQUE: Contiguous axial images were obtained from the base of the skull through the vertex without intravenous contrast. COMPARISON:  None. FINDINGS: Brain: There is atrophy and chronic small vessel disease changes. No acute intracranial abnormality. Specifically, no hemorrhage, hydrocephalus, mass lesion, acute infarction, or significant intracranial injury. Vascular: No hyperdense vessel or unexpected calcification. Skull: No acute calvarial abnormality. Sinuses/Orbits: No acute findings Other: None IMPRESSION: Atrophy, chronic microvascular disease. No acute intracranial abnormality. Electronically Signed   By: Rolm Baptise M.D.   On: 01/22/2021 18:31   MR BRAIN WO CONTRAST  Result Date: 01/23/2021 CLINICAL DATA:  Initial evaluation for acute TIA. EXAM: MRI HEAD WITHOUT CONTRAST TECHNIQUE: Multiplanar, multiecho pulse  sequences of the brain and surrounding structures were obtained without intravenous contrast. COMPARISON:  Prior CT from 01/22/2021 and MRI from 06/12/2015. FINDINGS: Brain: Examination mildly  degraded by motion artifact. Generalized age-related cerebral atrophy. Patchy and confluent T2/FLAIR hyperintensity within the periventricular and deep white matter both cerebral hemispheres most consistent with chronic small vessel ischemic disease, moderate in nature. No convincing foci of restricted diffusion to suggest acute or subacute ischemia. Subtle apparent diffusion signal abnormality along the margin of the splenium on axial DWI sequence noted, not seen on corresponding sequences, favored to be artifactual in nature. Gray-white matter differentiation maintained. No encephalomalacia to suggest chronic cortical infarction. No foci of susceptibility artifact to suggest acute or chronic intracranial hemorrhage. 1 cm calcified meningioma at the posterior aspect of the foramen magnum without mass effect, similar to previous (series 9, image 2). No other mass lesion, mass effect or midline shift. Mild ventricular prominence related to global parenchymal volume loss without hydrocephalus. No extra-axial fluid collection. Pituitary gland suprasellar region within normal limits. Midline structures intact. Vascular: Major intracranial vascular flow voids are maintained. Skull and upper cervical spine: Craniocervical junction otherwise unremarkable. Bone marrow signal intensity within normal limits. No scalp soft tissue abnormality. Sinuses/Orbits: Patient status post bilateral ocular lens replacement. Globes and orbital soft tissues demonstrate no acute finding. Mild scattered mucosal thickening noted within the ethmoidal air cells and maxillary sinuses. Paranasal sinuses are otherwise clear. No mastoid effusion. Inner ear structures grossly normal. Other: None. IMPRESSION: 1. No acute intracranial abnormality. 2. Generalized age-related cerebral atrophy with moderate chronic microvascular ischemic disease. 3. 1 cm calcified meningioma at the posterior aspect of the foramen magnum without mass affect. Electronically  Signed   By: Jeannine Boga M.D.   On: 01/23/2021 03:49   US Carotid Bilateral (at [facility] and [facility] only)  Result Date: 01/23/2021 CLINICAL DATA:  85 year old female with a history of TIA EXAM: BILATERAL CAROTID DUPLEX ULTRASOUND TECHNIQUE: Pearline Cables scale imaging, color Doppler and duplex ultrasound were performed of bilateral carotid and vertebral arteries in the neck. COMPARISON:  None. FINDINGS: Criteria: Quantification of carotid stenosis is based on velocity parameters that correlate the residual internal carotid diameter with NASCET-based stenosis levels, using the diameter of the distal internal carotid lumen as the denominator for stenosis measurement. The following velocity measurements were obtained: RIGHT ICA:  Systolic XX123456 cm/sec, Diastolic 19 cm/sec CCA:  123456 cm/sec SYSTOLIC ICA/CCA RATIO:  1.8 ECA:  120 cm/sec LEFT ICA:  Systolic 71 cm/sec, Diastolic 15 cm/sec CCA:  86 cm/sec SYSTOLIC ICA/CCA RATIO:  1.2 ECA:  61 cm/sec Right Brachial SBP: Not acquired Left Brachial SBP: Not acquired RIGHT CAROTID ARTERY: No significant calcified disease of the right common carotid artery. Intermediate waveform maintained. Heterogeneous plaque without significant calcifications at the right carotid bifurcation. Low resistance waveform of the right ICA. No significant tortuosity. RIGHT VERTEBRAL ARTERY: Antegrade flow with low resistance waveform. LEFT CAROTID ARTERY: No significant calcified disease of the left common carotid artery. Intermediate waveform maintained. Heterogeneous plaque at the left carotid bifurcation without significant calcifications. Low resistance waveform of the left ICA. LEFT VERTEBRAL ARTERY:  Antegrade flow with low resistance waveform. IMPRESSION: Color duplex indicates minimal heterogeneous plaque, with no hemodynamically significant stenosis by duplex criteria in the extracranial cerebrovascular circulation. Signed, Dulcy Fanny. Dellia Nims, RPVI [facility] Electronically Signed   By: Corrie Mckusick D.O.   On: 01/23/2021 08:08     Assessment: 85 year old female presenting with spells of transient  aphasia. Symptoms described by the patient are most consistent with an expressive aphasia.  1. Exam reveals no focal deficits. Detailed assessment of her speech reveals no evidence of aphasia at the time of the exam.  2. MRI brain: No acute intracranial abnormality. Generalized age-related cerebral atrophy with moderate chronic microvascular ischemic disease. 1 cm calcified meningioma at the posterior aspect of the foramen magnum without mass affect. 3. Carotid ultrasound: Minimal heterogeneous plaque, with no hemodynamically significant stenosis by duplex criteria in the extracranial cerebrovascular circulation. 4. Stroke risk factors: DVT and HLD    Recommendations: 1. MRA head will be needed to complete the stroke work up (ordered).  2. TTE report is pending.  3. Continue ASA and atorvastatin.  4. BP management. Out of the permissive HTN time window.  5. Frequent neuro checks.  6. Cardiac telemetry.    Electronically signed: Dr. Kerney Elbe 01/24/2021, 8:19 AM

## 2021-01-24 NOTE — Discharge Summary (Signed)
Physician Discharge Summary  Wendy Murray [id number] DOB: [birth year] DOA: 01/23/2021  PCP: Venia Carbon, MD  Admit date: 01/23/2021 Discharge date: 01/24/2021  Admitted From: Home Disposition: Home  Recommendations for Outpatient Follow-up:  Follow up with PCP in 1-2 weeks Please obtain BMP/CBC in one week Please follow up on the following pending results: Echocardiogram final results  Home Health: No Equipment/Devices: None Discharge Condition: Stable CODE STATUS: Full Diet recommendation: Heart Healthy   Brief/Interim Summary: Wendy Murray is a 85 y.o. female who at baseline lives independently and with medical history significant for Hypothyroidism, osteoarthritis, depression, IBS, who presents to the ED for evaluation of 5 episodes during the day of difficulty word finding and articulating her speech with the longest episode lasting up to 8 minutes.     CT head with chronic microvascular disease and no acute abnormality.  MRI head was also negative for any acute abnormality. MRA without any significant stenosis. Carotid ultrasound with nonsignificant atherosclerosis.   Lipid panel with elevated LDL and cholesterol.  Apparently patient was on statin and was taken off many years ago.  Could not remember why.  A1c of 6.1 which makes her prediabetic.  PT is recommending outpatient follow-up-referral was provided.   Admitted for TIA work-up.Neurology was also consulted.They started her on aspirin and statin. Echocardiogram done, final results are pending.  Clinically no significant concerns.  She will continue rest of her home medications and follow-up with her providers for further recommendations.  Discharge Diagnoses:  Principal Problem:   TIA (transient ischemic attack) Active Problems:   Hypothyroidism   IBS (irritable bowel syndrome)   Discharge Instructions  Discharge Instructions     Ambulatory referral to Physical Medicine Rehab   Complete by:  As directed    Diet - low sodium heart healthy   Complete by: As directed    Discharge instructions   Complete by: As directed    It was pleasure taking care of you. You are being started on baby aspirin and Lipitor as recommended by your neurologist. You are also being given referral to get physical therapy as an outpatient. Please keep yourself well-hydrated and follow-up with your doctors.   Increase activity slowly   Complete by: As directed       Allergies as of 01/24/2021   No Known Allergies      Medication List     TAKE these medications    aspirin 81 MG EC tablet Take 1 tablet (81 mg total) by mouth daily. Swallow whole. Start taking on: January 25, 2021   aspirin-acetaminophen-caffeine 250-250-65 MG tablet Commonly known as: EXCEDRIN MIGRAINE Take 1 tablet by mouth every 6 (six) hours as needed for headache.   atorvastatin 40 MG tablet Commonly known as: LIPITOR Take 1 tablet (40 mg total) by mouth daily. Start taking on: January 25, 2021   clonazePAM 1 MG tablet Commonly known as: KLONOPIN Take 1 tablet (1 mg total) by mouth at bedtime.   levothyroxine 75 MCG tablet Commonly known as: SYNTHROID TAKE ONE TABLET BY MOUTH DAILY BEFORE BREAKFAST   mirtazapine 15 MG tablet Commonly known as: REMERON Take 1 tablet (15 mg total) by mouth at bedtime.   multivitamin with minerals Tabs tablet Take 1 tablet by mouth daily.   omeprazole 20 MG capsule Commonly known as: PRILOSEC Take 20 mg by mouth daily.   venlafaxine XR 150 MG 24 hr capsule Commonly known as: EFFEXOR-XR Take 1 capsule (150 mg total) by mouth daily. Pt takes with a  $'75mg'y$  capsule.   venlafaxine XR 75 MG 24 hr capsule Commonly known as: EFFEXOR-XR Take 1 capsule (75 mg total) by mouth daily. Pt takes with a '150mg'$  capsule.       ASK your doctor about these medications    Align 4 MG Caps Take 1 capsule by mouth daily.               Durable Medical Equipment  (From admission,  onward)           Start     Ordered   01/24/21 1332  For home use only DME Walker rolling  Once       Question Answer Comment  Walker: With 5 Inch Wheels   Patient needs a walker to treat with the following condition Weakness      01/24/21 1332            Follow-up Information     Venia Carbon, MD. Schedule an appointment as soon as possible for a visit in 1 week(s).   Specialties: Internal Medicine, Pediatrics Contact information: [address] [place] [address] [phone]                No Known Allergies  Consultations: Neurology  Procedures/Studies: CT HEAD WO CONTRAST  Result Date: 01/22/2021 CLINICAL DATA:  Neuro deficit, acute, stroke suspected aphasia. EXAM: CT HEAD WITHOUT CONTRAST TECHNIQUE: Contiguous axial images were obtained from the base of the skull through the vertex without intravenous contrast. COMPARISON:  None. FINDINGS: Brain: There is atrophy and chronic small vessel disease changes. No acute intracranial abnormality. Specifically, no hemorrhage, hydrocephalus, mass lesion, acute infarction, or significant intracranial injury. Vascular: No hyperdense vessel or unexpected calcification. Skull: No acute calvarial abnormality. Sinuses/Orbits: No acute findings Other: None IMPRESSION: Atrophy, chronic microvascular disease. No acute intracranial abnormality. Electronically Signed   By: Rolm Baptise M.D.   On: 01/22/2021 18:31   MR ANGIO HEAD WO CONTRAST  Result Date: 01/24/2021 CLINICAL DATA:  Transient aphasia, stroke follow-up EXAM: MRA HEAD WITHOUT CONTRAST TECHNIQUE: Angiographic images of the Circle of Willis were acquired using MRA technique without intravenous contrast. COMPARISON:  No prior MRA, correlation is made with MRI 01/23/2021 FINDINGS: Anterior circulation: No significant stenosis in the intracranial ICAs, MCAs, or ACAs. Posterior circulation: No significant stenosis in the basilar artery, PCA, and vertebral  arteries. The proximal SCAs and PICAs are visualized. Left dominant vertebral artery system. Anatomic variants: Fetal origin of the right PCA. The left posterior communicating artery is visualized. Other: None. IMPRESSION: Normal MRI of the head. Electronically Signed   By: Merilyn Baba MD   On: 01/24/2021 12:44   MR BRAIN WO CONTRAST  Result Date: 01/23/2021 CLINICAL DATA:  Initial evaluation for acute TIA. EXAM: MRI HEAD WITHOUT CONTRAST TECHNIQUE: Multiplanar, multiecho pulse sequences of the brain and surrounding structures were obtained without intravenous contrast. COMPARISON:  Prior CT from 01/22/2021 and MRI from 06/12/2015. FINDINGS: Brain: Examination mildly degraded by motion artifact. Generalized age-related cerebral atrophy. Patchy and confluent T2/FLAIR hyperintensity within the periventricular and deep white matter both cerebral hemispheres most consistent with chronic small vessel ischemic disease, moderate in nature. No convincing foci of restricted diffusion to suggest acute or subacute ischemia. Subtle apparent diffusion signal abnormality along the margin of the splenium on axial DWI sequence noted, not seen on corresponding sequences, favored to be artifactual in nature. Gray-white matter differentiation maintained. No encephalomalacia to suggest chronic cortical infarction. No foci of susceptibility artifact to suggest acute or chronic  intracranial hemorrhage. 1 cm calcified meningioma at the posterior aspect of the foramen magnum without mass effect, similar to previous (series 9, image 2). No other mass lesion, mass effect or midline shift. Mild ventricular prominence related to global parenchymal volume loss without hydrocephalus. No extra-axial fluid collection. Pituitary gland suprasellar region within normal limits. Midline structures intact. Vascular: Major intracranial vascular flow voids are maintained. Skull and upper cervical spine: Craniocervical junction otherwise  unremarkable. Bone marrow signal intensity within normal limits. No scalp soft tissue abnormality. Sinuses/Orbits: Patient status post bilateral ocular lens replacement. Globes and orbital soft tissues demonstrate no acute finding. Mild scattered mucosal thickening noted within the ethmoidal air cells and maxillary sinuses. Paranasal sinuses are otherwise clear. No mastoid effusion. Inner ear structures grossly normal. Other: None. IMPRESSION: 1. No acute intracranial abnormality. 2. Generalized age-related cerebral atrophy with moderate chronic microvascular ischemic disease. 3. 1 cm calcified meningioma at the posterior aspect of the foramen magnum without mass affect. Electronically Signed   By: Jeannine Boga M.D.   On: 01/23/2021 03:49   US Carotid Bilateral (at [facility] and [facility] only)  Result Date: 01/23/2021 CLINICAL DATA:  85 year old female with a history of TIA EXAM: BILATERAL CAROTID DUPLEX ULTRASOUND TECHNIQUE: Pearline Cables scale imaging, color Doppler and duplex ultrasound were performed of bilateral carotid and vertebral arteries in the neck. COMPARISON:  None. FINDINGS: Criteria: Quantification of carotid stenosis is based on velocity parameters that correlate the residual internal carotid diameter with NASCET-based stenosis levels, using the diameter of the distal internal carotid lumen as the denominator for stenosis measurement. The following velocity measurements were obtained: RIGHT ICA:  Systolic XX123456 cm/sec, Diastolic 19 cm/sec CCA:  123456 cm/sec SYSTOLIC ICA/CCA RATIO:  1.8 ECA:  120 cm/sec LEFT ICA:  Systolic 71 cm/sec, Diastolic 15 cm/sec CCA:  86 cm/sec SYSTOLIC ICA/CCA RATIO:  1.2 ECA:  61 cm/sec Right Brachial SBP: Not acquired Left Brachial SBP: Not acquired RIGHT CAROTID ARTERY: No significant calcified disease of the right common carotid artery. Intermediate waveform maintained. Heterogeneous plaque without significant calcifications at the right carotid bifurcation. Low resistance waveform  of the right ICA. No significant tortuosity. RIGHT VERTEBRAL ARTERY: Antegrade flow with low resistance waveform. LEFT CAROTID ARTERY: No significant calcified disease of the left common carotid artery. Intermediate waveform maintained. Heterogeneous plaque at the left carotid bifurcation without significant calcifications. Low resistance waveform of the left ICA. LEFT VERTEBRAL ARTERY:  Antegrade flow with low resistance waveform. IMPRESSION: Color duplex indicates minimal heterogeneous plaque, with no hemodynamically significant stenosis by duplex criteria in the extracranial cerebrovascular circulation. Signed, Dulcy Fanny. Dellia Nims, RPVI [facility] Electronically Signed   By: Corrie Mckusick D.O.   On: 01/23/2021 08:08    Subjective: Patient was seen and examined today.  No new complaints.  She appears at her baseline.  Had her MRA and echocardiogram done earlier in the day.  Was seen by neurologist.  Wants to go home.  Discharge Exam: Vitals:   01/24/21 0804 01/24/21 1115  BP: (!) 147/80 (!) 157/80  Pulse: 79 84  Resp: 18 18  Temp: 97.6 F (36.4 C) 98.7 F (37.1 C)  SpO2: 93% 92%   Vitals:   01/23/21 1937 01/24/21 0533 01/24/21 0804 01/24/21 1115  BP: (!) 151/74 (!) 149/88 (!) 147/80 (!) 157/80  Pulse: 90 91 79 84  Resp: '16 18 18 18  '$ Temp: 98 F (36.7 C) 98.6 F (37 C) 97.6 F (36.4 C) 98.7 F (37.1 C)  TempSrc: Oral  Oral    SpO2: 95% 93% 93% 92%  Weight:      Height:        General: Pt is alert, awake, not in acute distress Cardiovascular: RRR, S1/S2 +, no rubs, no gallops Respiratory: CTA bilaterally, no wheezing, no rhonchi Abdominal: Soft, NT, ND, bowel sounds + Extremities: no edema, no cyanosis   The results of significant diagnostics from this hospitalization (including imaging, microbiology, ancillary and laboratory) are listed below for reference.    Microbiology: Recent Results (from the past 240 hour(s))   Resp Panel by RT-PCR (Flu A&B, Covid) Nasopharyngeal Swab     Status: None   Collection Time: 01/23/21  2:08 AM   Specimen: Nasopharyngeal Swab; Nasopharyngeal(NP) swabs in vial transport medium  Result Value Ref Range Status   SARS Coronavirus 2 by RT PCR NEGATIVE NEGATIVE Final    Comment: (NOTE) SARS-CoV-2 target nucleic acids are NOT DETECTED.  The SARS-CoV-2 RNA is generally detectable in upper respiratory specimens during the acute phase of infection. The lowest concentration of SARS-CoV-2 viral copies this assay can detect is 138 copies/mL. A negative result does not preclude SARS-Cov-2 infection and should not be used as the sole basis for treatment or other patient management decisions. A negative result may occur with  improper specimen collection/handling, submission of specimen other than nasopharyngeal swab, presence of viral mutation(s) within the areas targeted by this assay, and inadequate number of viral copies(<138 copies/mL). A negative result must be combined with clinical observations, patient history, and epidemiological information. The expected result is Negative.  Fact Sheet for Patients:  EntrepreneurPulse.com.au  Fact Sheet for Healthcare Providers:  IncredibleEmployment.be  This test is no t yet approved or cleared by the [place] FDA and  has been authorized for detection and/or diagnosis of SARS-CoV-2 by FDA under an Emergency Use Authorization (EUA). This EUA will remain  in effect (meaning this test can be used) for the duration of the COVID-19 declaration under Section 564(b)(1) of the Act, 21 U.S.C.section 360bbb-3(b)(1), unless the authorization is terminated  or revoked sooner.       Influenza A by PCR NEGATIVE NEGATIVE Final   Influenza B by PCR NEGATIVE NEGATIVE Final    Comment: (NOTE) The Xpert Xpress SARS-CoV-2/FLU/RSV plus assay is intended as an aid in the diagnosis of influenza from  Nasopharyngeal swab specimens and should not be used as a sole basis for treatment. Nasal washings and aspirates are unacceptable for Xpert Xpress SARS-CoV-2/FLU/RSV testing.  Fact Sheet for Patients: EntrepreneurPulse.com.au  Fact Sheet for Healthcare Providers: IncredibleEmployment.be  This test is not yet approved or cleared by the [place] FDA and has been authorized for detection and/or diagnosis of SARS-CoV-2 by FDA under an Emergency Use Authorization (EUA). This EUA will remain in effect (meaning this test can be used) for the duration of the COVID-19 declaration under Section 564(b)(1) of the Act, 21 U.S.C. section 360bbb-3(b)(1), unless the authorization is terminated or revoked.  Performed at [facility], [address] [place], [place] [address]      Labs: BNP (last 3 results) No results for input(s): BNP in the last 8760 hours. Basic Metabolic Panel: Recent Labs  Lab 01/22/21 1734 01/23/21 0252  NA 139  --   K 3.7  --   CL 104  --   CO2 28  --   GLUCOSE 107*  --   BUN 19  --   CREATININE 0.74 0.81  CALCIUM 10.4*  --    Liver Function Tests: Recent  Labs  Lab 01/22/21 1734  AST 20  ALT 24  ALKPHOS 107  BILITOT 0.5  PROT 7.1  ALBUMIN 4.1   No results for input(s): LIPASE, AMYLASE in the last 168 hours. No results for input(s): AMMONIA in the last 168 hours. CBC: Recent Labs  Lab 01/22/21 1734 01/23/21 0252  WBC 8.6 11.2*  NEUTROABS 5.2  --   HGB 13.4 13.6  HCT 41.9 40.9  MCV 84.8 86.8  PLT 363 379   Cardiac Enzymes: No results for input(s): CKTOTAL, CKMB, CKMBINDEX, TROPONINI in the last 168 hours. BNP: Invalid input(s): POCBNP CBG: Recent Labs  Lab 01/22/21 1732  GLUCAP 102*   D-Dimer No results for input(s): DDIMER in the last 72 hours. Hgb A1c Recent Labs    01/23/21 0252  HGBA1C 6.1*   Lipid Profile Recent Labs    01/23/21 0252  CHOL 258*  HDL 45  LDLCALC 172*   TRIG 203*  CHOLHDL 5.7   Thyroid function studies No results for input(s): TSH, T4TOTAL, T3FREE, THYROIDAB in the last 72 hours.  Invalid input(s): FREET3 Anemia work up No results for input(s): VITAMINB12, FOLATE, FERRITIN, TIBC, IRON, RETICCTPCT in the last 72 hours. Urinalysis    Component Value Date/Time   COLORURINE YELLOW (A) 06/10/2015 1010   APPEARANCEUR CLEAR (A) 06/10/2015 1010   LABSPEC 1.006 06/10/2015 1010   PHURINE 6.0 06/10/2015 1010   GLUCOSEU NEGATIVE 06/10/2015 1010   HGBUR 1+ (A) 06/10/2015 1010   BILIRUBINUR NEGATIVE 06/10/2015 1010   KETONESUR NEGATIVE 06/10/2015 1010   PROTEINUR NEGATIVE 06/10/2015 1010   NITRITE NEGATIVE 06/10/2015 1010   LEUKOCYTESUR NEGATIVE 06/10/2015 1010   Sepsis Labs Invalid input(s): PROCALCITONIN,  WBC,  LACTICIDVEN Microbiology Recent Results (from the past 240 hour(s))  Resp Panel by RT-PCR (Flu A&B, Covid) Nasopharyngeal Swab     Status: None   Collection Time: 01/23/21  2:08 AM   Specimen: Nasopharyngeal Swab; Nasopharyngeal(NP) swabs in vial transport medium  Result Value Ref Range Status   SARS Coronavirus 2 by RT PCR NEGATIVE NEGATIVE Final    Comment: (NOTE) SARS-CoV-2 target nucleic acids are NOT DETECTED.  The SARS-CoV-2 RNA is generally detectable in upper respiratory specimens during the acute phase of infection. The lowest concentration of SARS-CoV-2 viral copies this assay can detect is 138 copies/mL. A negative result does not preclude SARS-Cov-2 infection and should not be used as the sole basis for treatment or other patient management decisions. A negative result may occur with  improper specimen collection/handling, submission of specimen other than nasopharyngeal swab, presence of viral mutation(s) within the areas targeted by this assay, and inadequate number of viral copies(<138 copies/mL). A negative result must be combined with clinical observations, patient history, and  epidemiological information. The expected result is Negative.  Fact Sheet for Patients:  EntrepreneurPulse.com.au  Fact Sheet for Healthcare Providers:  IncredibleEmployment.be  This test is no t yet approved or cleared by the [place] FDA and  has been authorized for detection and/or diagnosis of SARS-CoV-2 by FDA under an Emergency Use Authorization (EUA). This EUA will remain  in effect (meaning this test can be used) for the duration of the COVID-19 declaration under Section 564(b)(1) of the Act, 21 U.S.C.section 360bbb-3(b)(1), unless the authorization is terminated  or revoked sooner.       Influenza A by PCR NEGATIVE NEGATIVE Final   Influenza B by PCR NEGATIVE NEGATIVE Final    Comment: (NOTE) The Xpert Xpress SARS-CoV-2/FLU/RSV plus assay is intended as an aid in the  diagnosis of influenza from Nasopharyngeal swab specimens and should not be used as a sole basis for treatment. Nasal washings and aspirates are unacceptable for Xpert Xpress SARS-CoV-2/FLU/RSV testing.  Fact Sheet for Patients: EntrepreneurPulse.com.au  Fact Sheet for Healthcare Providers: IncredibleEmployment.be  This test is not yet approved or cleared by the [place] FDA and has been authorized for detection and/or diagnosis of SARS-CoV-2 by FDA under an Emergency Use Authorization (EUA). This EUA will remain in effect (meaning this test can be used) for the duration of the COVID-19 declaration under Section 564(b)(1) of the Act, 21 U.S.C. section 360bbb-3(b)(1), unless the authorization is terminated or revoked.  Performed at [facility], [address] [place], [place] [address]     Time coordinating discharge: Over 30 minutes  SIGNED:  Lorella Nimrod, MD  [facility] 01/24/2021, 3:24 PM  If 7PM-7AM, please contact night-coverage [email or web address]  This record has been created using Actor. Errors have been sought and corrected,but may not always be located. Such creation errors do not reflect on the standard of care.

## 2021-01-24 NOTE — TOC Transition Note (Signed)
Transition of Care [facility]) - CM/SW Discharge Note   Patient Details  Name: Wendy Murray MRN: [id number] Date of Birth: Oct 15, 1935  [facility] CM/SW Contact:  Shelbie Hutching, RN Phone Number: 01/24/2021, 4:45 PM   Clinical Narrative:    Patient discharging home.  [facility] has been arranged and will be here in the next 5-10 minutes.       Barriers to Discharge: Continued Medical Work up   Patient Goals and CMS Choice Patient states their goals for this hospitalization and ongoing recovery are:: To go back to [place] living [employer].gov Compare Post Acute Care list provided to:: Patient Choice offered to / list presented to : Patient  Discharge Placement                       Discharge Plan and Services   Discharge Planning Services: CM Consult            DME Arranged: [place] Rile rolling DME Agency: [employer]       HH Arranged: PT, OT       Representative spoke with at [facility]  Social Determinants of Health ([place]) Interventions     Readmission Risk Interventions No flowsheet data found.

## 2021-01-24 NOTE — Progress Notes (Signed)
*  PRELIMINARY RESULTS* Echocardiogram 2D Echocardiogram has been performed.  Sherrie Sport 01/24/2021, 9:30 AM

## 2021-01-24 NOTE — Care Management Obs Status (Signed)
Portal NOTIFICATION   Patient Details  Name: Wendy Murray MRN: [id number] Date of Birth: 1935/10/02   Medicare Observation Status Notification Given:  Yes    Shelbie Hutching, RN 01/24/2021, 11:10 AM

## 2021-01-26 ENCOUNTER — Other Ambulatory Visit: Payer: Self-pay | Admitting: Family

## 2021-01-27 ENCOUNTER — Encounter: Payer: Self-pay | Admitting: Family

## 2021-01-27 ENCOUNTER — Telehealth: Payer: Medicare Other | Admitting: Family

## 2021-01-27 DIAGNOSIS — M069 Rheumatoid arthritis, unspecified: Secondary | ICD-10-CM | POA: Diagnosis not present

## 2021-01-27 MED ORDER — PREDNISONE 10 MG (21) PO TBPK: ORAL_TABLET | ORAL | 0 refills | Status: DC

## 2021-01-27 NOTE — Progress Notes (Signed)
Virtual Visit Consent   RICHARDA PERELMAN, you are scheduled for a virtual visit with a [facility] provider today.     Just as with appointments in the office, your consent must be obtained to participate.  Your consent will be active for this visit and any virtual visit you may have with one of our providers in the next 365 days.     If you have a MyChart account, a copy of this consent can be sent to you electronically.  All virtual visits are billed to your insurance company just like a traditional visit in the office.    As this is a virtual visit, video technology does not allow for your provider to perform a traditional examination.  This may limit your provider's ability to fully assess your condition.  If your provider identifies any concerns that need to be evaluated in person or the need to arrange testing (such as labs, EKG, etc.), we will make arrangements to do so.     Although advances in technology are sophisticated, we cannot ensure that it will always work on either your end or our end.  If the connection with a video visit is poor, the visit may have to be switched to a telephone visit.  With either a video or telephone visit, we are not always able to ensure that we have a secure connection.     I need to obtain your verbal consent now.   Are you willing to proceed with your visit today?    SHANNYN N[place] has provided verbal consent on 01/27/2021 for a virtual visit (video or telephone).   Evelina Dun, FNP   Date: 01/27/2021 10:35 AM   Virtual Visit via Video Note   I, Evelina Dun, connected with  CAROLAN LIFE  ([id number], 1935-09-18) on 01/27/21 at 10:30 AM EDT by a video-enabled telemedicine application and verified that I am speaking with the correct person using two identifiers.  Location: Patient: Virtual Visit Location Patient: Home Provider: Virtual Visit Location Provider: Home   I discussed the limitations of evaluation and management by  telemedicine and the availability of in person appointments. The patient expressed understanding and agreed to proceed.    History of Present Illness: Wendy Murray is a 85 y.o. who identifies as a female who was assigned female at birth, and is being seen today for arthritis flare up. She reports she woke up Thursday morning with neck pain, bilateral elbow pain, bilateral hip, and foot pain.   HPI: Arthritis Presents for follow-up visit. She complains of pain and stiffness. The symptoms have been worsening. Affected locations include the left shoulder, right shoulder, left elbow, right elbow, right wrist, left wrist, right MCP, left MCP, right ankle, left hip, right hip, left foot and right foot. Her pain is at a severity of 9/10.   Problems:  Patient Active Problem List   Diagnosis Date Noted   TIA (transient ischemic attack) 01/23/2021   GERD (gastroesophageal reflux disease) 12/16/2016   Hyperparathyroidism ([place] [facility])    Advance directive discussed with patient 12/13/2014   Routine general medical examination at a health care facility 12/12/2013   Hyperlipidemia    Recurrent major depression (HCC)    Hypothyroidism    Osteoarthritis, knee    IBS (irritable bowel syndrome)     Allergies: No Known Allergies Medications:  Current Outpatient Medications:    predniSONE (STERAPRED UNI-PAK 21 TAB) 10 MG (21) TBPK tablet, Use as directed, Disp: 21 tablet, Rfl: 0  aspirin EC 81 MG EC tablet, Take 1 tablet (81 mg total) by mouth daily. Swallow whole., Disp: 30 tablet, Rfl: 11   aspirin-acetaminophen-caffeine (EXCEDRIN MIGRAINE) 250-250-65 MG tablet, Take 1 tablet by mouth every 6 (six) hours as needed for headache., Disp: , Rfl:    atorvastatin (LIPITOR) 40 MG tablet, Take 1 tablet (40 mg total) by mouth daily., Disp: 90 tablet, Rfl: 1   clonazePAM (KLONOPIN) 1 MG tablet, Take 1 tablet (1 mg total) by mouth at bedtime., Disp: 90 tablet, Rfl: 0   levothyroxine (SYNTHROID) 75 MCG tablet, TAKE  ONE TABLET BY MOUTH DAILY BEFORE BREAKFAST, Disp: 90 tablet, Rfl: 3   mirtazapine (REMERON) 15 MG tablet, Take 1 tablet (15 mg total) by mouth at bedtime., Disp: 90 tablet, Rfl: 3   Multiple Vitamin (MULTIVITAMIN WITH MINERALS) TABS tablet, Take 1 tablet by mouth daily., Disp: , Rfl:    omeprazole (PRILOSEC) 20 MG capsule, Take 20 mg by mouth daily., Disp: , Rfl:    Probiotic Product (ALIGN) 4 MG CAPS, Take 1 capsule by mouth daily. (Patient not taking: No sig reported), Disp: , Rfl:    venlafaxine XR (EFFEXOR-XR) 150 MG 24 hr capsule, Take 1 capsule (150 mg total) by mouth daily. Pt takes with a '75mg'$  capsule., Disp: 90 capsule, Rfl: 3   venlafaxine XR (EFFEXOR-XR) 75 MG 24 hr capsule, Take 1 capsule (75 mg total) by mouth daily. Pt takes with a '150mg'$  capsule., Disp: 90 capsule, Rfl: 3  Observations/Objective: Patient is well-developed, well-nourished in no acute distress.  Resting comfortably  at home.  Head is normocephalic, atraumatic.  No labored breathing.  Speech is clear and coherent with logical content.  Patient is alert and oriented at baseline.    Assessment and Plan: 1. Rheumatoid arthritis, involving unspecified site, unspecified whether rheumatoid factor present (HCC) - predniSONE (STERAPRED UNI-PAK 21 TAB) 10 MG (21) TBPK tablet; Use as directed  Dispense: 21 tablet; Refill: 0 Rest Encourage ROM exercises  Follow up with PCP this week  Follow Up Instructions: I discussed the assessment and treatment plan with the patient. The patient was provided an opportunity to ask questions and all were answered. The patient agreed with the plan and demonstrated an understanding of the instructions.  A copy of instructions were sent to the patient via MyChart.  The patient was advised to call back or seek an in-person evaluation if the symptoms worsen or if the condition fails to improve as anticipated.  Time:  I spent 8 minutes with the patient via telehealth technology discussing  the above problems/concerns.    Evelina Dun, FNP

## 2021-01-30 ENCOUNTER — Telehealth: Payer: Self-pay | Admitting: *Deleted

## 2021-01-30 NOTE — Telephone Encounter (Signed)
PLEASE NOTE: All timestamps contained within this report are represented as [place] Standard Time. CONFIDENTIALTY NOTICE: This fax transmission is intended only for the addressee. It contains information that is legally privileged, confidential or otherwise protected from use or disclosure. If you are not the intended recipient, you are strictly prohibited from reviewing, disclosing, copying using or disseminating any of this information or taking any action in reliance on or regarding this information. If you have received this fax in error, please notify us immediately by telephone so that we can arrange for its return to [place]. Phone: [phone], Toll-Free: [phone], Fax: [phone] Page: 1 of 1 Call Id: [id number] [place] RECORD [facility] Patient Name: Wendy Murray [facility] Gender: Female DOB: 1935-11-28 Age: 85 Y 31 M 18 D Return Phone Number: [phone] (Primary) Address: City/ State/ Zip: [place]  [address] Client [facility] Night - Client Client Site [place] Physician [name]- MD Contact Type Call Who Is Calling Patient / Member / Family / Caregiver Call Type Triage / Clinical Relationship To Patient Self Return Phone Number [phone] (Primary) Chief Complaint Prescription Refill or Medication Request (non symptomatic) Reason for Call Medication Question / Request Initial Comment Caller states she lost a few of her pills in her prescription (Prednisone ) causing her to only have one pill left and a few days remaining without her medication. She is unsure what will happen if she does not take her pills and if she can get another prescription. She is having issues with her rheumatoid arthritis. Translation No Nurse Assessment Nurse: Carlis Abbott, RN, Estill Bamberg Date/Time (Eastern Time): 01/29/2021 6:17:48 PM Confirm and document reason for call.  If symptomatic, describe symptoms. ---Caller had called about her Prednisone pills and she had lost some. Does the patient have any new or worsening symptoms? ---No Please document clinical information provided and list any resource used. ---Continues to deny any new or worsening sx or other issues. States that her problem has been solved and they were able to locate the missing pills in the arm of a chair. Everything is okay now. Advised to call back with further questions or concerns. Disp. Time Eilene Ghazi Time) Disposition Final User 01/29/2021 6:20:58 PM Clinical Call Yes Carlis Abbott, RN, Estill Bamberg

## 2021-01-30 NOTE — Telephone Encounter (Signed)
Called patient and was advised to disregard this phone call because she found the missing pills.

## 2021-01-31 ENCOUNTER — Telehealth: Payer: Self-pay | Admitting: Internal Medicine

## 2021-01-31 ENCOUNTER — Other Ambulatory Visit: Payer: Self-pay

## 2021-01-31 ENCOUNTER — Encounter: Payer: Self-pay | Admitting: Emergency Medicine

## 2021-01-31 DIAGNOSIS — Z7982 Long term (current) use of aspirin: Secondary | ICD-10-CM | POA: Insufficient documentation

## 2021-01-31 DIAGNOSIS — Z87891 Personal history of nicotine dependence: Secondary | ICD-10-CM | POA: Diagnosis not present

## 2021-01-31 DIAGNOSIS — R791 Abnormal coagulation profile: Secondary | ICD-10-CM | POA: Insufficient documentation

## 2021-01-31 DIAGNOSIS — E039 Hypothyroidism, unspecified: Secondary | ICD-10-CM | POA: Insufficient documentation

## 2021-01-31 DIAGNOSIS — I10 Essential (primary) hypertension: Secondary | ICD-10-CM | POA: Diagnosis not present

## 2021-01-31 DIAGNOSIS — Z96652 Presence of left artificial knee joint: Secondary | ICD-10-CM | POA: Insufficient documentation

## 2021-01-31 DIAGNOSIS — M069 Rheumatoid arthritis, unspecified: Secondary | ICD-10-CM | POA: Diagnosis not present

## 2021-01-31 DIAGNOSIS — Z79899 Other long term (current) drug therapy: Secondary | ICD-10-CM | POA: Insufficient documentation

## 2021-01-31 DIAGNOSIS — M546 Pain in thoracic spine: Secondary | ICD-10-CM | POA: Diagnosis not present

## 2021-01-31 LAB — COMPREHENSIVE METABOLIC PANEL
ALT: 31 U/L (ref 0–44)
AST: 19 U/L (ref 15–41)
Albumin: 4 g/dL (ref 3.5–5.0)
Alkaline Phosphatase: 114 U/L (ref 38–126)
Anion gap: 12 (ref 5–15)
BUN: 28 mg/dL — ABNORMAL HIGH (ref 8–23)
CO2: 27 mmol/L (ref 22–32)
Calcium: 9.8 mg/dL (ref 8.9–10.3)
Chloride: 99 mmol/L (ref 98–111)
Creatinine, Ser: 1 mg/dL (ref 0.44–1.00)
GFR, Estimated: 56 mL/min — ABNORMAL LOW (ref >60)
Glucose, Bld: 101 mg/dL — ABNORMAL HIGH (ref 70–99)
Potassium: 3.5 mmol/L (ref 3.5–5.1)
Sodium: 138 mmol/L (ref 135–145)
Total Bilirubin: 0.4 mg/dL (ref 0.3–1.2)
Total Protein: 7.3 g/dL (ref 6.5–8.1)

## 2021-01-31 LAB — CBC WITH DIFFERENTIAL/PLATELET
Abs Immature Granulocytes: 0.14 10*3/uL — ABNORMAL HIGH (ref 0.00–0.07)
Basophils Absolute: 0.1 10*3/uL (ref 0.0–0.1)
Basophils Relative: 0 %
Eosinophils Absolute: 0.1 10*3/uL (ref 0.0–0.5)
Eosinophils Relative: 1 %
HCT: 43 % (ref 36.0–46.0)
Hemoglobin: 13.7 g/dL (ref 12.0–15.0)
Immature Granulocytes: 1 %
Lymphocytes Relative: 19 %
Lymphs Abs: 2.6 10*3/uL (ref 0.7–4.0)
MCH: 27.5 pg (ref 26.0–34.0)
MCHC: 31.9 g/dL (ref 30.0–36.0)
MCV: 86.2 fL (ref 80.0–100.0)
Monocytes Absolute: 1.4 10*3/uL — ABNORMAL HIGH (ref 0.1–1.0)
Monocytes Relative: 10 %
Neutro Abs: 9.6 10*3/uL — ABNORMAL HIGH (ref 1.7–7.7)
Neutrophils Relative %: 69 %
Platelets: 577 10*3/uL — ABNORMAL HIGH (ref 150–400)
RBC: 4.99 MIL/uL (ref 3.87–5.11)
RDW: 13.3 % (ref 11.5–15.5)
WBC: 13.8 10*3/uL — ABNORMAL HIGH (ref 4.0–10.5)
nRBC: 0 % (ref 0.0–0.2)

## 2021-01-31 LAB — PROTIME-INR
INR: 0.9 (ref 0.8–1.2)
Prothrombin Time: 12.4 seconds (ref 11.4–15.2)

## 2021-01-31 LAB — TROPONIN I (HIGH SENSITIVITY): Troponin I (High Sensitivity): 7 ng/L (ref <18)

## 2021-01-31 NOTE — Telephone Encounter (Signed)
Wendy Murray called in and stated that she is having a flair up with her arthritis and she said it feels like its stuck and she is out of the Prednisone. I did reach out to access and she stated that she is going to call Dr. Jefm Bryant.

## 2021-01-31 NOTE — ED Triage Notes (Signed)
Pt from [facility] was seen here on 8/9 and dx with a TIA, she reports since then she has had a flare up of her RA. The pain is lodged on the left side of her neck. She has been taking her prednisone but it is not helping. She called her PMD and he told her to come to the ED to be evaluated.

## 2021-01-31 NOTE — ED Triage Notes (Signed)
Prednisone did help at first now it is not

## 2021-01-31 NOTE — Telephone Encounter (Signed)
Pt said that she cannot hold her head up. Pain in lower head and neck. Pain level is 8. When gets up to move around cannot manage due to pain and cannot hold up head. Area started on 01/30/21 that pt cannot hold up head. Pt having hard time following a conversation.pt said recently in hospital for TIA. Pt does not want to go to UC or ED and no available appts at [facility]; I spoke with [facility] CMA and pt notified of Dr Everardo Beals comments and pt voiced understanding. Pt will call DR Jefm Bryant and I did warm transfer for pt if not better will need to call EMS at [facility] Pt has not seen Dr Jefm Bryant since 2016. In warm transfer I spoke with Jinny Blossom at Dr Nancie Neas office and she said pt would need new referral since so long since pt seen. Pt said she got recent prednisone 10 mg taper by pts daughter who got med from [place] in [place]. Sending note to Dr Silvio Pate and Larene Beach CMA and also letting Larene Beach CMA know this info. Pt request cb after reviewed by Dr Silvio Pate.

## 2021-01-31 NOTE — Telephone Encounter (Signed)
[facility] Day - Client TELEPHONE ADVICE RECORD [facility] Patient Name: Wendy Murray [facility] Gender: Female DOB: [birth year] Age: 85 Y 93 M 20 D Return Phone Number: [phone] (Primary) Address: City/ State/ Zip: [place] [address] Client [facility] Day - Client Client Site [place] - Day Physician [name]- MD Contact Type Call Who Is Calling Patient / Member / Family / Caregiver Call Type Triage / Clinical Relationship To Patient Self Return Phone Number [phone] (Primary) Chief Complaint Headache Reason for Call Symptomatic / Request for [place] states she has been on Prednisone for her arthritis. She has a soft spot on the back of her head and neck. She can't hold her head up. Office does not have any available appointments today. Caller states she has pain in her head. Translation No Disp. Time Eilene Ghazi Time) Disposition Final User 01/31/2021 1:40:49 PM Attempt made - message left Ronnald Ramp, RN, Miranda 01/31/2021 1:45:16 PM Attempt made - no message left Ronnald Ramp RN, Miranda 01/31/2021 1:57:38 PM FINAL ATTEMPT MADE - no message left Yes Markus Daft,

## 2021-01-31 NOTE — Telephone Encounter (Signed)
Spoke to pt. Advised her what Dr Silvio Pate advised. She said she will do as he has suggested.

## 2021-02-01 ENCOUNTER — Emergency Department
Admission: EM | Admit: 2021-02-01 | Discharge: 2021-02-01 | Disposition: A | Discharge status: Home / Self Care | Payer: Medicare Other | Attending: Emergency Medicine | Admitting: Emergency Medicine

## 2021-02-01 DIAGNOSIS — M546 Pain in thoracic spine: Secondary | ICD-10-CM

## 2021-02-01 DIAGNOSIS — M069 Rheumatoid arthritis, unspecified: Secondary | ICD-10-CM

## 2021-02-01 DIAGNOSIS — I10 Essential (primary) hypertension: Secondary | ICD-10-CM | POA: Diagnosis not present

## 2021-02-01 MED ORDER — ONDANSETRON 4 MG PO TBDP: 4.0000 mg | ORAL_TABLET | Freq: Four times a day (QID) | ORAL | 0 refills | Status: DC | PRN

## 2021-02-01 MED ORDER — HYDROCODONE-ACETAMINOPHEN 5-325 MG PO TABS: 1.0000 | ORAL_TABLET | Freq: Four times a day (QID) | ORAL | 0 refills | Status: DC | PRN

## 2021-02-01 MED ORDER — ACETAMINOPHEN 500 MG PO TABS
1000.0000 mg | ORAL_TABLET | Freq: Once | ORAL | Status: AC
  Administered 2021-02-01: 1000 mg via ORAL
  Filled 2021-02-01: qty 2

## 2021-02-01 NOTE — Discharge Instructions (Addendum)
Please continue your prednisone as prescribed.   You are being provided a prescription for opiates (also known as narcotics) for pain control.  Opiates can be addictive and should only be used when absolutely necessary for pain control when other alternatives do not work.  We recommend you only use them for the recommended amount of time and only as prescribed.  Please do not take with other sedative medications or alcohol.  Please do not drive, operate machinery, make important decisions while taking opiates.  Please note that these medications can be addictive and have high abuse potential.  Patients can become addicted to narcotics after only taking them for a few days.  Please keep these medications locked away from children, teenagers or any family members with history of substance abuse.  Narcotic pain medicine may also make you constipated.  You may use over-the-counter medications such as MiraLAX, Colace to prevent constipation.  If you become constipated you may use over-the-counter enemas as needed.  Itching and nausea are common side effects of narcotic pain medication.  If you develop uncontrolled vomiting or a rash, please stop these medications.

## 2021-02-01 NOTE — Telephone Encounter (Signed)
Pt did go to the ER early this morning and was D/C within a few hours.

## 2021-02-01 NOTE — ED Provider Notes (Signed)
[facility] Emergency Department Provider Note  ____________________________________________   Event Date/Time   First MD Initiated Contact with Patient 02/01/21 [phone]     (approximate)  I have reviewed the triage vital signs and the nursing notes.   HISTORY  Chief Complaint Back Pain and Neck Pain    HPI Wendy Murray is a 85 y.o. female with history of rheumatoid arthritis, recent TIA on aspirin who presents to the emergency department with complaints of upper thoracic midline and left-sided back pain for the past several days.  Has had history of rheumatoid arthritis flares 3 times before and states this is how she normally presents.  States she has been on prednisone for several days and pain was improving until yesterday when it suddenly plateaued.  She denies that it got worse in nature but is just not improving anymore.  She denies any headache, numbness, tingling, weakness, chest pain, shortness of breath, fever, cough, vomiting or diarrhea.  States she contacted her primary care doctor who recommended that she come to the emergency department out of an abundance of caution given her recent stroke.  On review of her records, patient did have carotid Dopplers on 01/23/2021 which showed minimal heterogeneous plaque with no significant stenosis.  MRA of the brain was normal on 01/24/2021.  She denies any head or back injury.  No previous surgeries to this area.  No injections.  No bowel or bladder incontinence.  No urinary retention.  Able to ambulate.        Past Medical History:  Diagnosis Date   Diverticulosis    DVT (deep venous thrombosis) ([place]) 2012   after TKR   Hyperlipidemia    Hyperparathyroidism ([place])    Hypothyroidism    IBS (irritable bowel syndrome)    Inflammatory arthritis    Osteoarthritis, knee    Recurrent major depression ([place])     Patient Active Problem List   Diagnosis Date Noted   TIA (transient ischemic attack)  01/23/2021   GERD (gastroesophageal reflux disease) 12/16/2016   Hyperparathyroidism ([place])    Advance directive discussed with patient 12/13/2014   Routine general medical examination at a health care facility 12/12/2013   Hyperlipidemia    Recurrent major depression ([place])    Hypothyroidism    Osteoarthritis, knee    IBS (irritable bowel syndrome)     Past Surgical History:  Procedure Laterality Date   ABDOMINAL HYSTERECTOMY  1991   CATARACT EXTRACTION W/ INTRAOCULAR LENS  IMPLANT, BILATERAL  2014   CHOLECYSTECTOMY  1998   DEXA  3/09   Normal   HAMMER TOE SURGERY  5/16   Dr Barkley Bruns   LUMBAR FUSION  1993   MENISCUS REPAIR Left 11/11   TOTAL KNEE ARTHROPLASTY Left 2012    Prior to Admission medications   Medication Sig Start Date End Date Taking? Authorizing Provider  HYDROcodone-acetaminophen (NORCO/VICODIN) 5-325 MG tablet Take 1 tablet by mouth every 6 (six) hours as needed. 02/01/21  Yes Shyhiem Beeney, Cyril Mourning N, DO  ondansetron (ZOFRAN ODT) 4 MG disintegrating tablet Take 1 tablet (4 mg total) by mouth every 6 (six) hours as needed for nausea or vomiting. 02/01/21  Yes Cade Olberding, Delice Bison, DO  aspirin EC 81 MG EC tablet Take 1 tablet (81 mg total) by mouth daily. Swallow whole. 01/25/21   Lorella Nimrod, MD  aspirin-acetaminophen-caffeine (EXCEDRIN MIGRAINE) [phone] MG tablet Take 1 tablet by mouth every 6 (six) hours as needed for headache.    [provider]  atorvastatin (  LIPITOR) 40 MG tablet Take 1 tablet (40 mg total) by mouth daily. 01/25/21   Lorella Nimrod, MD  clonazePAM (KLONOPIN) 1 MG tablet Take 1 tablet (1 mg total) by mouth at bedtime. 01/08/21   Kennyth Arnold, FNP  levothyroxine (SYNTHROID) 75 MCG tablet TAKE ONE TABLET BY MOUTH DAILY BEFORE BREAKFAST 07/10/20   [name] I, MD  mirtazapine (REMERON) 15 MG tablet Take 1 tablet (15 mg total) by mouth at bedtime. 07/10/20   Venia Carbon, MD  Multiple Vitamin (MULTIVITAMIN WITH MINERALS) TABS tablet Take 1 tablet  by mouth daily.    [provider]  omeprazole (PRILOSEC) 20 MG capsule Take 20 mg by mouth daily.    [provider]  predniSONE (STERAPRED UNI-PAK 21 TAB) 10 MG (21) TBPK tablet Use as directed 01/27/21   Evelina Dun A, FNP  Probiotic Product (ALIGN) 4 MG CAPS Take 1 capsule by mouth daily. Patient not taking: No sig reported    [provider]  venlafaxine XR (EFFEXOR-XR) 150 MG 24 hr capsule Take 1 capsule (150 mg total) by mouth daily. Pt takes with a '75mg'$  capsule. 07/10/20   Venia Carbon, MD  venlafaxine XR (EFFEXOR-XR) 75 MG 24 hr capsule Take 1 capsule (75 mg total) by mouth daily. Pt takes with a '150mg'$  capsule. 07/10/20   Venia Carbon, MD    Allergies Patient has no known allergies.  Family History  Problem Relation Age of Onset   Cancer Mother        colon   Depression Mother    Cancer Father    Depression Sister    Hypertension Neg Hx    Heart disease Neg Hx    Diabetes Neg Hx     Social History Social History   Tobacco Use   Smoking status: Former    Types: Cigarettes    Quit date: 06/16/1981    Years since quitting: 39.6   Smokeless tobacco: Never   Tobacco comments:    quit 1983  Substance Use Topics   Alcohol use: Yes    Alcohol/week: 0.0 standard drinks    Comment: rarely   Drug use: No    Review of Systems Constitutional: No fever. Eyes: No visual changes. ENT: No sore throat. Cardiovascular: Denies chest pain. Respiratory: Denies shortness of breath. Gastrointestinal: No nausea, vomiting, diarrhea. Genitourinary: Negative for dysuria. Musculoskeletal: Negative for back pain. Skin: Negative for rash. Neurological: Negative for focal weakness or numbness.  ____________________________________________   PHYSICAL EXAM:  VITAL SIGNS: ED Triage Vitals  Enc Vitals Group     BP 01/31/21 1905 (!) 166/87     Pulse Rate 01/31/21 1905 (!) 105     Resp 01/31/21 1905 18     Temp 01/31/21 1905 98.3 F (36.8 C)      Temp src --      SpO2 01/31/21 1905 95 %     Weight 01/31/21 1906 175 lb (79.4 kg)     Height 01/31/21 1906 '5\' 5"'$  (1.651 m)     Head Circumference --      Peak Flow --      Pain Score 01/31/21 1905 0     Pain Loc --      Pain Edu? --      Excl. in [place]? --    CONSTITUTIONAL: Alert and oriented and responds appropriately to questions. Well-appearing; well-nourished, afebrile HEAD: Normocephalic, atraumatic EYES: Conjunctivae clear, pupils appear equal, EOM appear intact ENT: normal nose; moist mucous membranes NECK: Supple,  normal ROM, no midline spinal tenderness or step-off or deformity CARD: RRR; S1 and S2 appreciated; no murmurs, no clicks, no rubs, no gallops RESP: Normal chest excursion without splinting or tachypnea; breath sounds clear and equal bilaterally; no wheezes, no rhonchi, no rales, no hypoxia or respiratory distress, speaking full sentences ABD/GI: Normal bowel sounds; non-distended; soft, non-tender, no rebound, no guarding, no peritoneal signs, no hepatosplenomegaly BACK: The back appears normal, no midline spinal tenderness or step-off or deformity, patient is mostly tender over the left thoracic paraspinal muscles without redness, warmth, soft tissue swelling, ecchymosis, rash or other lesions EXT: Normal ROM in all joints; no deformity noted, no edema; no cyanosis SKIN: Normal color for age and race; warm; no rash on exposed skin NEURO: Moves all extremities equally, normal sensation diffusely, normal gait, cranial nerves II through XII intact, normal speech PSYCH: [name] mood and manner are appropriate.  ____________________________________________   LABS (all labs ordered are listed, but only abnormal results are displayed)  Labs Reviewed  COMPREHENSIVE METABOLIC PANEL - Abnormal; Notable for the following components:      Result Value   Glucose, Bld 101 (*)    BUN 28 (*)    GFR, Estimated 56 (*)    All other components within normal limits  CBC WITH  DIFFERENTIAL/PLATELET - Abnormal; Notable for the following components:   WBC 13.8 (*)    Platelets 577 (*)    Neutro Abs 9.6 (*)    Monocytes Absolute 1.4 (*)    Abs Immature Granulocytes 0.14 (*)    All other components within normal limits  PROTIME-INR  TROPONIN I (HIGH SENSITIVITY)  TROPONIN I (HIGH SENSITIVITY)   ____________________________________________  EKG   EKG Interpretation  Date/Time:  Thursday January 31 2021 22:20:27 EDT Ventricular Rate:  83 PR Interval:  114 QRS Duration: 88 QT Interval:  364 QTC Calculation: 427 R Axis:   15 Text Interpretation: Normal sinus rhythm Normal ECG Confirmed by Pryor Curia [phone]) on 02/01/2021 1:08:43 AM        ____________________________________________  RADIOLOGY Jessie Foot Charlisa Cham, personally viewed and evaluated these images (plain radiographs) as part of my medical decision making, as well as reviewing the written report by the radiologist.  ED MD interpretation:    Official radiology report(s): No results found.  ____________________________________________   PROCEDURES  Procedure(s) performed (including Critical Care):  Procedures    ____________________________________________   INITIAL IMPRESSION / ASSESSMENT AND PLAN / ED COURSE  As part of my medical decision making, I reviewed the following data within the [facility] notes reviewed and incorporated, Labs reviewed , EKG interpreted , Old EKG reviewed, Old chart reviewed, Notes from prior ED visits, and [place] Controlled Substance Database         Patient here with left-sided thoracic back pain that she describes as similar to her previous rheumatoid arthritis flares.  Symptoms were improving with prednisone but then have plateaued.  Sent here out of a "abundance of caution" by her primary care doctor.  Here she is very well-appearing, neurologically intact without chest pain, shortness of breath or infectious symptoms.  I do  not think this is her anginal equivalent.  Her EKG shows no ischemic abnormality and she has had normal troponins.  She has no midline tenderness on exam and no injury.  I do not think that she has a fracture to this area.  Her pain seems mostly over the paraspinal muscles.  Low suspicion for epidural abscess, discitis or osteomyelitis,  epidural hematoma, transverse myelitis, spinal stenosis, cervical myelopathy.  I do not feel she needs emergent imaging of her neck or back at this time.  She is not having any bilateral neck pain.  She had carotid Dopplers which were reassuring.  I have low suspicion for carotid or vertebral dissection today.  She states this is exactly how she has presented before with arthritis flares and that prednisone has been helping her pain but just plateaued today.  Have advised her to continue her prednisone and will discharge with prescription of pain medication.  She has close follow-up with her outpatient doctor.  Patient is comfortable with this plan.  Discussed return precautions at length.   At this time, I do not feel there is any life-threatening condition present. I have reviewed, interpreted and discussed all results (EKG, imaging, lab, urine as appropriate) and exam findings with patient/family. I have reviewed nursing notes and appropriate previous records.  I feel the patient is safe to be discharged home without further emergent workup and can continue workup as an outpatient as needed. Discussed usual and customary return precautions. Patient/family verbalize understanding and are comfortable with this plan.  Outpatient follow-up has been provided as needed. All questions have been answered.  ____________________________________________   FINAL CLINICAL IMPRESSION(S) / ED DIAGNOSES  Final diagnoses:  Acute left-sided thoracic back pain  Rheumatoid arthritis flare [facility])     ED Discharge Orders          Ordered    HYDROcodone-acetaminophen (NORCO/VICODIN)  5-325 MG tablet  Every 6 hours PRN        02/01/21 0132    ondansetron (ZOFRAN ODT) 4 MG disintegrating tablet  Every 6 hours PRN        02/01/21 0132            *Please note:  [name] was evaluated in Emergency Department on 02/01/2021 for the symptoms described in the history of present illness. She was evaluated in the context of the global COVID-19 pandemic, which necessitated consideration that the patient might be at risk for infection with the SARS-CoV-2 virus that causes COVID-19. Institutional protocols and algorithms that pertain to the evaluation of patients at risk for COVID-19 are in a state of rapid change based on information released by regulatory bodies including the CDC and federal and state organizations. These policies and algorithms were followed during the patient's care in the ED.  Some ED evaluations and interventions may be delayed as a result of limited staffing during and the pandemic.*   Note:  This document was prepared using Dragon voice recognition software and may include unintentional dictation errors.    Quayshawn Nin, Delice Bison, DO 02/01/21 [phone]

## 2021-02-01 NOTE — ED Notes (Signed)
Patient reports she is in independent apartment at [facility], and drives her own car. Patient reports she drove herself here and is driving herself home via POV. No report called to facility, as patient is independent.

## 2021-02-04 ENCOUNTER — Telehealth: Payer: Self-pay

## 2021-02-04 NOTE — Telephone Encounter (Signed)
Called pt to see how she was doing after ER visit for Neck Pain. She is sleeping right now. Made her an appt 02-08-21.

## 2021-02-08 ENCOUNTER — Other Ambulatory Visit: Payer: Self-pay

## 2021-02-08 ENCOUNTER — Ambulatory Visit (INDEPENDENT_AMBULATORY_CARE_PROVIDER_SITE_OTHER): Payer: Medicare Other | Admitting: Internal Medicine

## 2021-02-08 ENCOUNTER — Encounter: Payer: Self-pay | Admitting: Internal Medicine

## 2021-02-08 VITALS — BP 124/80 | HR 88 | Temp 97.7°F | Ht 64.0 in | Wt 179.0 lb

## 2021-02-08 DIAGNOSIS — M353 Polymyalgia rheumatica: Secondary | ICD-10-CM | POA: Diagnosis not present

## 2021-02-08 DIAGNOSIS — Z23 Encounter for immunization: Secondary | ICD-10-CM | POA: Diagnosis not present

## 2021-02-08 DIAGNOSIS — G459 Transient cerebral ischemic attack, unspecified: Secondary | ICD-10-CM

## 2021-02-08 LAB — LIPID PANEL
Cholesterol: 165 mg/dL (ref 0–200)
HDL: 39.8 mg/dL (ref >39.00)
LDL Cholesterol: 88 mg/dL (ref 0–99)
NonHDL: 124.95
Total CHOL/HDL Ratio: 4
Triglycerides: 183 mg/dL — ABNORMAL HIGH (ref 0.0–149.0)
VLDL: 36.6 mg/dL (ref 0.0–40.0)

## 2021-02-08 LAB — CBC
HCT: 40.4 % (ref 36.0–46.0)
Hemoglobin: 12.8 g/dL (ref 12.0–15.0)
MCHC: 31.7 g/dL (ref 30.0–36.0)
MCV: 84.2 fl (ref 78.0–100.0)
Platelets: 447 10*3/uL — ABNORMAL HIGH (ref 150.0–400.0)
RBC: 4.8 Mil/uL (ref 3.87–5.11)
RDW: 13.8 % (ref 11.5–15.5)
WBC: 11.9 10*3/uL — ABNORMAL HIGH (ref 4.0–10.5)

## 2021-02-08 LAB — COMPREHENSIVE METABOLIC PANEL
ALT: 21 U/L (ref 0–35)
AST: 17 U/L (ref 0–37)
Albumin: 3.9 g/dL (ref 3.5–5.2)
Alkaline Phosphatase: 114 U/L (ref 39–117)
BUN: 18 mg/dL (ref 6–23)
CO2: 28 mEq/L (ref 19–32)
Calcium: 10.6 mg/dL — ABNORMAL HIGH (ref 8.4–10.5)
Chloride: 101 mEq/L (ref 96–112)
Creatinine, Ser: 0.9 mg/dL (ref 0.40–1.20)
GFR: 59.09 mL/min — ABNORMAL LOW (ref >60.00)
Glucose, Bld: 97 mg/dL (ref 70–99)
Potassium: 4.7 mEq/L (ref 3.5–5.1)
Sodium: 136 mEq/L (ref 135–145)
Total Bilirubin: 0.4 mg/dL (ref 0.2–1.2)
Total Protein: 7.4 g/dL (ref 6.0–8.3)

## 2021-02-08 LAB — SEDIMENTATION RATE: Sed Rate: 35 mm/hr — ABNORMAL HIGH (ref 0–30)

## 2021-02-08 NOTE — Progress Notes (Signed)
Subjective:    Patient ID: Wendy Murray, female    DOB: March 11, 1936, 85 y.o.   MRN: [id number]  HPI Here for follow up after recent hospitalization and ER visit With husband This visit occurred during the SARS-CoV-2 public health emergency.  Safety protocols were in place, including screening questions prior to the visit, additional usage of staff PPE, and extensive cleaning of exam room while observing appropriate contact time as indicated for disinfecting solutions.   Had spells of expressive aphasia---seconds or few minutes She was aware of it No weakness, facial droop or other neurologic symptoms Reviewed MRI, carotids, echo---all okay Sent home on asa and statin No recurrence of these symptoms  Started with sore spot in posterior cervical area--when in the hospital Especially bad when reaching "SO painful" Then seemed to envelope her entire upper body---neck, shoulders, back sore Dr Precious Reel diagnosed RA years ago--- I discussed that I think it was PMR  Did get prednisone through her daughter's doctor 12 days ago Did taper '60mg'$ --and down Didn't help after 5 days so stopped it  Current Outpatient Medications on File Prior to Visit  Medication Sig Dispense Refill   aspirin EC 81 MG EC tablet Take 1 tablet (81 mg total) by mouth daily. Swallow whole. 30 tablet 11   atorvastatin (LIPITOR) 40 MG tablet Take 1 tablet (40 mg total) by mouth daily. 90 tablet 1   clonazePAM (KLONOPIN) 1 MG tablet Take 1 tablet (1 mg total) by mouth at bedtime. 90 tablet 0   HYDROcodone-acetaminophen (NORCO/VICODIN) 5-325 MG tablet Take 1 tablet by mouth every 6 (six) hours as needed. 15 tablet 0   levothyroxine (SYNTHROID) 75 MCG tablet TAKE ONE TABLET BY MOUTH DAILY BEFORE BREAKFAST 90 tablet 3   mirtazapine (REMERON) 15 MG tablet Take 1 tablet (15 mg total) by mouth at bedtime. 90 tablet 3   Multiple Vitamin (MULTIVITAMIN WITH MINERALS) TABS tablet Take 1 tablet by mouth daily.      omeprazole (PRILOSEC) 20 MG capsule Take 20 mg by mouth daily.     ondansetron (ZOFRAN ODT) 4 MG disintegrating tablet Take 1 tablet (4 mg total) by mouth every 6 (six) hours as needed for nausea or vomiting. 20 tablet 0   Probiotic Product (ALIGN) 4 MG CAPS Take 1 capsule by mouth daily.     venlafaxine XR (EFFEXOR-XR) 150 MG 24 hr capsule Take 1 capsule (150 mg total) by mouth daily. Pt takes with a '75mg'$  capsule. 90 capsule 3   venlafaxine XR (EFFEXOR-XR) 75 MG 24 hr capsule Take 1 capsule (75 mg total) by mouth daily. Pt takes with a '150mg'$  capsule. 90 capsule 3   No current facility-administered medications on file prior to visit.    No Known Allergies  Past Medical History:  Diagnosis Date   Diverticulosis    DVT (deep venous thrombosis) ([place]) 2012   after TKR   Hyperlipidemia    Hyperparathyroidism ([place])    Hypothyroidism    IBS (irritable bowel syndrome)    Inflammatory arthritis    Osteoarthritis, knee    Recurrent major depression ([place])     Past Surgical History:  Procedure Laterality Date   ABDOMINAL HYSTERECTOMY  1991   CATARACT EXTRACTION W/ INTRAOCULAR LENS  IMPLANT, BILATERAL  2014   CHOLECYSTECTOMY  1998   DEXA  3/09   Normal   HAMMER TOE SURGERY  5/16   Dr Barkley Bruns   LUMBAR FUSION  1993   MENISCUS REPAIR Left 11/11   TOTAL KNEE ARTHROPLASTY  Left 2012    Family History  Problem Relation Age of Onset   Cancer Mother        colon   Depression Mother    Cancer Father    Depression Sister    Hypertension Neg Hx    Heart disease Neg Hx    Diabetes Neg Hx     Social History   Socioeconomic History   Marital status: Married    Spouse name: Not on file   Number of children: 3   Years of education: Not on file   Highest education level: Not on file  Occupational History   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    Comment: Retired  Tobacco Use   Smoking status: Former    Types: Cigarettes    Quit date: 06/16/1981    Years since quitting:  39.6   Smokeless tobacco: Never   Tobacco comments:    quit 1983  Substance and Sexual Activity   Alcohol use: Yes    Alcohol/week: 0.0 standard drinks    Comment: rarely   Drug use: No   Sexual activity: Not Currently  Other Topics Concern   Not on file  Social History Narrative   [place]      Has living will    Husband, then daughter Joycelyn Schmid, have health care POA   Has DNR ---order rewritten   No feeding tube if cognitively unaware   Will be donating body to [place] Determinants of Health   Financial Resource Strain: Not on file  Food Insecurity: Not on file  Transportation Needs: Not on file  Physical Activity: Not on file  Stress: Not on file  Social Connections: Not on file  Intimate Partner Violence: Not on file   Review of Systems No fever Some fatigue--not able to do the housework, etc No hand swelling or in feet or other joints     Objective:   Physical Exam Constitutional:      Appearance: Normal appearance.  Neck:     Comments: Painful area is in the left cervical paraspinal muscles--not the spine Musculoskeletal:     Cervical back: Neck supple.     Comments: No active synovitis in any joints  Neurological:     Mental Status: She is alert.  Psychiatric:        Mood and Affect: Mood normal.        Behavior: Behavior normal.           Assessment & Plan:

## 2021-02-08 NOTE — Assessment & Plan Note (Signed)
Really seems to have myalgia --not arthritis----but she feels she had the diagnosis of RA PMR type syndrome--but didn't improve with prednisone burst from ER Ongoing pain---worst focally. Will try lidocaine patch Check labs---if sed rate up, will start prednisone '20mg'$  Refer back to Dr Jefm Bryant

## 2021-02-08 NOTE — Patient Instructions (Signed)
Please try an over the counter lidocaine patch on that very painful spot on the back of your neck.

## 2021-02-08 NOTE — Assessment & Plan Note (Signed)
Brief aphasia spells Neuro work up negative No recurrence Will check labs on statin, asa

## 2021-02-11 ENCOUNTER — Other Ambulatory Visit: Payer: Self-pay

## 2021-02-11 MED ORDER — PREDNISONE 10 MG PO TABS: 20.0000 mg | ORAL_TABLET | Freq: Every day | ORAL | 5 refills | Status: DC

## 2021-02-14 DIAGNOSIS — Z20822 Contact with and (suspected) exposure to covid-19: Secondary | ICD-10-CM | POA: Diagnosis not present

## 2021-02-26 DIAGNOSIS — M546 Pain in thoracic spine: Secondary | ICD-10-CM | POA: Diagnosis not present

## 2021-02-26 DIAGNOSIS — M159 Polyosteoarthritis, unspecified: Secondary | ICD-10-CM | POA: Diagnosis not present

## 2021-02-26 DIAGNOSIS — M542 Cervicalgia: Secondary | ICD-10-CM | POA: Diagnosis not present

## 2021-03-07 DIAGNOSIS — Z23 Encounter for immunization: Secondary | ICD-10-CM | POA: Diagnosis not present

## 2021-03-12 ENCOUNTER — Encounter: Payer: Self-pay | Admitting: Internal Medicine

## 2021-03-12 ENCOUNTER — Other Ambulatory Visit: Payer: Self-pay

## 2021-03-12 ENCOUNTER — Ambulatory Visit (INDEPENDENT_AMBULATORY_CARE_PROVIDER_SITE_OTHER): Payer: Medicare Other | Admitting: Internal Medicine

## 2021-03-12 DIAGNOSIS — M542 Cervicalgia: Secondary | ICD-10-CM | POA: Insufficient documentation

## 2021-03-12 DIAGNOSIS — M1711 Unilateral primary osteoarthritis, right knee: Secondary | ICD-10-CM | POA: Insufficient documentation

## 2021-03-12 NOTE — Assessment & Plan Note (Signed)
Dr Jefm Bryant feels this may be OA only PMR disproved Is better now with tizanidine

## 2021-03-12 NOTE — Progress Notes (Signed)
Subjective:    Patient ID: Wendy Murray, female    DOB: February 08, 1936, 85 y.o.   MRN: [id number]  HPI Here for follow up of neck and other pain This visit occurred during the SARS-CoV-2 public health emergency.  Safety protocols were in place, including screening questions prior to the visit, additional usage of staff PPE, and extensive cleaning of exam room while observing appropriate contact time as indicated for disinfecting solutions.   Neck pain persists--mostly posterior left neck Got tizanidine and that seems to have helped considerably (just 1/2 at a time) No hip or shoulder pain now Hasn't started the PT that was ordered  Going back to Dr Marry Guan for worsening right knee pain Considering TKR  No chest pain Has noted some increased DOE--relates to 40# extra Waking a little--but then limited by pain Current Outpatient Medications on File Prior to Visit  Medication Sig Dispense Refill   aspirin EC 81 MG EC tablet Take 1 tablet (81 mg total) by mouth daily. Swallow whole. 30 tablet 11   atorvastatin (LIPITOR) 40 MG tablet Take 1 tablet (40 mg total) by mouth daily. 90 tablet 1   clonazePAM (KLONOPIN) 1 MG tablet Take 1 tablet (1 mg total) by mouth at bedtime. 90 tablet 0   levothyroxine (SYNTHROID) 75 MCG tablet TAKE ONE TABLET BY MOUTH DAILY BEFORE BREAKFAST 90 tablet 3   mirtazapine (REMERON) 15 MG tablet Take 1 tablet (15 mg total) by mouth at bedtime. 90 tablet 3   Multiple Vitamin (MULTIVITAMIN WITH MINERALS) TABS tablet Take 1 tablet by mouth daily.     omeprazole (PRILOSEC) 20 MG capsule Take 20 mg by mouth daily.     ondansetron (ZOFRAN ODT) 4 MG disintegrating tablet Take 1 tablet (4 mg total) by mouth every 6 (six) hours as needed for nausea or vomiting. 20 tablet 0   Probiotic Product (ALIGN) 4 MG CAPS Take 1 capsule by mouth daily.     tiZANidine (ZANAFLEX) 4 MG tablet Take 0.5 tablets by mouth daily.     venlafaxine XR (EFFEXOR-XR) 150 MG 24 hr capsule Take 1  capsule (150 mg total) by mouth daily. Pt takes with a 75mg  capsule. 90 capsule 3   venlafaxine XR (EFFEXOR-XR) 75 MG 24 hr capsule Take 1 capsule (75 mg total) by mouth daily. Pt takes with a 150mg  capsule. 90 capsule 3   No current facility-administered medications on file prior to visit.    No Known Allergies  Past Medical History:  Diagnosis Date   Diverticulosis    DVT (deep venous thrombosis) ([place]) 2012   after TKR   Hyperlipidemia    Hyperparathyroidism ([place])    Hypothyroidism    IBS (irritable bowel syndrome)    Inflammatory arthritis    Osteoarthritis, knee    Recurrent major depression ([place])     Past Surgical History:  Procedure Laterality Date   ABDOMINAL HYSTERECTOMY  1991   CATARACT EXTRACTION W/ INTRAOCULAR LENS  IMPLANT, BILATERAL  2014   CHOLECYSTECTOMY  1998   DEXA  3/09   Normal   HAMMER TOE SURGERY  5/16   Dr Barkley Bruns   LUMBAR FUSION  1993   MENISCUS REPAIR Left 11/11   TOTAL KNEE ARTHROPLASTY Left 2012    Family History  Problem Relation Age of Onset   Cancer Mother        colon   Depression Mother    Cancer Father    Depression Sister    Hypertension Neg Hx    Heart  disease Neg Hx    Diabetes Neg Hx     Social History   Socioeconomic History   Marital status: Married    Spouse name: Not on file   Number of children: 3   Years of education: Not on file   Highest education level: Not on file  Occupational History   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    Comment: Retired  Tobacco Use   Smoking status: Former    Types: Cigarettes    Quit date: 06/16/1981    Years since quitting: 39.7   Smokeless tobacco: Never   Tobacco comments:    quit 1983  Substance and Sexual Activity   Alcohol use: Yes    Alcohol/week: 0.0 standard drinks    Comment: rarely   Drug use: No   Sexual activity: Not Currently  Other Topics Concern   Not on file  Social History Narrative   [place]      Has  living will    Husband, then daughter Joycelyn Schmid, have health care POA   Has DNR ---order rewritten   No feeding tube if cognitively unaware   Will be donating body to [place] Determinants of Health   Financial Resource Strain: Not on file  Food Insecurity: Not on file  Transportation Needs: Not on file  Physical Activity: Not on file  Stress: Not on file  Social Connections: Not on file  Intimate Partner Violence: Not on file   Review of Systems As dry mouth every morning--has to chew gum    Objective:   Physical Exam Constitutional:      Appearance: Normal appearance.  Neck:     Comments: Some tenderness along lower left trapezius Fairly normal ROM Cardiovascular:     Rate and Rhythm: Normal rate and regular rhythm.     Heart sounds: No murmur heard.   No gallop.  Pulmonary:     Effort: Pulmonary effort is normal.     Breath sounds: Normal breath sounds. No wheezing or rales.  Musculoskeletal:     Right lower leg: No edema.     Left lower leg: No edema.  Lymphadenopathy:     Cervical: No cervical adenopathy.  Neurological:     Mental Status: She is alert.  Psychiatric:        Mood and Affect: Mood normal.        Behavior: Behavior normal.           Assessment & Plan:

## 2021-03-12 NOTE — Assessment & Plan Note (Signed)
Getting ready to have TKR Discussed leg strengthening now Appears to be medically stable to proceed if they decide to go ahead

## 2021-03-14 DIAGNOSIS — M159 Polyosteoarthritis, unspecified: Secondary | ICD-10-CM | POA: Diagnosis not present

## 2021-03-14 DIAGNOSIS — M542 Cervicalgia: Secondary | ICD-10-CM | POA: Diagnosis not present

## 2021-03-18 DIAGNOSIS — M1711 Unilateral primary osteoarthritis, right knee: Secondary | ICD-10-CM | POA: Diagnosis not present

## 2021-03-23 ENCOUNTER — Inpatient Hospital Stay
Admission: EM | Admit: 2021-03-23 | Discharge: 2021-03-25 | DRG: 871 | Disposition: A | Discharge status: Home / Self Care | Payer: Medicare Other | Attending: Internal Medicine | Admitting: Internal Medicine

## 2021-03-23 ENCOUNTER — Encounter: Payer: Self-pay | Admitting: Emergency Medicine

## 2021-03-23 ENCOUNTER — Other Ambulatory Visit: Payer: Self-pay

## 2021-03-23 ENCOUNTER — Emergency Department: Payer: Medicare Other

## 2021-03-23 DIAGNOSIS — I5032 Chronic diastolic (congestive) heart failure: Secondary | ICD-10-CM | POA: Diagnosis present

## 2021-03-23 DIAGNOSIS — Z8673 Personal history of transient ischemic attack (TIA), and cerebral infarction without residual deficits: Secondary | ICD-10-CM | POA: Diagnosis present

## 2021-03-23 DIAGNOSIS — Z20822 Contact with and (suspected) exposure to covid-19: Secondary | ICD-10-CM | POA: Diagnosis present

## 2021-03-23 DIAGNOSIS — K219 Gastro-esophageal reflux disease without esophagitis: Secondary | ICD-10-CM | POA: Diagnosis present

## 2021-03-23 DIAGNOSIS — E785 Hyperlipidemia, unspecified: Secondary | ICD-10-CM | POA: Diagnosis present

## 2021-03-23 DIAGNOSIS — Z7982 Long term (current) use of aspirin: Secondary | ICD-10-CM | POA: Diagnosis not present

## 2021-03-23 DIAGNOSIS — J40 Bronchitis, not specified as acute or chronic: Secondary | ICD-10-CM | POA: Diagnosis not present

## 2021-03-23 DIAGNOSIS — Z96652 Presence of left artificial knee joint: Secondary | ICD-10-CM | POA: Diagnosis present

## 2021-03-23 DIAGNOSIS — Z981 Arthrodesis status: Secondary | ICD-10-CM

## 2021-03-23 DIAGNOSIS — Z79890 Hormone replacement therapy (postmenopausal): Secondary | ICD-10-CM

## 2021-03-23 DIAGNOSIS — J209 Acute bronchitis, unspecified: Secondary | ICD-10-CM | POA: Diagnosis present

## 2021-03-23 DIAGNOSIS — Z86718 Personal history of other venous thrombosis and embolism: Secondary | ICD-10-CM | POA: Diagnosis not present

## 2021-03-23 DIAGNOSIS — Z87891 Personal history of nicotine dependence: Secondary | ICD-10-CM | POA: Diagnosis not present

## 2021-03-23 DIAGNOSIS — R000 Tachycardia, unspecified: Secondary | ICD-10-CM | POA: Diagnosis not present

## 2021-03-23 DIAGNOSIS — A419 Sepsis, unspecified organism: Principal | ICD-10-CM | POA: Diagnosis present

## 2021-03-23 DIAGNOSIS — Z79899 Other long term (current) drug therapy: Secondary | ICD-10-CM

## 2021-03-23 DIAGNOSIS — J9601 Acute respiratory failure with hypoxia: Secondary | ICD-10-CM | POA: Diagnosis not present

## 2021-03-23 DIAGNOSIS — G459 Transient cerebral ischemic attack, unspecified: Secondary | ICD-10-CM

## 2021-03-23 DIAGNOSIS — E213 Hyperparathyroidism, unspecified: Secondary | ICD-10-CM | POA: Diagnosis present

## 2021-03-23 DIAGNOSIS — K449 Diaphragmatic hernia without obstruction or gangrene: Secondary | ICD-10-CM | POA: Diagnosis not present

## 2021-03-23 DIAGNOSIS — J029 Acute pharyngitis, unspecified: Secondary | ICD-10-CM | POA: Diagnosis not present

## 2021-03-23 DIAGNOSIS — R0602 Shortness of breath: Secondary | ICD-10-CM | POA: Diagnosis not present

## 2021-03-23 DIAGNOSIS — E039 Hypothyroidism, unspecified: Secondary | ICD-10-CM | POA: Diagnosis present

## 2021-03-23 DIAGNOSIS — R062 Wheezing: Secondary | ICD-10-CM | POA: Diagnosis not present

## 2021-03-23 DIAGNOSIS — R059 Cough, unspecified: Secondary | ICD-10-CM | POA: Diagnosis not present

## 2021-03-23 DIAGNOSIS — J45909 Unspecified asthma, uncomplicated: Secondary | ICD-10-CM

## 2021-03-23 LAB — CBC
HCT: 41.4 % (ref 36.0–46.0)
Hemoglobin: 13.6 g/dL (ref 12.0–15.0)
MCH: 28.2 pg (ref 26.0–34.0)
MCHC: 32.9 g/dL (ref 30.0–36.0)
MCV: 85.9 fL (ref 80.0–100.0)
Platelets: 346 10*3/uL (ref 150–400)
RBC: 4.82 MIL/uL (ref 3.87–5.11)
RDW: 15.2 % (ref 11.5–15.5)
WBC: 10.8 10*3/uL — ABNORMAL HIGH (ref 4.0–10.5)
nRBC: 0 % (ref 0.0–0.2)

## 2021-03-23 LAB — URINALYSIS, COMPLETE (UACMP) WITH MICROSCOPIC
Bilirubin Urine: NEGATIVE
Glucose, UA: 150 mg/dL — AB
Ketones, ur: NEGATIVE mg/dL
Nitrite: NEGATIVE
Protein, ur: NEGATIVE mg/dL
Specific Gravity, Urine: 1.011 (ref 1.005–1.030)
WBC, UA: >50 WBC/hpf — ABNORMAL HIGH (ref 0–5)
pH: 5 (ref 5.0–8.0)

## 2021-03-23 LAB — BASIC METABOLIC PANEL
Anion gap: 9 (ref 5–15)
BUN: 15 mg/dL (ref 8–23)
CO2: 24 mmol/L (ref 22–32)
Calcium: 10.6 mg/dL — ABNORMAL HIGH (ref 8.9–10.3)
Chloride: 102 mmol/L (ref 98–111)
Creatinine, Ser: 0.86 mg/dL (ref 0.44–1.00)
GFR, Estimated: >60 mL/min (ref >60)
Glucose, Bld: 132 mg/dL — ABNORMAL HIGH (ref 70–99)
Potassium: 3.7 mmol/L (ref 3.5–5.1)
Sodium: 135 mmol/L (ref 135–145)

## 2021-03-23 LAB — PROTIME-INR
INR: 1 (ref 0.8–1.2)
Prothrombin Time: 13.3 seconds (ref 11.4–15.2)

## 2021-03-23 LAB — HEPATIC FUNCTION PANEL
ALT: 41 U/L (ref 0–44)
AST: 40 U/L (ref 15–41)
Albumin: 4.2 g/dL (ref 3.5–5.0)
Alkaline Phosphatase: 129 U/L — ABNORMAL HIGH (ref 38–126)
Bilirubin, Direct: 0.1 mg/dL (ref 0.0–0.2)
Indirect Bilirubin: 0.8 mg/dL (ref 0.3–0.9)
Total Bilirubin: 0.9 mg/dL (ref 0.3–1.2)
Total Protein: 7.5 g/dL (ref 6.5–8.1)

## 2021-03-23 LAB — PROCALCITONIN: Procalcitonin: 0.11 ng/mL

## 2021-03-23 LAB — APTT: aPTT: 29 seconds (ref 24–36)

## 2021-03-23 LAB — TROPONIN I (HIGH SENSITIVITY): Troponin I (High Sensitivity): 8 ng/L (ref <18)

## 2021-03-23 LAB — BRAIN NATRIURETIC PEPTIDE: B Natriuretic Peptide: 27 pg/mL (ref 0.0–100.0)

## 2021-03-23 LAB — RESP PANEL BY RT-PCR (FLU A&B, COVID) ARPGX2
Influenza A by PCR: NEGATIVE
Influenza B by PCR: NEGATIVE
SARS Coronavirus 2 by RT PCR: NEGATIVE

## 2021-03-23 LAB — LACTIC ACID, PLASMA: Lactic Acid, Venous: 1.3 mmol/L (ref 0.5–1.9)

## 2021-03-23 MED ORDER — IPRATROPIUM-ALBUTEROL 0.5-2.5 (3) MG/3ML IN SOLN
3.0000 mL | Freq: Once | RESPIRATORY_TRACT | Status: AC
  Administered 2021-03-23: 3 mL via RESPIRATORY_TRACT

## 2021-03-23 MED ORDER — CLONAZEPAM 1 MG PO TABS
1.0000 mg | ORAL_TABLET | Freq: Every day | ORAL | Status: DC
  Administered 2021-03-23 – 2021-03-24 (×2): 1 mg via ORAL
  Filled 2021-03-23 (×2): qty 1

## 2021-03-23 MED ORDER — AZITHROMYCIN 500 MG PO TABS
500.0000 mg | ORAL_TABLET | Freq: Every day | ORAL | Status: AC
  Administered 2021-03-23: 500 mg via ORAL
  Filled 2021-03-23: qty 1

## 2021-03-23 MED ORDER — ONDANSETRON HCL 4 MG/2ML IJ SOLN: 4.0000 mg | Freq: Three times a day (TID) | INTRAMUSCULAR | Status: DC | PRN

## 2021-03-23 MED ORDER — VENLAFAXINE HCL ER 150 MG PO CP24
150.0000 mg | ORAL_CAPSULE | Freq: Every day | ORAL | Status: DC
  Administered 2021-03-23 – 2021-03-25 (×3): 150 mg via ORAL
  Filled 2021-03-23 (×4): qty 1

## 2021-03-23 MED ORDER — ACETAMINOPHEN 325 MG PO TABS: 650.0000 mg | ORAL_TABLET | Freq: Four times a day (QID) | ORAL | Status: DC | PRN

## 2021-03-23 MED ORDER — IPRATROPIUM-ALBUTEROL 0.5-2.5 (3) MG/3ML IN SOLN
3.0000 mL | RESPIRATORY_TRACT | Status: DC
  Administered 2021-03-23: 3 mL via RESPIRATORY_TRACT
  Filled 2021-03-23 (×2): qty 3

## 2021-03-23 MED ORDER — RISAQUAD PO CAPS
1.0000 | ORAL_CAPSULE | Freq: Every day | ORAL | Status: DC
  Administered 2021-03-23 – 2021-03-24 (×2): 1 via ORAL
  Filled 2021-03-23 (×3): qty 1

## 2021-03-23 MED ORDER — TIZANIDINE HCL 2 MG PO TABS
2.0000 mg | ORAL_TABLET | Freq: Every evening | ORAL | Status: DC | PRN
  Filled 2021-03-23: qty 2

## 2021-03-23 MED ORDER — AZITHROMYCIN 250 MG PO TABS
250.0000 mg | ORAL_TABLET | Freq: Every day | ORAL | Status: DC
  Administered 2021-03-24 – 2021-03-25 (×2): 250 mg via ORAL
  Filled 2021-03-23 (×2): qty 1

## 2021-03-23 MED ORDER — ENOXAPARIN SODIUM 40 MG/0.4ML IJ SOSY
40.0000 mg | PREFILLED_SYRINGE | INTRAMUSCULAR | Status: DC
  Administered 2021-03-23 – 2021-03-24 (×2): 40 mg via SUBCUTANEOUS
  Filled 2021-03-23 (×2): qty 0.4

## 2021-03-23 MED ORDER — VENLAFAXINE HCL ER 75 MG PO CP24
75.0000 mg | ORAL_CAPSULE | Freq: Every day | ORAL | Status: DC
  Administered 2021-03-23 – 2021-03-25 (×3): 75 mg via ORAL
  Filled 2021-03-23 (×4): qty 1

## 2021-03-23 MED ORDER — MIRTAZAPINE 15 MG PO TABS
15.0000 mg | ORAL_TABLET | Freq: Every day | ORAL | Status: DC
  Administered 2021-03-23 – 2021-03-24 (×2): 15 mg via ORAL
  Filled 2021-03-23 (×2): qty 1

## 2021-03-23 MED ORDER — LEVOTHYROXINE SODIUM 50 MCG PO TABS
75.0000 ug | ORAL_TABLET | Freq: Every day | ORAL | Status: DC
  Administered 2021-03-24 – 2021-03-25 (×2): 75 ug via ORAL
  Filled 2021-03-23 (×2): qty 1

## 2021-03-23 MED ORDER — FERROUS SULFATE 325 (65 FE) MG PO TABS
325.0000 mg | ORAL_TABLET | Freq: Every evening | ORAL | Status: DC
  Administered 2021-03-23 – 2021-03-24 (×2): 325 mg via ORAL
  Filled 2021-03-23 (×2): qty 1

## 2021-03-23 MED ORDER — METHYLPREDNISOLONE SODIUM SUCC 40 MG IJ SOLR
40.0000 mg | Freq: Two times a day (BID) | INTRAMUSCULAR | Status: DC
  Administered 2021-03-24 – 2021-03-25 (×2): 40 mg via INTRAVENOUS
  Filled 2021-03-23 (×3): qty 1

## 2021-03-23 MED ORDER — ALBUTEROL SULFATE (2.5 MG/3ML) 0.083% IN NEBU
2.5000 mg | INHALATION_SOLUTION | RESPIRATORY_TRACT | Status: DC | PRN
  Administered 2021-03-23: 2.5 mg via RESPIRATORY_TRACT
  Filled 2021-03-23: qty 3

## 2021-03-23 MED ORDER — PANTOPRAZOLE SODIUM 40 MG PO TBEC
40.0000 mg | DELAYED_RELEASE_TABLET | Freq: Every day | ORAL | Status: DC
  Administered 2021-03-23 – 2021-03-25 (×3): 40 mg via ORAL
  Filled 2021-03-23 (×3): qty 1

## 2021-03-23 MED ORDER — IPRATROPIUM-ALBUTEROL 0.5-2.5 (3) MG/3ML IN SOLN
RESPIRATORY_TRACT | Status: AC
  Filled 2021-03-23: qty 3

## 2021-03-23 MED ORDER — IPRATROPIUM-ALBUTEROL 0.5-2.5 (3) MG/3ML IN SOLN
3.0000 mL | Freq: Once | RESPIRATORY_TRACT | Status: AC
  Administered 2021-03-23: 3 mL via RESPIRATORY_TRACT
  Filled 2021-03-23: qty 3

## 2021-03-23 MED ORDER — ASPIRIN EC 81 MG PO TBEC
81.0000 mg | DELAYED_RELEASE_TABLET | Freq: Every evening | ORAL | Status: DC
  Administered 2021-03-23 – 2021-03-24 (×2): 81 mg via ORAL
  Filled 2021-03-23 (×2): qty 1

## 2021-03-23 MED ORDER — ATORVASTATIN CALCIUM 20 MG PO TABS
40.0000 mg | ORAL_TABLET | Freq: Every evening | ORAL | Status: DC
  Administered 2021-03-23 – 2021-03-24 (×2): 40 mg via ORAL
  Filled 2021-03-23 (×2): qty 2

## 2021-03-23 MED ORDER — METHYLPREDNISOLONE SODIUM SUCC 125 MG IJ SOLR
125.0000 mg | Freq: Once | INTRAMUSCULAR | Status: AC
  Administered 2021-03-23: 125 mg via INTRAVENOUS
  Filled 2021-03-23: qty 2

## 2021-03-23 MED ORDER — DM-GUAIFENESIN ER 30-600 MG PO TB12
1.0000 | ORAL_TABLET | Freq: Two times a day (BID) | ORAL | Status: DC | PRN
  Administered 2021-03-24: 1 via ORAL
  Filled 2021-03-23 (×2): qty 1

## 2021-03-23 NOTE — Consult Note (Signed)
CODE SEPSIS - PHARMACY COMMUNICATION  **Broad Spectrum Antibiotics should be administered within 1 hour of Sepsis diagnosis**  Time Code Sepsis Called/Page Received: no call or page received Consult entered as suspected sepsis at 1655  Antibiotics Ordered: Azithromycin  Time of 1st antibiotic administration: 1750  Additional action taken by pharmacy: none  If necessary, Name of Provider/Nurse Contacted: n/a   Wendy Murray PharmD, BCPS 03/23/2021 5:07 PM

## 2021-03-23 NOTE — ED Triage Notes (Signed)
Pt via POV from home. Pt c/o productive cough, nasal congestion, SOB, and sore throat since Tuesday. Pt does seem to be wheezing on arrival. Denies CHF, asthma, or COPD.

## 2021-03-23 NOTE — ED Provider Notes (Signed)
[facility] Emergency Department Provider Note    Event Date/Time   First MD Initiated Contact with Patient 03/23/21 1508     (approximate)  I have reviewed the triage vital signs and the nursing notes.   HISTORY  Chief Complaint Shortness of Breath    HPI Wendy Murray is a 85 y.o. female below listed past medical history presents to the ER for evaluation shortness of breath.  Symptoms developed after she had a sore throat at the end of the week then started having more nonproductive cough and feel like she is wheezing throughout.  Denies any lower extremity swelling.  Denies any chest pain.  No measured fevers has had some chills.  She took a home COVID test which was negative.  No recent antibiotics.  She denies any history of COPD or bronchitis but was a multi decade smoker until age 42.  Past Medical History:  Diagnosis Date   Diverticulosis    DVT (deep venous thrombosis) ([place]) 2012   after TKR   Hyperlipidemia    Hyperparathyroidism ([place])    Hypothyroidism    IBS (irritable bowel syndrome)    Inflammatory arthritis    Osteoarthritis, knee    Recurrent major depression ([place])    Family History  Problem Relation Age of Onset   Cancer Mother        colon   Depression Mother    Cancer Father    Depression Sister    Hypertension Neg Hx    Heart disease Neg Hx    Diabetes Neg Hx    Past Surgical History:  Procedure Laterality Date   ABDOMINAL HYSTERECTOMY  1991   CATARACT EXTRACTION W/ INTRAOCULAR LENS  IMPLANT, BILATERAL  2014   CHOLECYSTECTOMY  1998   DEXA  3/09   Normal   HAMMER TOE SURGERY  5/16   Dr Barkley Bruns   LUMBAR FUSION  1993   MENISCUS REPAIR Left 11/11   TOTAL KNEE ARTHROPLASTY Left 2012   Patient Active Problem List   Diagnosis Date Noted   Neck pain 03/12/2021   Osteoarthritis of right knee 03/12/2021   TIA (transient ischemic attack) 01/23/2021   GERD (gastroesophageal reflux disease) 12/16/2016    Hyperparathyroidism ([place])    Advance directive discussed with patient 12/13/2014   Routine general medical examination at a health care facility 12/12/2013   Hyperlipidemia    Recurrent major depression ([place])    Hypothyroidism    Osteoarthritis, knee    IBS (irritable bowel syndrome)       Prior to Admission medications   Medication Sig Start Date End Date Taking? Authorizing Provider  aspirin EC 81 MG EC tablet Take 1 tablet (81 mg total) by mouth daily. Swallow whole. 01/25/21   Lorella Nimrod, MD  atorvastatin (LIPITOR) 40 MG tablet Take 1 tablet (40 mg total) by mouth daily. 01/25/21   Lorella Nimrod, MD  clonazePAM (KLONOPIN) 1 MG tablet Take 1 tablet (1 mg total) by mouth at bedtime. 01/08/21   Kennyth Arnold, FNP  levothyroxine (SYNTHROID) 75 MCG tablet TAKE ONE TABLET BY MOUTH DAILY BEFORE BREAKFAST 07/10/20   [name] I, MD  mirtazapine (REMERON) 15 MG tablet Take 1 tablet (15 mg total) by mouth at bedtime. 07/10/20   Venia Carbon, MD  Multiple Vitamin (MULTIVITAMIN WITH MINERALS) TABS tablet Take 1 tablet by mouth daily.    [provider]  omeprazole (PRILOSEC) 20 MG capsule Take 20 mg by mouth daily.    [provider]  ondansetron (ZOFRAN ODT) 4 MG disintegrating tablet Take 1 tablet (4 mg total) by mouth every 6 (six) hours as needed for nausea or vomiting. 02/01/21   Ward, Delice Bison, DO  Probiotic Product (ALIGN) 4 MG CAPS Take 1 capsule by mouth daily.    [provider]  tiZANidine (ZANAFLEX) 4 MG tablet Take 0.5 tablets by mouth daily. 02/26/21   [provider]  venlafaxine XR (EFFEXOR-XR) 150 MG 24 hr capsule Take 1 capsule (150 mg total) by mouth daily. Pt takes with a 75mg  capsule. 07/10/20   Venia Carbon, MD  venlafaxine XR (EFFEXOR-XR) 75 MG 24 hr capsule Take 1 capsule (75 mg total) by mouth daily. Pt takes with a 150mg  capsule. 07/10/20   Venia Carbon, MD    Allergies Patient has no known allergies.    Social  History Social History   Tobacco Use   Smoking status: Former    Types: Cigarettes    Quit date: 06/16/1981    Years since quitting: 39.7   Smokeless tobacco: Never   Tobacco comments:    quit 1983  Substance Use Topics   Alcohol use: Yes    Alcohol/week: 0.0 standard drinks    Comment: rarely   Drug use: No    Review of Systems Patient denies headaches, rhinorrhea, blurry vision, numbness, shortness of breath, chest pain, edema, cough, abdominal pain, nausea, vomiting, diarrhea, dysuria, fevers, rashes or hallucinations unless otherwise stated above in HPI. ____________________________________________   PHYSICAL EXAM:  VITAL SIGNS: Vitals:   03/23/21 1600 03/23/21 1615  BP: 126/72 135/62  Pulse: 93 95  Resp: (!) 22 (!) 21  Temp:    SpO2: 91% 94%    Constitutional: Alert and oriented.  Eyes: Conjunctivae are normal.  Head: Atraumatic. Nose: No congestion/rhinnorhea. Mouth/Throat: Mucous membranes are moist.   Neck: No stridor. Painless ROM.  Cardiovascular: Normal rate, regular rhythm. Grossly normal heart sounds.  Good peripheral circulation. Respiratory: Normal respiratory effort.  No retractions. Lungs with diffuse expiratory wheeze throughout Gastrointestinal: Soft and nontender. No distention. No abdominal bruits. No CVA tenderness. Genitourinary:  Musculoskeletal: No lower extremity tenderness nor edema.  No joint effusions. Neurologic:  Normal speech and language. No gross focal neurologic deficits are appreciated. No facial droop Skin:  Skin is warm, dry and intact. No rash noted. Psychiatric: Mood and affect are normal. Speech and behavior are normal.  ____________________________________________   LABS (all labs ordered are listed, but only abnormal results are displayed)  Results for orders placed or performed during the hospital encounter of 03/23/21 (from the past 24 hour(s))  Basic metabolic panel     Status: Abnormal   Collection Time: 03/23/21   2:29 PM  Result Value Ref Range   Sodium 135 135 - 145 mmol/L   Potassium 3.7 3.5 - 5.1 mmol/L   Chloride 102 98 - 111 mmol/L   CO2 24 22 - 32 mmol/L   Glucose, Bld 132 (H) 70 - 99 mg/dL   BUN 15 8 - 23 mg/dL   Creatinine, Ser 0.86 0.44 - 1.00 mg/dL   Calcium 10.6 (H) 8.9 - 10.3 mg/dL   GFR, Estimated >60 >60 mL/min   Anion gap 9 5 - 15  CBC     Status: Abnormal   Collection Time: 03/23/21  2:29 PM  Result Value Ref Range   WBC 10.8 (H) 4.0 - 10.5 K/uL   RBC 4.82 3.87 - 5.11 MIL/uL   Hemoglobin 13.6 12.0 - 15.0 g/dL   HCT  41.4 36.0 - 46.0 %   MCV 85.9 80.0 - 100.0 fL   MCH 28.2 26.0 - 34.0 pg   MCHC 32.9 30.0 - 36.0 g/dL   RDW 15.2 11.5 - 15.5 %   Platelets 346 150 - 400 K/uL   nRBC 0.0 0.0 - 0.2 %  Hepatic function panel     Status: Abnormal   Collection Time: 03/23/21  2:29 PM  Result Value Ref Range   Total Protein 7.5 6.5 - 8.1 g/dL   Albumin 4.2 3.5 - 5.0 g/dL   AST 40 15 - 41 U/L   ALT 41 0 - 44 U/L   Alkaline Phosphatase 129 (H) 38 - 126 U/L   Total Bilirubin 0.9 0.3 - 1.2 mg/dL   Bilirubin, Direct 0.1 0.0 - 0.2 mg/dL   Indirect Bilirubin 0.8 0.3 - 0.9 mg/dL  Lactic acid, plasma     Status: None   Collection Time: 03/23/21  2:30 PM  Result Value Ref Range   Lactic Acid, Venous 1.3 0.5 - 1.9 mmol/L  Protime-INR     Status: None   Collection Time: 03/23/21  2:30 PM  Result Value Ref Range   Prothrombin Time 13.3 11.4 - 15.2 seconds   INR 1.0 0.8 - 1.2  APTT     Status: None   Collection Time: 03/23/21  2:30 PM  Result Value Ref Range   aPTT 29 24 - 36 seconds  Troponin I (High Sensitivity)     Status: None   Collection Time: 03/23/21  2:30 PM  Result Value Ref Range   Troponin I (High Sensitivity) 8 <18 ng/L  Resp Panel by RT-PCR (Flu A&B, Covid) Nasopharyngeal Swab     Status: None   Collection Time: 03/23/21  3:27 PM   Specimen: Nasopharyngeal Swab; Nasopharyngeal(NP) swabs in vial transport medium  Result Value Ref Range   SARS Coronavirus 2 by RT PCR  NEGATIVE NEGATIVE   Influenza A by PCR NEGATIVE NEGATIVE   Influenza B by PCR NEGATIVE NEGATIVE   ____________________________________________  EKG My review and personal interpretation at Time: 14:29   Indication: sob  Rate: 110  Rhythm: sinus Axis: normal Other: normal intervals, poor r wave progression, no stemi ____________________________________________  RADIOLOGY  I personally reviewed all radiographic images ordered to evaluate for the above acute complaints and reviewed radiology reports and findings.  These findings were personally discussed with the patient.  Please see medical record for radiology report.  ____________________________________________   PROCEDURES  Procedure(s) performed:  .Critical Care Performed by: Merlyn Lot, MD Authorized by: Merlyn Lot, MD   Critical care provider statement:    Critical care time (minutes):  35   Critical care time was exclusive of:  Separately billable procedures and treating other patients   Critical care was necessary to treat or prevent imminent or life-threatening deterioration of the following conditions:  Respiratory failure   Critical care was time spent personally by me on the following activities:  Development of treatment plan with patient or surrogate, discussions with consultants, evaluation of patient's response to treatment, examination of patient, obtaining history from patient or surrogate, ordering and performing treatments and interventions, ordering and review of laboratory studies, ordering and review of radiographic studies, pulse oximetry, re-evaluation of patient's condition and review of old charts    Critical Care performed: yes ____________________________________________   INITIAL IMPRESSION / [place] / ED COURSE  Pertinent labs & imaging results that were available during my care of the patient were reviewed by  me and considered in my medical decision making (see chart for  details).   DDX: Asthma, copd, CHF, pna, ptx, malignancy, Pe, anemia   BRAYLI KLINGBEIL is a 85 y.o. who presents to the ED with shortness of breath symptoms and acute respiratory failure as described above with hypoxia requiring supplemental oxygen.  Diffuse wheezing on exam.  Does not seem consistent with CHF.  I have lower suspicion for PE given diffuse wheezing with some improvement after nebulizer treatment will give steroids have given multiple nebulizer treatments and patient still mildly tachypneic though her air movement is improved.  Given her new hypoxia I do believe that admission to the hospital clinically indicated.  Her COVID test is negative.  No white count is not consistent with sepsis.  Have discussed with the patient and available family all diagnostics and treatments performed thus far and all questions were answered to the best of my ability. The patient demonstrates understanding and agreement with plan.       The patient was evaluated in Emergency Department today for the symptoms described in the history of present illness. He/she was evaluated in the context of the global COVID-19 pandemic, which necessitated consideration that the patient might be at risk for infection with the SARS-CoV-2 virus that causes COVID-19. Institutional protocols and algorithms that pertain to the evaluation of patients at risk for COVID-19 are in a state of rapid change based on information released by regulatory bodies including the CDC and federal and state organizations. These policies and algorithms were followed during the patient's care in the ED.  As part of my medical decision making, I reviewed the following data within the [facility] notes reviewed and incorporated, Labs reviewed, notes from prior ED visits and [place] Controlled Substance Database   ____________________________________________   FINAL CLINICAL IMPRESSION(S) / ED DIAGNOSES  Final diagnoses:   Acute respiratory failure with hypoxia ([place])      NEW MEDICATIONS STARTED DURING THIS VISIT:  New Prescriptions   No medications on file     Note:  This document was prepared using Dragon voice recognition software and may include unintentional dictation errors.    Merlyn Lot, MD 03/23/21 [phone]

## 2021-03-23 NOTE — ED Notes (Signed)
RN informed of bed assigned

## 2021-03-23 NOTE — ED Notes (Signed)
[place] and blood cultures sent to lab.

## 2021-03-23 NOTE — Plan of Care (Signed)

## 2021-03-23 NOTE — H&P (Addendum)
History and Physical    LURA FALOR ZDG:[id number] DOB: 25-Apr-1936 DOA: 03/23/2021  Referring MD/NP/PA:   PCP: Venia Carbon, MD   Patient coming from:  [facility].    Chief Complaint: SOB  HPI: Wendy Murray is a 85 y.o. female with medical history significant of hyperlipidemia, TIA, GERD, hypothyroid thyroidism, depression with anxiety, IBS, DVT (2012 after TKR), diverticulosis, hyperparathyroidism, former smoker, dCHF, who presents with shortness breath.  Patient states that she had sore throat and nasal congestion 4 days ago, then started having dry cough, wheezing and shortness of breath, which has been progressively worsening.  Denies chest pain.  No fever or chills.  Denies tenderness in the calf areas.  No nausea, vomiting, diarrhea or abdominal pain.  No symptoms of UTI. She denies any history of COPD, but was a multiple decade smoker until age 31.  Patient was found to have oxygen desaturation to 86% on room air, which improved to 94% on 2 L oxygen.  Patient is not using oxygen normally at home.  ED Course: pt was found to have WBC 10.8, lactic acid of 1.3, INR 1.0, PTT 29, negative COVID PCR, BNP 27, calcium 10.6, renal function okay, temperature normal, blood pressure 135/62, heart rate 106, 95, RR 25, negative chest x-ray.  Patient is admitted to progressive bed as inpatient.    Review of Systems:   General: no fevers, chills, no body weight gain, has fatigue HEENT: no blurry vision, hearing changes, has sore throat Respiratory: has dyspnea, coughing, wheezing CV: no chest pain, no palpitations GI: no nausea, vomiting, abdominal pain, diarrhea, constipation GU: no dysuria, burning on urination, increased urinary frequency, hematuria  Ext: has trace leg edema Neuro: no unilateral weakness, numbness, or tingling, no vision change or hearing loss Skin: no rash, no skin tear. MSK: No muscle spasm, no deformity, no  limitation of range of movement in spin Heme: No easy bruising.  Travel history: No recent long distant travel.  Allergy: No Known Allergies  Past Medical History:  Diagnosis Date   Diverticulosis    DVT (deep venous thrombosis) ([place]) 2012   after TKR   Hyperlipidemia    Hyperparathyroidism ([place])    Hypothyroidism    IBS (irritable bowel syndrome)    Inflammatory arthritis    Osteoarthritis, knee    Recurrent major depression ([place])     Past Surgical History:  Procedure Laterality Date   ABDOMINAL HYSTERECTOMY  1991   CATARACT EXTRACTION W/ INTRAOCULAR LENS  IMPLANT, BILATERAL  2014   CHOLECYSTECTOMY  1998   DEXA  3/09   Normal   HAMMER TOE SURGERY  5/16   Dr Barkley Bruns   LUMBAR FUSION  1993   MENISCUS REPAIR Left 11/11   TOTAL KNEE ARTHROPLASTY Left 2012    Social History:  reports that she quit smoking about 39 years ago. Her smoking use included cigarettes. She has never used smokeless tobacco. She reports current alcohol use. She reports that she does not use drugs.  Family History:  Family History  Problem Relation Age of Onset   Cancer Mother        colon   Depression Mother    Cancer Father    Depression Sister    Hypertension Neg Hx    Heart disease Neg Hx    Diabetes Neg Hx      Prior to Admission medications   Medication Sig Start Date End Date Taking? Authorizing Provider  aspirin EC 81  MG EC tablet Take 1 tablet (81 mg total) by mouth daily. Swallow whole. 01/25/21   Lorella Nimrod, MD  atorvastatin (LIPITOR) 40 MG tablet Take 1 tablet (40 mg total) by mouth daily. 01/25/21   Lorella Nimrod, MD  clonazePAM (KLONOPIN) 1 MG tablet Take 1 tablet (1 mg total) by mouth at bedtime. 01/08/21   Kennyth Arnold, FNP  levothyroxine (SYNTHROID) 75 MCG tablet TAKE ONE TABLET BY MOUTH DAILY BEFORE BREAKFAST 07/10/20   [name] I, MD  mirtazapine (REMERON) 15 MG tablet Take 1 tablet (15 mg total) by mouth at bedtime. 07/10/20   Venia Carbon, MD  Multiple Vitamin  (MULTIVITAMIN WITH MINERALS) TABS tablet Take 1 tablet by mouth daily.    [provider]  omeprazole (PRILOSEC) 20 MG capsule Take 20 mg by mouth daily.    [provider]  ondansetron (ZOFRAN ODT) 4 MG disintegrating tablet Take 1 tablet (4 mg total) by mouth every 6 (six) hours as needed for nausea or vomiting. 02/01/21   Ward, Delice Bison, DO  Probiotic Product (ALIGN) 4 MG CAPS Take 1 capsule by mouth daily.    [provider]  tiZANidine (ZANAFLEX) 4 MG tablet Take 0.5 tablets by mouth daily. 02/26/21   [provider]  venlafaxine XR (EFFEXOR-XR) 150 MG 24 hr capsule Take 1 capsule (150 mg total) by mouth daily. Pt takes with a 75mg  capsule. 07/10/20   Venia Carbon, MD  venlafaxine XR (EFFEXOR-XR) 75 MG 24 hr capsule Take 1 capsule (75 mg total) by mouth daily. Pt takes with a 150mg  capsule. 07/10/20   Venia Carbon, MD    Physical Exam: Vitals:   03/23/21 1530 03/23/21 1545 03/23/21 1600 03/23/21 1615  BP: (!) 145/83 130/66 126/72 135/62  Pulse: 96 97 93 95  Resp: 17 20 (!) 22 (!) 21  Temp:      TempSrc:      SpO2: 99% 94% 91% 94%  Weight:      Height:       General: Not in acute distress HEENT:       Eyes: PERRL, EOMI, no scleral icterus.       ENT: No discharge from the ears and nose, no pharynx injection, no tonsillar enlargement.        Neck: No JVD, no bruit, no mass felt. Heme: No neck lymph node enlargement. Cardiac: S1/S2, RRR, No murmurs, No gallops or rubs. Respiratory: Has wheezing bilaterally with loud coarse breathing sound bilaterally GI: Soft, nondistended, nontender, no rebound pain, no organomegaly, BS present. GU: No hematuria Ext: has trace leg edema bilaterally. 1+DP/PT pulse bilaterally. Musculoskeletal: No joint deformities, No joint redness or warmth, no limitation of ROM in spin. Skin: No rashes.  Neuro: Alert, oriented X3, cranial nerves II-XII grossly intact, moves all extremities normally.  Psych: Patient is  not psychotic, no suicidal or hemocidal ideation.  Labs on Admission: I have personally reviewed following labs and imaging studies  CBC: Recent Labs  Lab 03/23/21 1429  WBC 10.8*  HGB 13.6  HCT 41.4  MCV 85.9  PLT 595   Basic Metabolic Panel: Recent Labs  Lab 03/23/21 1429  NA 135  K 3.7  CL 102  CO2 24  GLUCOSE 132*  BUN 15  CREATININE 0.86  CALCIUM 10.6*   GFR: Estimated Creatinine Clearance: 53.1 mL/min (by C-G formula based on SCr of 0.86 mg/dL). Liver Function Tests: Recent Labs  Lab 03/23/21 1429  AST 40  ALT 41  ALKPHOS 129*  BILITOT 0.9  PROT 7.5  ALBUMIN 4.2   No results for input(s): LIPASE, AMYLASE in the last 168 hours. No results for input(s): AMMONIA in the last 168 hours. Coagulation Profile: Recent Labs  Lab 03/23/21 1430  INR 1.0   Cardiac Enzymes: No results for input(s): CKTOTAL, CKMB, CKMBINDEX, TROPONINI in the last 168 hours. BNP (last 3 results) No results for input(s): PROBNP in the last 8760 hours. HbA1C: No results for input(s): HGBA1C in the last 72 hours. CBG: No results for input(s): GLUCAP in the last 168 hours. Lipid Profile: No results for input(s): CHOL, HDL, LDLCALC, TRIG, CHOLHDL, LDLDIRECT in the last 72 hours. Thyroid Function Tests: No results for input(s): TSH, T4TOTAL, FREET4, T3FREE, THYROIDAB in the last 72 hours. Anemia Panel: No results for input(s): VITAMINB12, FOLATE, FERRITIN, TIBC, IRON, RETICCTPCT in the last 72 hours. Urine analysis:    Component Value Date/Time   COLORURINE YELLOW (A) 06/10/2015 1010   APPEARANCEUR CLEAR (A) 06/10/2015 1010   LABSPEC 1.006 06/10/2015 1010   PHURINE 6.0 06/10/2015 1010   GLUCOSEU NEGATIVE 06/10/2015 1010   HGBUR 1+ (A) 06/10/2015 1010   BILIRUBINUR NEGATIVE 06/10/2015 1010   KETONESUR NEGATIVE 06/10/2015 1010   PROTEINUR NEGATIVE 06/10/2015 1010   NITRITE NEGATIVE 06/10/2015 1010   LEUKOCYTESUR NEGATIVE 06/10/2015 1010   Sepsis  Labs: @LABRCNTIP (procalcitonin:4,lacticidven:4) ) Recent Results (from the past 240 hour(s))  Resp Panel by RT-PCR (Flu A&B, Covid) Nasopharyngeal Swab     Status: None   Collection Time: 03/23/21  3:27 PM   Specimen: Nasopharyngeal Swab; Nasopharyngeal(NP) swabs in vial transport medium  Result Value Ref Range Status   SARS Coronavirus 2 by RT PCR NEGATIVE NEGATIVE Final    Comment: (NOTE) SARS-CoV-2 target nucleic acids are NOT DETECTED.  The SARS-CoV-2 RNA is generally detectable in upper respiratory specimens during the acute phase of infection. The lowest concentration of SARS-CoV-2 viral copies this assay can detect is 138 copies/mL. A negative result does not preclude SARS-Cov-2 infection and should not be used as the sole basis for treatment or other patient management decisions. A negative result may occur with  improper specimen collection/handling, submission of specimen other than nasopharyngeal swab, presence of viral mutation(s) within the areas targeted by this assay, and inadequate number of viral copies(<138 copies/mL). A negative result must be combined with clinical observations, patient history, and epidemiological information. The expected result is Negative.  Fact Sheet for Patients:  EntrepreneurPulse.com.au  Fact Sheet for Healthcare Providers:  IncredibleEmployment.be  This test is no t yet approved or cleared by the [place] FDA and  has been authorized for detection and/or diagnosis of SARS-CoV-2 by FDA under an Emergency Use Authorization (EUA). This EUA will remain  in effect (meaning this test can be used) for the duration of the COVID-19 declaration under Section 564(b)(1) of the Act, 21 U.S.C.section 360bbb-3(b)(1), unless the authorization is terminated  or revoked sooner.       Influenza A by PCR NEGATIVE NEGATIVE Final   Influenza B by PCR NEGATIVE NEGATIVE Final    Comment: (NOTE) The Xpert Xpress  SARS-CoV-2/FLU/RSV plus assay is intended as an aid in the diagnosis of influenza from Nasopharyngeal swab specimens and should not be used as a sole basis for treatment. Nasal washings and aspirates are unacceptable for Xpert Xpress SARS-CoV-2/FLU/RSV testing.  Fact Sheet for Patients: EntrepreneurPulse.com.au  Fact Sheet for Healthcare Providers: IncredibleEmployment.be  This test is not yet approved or cleared by the [place] FDA and has been authorized for detection  and/or diagnosis of SARS-CoV-2 by FDA under an Emergency Use Authorization (EUA). This EUA will remain in effect (meaning this test can be used) for the duration of the COVID-19 declaration under Section 564(b)(1) of the Act, 21 U.S.C. section 360bbb-3(b)(1), unless the authorization is terminated or revoked.  Performed at [facility], [address] [place], [place] [address]      Radiological Exams on Admission: DG Chest 2 View  Result Date: 03/23/2021 CLINICAL DATA:  Short of breath, productive cough, wheezing, sore throat for 4 days EXAM: CHEST - 2 VIEW COMPARISON:  06/10/2015 FINDINGS: Frontal and lateral views of the chest demonstrate an unremarkable cardiac silhouette. No acute airspace disease, effusion, or pneumothorax. No acute bony abnormalities. Small hiatal hernia seen in the medial left lung base. IMPRESSION: 1. No acute intrathoracic process. Electronically Signed   By: Randa Ngo M.D.   On: 03/23/2021 15:33     EKG: I have personally reviewed.  Sinus rhythm, QTC 429, nonspecific T wave.  Assessment/Plan Principal Problem:   Acute respiratory failure with hypoxia (HCC) Active Problems:   Hyperlipidemia   Hypothyroidism   Hyperparathyroidism (HCC)   TIA (transient ischemic attack)   Sepsis (HCC)   Chronic diastolic CHF (congestive heart failure) (HCC)   Acute respiratory failure with hypoxia (HCC) and sepsis: Patient's chest x-ray is  negative for infiltration. She has wheezing and loud coarse breathing sound on lung auscultation, indicating acute bronchitis.  Another possibility is undiagnosed COPD with acute exacerbation since patient was a longtime former smoker.  Patient had a history of DVT which was provoked after knee replacement 2012, but patient does not have tenderness in the calf areas, no any chest pain, I have very low suspicions for PE since we have clear explanation for her shortness breath.  Patient  meets criteria for sepsis with tachycardia with heart rate 106 and tachypnea with RR 25.  Lactic acid isnormal.  Has mild leukocytosis, but no fever.  Currently hemodynamically stable  -will admit to med-surg as inpatient -Bronchodilators -Solu-Medrol 40 mg IV bid -Z pak  -Mucinex for cough  -Incentive spirometry -Follow up blood culture x2, sputum culture -Nasal cannula oxygen as needed to maintain O2 saturation 93% or greater -will get Procalcitonin -IVF: will not give IVF due to normal lactic acid and hx of dCHF  Chronic diastolic CHF: Patient does not seem to be aware of this diagnosis.  2D echo on 01/24/2021 showed EF 60 to 65% with grade 3 diastolic dysfunction.  Patient has trace leg edema, but no JVD, no pulm edema chest x-ray, BNP is 27, clinically no CHF exacerbation. -Watch volume status closely  Hyperlipidemia -lipitor  Hypothyroidism -Synthroid  Hyperparathyroidism ([place]): Calcium 10.6 today, slightly elevated -No acute issues  TIA (transient ischemic attack) -Aspirin, Lipitor    DVT ppx: SQ Lovenox Code Status: Partial code (I discussed with the patient, and explained the meaning of CODE STATUS, patient wants to be partial code, OK for CPR, but no intubation). Her daughter is aware of this.  Family Communication: yes, patient's daughter by phone Disposition Plan:  Anticipate discharge back to previous environment Consults called:  none Admission status and Level of care: Progressive  Cardiac:    as inpt      Status is: Inpatient  Remains inpatient appropriate because:Inpatient level of care appropriate due to severity of illness  Dispo: The patient is from: retirement center.                Anticipated d/c is to:  retirement center.                Patient currently is not medically stable to d/c.   Difficult to place patient No        Date of Service 03/23/2021    Ivor Costa [facility]   If 7PM-7AM, please contact night-coverage [email or web address] 03/23/2021, 5:41 PM

## 2021-03-24 DIAGNOSIS — J9601 Acute respiratory failure with hypoxia: Secondary | ICD-10-CM | POA: Diagnosis not present

## 2021-03-24 LAB — CBC
HCT: 39.1 % (ref 36.0–46.0)
Hemoglobin: 12.5 g/dL (ref 12.0–15.0)
MCH: 27.4 pg (ref 26.0–34.0)
MCHC: 32 g/dL (ref 30.0–36.0)
MCV: 85.7 fL (ref 80.0–100.0)
Platelets: 333 10*3/uL (ref 150–400)
RBC: 4.56 MIL/uL (ref 3.87–5.11)
RDW: 15.1 % (ref 11.5–15.5)
WBC: 7.7 10*3/uL (ref 4.0–10.5)
nRBC: 0 % (ref 0.0–0.2)

## 2021-03-24 MED ORDER — IPRATROPIUM-ALBUTEROL 0.5-2.5 (3) MG/3ML IN SOLN
3.0000 mL | Freq: Four times a day (QID) | RESPIRATORY_TRACT | Status: DC
  Administered 2021-03-24 – 2021-03-25 (×5): 3 mL via RESPIRATORY_TRACT
  Filled 2021-03-24 (×5): qty 3

## 2021-03-24 NOTE — Plan of Care (Signed)
Patient sleeping between care. Aox4. Denies pain. No new changes in assessment. Oxygen on at 2L [place]. Call bell within reach. Bed alarm on.  PLAN OF CARE ONGOING Problem: Education: Goal: Ability to demonstrate management of disease process will improve Outcome: Progressing Goal: Ability to verbalize understanding of medication therapies will improve Outcome: Progressing Goal: Individualized Educational Video(s) Outcome: Progressing   Problem: Activity: Goal: Capacity to carry out activities will improve Outcome: Progressing   Problem: Cardiac: Goal: Ability to achieve and maintain adequate cardiopulmonary perfusion will improve Outcome: Progressing   Problem: Education: Goal: Knowledge of General Education information will improve Description: Including pain rating scale, medication(s)/side effects and non-pharmacologic comfort measures Outcome: Progressing   Problem: Health Behavior/Discharge Planning: Goal: Ability to manage health-related needs will improve Outcome: Progressing   Problem: Clinical Measurements: Goal: Ability to maintain clinical measurements within normal limits will improve Outcome: Progressing Goal: Will remain free from infection Outcome: Progressing Goal: Diagnostic test results will improve Outcome: Progressing Goal: Respiratory complications will improve Outcome: Progressing Goal: Cardiovascular complication will be avoided Outcome: Progressing   Problem: Activity: Goal: Risk for activity intolerance will decrease Outcome: Progressing   Problem: Nutrition: Goal: Adequate nutrition will be maintained Outcome: Progressing   Problem: Coping: Goal: Level of anxiety will decrease Outcome: Progressing   Problem: Elimination: Goal: Will not experience complications related to bowel motility Outcome: Progressing Goal: Will not experience complications related to urinary retention Outcome: Progressing   Problem: Pain Managment: Goal: General  experience of comfort will improve Outcome: Progressing   Problem: Safety: Goal: Ability to remain free from injury will improve Outcome: Progressing   Problem: Skin Integrity: Goal: Risk for impaired skin integrity will decrease Outcome: Progressing

## 2021-03-24 NOTE — Progress Notes (Addendum)
[facility] NAME: Karl Knarr    MR#:  [id number]  DATE OF BIRTH:  1936-05-16  SUBJECTIVE:  came in with increasing shortness of breath and cough. Patient was wheezing during admission currently has occasional wheezing. Sats more than 92% on room air. Transiently dipped down to 86 on ambulation however recovered very quickly. Does not wear oxygen at baseline. No family in the room.  REVIEW OF SYSTEMS:   Review of Systems  Constitutional:  Negative for chills, fever and weight loss.  HENT:  Negative for ear discharge, ear pain and nosebleeds.   Eyes:  Negative for blurred vision, pain and discharge.  Respiratory:  Positive for cough and shortness of breath. Negative for sputum production, wheezing and stridor.   Cardiovascular:  Negative for chest pain, palpitations, orthopnea and PND.  Gastrointestinal:  Negative for abdominal pain, diarrhea, nausea and vomiting.  Genitourinary:  Negative for frequency and urgency.  Musculoskeletal:  Negative for back pain and joint pain.  Neurological:  Negative for sensory change, speech change, focal weakness and weakness.  Psychiatric/Behavioral:  Negative for depression and hallucinations. The patient is not nervous/anxious.   Tolerating Diet:yes Tolerating PT: No PT f/u  DRUG ALLERGIES:  No Known Allergies  VITALS:  Blood pressure (!) 146/88, pulse 93, temperature 97.6 F (36.4 C), temperature source Oral, resp. rate 18, height 5\' 5"  (1.651 m), weight 82.9 kg, SpO2 93 %.  PHYSICAL EXAMINATION:   Physical Exam  GENERAL:  85 y.o.-year-old patient lying in the bed with no acute distress.  LUNGS: coarse breath sounds bilaterally, no wheezing, rales, rhonchi. No use of accessory muscles of respiration.  CARDIOVASCULAR: S1, S2 normal. No murmurs, rubs, or gallops.  ABDOMEN: Soft, nontender, nondistended. Bowel sounds present. No organomegaly or mass.  EXTREMITIES: No cyanosis, clubbing  or edema b/l.    NEUROLOGIC: Cranial nerves II through XII are intact. No focal Motor or sensory deficits b/l.   PSYCHIATRIC:  patient is alert and oriented x 3.  SKIN: No obvious rash, lesion, or ulcer.   LABORATORY PANEL:  CBC Recent Labs  Lab 03/24/21 0436  WBC 7.7  HGB 12.5  HCT 39.1  PLT 333    Chemistries  Recent Labs  Lab 03/23/21 1429  NA 135  K 3.7  CL 102  CO2 24  GLUCOSE 132*  BUN 15  CREATININE 0.86  CALCIUM 10.6*  AST 40  ALT 41  ALKPHOS 129*  BILITOT 0.9   Cardiac Enzymes No results for input(s): TROPONINI in the last 168 hours. RADIOLOGY:  DG Chest 2 View  Result Date: 03/23/2021 CLINICAL DATA:  Short of breath, productive cough, wheezing, sore throat for 4 days EXAM: CHEST - 2 VIEW COMPARISON:  06/10/2015 FINDINGS: Frontal and lateral views of the chest demonstrate an unremarkable cardiac silhouette. No acute airspace disease, effusion, or pneumothorax. No acute bony abnormalities. Small hiatal hernia seen in the medial left lung base. IMPRESSION: 1. No acute intrathoracic process. Electronically Signed   By: Randa Ngo M.D.   On: 03/23/2021 15:33   ASSESSMENT AND PLAN:  IVERY MICHALSKI is a 85 y.o. female with medical history significant of hyperlipidemia, TIA, GERD, hypothyroid thyroidism, depression with anxiety, IBS, DVT (2012 after TKR), diverticulosis, hyperparathyroidism, former smoker, dCHF, who presents with shortness breath.  Patient states that she had sore throat and nasal congestion 4 days ago, then started having dry cough, wheezing and shortness of breath, which has been progressively worsening.  Acute respiratory  failure with hypoxia (HCC) and sepsis:  --Patient's chest x-ray is negative for infiltration. She has wheezing and loud coarse breathing sound on lung auscultation, indicating acute bronchitis.  Another possibility is undiagnosed COPD with acute exacerbation since patient was a longtime former smoker.  --  Lactic acid  isnormal.  Has mild leukocytosis, but no fever.  Currently hemodynamically stable  -Bronchodilators -Solu-Medrol 40 mg IV bid--change to po  -Z pak  -Mucinex for cough  -Incentive spirometry -Follow up blood culture x2 Neg -Nasal cannula oxygen as needed to maintain O2 saturation 93% or greater--wean as able -Procalcitonin 0.11   Chronic diastolic CHF:  -  2D echo on 01/24/2021 showed EF 60 to 65% with grade 3 diastolic dysfunction.  -- Patient has trace leg edema, but no JVD, no pulm edema chest x-ray, BNP is 27, clinically no CHF exacerbation. -Watch volume status closely   Hyperlipidemia -lipitor   Hypothyroidism -Synthroid   Hyperparathyroidism ([place]): Calcium 10.6 today, slightly elevated -No acute issues   TIA (transient ischemic attack) -Aspirin, Lipitor       DVT ppx: SQ Lovenox Code Status: Partial code (I discussed with the patient, and explained the meaning of CODE STATUS, patient wants to be partial code, OK for CPR, but no intubation). Her daughter is aware of this--per Dr Blaine Hamper  Family Communication: none today Disposition Plan:  Anticipate discharge back to previous environment Consults called:  none Admission status as inpt        Status is: Inpatient   Remains inpatient appropriate because:Inpatient level of care appropriate due to severity of illness   Dispo: The patient is from: retirement center.                Anticipated d/c is to: [facility]                Patient currently is not medically stable to d/c.              Difficult to place patient No       TOTAL TIME TAKING CARE OF THIS PATIENT: 25 minutes.  >50% time spent on counselling and coordination of care  Note: This dictation was prepared with Dragon dictation along with smaller phrase technology. Any transcriptional errors that result from this process are unintentional.  Fritzi Mandes M.D    [facility]   CC: Primary care physician; Venia Carbon, MD Patient  ID: [name], female   DOB: 1936-01-01, 85 y.o.   MRN: [id number]

## 2021-03-24 NOTE — Evaluation (Signed)
Physical Therapy Evaluation Patient Details Name: Wendy Murray MRN: [id number] DOB: Nov 13, 1935 Today's Date: 03/24/2021  History of Present Illness  Pt is a 85 y.o. female who presents with shortness of breath/hypoxia. PMH: Hypothyroidism, osteoarthritis, depression, IBS. No acute findings on CT & MRI of head  Clinical Impression  Pt very pleasant and motivated t/o PT session.  She was able to do bed mobility and transfers w/o assist or hesitation.  She was able to circumambulate the nurses' station X 2 on room air w/o AD and though she had a few bouts of stagger stepping she did not need any direct assist to maintain balance and reports feeling relatively close to her baseline ambulation.  Pt confident in ability to return home safely and agrees that she does not need further PT f/u.  On arrival pt on 2L with sats >95%, stayed in the 90s on room air with bed mobility, strength testing, etc; O2 stayed in the 88-92% range (on room air ) t/o ambulation and pt did not have any episodes of DOE/SOB and only mild subjective fatigue with the effort.       Recommendations for follow up therapy are one component of a multi-disciplinary discharge planning process, led by the attending physician.  Recommendations may be updated based on patient status, additional functional criteria and insurance authorization.  Follow Up Recommendations No PT follow up    Equipment Recommendations  None recommended by PT    Recommendations for Other Services       Precautions / Restrictions Precautions Precautions: Fall Restrictions Weight Bearing Restrictions: No      Mobility  Bed Mobility Overal bed mobility: Modified Independent             General bed mobility comments: Pt able to easily get to sitting EOB from supine    Transfers Overall transfer level: Modified independent               General transfer comment: Pt able to rise without hesitation and minimal UE  reliance  Ambulation/Gait Ambulation/Gait assistance: Supervision Gait Distance (Feet): 350 Feet Assistive device: None       General Gait Details: Pt able to circumambulate the nurses' station X 2 w/o AD and w/o excessive fatigue.  She did have occasional stagger steps that she was able to self arrest.  She was on room air t/o the effort with sats in the 88-92% range t/o the effort - no shortness of breath or excessive fatigue.  Stairs            Wheelchair Mobility    Modified Rankin (Stroke Patients Only)       Balance Overall balance assessment: Modified Independent                                           Pertinent Vitals/Pain Pain Assessment: No/denies pain    Home Living Family/patient expects to be discharged to:: Private residence Living Arrangements: Spouse/significant other Available Help at Discharge: Family Type of Home: Independent living facility Home Access: Level entry     Home Layout: One level Home Equipment: Grab bars - toilet;Grab bars - tub/shower      Prior Function Level of Independence: Independent         Comments: Indep with ADLs, household and community mobilization without assist device     Hand Dominance  Extremity/Trunk Assessment   Upper Extremity Assessment Upper Extremity Assessment: Generalized weakness;Overall [facility] for tasks assessed    Lower Extremity Assessment Lower Extremity Assessment: Generalized weakness;Overall WFL for tasks assessed       Communication   Communication: No difficulties  Cognition Arousal/Alertness: Awake/alert Behavior During Therapy: WFL for tasks assessed/performed Overall Cognitive Status: Within Functional Limits for tasks assessed                                        General Comments      Exercises     Assessment/Plan    PT Assessment Patent does not need any further PT services  PT Problem List         PT Treatment  Interventions      PT Goals (Current goals can be found in the Care Plan section)  Acute Rehab PT Goals Patient Stated Goal: go home PT Goal Formulation: All assessment and education complete, DC therapy    Frequency     Barriers to discharge        Co-evaluation               AM-PAC PT "6 Clicks" Mobility  Outcome Measure Help needed turning from your back to your side while in a flat bed without using bedrails?: None Help needed moving from lying on your back to sitting on the side of a flat bed without using bedrails?: None Help needed moving to and from a bed to a chair (including a wheelchair)?: None Help needed standing up from a chair using your arms (e.g., wheelchair or bedside chair)?: None Help needed to walk in hospital room?: None Help needed climbing 3-5 steps with a railing? : A Little 6 Click Score: 23    End of Session Equipment Utilized During Treatment: Gait belt Activity Tolerance: Patient tolerated treatment well Patient left: with chair alarm set;with call bell/phone within reach Nurse Communication: Mobility status PT Visit Diagnosis: Muscle weakness (generalized) (M62.81);Difficulty in walking, not elsewhere classified (R26.2)    Time: 5638-9373 PT Time Calculation (min) (ACUTE ONLY): 27 min   Charges:   PT Evaluation $PT Eval Low Complexity: 1 Low PT Treatments $Gait Training: 8-22 mins        Kreg Shropshire, DPT 03/24/2021, 11:51 AM

## 2021-03-25 ENCOUNTER — Telehealth: Payer: Self-pay | Admitting: Internal Medicine

## 2021-03-25 DIAGNOSIS — J40 Bronchitis, not specified as acute or chronic: Secondary | ICD-10-CM

## 2021-03-25 DIAGNOSIS — E785 Hyperlipidemia, unspecified: Secondary | ICD-10-CM | POA: Diagnosis not present

## 2021-03-25 DIAGNOSIS — E039 Hypothyroidism, unspecified: Secondary | ICD-10-CM | POA: Diagnosis not present

## 2021-03-25 DIAGNOSIS — J9601 Acute respiratory failure with hypoxia: Secondary | ICD-10-CM | POA: Diagnosis not present

## 2021-03-25 DIAGNOSIS — J45909 Unspecified asthma, uncomplicated: Secondary | ICD-10-CM

## 2021-03-25 MED ORDER — ALBUTEROL SULFATE HFA 108 (90 BASE) MCG/ACT IN AERS: 2.0000 | INHALATION_SPRAY | Freq: Four times a day (QID) | RESPIRATORY_TRACT | 2 refills | Status: DC | PRN

## 2021-03-25 MED ORDER — AZITHROMYCIN 250 MG PO TABS: 250.0000 mg | ORAL_TABLET | Freq: Every day | ORAL | 0 refills | Status: AC

## 2021-03-25 MED ORDER — PREDNISONE 20 MG PO TABS: 40.0000 mg | ORAL_TABLET | Freq: Every day | ORAL | 0 refills | Status: AC

## 2021-03-25 MED ORDER — PREDNISONE 50 MG PO TABS: 50.0000 mg | ORAL_TABLET | Freq: Every day | ORAL | Status: DC

## 2021-03-25 MED ORDER — DM-GUAIFENESIN ER 30-600 MG PO TB12: 1.0000 | ORAL_TABLET | Freq: Two times a day (BID) | ORAL | 0 refills | Status: DC | PRN

## 2021-03-25 NOTE — Telephone Encounter (Signed)
[facility] called to schedule a HFU, no appt available within in the next week.

## 2021-03-25 NOTE — Telephone Encounter (Signed)
Called and scheduled pt for 10/18

## 2021-03-25 NOTE — Telephone Encounter (Signed)
Dr Silvio Pate is checking his messages throughout the week. I will forward to him to see what days next week we can add her to.

## 2021-03-25 NOTE — Discharge Summary (Signed)
[place] [facility] [place] NAME: Wendy Murray    MR#:  [id number]  DATE OF BIRTH:  [birth year]  DATE OF ADMISSION:  03/23/2021 ADMITTING PHYSICIAN: Ivor Costa, MD  DATE OF DISCHARGE: 03/25/2021  PRIMARY CARE PHYSICIAN: Venia Carbon, MD    ADMISSION DIAGNOSIS:  Acute respiratory failure with hypoxia ([place]) [J96.01]  DISCHARGE DIAGNOSIS:  Acute Bronchitis Acute respiratory failure with hypoxia--Improving  SECONDARY DIAGNOSIS:   Past Medical History:  Diagnosis Date   Diverticulosis    DVT (deep venous thrombosis) ([place]) 2012   after TKR   Hyperlipidemia    Hyperparathyroidism ([place])    Hypothyroidism    IBS (irritable bowel syndrome)    Inflammatory arthritis    Osteoarthritis, knee    Recurrent major depression [facility])     HOSPITAL COURSE:  Wendy Murray is a 85 y.o. female with medical history significant of hyperlipidemia, TIA, GERD, hypothyroid thyroidism, depression with anxiety, IBS, DVT (2012 after TKR), diverticulosis, hyperparathyroidism, former smoker, dCHF, who presents with shortness breath.  Patient states that she had sore throat and nasal congestion 4 days ago, then started having dry cough, wheezing and shortness of breath, which has been progressively worsening.   Acute respiratory failure with hypoxia (HCC) and sepsis:  --Patient's chest x-ray is negative for infiltration. She has wheezing and loud coarse breathing sound on lung auscultation, indicating acute bronchitis.  Another possibility is undiagnosed COPD with acute exacerbation since patient was a longtime former smoker.  --  Lactic acid isnormal.  Has mild leukocytosis, but no fever.  Currently hemodynamically stable  -Bronchodilators -Solu-Medrol 40 mg IV bid--change to po 40 mg x 3days -Z pak  -Mucinex prn for cough  -Incentive spirometry -Follow up blood culture x2 Neg -Nasal cannula oxygen as needed to maintain O2 saturation 93% or greater--wean  to RA -Procalcitonin 0.11    Chronic diastolic CHF:  -  2D echo on 01/24/2021 showed EF 60 to 65% with grade 3 diastolic dysfunction.  -Patient has trace leg edema, but no JVD, no pulm edema chest x-ray, BNP is 27, clinically no CHF exacerbation. -UOP ~2500 cc   Hyperlipidemia -lipitor   Hypothyroidism -Synthroid   H/o TIA (transient ischemic attack) -Aspirin, Lipitor       DVT ppx: SQ Lovenox Code Status: Partial code (I discussed with the patient, and explained the meaning of CODE STATUS, patient wants to be partial code, OK for CPR, but no intubation). Her daughter is aware of this--per Dr Blaine Hamper  Family Communication: left message for dter PEG Disposition Plan:  Anticipate discharge back to previous environment Consults called:  none Admission status as inpt          Dispo: The [facility].                Anticipated d/c is to: [place] [facility]                Patient currently is not medically stable    CONSULTS OBTAINED:    DRUG ALLERGIES:  No Known Allergies  DISCHARGE MEDICATIONS:   Allergies as of 03/25/2021   No Known Allergies      Medication List     STOP taking these medications    ondansetron 4 MG disintegrating tablet Commonly known as: Zofran ODT       TAKE these medications    albuterol 108 (90 Base) MCG/ACT inhaler Commonly known as: VENTOLIN HFA Inhale 2 puffs into the lungs every  6 (six) hours as needed for wheezing or shortness of breath.   Align 4 MG Caps Take 1 capsule by mouth at bedtime.   aspirin 81 MG EC tablet Take 1 tablet (81 mg total) by mouth daily. Swallow whole. What changed: when to take this   atorvastatin 40 MG tablet Commonly known as: LIPITOR Take 1 tablet (40 mg total) by mouth daily. What changed: when to take this   azithromycin 250 MG tablet Commonly known as: ZITHROMAX Take 1 tablet (250 mg total) by mouth daily for 3 days.   clonazePAM 1 MG tablet Commonly known as:  KLONOPIN Take 1 tablet (1 mg total) by mouth at bedtime.   dextromethorphan-guaiFENesin 30-600 MG 12hr tablet Commonly known as: MUCINEX DM Take 1 tablet by mouth 2 (two) times daily as needed for cough.   ferrous sulfate 325 (65 FE) MG tablet Take 325 mg by mouth every evening.   levothyroxine 75 MCG tablet Commonly known as: SYNTHROID TAKE ONE TABLET BY MOUTH DAILY BEFORE BREAKFAST   mirtazapine 15 MG tablet Commonly known as: REMERON Take 1 tablet (15 mg total) by mouth at bedtime.   omeprazole 20 MG capsule Commonly known as: PRILOSEC Take 20 mg by mouth every evening.   predniSONE 20 MG tablet Commonly known as: DELTASONE Take 2 tablets (40 mg total) by mouth daily with breakfast for 3 days.   tiZANidine 4 MG tablet Commonly known as: ZANAFLEX Take 2-4 mg by mouth at bedtime as needed for muscle spasms.   venlafaxine XR 150 MG 24 hr capsule Commonly known as: EFFEXOR-XR Take 1 capsule (150 mg total) by mouth daily. Pt takes with a 75mg  capsule.   venlafaxine XR 75 MG 24 hr capsule Commonly known as: EFFEXOR-XR Take 1 capsule (75 mg total) by mouth daily. Pt takes with a 150mg  capsule.        If you experience worsening of your admission symptoms, develop shortness of breath, life threatening emergency, suicidal or homicidal thoughts you must seek medical attention immediately by calling 911 or calling your MD immediately  if symptoms less severe.  You Must read complete instructions/literature along with all the possible adverse reactions/side effects for all the Medicines you take and that have been prescribed to you. Take any new Medicines after you have completely understood and accept all the possible adverse reactions/side effects.   Please note  You were cared for by a hospitalist during your hospital stay. If you have any questions about your discharge medications or the care you received while you were in the hospital after you are discharged, you can call  the unit and asked to speak with the hospitalist on call if the hospitalist that took care of you is not available. Once you are discharged, your primary care physician will handle any further medical issues. Please note that NO REFILLS for any discharge medications will be authorized once you are discharged, as it is imperative that you return to your primary care physician (or establish a relationship with a primary care physician if you do not have one) for your aftercare needs so that they can reassess your need for medications and monitor your lab values. Today   SUBJECTIVE   Some cough. No wheezing, breathing better  VITAL SIGNS:  Blood pressure (!) 160/73, pulse 93, temperature 97.6 F (36.4 C), resp. rate 15, height 5\' 5"  (1.651 m), weight 82.9 kg, SpO2 94 %.  I/O:   Intake/Output Summary (Last 24 hours) at 03/25/2021 0901 Last data filed  at 03/25/2021 0119 Gross per 24 hour  Intake 480 ml  Output 1500 ml  Net -1020 ml    PHYSICAL EXAMINATION:  GENERAL:  85 y.o.-year-old patient lying in the bed with no acute distress.  LUNGS: Normal breath sounds bilaterally, occ  wheezing, no rales,rhonchi or crepitation. No use of accessory muscles of respiration.  CARDIOVASCULAR: S1, S2 normal. No murmurs, rubs, or gallops.  ABDOMEN: Soft, non-tender, non-distended. Bowel sounds present. No organomegaly or mass.  EXTREMITIES: No pedal edema, cyanosis, or clubbing.  NEUROLOGIC: Cranial nerves II through XII are intact. Muscle strength 5/5 in all extremities. Sensation intact. Gait not checked.  PSYCHIATRIC:  patient is alert and oriented x 3.  SKIN: No obvious rash, lesion, or ulcer.   DATA REVIEW:   CBC  Recent Labs  Lab 03/24/21 0436  WBC 7.7  HGB 12.5  HCT 39.1  PLT 333    Chemistries  Recent Labs  Lab 03/23/21 1429  NA 135  K 3.7  CL 102  CO2 24  GLUCOSE 132*  BUN 15  CREATININE 0.86  CALCIUM 10.6*  AST 40  ALT 41  ALKPHOS 129*  BILITOT 0.9    Microbiology  Results   Recent Results (from the past 240 hour(s))  Blood culture (routine single)     Status: None (Preliminary result)   Collection Time: 03/23/21  2:39 PM   Specimen: BLOOD  Result Value Ref Range Status   Specimen Description BLOOD RIGHT ANTECUBITAL  Final   Special Requests   Final    BOTTLES DRAWN AEROBIC AND ANAEROBIC Blood Culture adequate volume   Culture   Final    NO GROWTH 2 DAYS Performed at [facility], [address], [place], [place] [address]    Report Status PENDING  Incomplete  Resp Panel by RT-PCR (Flu A&B, Covid) Nasopharyngeal Swab     Status: None   Collection Time: 03/23/21  3:27 PM   Specimen: Nasopharyngeal Swab; Nasopharyngeal(NP) swabs in vial transport medium  Result Value Ref Range Status   SARS Coronavirus 2 by RT PCR NEGATIVE NEGATIVE Final    Comment: (NOTE) SARS-CoV-2 target nucleic acids are NOT DETECTED.  The SARS-CoV-2 RNA is generally detectable in upper respiratory specimens during the acute phase of infection. The lowest concentration of SARS-CoV-2 viral copies this assay can detect is 138 copies/mL. A negative result does not preclude SARS-Cov-2 infection and should not be used as the sole basis for treatment or other patient management decisions. A negative result may occur with  improper specimen collection/handling, submission of specimen other than nasopharyngeal swab, presence of viral mutation(s) within the areas targeted by this assay, and inadequate number of viral copies(<138 copies/mL). A negative result must be combined with clinical observations, patient history, and epidemiological information. The expected result is Negative.  Fact Sheet for Patients:  EntrepreneurPulse.com.au  Fact Sheet for Healthcare Providers:  IncredibleEmployment.be  This test is no t yet approved or cleared by the [place] FDA and  has been authorized for detection and/or diagnosis of  SARS-CoV-2 by FDA under an Emergency Use Authorization (EUA). This EUA will remain  in effect (meaning this test can be used) for the duration of the COVID-19 declaration under Section 564(b)(1) of the Act, 21 U.S.C.section 360bbb-3(b)(1), unless the authorization is terminated  or revoked sooner.       Influenza A by PCR NEGATIVE NEGATIVE Final   Influenza B by PCR NEGATIVE NEGATIVE Final    Comment: (NOTE) The Xpert Xpress SARS-CoV-2/FLU/RSV plus assay  is intended as an aid in the diagnosis of influenza from Nasopharyngeal swab specimens and should not be used as a sole basis for treatment. Nasal washings and aspirates are unacceptable for Xpert Xpress SARS-CoV-2/FLU/RSV testing.  Fact Sheet for Patients: EntrepreneurPulse.com.au  Fact Sheet for Healthcare Providers: IncredibleEmployment.be  This test is not yet approved or cleared by the [place] FDA and has been authorized for detection and/or diagnosis of SARS-CoV-2 by FDA under an Emergency Use Authorization (EUA). This EUA will remain in effect (meaning this test can be used) for the duration of the COVID-19 declaration under Section 564(b)(1) of the Act, 21 U.S.C. section 360bbb-3(b)(1), unless the authorization is terminated or revoked.  Performed at [facility], [address] [place], [place] [address]     RADIOLOGY:  DG Chest 2 View  Result Date: 03/23/2021 CLINICAL DATA:  Short of breath, productive cough, wheezing, sore throat for 4 days EXAM: CHEST - 2 VIEW COMPARISON:  06/10/2015 FINDINGS: Frontal and lateral views of the chest demonstrate an unremarkable cardiac silhouette. No acute airspace disease, effusion, or pneumothorax. No acute bony abnormalities. Small hiatal hernia seen in the medial left lung base. IMPRESSION: 1. No acute intrathoracic process. Electronically Signed   By: Randa Ngo M.D.   On: 03/23/2021 15:33     CODE STATUS:     Code  Status Orders  (From admission, onward)           Start     Ordered   03/23/21 1754  Limited resuscitation (code)  Continuous       Question Answer Comment  In the event of cardiac or respiratory ARREST: Perform CPR Yes   In the event of cardiac or respiratory ARREST: Perform Intubation/Mechanical Ventilation No   In the event of cardiac or respiratory ARREST: Perform Defibrillation or Cardioversion if indicated Yes   Antiarrhythmic drugs - Any drug used to treat arrhythmias Yes   Vasoactive drug - Any drug used to stabilize blood pressure Yes      03/23/21 1753           Code Status History     Date Active Date Inactive Code Status Order ID Comments User Context   01/23/2021 0238 01/24/2021 2315 Full Code [id number]  Athena Masse, MD ED   06/10/2015 1452 06/16/2015 1550 Full Code [id number]  Harrie Foreman, MD Inpatient      Advance Directive Documentation    Flowsheet Row Most Recent Value  Type of Advance Directive Healthcare Power of Attorney  Pre-existing out of facility DNR order (yellow form or pink MOST form) --  "MOST" Form in Place? --        TOTAL TIME TAKING CARE OF THIS PATIENT: 35 minutes.    Fritzi Mandes M.D  [facility]    CC: Primary care physician; Venia Carbon, MD

## 2021-03-25 NOTE — TOC Progression Note (Signed)
Transition of Care [facility]) - Progression Note    Patient Details  Name: Wendy Murray MRN: [id number] Date of Birth: July 21, 1935  [facility]) CM/SW [place], RN Phone Number: 03/25/2021, 9:34 AM  Clinical Narrative:   Met with The patient at the bedside to discuss DC plan and needs, she drives and lives at [facility] independent, plans to return there , She has DME at home and does not have any needs she stated         Expected Discharge Plan and Services           Expected Discharge Date: 03/25/21                                     Social Determinants of Health (SDOH) Interventions    Readmission Risk Interventions No flowsheet data found.

## 2021-03-25 NOTE — Progress Notes (Signed)
Discharge Note: Reviewed discharge instructions with pt. Pt verbalized understanding. Pt discharged to home with personal belongings, purse, medications from pharmacy, and an incentive spirometer. Iv cath intact upon removal.  Staff wheeled pt out. Pt transported self from [facility] to home via private vehicle.

## 2021-03-28 LAB — CULTURE, BLOOD (SINGLE)
Culture: NO GROWTH
Special Requests: ADEQUATE

## 2021-04-02 ENCOUNTER — Other Ambulatory Visit: Payer: Self-pay

## 2021-04-02 ENCOUNTER — Encounter: Payer: Self-pay | Admitting: Internal Medicine

## 2021-04-02 ENCOUNTER — Ambulatory Visit (INDEPENDENT_AMBULATORY_CARE_PROVIDER_SITE_OTHER): Payer: Medicare Other | Admitting: Internal Medicine

## 2021-04-02 DIAGNOSIS — J4521 Mild intermittent asthma with (acute) exacerbation: Secondary | ICD-10-CM

## 2021-04-02 DIAGNOSIS — I5032 Chronic diastolic (congestive) heart failure: Secondary | ICD-10-CM

## 2021-04-02 DIAGNOSIS — J9601 Acute respiratory failure with hypoxia: Secondary | ICD-10-CM

## 2021-04-02 DIAGNOSIS — M542 Cervicalgia: Secondary | ICD-10-CM

## 2021-04-02 MED ORDER — TIZANIDINE HCL 4 MG PO TABS: 2.0000 mg | ORAL_TABLET | Freq: Every evening | ORAL | 1 refills | Status: DC | PRN

## 2021-04-02 NOTE — Assessment & Plan Note (Signed)
No exacerbated BNP was normal Weight stable

## 2021-04-02 NOTE — Assessment & Plan Note (Signed)
This seems to be resolved

## 2021-04-02 NOTE — Progress Notes (Signed)
Subjective:    Patient ID: Wendy Murray, female    DOB: [birth year], 85 y.o.   MRN: [id number]  HPI Here for hospital follow up This visit occurred during the SARS-CoV-2 public health emergency.  Safety protocols were in place, including screening questions prior to the visit, additional usage of staff PPE, and extensive cleaning of exam room while observing appropriate contact time as indicated for disinfecting solutions.   Reviewed her hospital stay Had hypoxic respiratory failure Loud wheezing CXR didn't show pneumonia---COVID negative Treated with steroids and azithromycin  Still with chest congestion Wheezing some still May have been helped by mucinex Using incentive spirometer  No longer having SOB--just congested Albuterol helps temporarily  25 pack year smoking history---quit 1983  Current Outpatient Medications on File Prior to Visit  Medication Sig Dispense Refill   albuterol (VENTOLIN HFA) 108 (90 Base) MCG/ACT inhaler Inhale 2 puffs into the lungs every 6 (six) hours as needed for wheezing or shortness of breath. 8 g 2   aspirin EC 81 MG EC tablet Take 1 tablet (81 mg total) by mouth daily. Swallow whole. (Patient taking differently: Take 81 mg by mouth every evening. Swallow whole.) 30 tablet 11   atorvastatin (LIPITOR) 40 MG tablet Take 1 tablet (40 mg total) by mouth daily. (Patient taking differently: Take 40 mg by mouth every evening.) 90 tablet 1   clonazePAM (KLONOPIN) 1 MG tablet Take 1 tablet (1 mg total) by mouth at bedtime. 90 tablet 0   dextromethorphan-guaiFENesin (MUCINEX DM) 30-600 MG 12hr tablet Take 1 tablet by mouth 2 (two) times daily as needed for cough. 14 tablet 0   ferrous sulfate 325 (65 FE) MG tablet Take 325 mg by mouth every evening.     levothyroxine (SYNTHROID) 75 MCG tablet TAKE ONE TABLET BY MOUTH DAILY BEFORE BREAKFAST 90 tablet 3   mirtazapine (REMERON) 15 MG tablet Take 1 tablet (15 mg total) by mouth at bedtime. 90 tablet 3    omeprazole (PRILOSEC) 20 MG capsule Take 20 mg by mouth every evening.     Probiotic Product (ALIGN) 4 MG CAPS Take 1 capsule by mouth at bedtime.     tiZANidine (ZANAFLEX) 4 MG tablet Take 2-4 mg by mouth at bedtime as needed for muscle spasms.     venlafaxine XR (EFFEXOR-XR) 150 MG 24 hr capsule Take 1 capsule (150 mg total) by mouth daily. Pt takes with a 75mg  capsule. 90 capsule 3   venlafaxine XR (EFFEXOR-XR) 75 MG 24 hr capsule Take 1 capsule (75 mg total) by mouth daily. Pt takes with a 150mg  capsule. 90 capsule 3   No current facility-administered medications on file prior to visit.    No Known Allergies  Past Medical History:  Diagnosis Date   Diverticulosis    DVT (deep venous thrombosis) ([place]) 2012   after TKR   Hyperlipidemia    Hyperparathyroidism ([place])    Hypothyroidism    IBS (irritable bowel syndrome)    Inflammatory arthritis    Osteoarthritis, knee    Recurrent major depression ([place])     Past Surgical History:  Procedure Laterality Date   ABDOMINAL HYSTERECTOMY  1991   CATARACT EXTRACTION W/ INTRAOCULAR LENS  IMPLANT, BILATERAL  2014   CHOLECYSTECTOMY  1998   DEXA  3/09   Normal   HAMMER TOE SURGERY  5/16   Dr Barkley Bruns   LUMBAR FUSION  1993   MENISCUS REPAIR Left 11/11   TOTAL KNEE ARTHROPLASTY Left 2012    Family History  Problem Relation Age of Onset   Cancer Mother        colon   Depression Mother    Cancer Father    Depression Sister    Hypertension Neg Hx    Heart disease Neg Hx    Diabetes Neg Hx     Social History   Socioeconomic History   Marital status: Married    Spouse name: Not on file   Number of children: 3   Years of education: Not on file   Highest education level: Not on file  Occupational History   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    Comment: Retired  Tobacco Use   Smoking status: Former    Types: Cigarettes    Quit date: 06/16/1981    Years since quitting: 39.8   Smokeless tobacco: Never   Tobacco  comments:    quit 1983  Substance and Sexual Activity   Alcohol use: Yes    Alcohol/week: 0.0 standard drinks    Comment: rarely   Drug use: No   Sexual activity: Not Currently  Other Topics Concern   Not on file  Social History Narrative   [place] in [place]      Has living will    Husband, then daughter Joycelyn Schmid, have health care POA   Has DNR ---order rewritten   No feeding tube if cognitively unaware   Will be donating body to [place] Determinants of Health   Financial Resource Strain: Not on file  Food Insecurity: Not on file  Transportation Needs: Not on file  Physical Activity: Not on file  Stress: Not on file  Social Connections: Not on file  Intimate Partner Violence: Not on file   Review of Systems No fever No edema No chest pain--just "full" Appetite is okay Sleeping okay--using humidifier at night    Objective:   Physical Exam Constitutional:      Appearance: Normal appearance.  Cardiovascular:     Rate and Rhythm: Normal rate and regular rhythm.     Heart sounds: No murmur heard.   No gallop.  Pulmonary:     Effort: Pulmonary effort is normal.     Breath sounds: No rhonchi or rales.     Comments: Fair air movement Not tight but slight expiratory wheeze (scattered) Musculoskeletal:     Cervical back: Neck supple.     Right lower leg: No edema.     Left lower leg: No edema.  Lymphadenopathy:     Cervical: No cervical adenopathy.  Neurological:     Mental Status: She is alert.           Assessment & Plan:

## 2021-04-02 NOTE — Assessment & Plan Note (Signed)
Reviewed hospital records Clear wheezing and chest tightness---no pneumonia but seems to have been infectious (?viral) Some concern about undiagnosed emphysema--but that seems unlikely given 40 years since she smoked  Will hold off on more steroids Continue albuterol prn--discussed using spacer Okay mucinex prn Consider pulmonary referral if persistent problems

## 2021-04-02 NOTE — Assessment & Plan Note (Signed)
Has come back Will refill the tizanidine

## 2021-04-08 ENCOUNTER — Ambulatory Visit (INDEPENDENT_AMBULATORY_CARE_PROVIDER_SITE_OTHER): Payer: Medicare Other | Admitting: Internal Medicine

## 2021-04-08 ENCOUNTER — Encounter: Payer: Self-pay | Admitting: Internal Medicine

## 2021-04-08 ENCOUNTER — Other Ambulatory Visit: Payer: Self-pay

## 2021-04-08 VITALS — BP 140/86 | HR 97 | Temp 97.6°F | Ht 65.0 in | Wt 184.0 lb

## 2021-04-08 DIAGNOSIS — R350 Frequency of micturition: Secondary | ICD-10-CM | POA: Diagnosis not present

## 2021-04-08 LAB — POC URINALSYSI DIPSTICK (AUTOMATED)
Bilirubin, UA: NEGATIVE
Glucose, UA: NEGATIVE
Ketones, UA: NEGATIVE
Nitrite, UA: POSITIVE
Protein, UA: POSITIVE — AB
Spec Grav, UA: 1.02 (ref 1.010–1.025)
Urobilinogen, UA: 0.2 E.U./dL
pH, UA: 5.5 (ref 5.0–8.0)

## 2021-04-08 MED ORDER — SULFAMETHOXAZOLE-TRIMETHOPRIM 400-80 MG PO TABS: 1.0000 | ORAL_TABLET | Freq: Two times a day (BID) | ORAL | 1 refills | Status: DC

## 2021-04-08 NOTE — Progress Notes (Signed)
Subjective:    Patient ID: Wendy Murray, female    DOB: [birth year], 85 y.o.   MRN: [id number]  HPI Here due to urinary symptoms This visit occurred during the SARS-CoV-2 public health emergency.  Safety protocols were in place, including screening questions prior to the visit, additional usage of staff PPE, and extensive cleaning of exam room while observing appropriate contact time as indicated for disinfecting solutions.   Has had some urinary symptoms for a couple of weeks Regular urinary infections when younger Has urinary frequency---nocturia last night x 6! Urine is cloudy Now with low back pain  No dysuria No hematuria Low grade fever---100 yesterday  Hasn't taken anything for this  Current Outpatient Medications on File Prior to Visit  Medication Sig Dispense Refill   albuterol (VENTOLIN HFA) 108 (90 Base) MCG/ACT inhaler Inhale 2 puffs into the lungs every 6 (six) hours as needed for wheezing or shortness of breath. 8 g 2   aspirin EC 81 MG EC tablet Take 1 tablet (81 mg total) by mouth daily. Swallow whole. (Patient taking differently: Take 81 mg by mouth every evening. Swallow whole.) 30 tablet 11   atorvastatin (LIPITOR) 40 MG tablet Take 1 tablet (40 mg total) by mouth daily. (Patient taking differently: Take 40 mg by mouth every evening.) 90 tablet 1   clonazePAM (KLONOPIN) 1 MG tablet Take 1 tablet (1 mg total) by mouth at bedtime. 90 tablet 0   dextromethorphan-guaiFENesin (MUCINEX DM) 30-600 MG 12hr tablet Take 1 tablet by mouth 2 (two) times daily as needed for cough. 14 tablet 0   ferrous sulfate 325 (65 FE) MG tablet Take 325 mg by mouth every evening.     levothyroxine (SYNTHROID) 75 MCG tablet TAKE ONE TABLET BY MOUTH DAILY BEFORE BREAKFAST 90 tablet 3   mirtazapine (REMERON) 15 MG tablet Take 1 tablet (15 mg total) by mouth at bedtime. 90 tablet 3   omeprazole (PRILOSEC) 20 MG capsule Take 20 mg by mouth every evening.     Probiotic Product (ALIGN) 4 MG  CAPS Take 1 capsule by mouth at bedtime.     tiZANidine (ZANAFLEX) 4 MG tablet Take 0.5-1 tablets (2-4 mg total) by mouth at bedtime as needed for muscle spasms. 60 tablet 1   venlafaxine XR (EFFEXOR-XR) 150 MG 24 hr capsule Take 1 capsule (150 mg total) by mouth daily. Pt takes with a 75mg  capsule. 90 capsule 3   venlafaxine XR (EFFEXOR-XR) 75 MG 24 hr capsule Take 1 capsule (75 mg total) by mouth daily. Pt takes with a 150mg  capsule. 90 capsule 3   No current facility-administered medications on file prior to visit.    No Known Allergies  Past Medical History:  Diagnosis Date   Diverticulosis    DVT (deep venous thrombosis) ([place]) 2012   after TKR   Hyperlipidemia    Hyperparathyroidism ([place])    Hypothyroidism    IBS (irritable bowel syndrome)    Inflammatory arthritis    Osteoarthritis, knee    Recurrent major depression ([place])     Past Surgical History:  Procedure Laterality Date   ABDOMINAL HYSTERECTOMY  1991   CATARACT EXTRACTION W/ INTRAOCULAR LENS  IMPLANT, BILATERAL  2014   CHOLECYSTECTOMY  1998   DEXA  3/09   Normal   HAMMER TOE SURGERY  5/16   Dr Barkley Bruns   LUMBAR FUSION  1993   MENISCUS REPAIR Left 11/11   TOTAL KNEE ARTHROPLASTY Left 2012    Family History  Problem Relation Age  of Onset   Cancer Mother        colon   Depression Mother    Cancer Father    Depression Sister    Hypertension Neg Hx    Heart disease Neg Hx    Diabetes Neg Hx     Social History   Socioeconomic History   Marital status: Married    Spouse name: Not on file   Number of children: 3   Years of education: Not on file   Highest education level: Not on file  Occupational History   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    Comment: Retired  Tobacco Use   Smoking status: Former    Types: Cigarettes    Quit date: 06/16/1981    Years since quitting: 39.8   Smokeless tobacco: Never   Tobacco comments:    quit 1983  Substance and Sexual Activity   Alcohol use: Yes     Alcohol/week: 0.0 standard drinks    Comment: rarely   Drug use: No   Sexual activity: Not Currently  Other Topics Concern   Not on file  Social History Narrative   Children in [place]      Has living will    Husband, then daughter Joycelyn Schmid, have health care POA   Has DNR ---order rewritten   No feeding tube if cognitively unaware   Will be donating body to [place] Determinants of Health   Financial Resource Strain: Not on file  Food Insecurity: Not on file  Transportation Needs: Not on file  Physical Activity: Not on file  Stress: Not on file  Social Connections: Not on file  Intimate Partner Violence: Not on file   Review of Systems No N/V Eating okay    Objective:   Physical Exam Constitutional:      Appearance: Normal appearance.  Abdominal:     Palpations: Abdomen is soft.     Tenderness: There is no abdominal tenderness. There is no right CVA tenderness or left CVA tenderness.  Neurological:     Mental Status: She is alert.           Assessment & Plan:

## 2021-04-08 NOTE — Addendum Note (Signed)
Addended by: Pilar Grammes on: 04/08/2021 03:28 PM   Modules accepted: Orders

## 2021-04-08 NOTE — Assessment & Plan Note (Signed)
No clear cut symptoms of infection but did have a lot of nocturia last night Urinalysis shows some leuks---as well as minor blood and nitrite  Will try empiric sulfa (low dose) Send culture

## 2021-04-11 LAB — URINE CULTURE
MICRO NUMBER:: 12542074
SPECIMEN QUALITY:: ADEQUATE

## 2021-04-17 ENCOUNTER — Other Ambulatory Visit: Payer: Self-pay | Admitting: Family

## 2021-05-14 DIAGNOSIS — E669 Obesity, unspecified: Secondary | ICD-10-CM | POA: Diagnosis not present

## 2021-05-14 DIAGNOSIS — M1711 Unilateral primary osteoarthritis, right knee: Secondary | ICD-10-CM | POA: Diagnosis not present

## 2021-05-23 ENCOUNTER — Encounter: Payer: Self-pay | Admitting: Internal Medicine

## 2021-05-24 ENCOUNTER — Telehealth: Payer: Self-pay | Admitting: Internal Medicine

## 2021-05-24 ENCOUNTER — Encounter: Payer: Self-pay | Admitting: Internal Medicine

## 2021-05-24 NOTE — Telephone Encounter (Signed)
Wendy Murray with [facility] called checking on the status of the Surgical Clearance for pt knee surgery. Please advise.

## 2021-05-27 NOTE — Telephone Encounter (Signed)
See MyChart message. Pt needs to schedule surgical clearance appointment. I tried to call [facility] but the wait was too long.

## 2021-05-29 ENCOUNTER — Other Ambulatory Visit: Payer: Self-pay

## 2021-05-29 ENCOUNTER — Encounter: Payer: Self-pay | Admitting: Internal Medicine

## 2021-05-29 ENCOUNTER — Ambulatory Visit (INDEPENDENT_AMBULATORY_CARE_PROVIDER_SITE_OTHER): Payer: Medicare Other | Admitting: Internal Medicine

## 2021-05-29 DIAGNOSIS — I5032 Chronic diastolic (congestive) heart failure: Secondary | ICD-10-CM | POA: Diagnosis not present

## 2021-05-29 DIAGNOSIS — M1711 Unilateral primary osteoarthritis, right knee: Secondary | ICD-10-CM

## 2021-05-29 DIAGNOSIS — F3340 Major depressive disorder, recurrent, in remission, unspecified: Secondary | ICD-10-CM

## 2021-05-29 NOTE — Progress Notes (Signed)
Subjective:    Patient ID: Wendy Murray, female    DOB: 1936/05/26, 85 y.o.   MRN: [id number]  HPI Here for preop clearance for proposed right TKR by Dr Roland Rack  Having lots of stress now Husband just home from hospital---needed Rx for bladder tumor (but spread and he needs immunotherapy) Couldn't replace stent so needed nephrostomy tube He now has delirium again and wife having trouble handling the tube, etc Will need SNF  Breathing is back to normal Not able to exercise--not comfortable leaving her husband No problems with household tasks---groceries are delivered. She does laundry and cleaning--no dyspnea No chest pain No palpitations No edema  Sleeps in bed---2 pillows. No PND  Current Outpatient Medications on File Prior to Visit  Medication Sig Dispense Refill   albuterol (VENTOLIN HFA) 108 (90 Base) MCG/ACT inhaler Inhale 2 puffs into the lungs every 6 (six) hours as needed for wheezing or shortness of breath. 8 g 2   aspirin EC 81 MG EC tablet Take 1 tablet (81 mg total) by mouth daily. Swallow whole. (Patient taking differently: Take 81 mg by mouth every evening. Swallow whole.) 30 tablet 11   atorvastatin (LIPITOR) 40 MG tablet Take 1 tablet (40 mg total) by mouth daily. (Patient taking differently: Take 40 mg by mouth every evening.) 90 tablet 1   clonazePAM (KLONOPIN) 1 MG tablet TAKE ONE TABLET BY MOUTH EVERY NIGHT AT BEDTIME 90 tablet 0   ferrous sulfate 325 (65 FE) MG tablet Take 325 mg by mouth every evening.     levothyroxine (SYNTHROID) 75 MCG tablet TAKE ONE TABLET BY MOUTH DAILY BEFORE BREAKFAST 90 tablet 3   mirtazapine (REMERON) 15 MG tablet Take 1 tablet (15 mg total) by mouth at bedtime. 90 tablet 3   omeprazole (PRILOSEC) 20 MG capsule Take 20 mg by mouth every evening.     Probiotic Product (ALIGN) 4 MG CAPS Take 1 capsule by mouth at bedtime.     tiZANidine (ZANAFLEX) 4 MG tablet Take 0.5-1 tablets (2-4 mg total) by mouth at bedtime as needed for  muscle spasms. 60 tablet 1   venlafaxine XR (EFFEXOR-XR) 150 MG 24 hr capsule Take 1 capsule (150 mg total) by mouth daily. Pt takes with a 75mg  capsule. 90 capsule 3   venlafaxine XR (EFFEXOR-XR) 75 MG 24 hr capsule Take 1 capsule (75 mg total) by mouth daily. Pt takes with a 150mg  capsule. 90 capsule 3   No current facility-administered medications on file prior to visit.    No Known Allergies  Past Medical History:  Diagnosis Date   Diverticulosis    DVT (deep venous thrombosis) ([place]) 2012   after TKR   Hyperlipidemia    Hyperparathyroidism ([place])    Hypothyroidism    IBS (irritable bowel syndrome)    Inflammatory arthritis    Osteoarthritis, knee    Recurrent major depression ([place])     Past Surgical History:  Procedure Laterality Date   ABDOMINAL HYSTERECTOMY  1991   CATARACT EXTRACTION W/ INTRAOCULAR LENS  IMPLANT, BILATERAL  2014   CHOLECYSTECTOMY  1998   DEXA  3/09   Normal   HAMMER TOE SURGERY  5/16   Dr Barkley Bruns   LUMBAR FUSION  1993   MENISCUS REPAIR Left 11/11   TOTAL KNEE ARTHROPLASTY Left 2012    Family History  Problem Relation Age of Onset   Cancer Mother        colon   Depression Mother    Cancer Father  Depression Sister    Hypertension Neg Hx    Heart disease Neg Hx    Diabetes Neg Hx     Social History   Socioeconomic History   Marital status: Married    Spouse name: Not on file   Number of children: 3   Years of education: Not on file   Highest education level: Not on file  Occupational History   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    Comment: Retired  Tobacco Use   Smoking status: Former    Types: Cigarettes    Quit date: 06/16/1981    Years since quitting: 39.9   Smokeless tobacco: Never   Tobacco comments:    quit 1983  Substance and Sexual Activity   Alcohol use: Yes    Alcohol/week: 0.0 standard drinks    Comment: rarely   Drug use: No   Sexual activity: Not Currently  Other Topics Concern   Not on file   Social History Narrative   [place] in [place]      Has living will    Husband, then daughter Joycelyn Schmid, have health care POA   Has DNR ---order rewritten   No feeding tube if cognitively unaware   Will be donating body to [place] Determinants of Health   Financial Resource Strain: Not on file  Food Insecurity: Not on file  Transportation Needs: Not on file  Physical Activity: Not on file  Stress: Not on file  Social Connections: Not on file  Intimate Partner Violence: Not on file   Review of Systems Eating okay Weight stable Did have small DVT after left TKR (15 years ago). No other abnormal blood clots    Objective:   Physical Exam Constitutional:      Appearance: Normal appearance.  Cardiovascular:     Rate and Rhythm: Normal rate and regular rhythm.     Heart sounds: No murmur heard.   No gallop.  Pulmonary:     Effort: Pulmonary effort is normal.     Breath sounds: Normal breath sounds. No wheezing or rales.  Musculoskeletal:     Cervical back: Neck supple.     Right lower leg: No edema.     Left lower leg: No edema.  Lymphadenopathy:     Cervical: No cervical adenopathy.  Neurological:     Mental Status: She is alert.  Psychiatric:        Mood and Affect: Mood normal.        Behavior: Behavior normal.           Assessment & Plan:

## 2021-05-29 NOTE — Assessment & Plan Note (Signed)
This is compensated now without Rx Normal EF on last echo in the spring I would recommend diuretics as needed to maintain neutral fluid balance in the perioperative period

## 2021-05-29 NOTE — Assessment & Plan Note (Addendum)
She is ready to proceed with TKR She has some risks but is optimally ready for the procedure from medical standpoint  Had small DVT 15 years ago after left TKR---should get standard doses of xarelto or eliquis for post-op DVT prophylaxis. Hold aspirin when on NOAC Will likely need rehab after procedure

## 2021-05-29 NOTE — Assessment & Plan Note (Signed)
This is controlled on venlafaxine/mirtazapine and should not be a factor in her recovery

## 2021-05-30 DIAGNOSIS — U071 COVID-19: Secondary | ICD-10-CM

## 2021-05-30 HISTORY — DX: COVID-19: U07.1

## 2021-05-31 ENCOUNTER — Encounter: Payer: Self-pay | Admitting: Internal Medicine

## 2021-05-31 MED ORDER — MOLNUPIRAVIR 200 MG PO CAPS: 4.0000 | ORAL_CAPSULE | Freq: Two times a day (BID) | ORAL | 0 refills | Status: DC

## 2021-06-01 DIAGNOSIS — Z20828 Contact with and (suspected) exposure to other viral communicable diseases: Secondary | ICD-10-CM | POA: Diagnosis not present

## 2021-06-11 ENCOUNTER — Encounter: Payer: Self-pay | Admitting: Internal Medicine

## 2021-06-13 ENCOUNTER — Other Ambulatory Visit: Payer: Self-pay | Admitting: Surgery

## 2021-06-14 ENCOUNTER — Encounter: Payer: Self-pay | Admitting: Internal Medicine

## 2021-06-18 ENCOUNTER — Encounter: Payer: Self-pay | Admitting: Internal Medicine

## 2021-06-28 ENCOUNTER — Encounter
Admission: RE | Admit: 2021-06-28 | Discharge: 2021-06-28 | Disposition: A | Discharge status: Home / Self Care | Payer: Medicare Other | Source: Ambulatory Visit | Attending: Surgery | Admitting: Surgery

## 2021-06-28 ENCOUNTER — Other Ambulatory Visit: Payer: Self-pay

## 2021-06-28 VITALS — BP 144/89 | HR 96 | Temp 98.3°F | Resp 18 | Ht 65.0 in | Wt 182.0 lb

## 2021-06-28 DIAGNOSIS — Z01810 Encounter for preprocedural cardiovascular examination: Secondary | ICD-10-CM | POA: Diagnosis not present

## 2021-06-28 DIAGNOSIS — Z01818 Encounter for other preprocedural examination: Secondary | ICD-10-CM | POA: Diagnosis not present

## 2021-06-28 DIAGNOSIS — Z01812 Encounter for preprocedural laboratory examination: Secondary | ICD-10-CM

## 2021-06-28 LAB — COMPREHENSIVE METABOLIC PANEL
ALT: 20 U/L (ref 0–44)
AST: 20 U/L (ref 15–41)
Albumin: 4 g/dL (ref 3.5–5.0)
Alkaline Phosphatase: 104 U/L (ref 38–126)
Anion gap: 9 (ref 5–15)
BUN: 18 mg/dL (ref 8–23)
CO2: 26 mmol/L (ref 22–32)
Calcium: 10.3 mg/dL (ref 8.9–10.3)
Chloride: 103 mmol/L (ref 98–111)
Creatinine, Ser: 0.8 mg/dL (ref 0.44–1.00)
GFR, Estimated: >60 mL/min (ref >60)
Glucose, Bld: 107 mg/dL — ABNORMAL HIGH (ref 70–99)
Potassium: 3.6 mmol/L (ref 3.5–5.1)
Sodium: 138 mmol/L (ref 135–145)
Total Bilirubin: 0.7 mg/dL (ref 0.3–1.2)
Total Protein: 6.6 g/dL (ref 6.5–8.1)

## 2021-06-28 LAB — URINALYSIS, ROUTINE W REFLEX MICROSCOPIC
Bilirubin Urine: NEGATIVE
Glucose, UA: NEGATIVE mg/dL
Hgb urine dipstick: NEGATIVE
Ketones, ur: NEGATIVE mg/dL
Nitrite: NEGATIVE
Protein, ur: NEGATIVE mg/dL
Specific Gravity, Urine: 1.026 (ref 1.005–1.030)
pH: 5 (ref 5.0–8.0)

## 2021-06-28 LAB — CBC WITH DIFFERENTIAL/PLATELET
Abs Immature Granulocytes: 0.03 10*3/uL (ref 0.00–0.07)
Basophils Absolute: 0 10*3/uL (ref 0.0–0.1)
Basophils Relative: 0 %
Eosinophils Absolute: 0.2 10*3/uL (ref 0.0–0.5)
Eosinophils Relative: 1 %
HCT: 41.4 % (ref 36.0–46.0)
Hemoglobin: 13 g/dL (ref 12.0–15.0)
Immature Granulocytes: 0 %
Lymphocytes Relative: 16 %
Lymphs Abs: 1.9 10*3/uL (ref 0.7–4.0)
MCH: 27.1 pg (ref 26.0–34.0)
MCHC: 31.4 g/dL (ref 30.0–36.0)
MCV: 86.4 fL (ref 80.0–100.0)
Monocytes Absolute: 1 10*3/uL (ref 0.1–1.0)
Monocytes Relative: 9 %
Neutro Abs: 8.5 10*3/uL — ABNORMAL HIGH (ref 1.7–7.7)
Neutrophils Relative %: 74 %
Platelets: 368 10*3/uL (ref 150–400)
RBC: 4.79 MIL/uL (ref 3.87–5.11)
RDW: 15 % (ref 11.5–15.5)
WBC: 11.6 10*3/uL — ABNORMAL HIGH (ref 4.0–10.5)
nRBC: 0 % (ref 0.0–0.2)

## 2021-06-28 LAB — TYPE AND SCREEN
ABO/RH(D): O POS
Antibody Screen: NEGATIVE

## 2021-06-28 LAB — SURGICAL PCR SCREEN
MRSA, PCR: NEGATIVE
Staphylococcus aureus: NEGATIVE

## 2021-06-28 NOTE — Patient Instructions (Addendum)
Your procedure is scheduled on: Tuesday, January 24 Report to the Registration Desk on the 1st floor of the [facility]. To find out your arrival time, please call [phone] between 1PM - 3PM on: Monday, January 23  REMEMBER: Instructions that are not followed completely may result in serious medical risk, up to and including death; or upon the discretion of your surgeon and anesthesiologist your surgery may need to be rescheduled.  Do not eat food after midnight the night before surgery.  No gum chewing, lozengers or hard candies.  You may however, drink CLEAR liquids up to 2 hours before you are scheduled to arrive for your surgery. Do not drink anything within 2 hours of your scheduled arrival time.  Clear liquids include: - water  - apple juice without pulp - gatorade (not RED, PURPLE, OR BLUE) - black coffee or tea (Do NOT add milk or creamers to the coffee or tea) Do NOT drink anything that is not on this list.  In addition, your doctor has ordered for you to drink the provided  Ensure Pre-Surgery Clear Carbohydrate Drink  Drinking this carbohydrate drink up to two hours before surgery helps to reduce insulin resistance and improve patient outcomes. Please complete drinking 2 hours prior to scheduled arrival time.  TAKE THESE MEDICATIONS THE MORNING OF SURGERY WITH A SIP OF WATER:  Albuterol inhaler Levothyroxine Omeprazole - (take one the night before and one on the morning of surgery - helps to prevent nausea after surgery.) Venlafaxine (Effexor)  Use inhalers on the day of surgery and bring to the hospital.  One week prior to surgery: starting January 17 Stop aspirin and Anti-inflammatories (NSAIDS) such as Advil, Aleve, Ibuprofen, Motrin, Naproxen, Naprosyn and Aspirin based products such as Excedrin, Goodys Powder, BC Powder. Stop ANY OVER THE COUNTER supplements until after surgery. Stop vitamin D, elderberry, multiple vitamin, probiotic You may however, continue  to take Tylenol if needed for pain up until the day of surgery.  No Alcohol for 24 hours before or after surgery.  No Smoking including e-cigarettes for 24 hours prior to surgery.   On the morning of surgery brush your teeth with toothpaste and water, you may rinse your mouth with mouthwash if you wish. Do not swallow any toothpaste or mouthwash.  Use CHG Soap as directed on instruction sheet.  Do not wear jewelry, make-up, hairpins, clips or nail polish.  Do not wear lotions, powders, or perfumes.   Do not shave body from the neck down 48 hours prior to surgery just in case you cut yourself which could leave a site for infection.  Also, freshly shaved skin may become irritated if using the CHG soap.  Contact lenses, hearing aids and dentures may not be worn into surgery.  Do not bring valuables to the hospital. [facility] is not responsible for any missing/lost belongings or valuables.   Notify your doctor if there is any change in your medical condition (cold, fever, infection).  Wear comfortable clothing (specific to your surgery type) to the hospital.  After surgery, you can help prevent lung complications by doing breathing exercises.  Take deep breaths and cough every 1-2 hours. Your doctor may order a device called an Incentive Spirometer to help you take deep breaths.  If you are being admitted to the hospital overnight, leave your suitcase in the car. After surgery it may be brought to your room.  If you are being discharged the day of surgery, you will not be allowed to  drive home. You will need a responsible adult (18 years or older) to drive you home and stay with you that night.   If you are taking public transportation, you will need to have a responsible adult (18 years or older) with you. Please confirm with your physician that it is acceptable to use public transportation.   Please call the [place] [facility] at [phone] if you have any  questions about these instructions.  Surgery Visitation Policy:  Patients undergoing a surgery or procedure may have one family member or support person with them as long as that person is not COVID-19 positive or experiencing its symptoms.  That person may remain in the waiting area during the procedure and may rotate out with other people.  Inpatient Visitation:    Visiting hours are 7 a.m. to 8 p.m. Up to two visitors ages 16+ are allowed at one time in a patient room. The visitors may rotate out with other people during the day. Visitors must check out when they leave, or other visitors will not be allowed. One designated support person may remain overnight. The visitor must pass COVID-19 screenings, use hand sanitizer when entering and exiting the patients room and wear a mask at all times, including in the patients room. Patients must also wear a mask when staff or their visitor are in the room. Masking is required regardless of vaccination status.

## 2021-06-28 NOTE — Pre-Procedure Instructions (Signed)
Per patient, tested positive for covid-19 on 05/30/21 (home test). Tested again 06/17/2021 (home test); negative.

## 2021-07-03 ENCOUNTER — Encounter: Payer: Self-pay | Admitting: Internal Medicine

## 2021-07-05 ENCOUNTER — Other Ambulatory Visit
Admission: RE | Admit: 2021-07-05 | Discharge: 2021-07-05 | Disposition: A | Discharge status: Home / Self Care | Payer: Medicare Other | Source: Ambulatory Visit | Attending: Surgery | Admitting: Surgery

## 2021-07-05 ENCOUNTER — Other Ambulatory Visit: Payer: Self-pay

## 2021-07-05 DIAGNOSIS — Z20822 Contact with and (suspected) exposure to covid-19: Secondary | ICD-10-CM | POA: Diagnosis not present

## 2021-07-05 DIAGNOSIS — Z01812 Encounter for preprocedural laboratory examination: Secondary | ICD-10-CM | POA: Diagnosis not present

## 2021-07-05 LAB — SARS CORONAVIRUS 2 (TAT 6-24 HRS): SARS Coronavirus 2: NEGATIVE

## 2021-07-09 ENCOUNTER — Inpatient Hospital Stay: Payer: Medicare Other

## 2021-07-09 ENCOUNTER — Encounter
Admission: RE | Disposition: A | Discharge status: Skilled Nursing Facility | Payer: Self-pay | Source: Home / Self Care | Attending: Surgery

## 2021-07-09 ENCOUNTER — Other Ambulatory Visit: Payer: Self-pay

## 2021-07-09 ENCOUNTER — Ambulatory Visit: Payer: Medicare Other | Admitting: Anesthesiology

## 2021-07-09 ENCOUNTER — Encounter: Payer: Self-pay | Admitting: Surgery

## 2021-07-09 ENCOUNTER — Inpatient Hospital Stay
Admission: RE | Admit: 2021-07-09 | Discharge: 2021-07-12 | DRG: 470 | Disposition: A | Discharge status: Skilled Nursing Facility | Payer: Medicare Other | Attending: Surgery | Admitting: Surgery

## 2021-07-09 DIAGNOSIS — Z96651 Presence of right artificial knee joint: Secondary | ICD-10-CM | POA: Diagnosis not present

## 2021-07-09 DIAGNOSIS — Z8673 Personal history of transient ischemic attack (TIA), and cerebral infarction without residual deficits: Secondary | ICD-10-CM | POA: Diagnosis not present

## 2021-07-09 DIAGNOSIS — Z419 Encounter for procedure for purposes other than remedying health state, unspecified: Secondary | ICD-10-CM

## 2021-07-09 DIAGNOSIS — E669 Obesity, unspecified: Secondary | ICD-10-CM | POA: Diagnosis present

## 2021-07-09 DIAGNOSIS — Z86718 Personal history of other venous thrombosis and embolism: Secondary | ICD-10-CM

## 2021-07-09 DIAGNOSIS — I5022 Chronic systolic (congestive) heart failure: Secondary | ICD-10-CM | POA: Diagnosis not present

## 2021-07-09 DIAGNOSIS — E785 Hyperlipidemia, unspecified: Secondary | ICD-10-CM | POA: Diagnosis present

## 2021-07-09 DIAGNOSIS — R2689 Other abnormalities of gait and mobility: Secondary | ICD-10-CM | POA: Diagnosis not present

## 2021-07-09 DIAGNOSIS — Z96652 Presence of left artificial knee joint: Secondary | ICD-10-CM | POA: Diagnosis present

## 2021-07-09 DIAGNOSIS — R509 Fever, unspecified: Secondary | ICD-10-CM

## 2021-07-09 DIAGNOSIS — Z981 Arthrodesis status: Secondary | ICD-10-CM

## 2021-07-09 DIAGNOSIS — M1711 Unilateral primary osteoarthritis, right knee: Secondary | ICD-10-CM | POA: Diagnosis not present

## 2021-07-09 DIAGNOSIS — Z79890 Hormone replacement therapy (postmenopausal): Secondary | ICD-10-CM | POA: Diagnosis not present

## 2021-07-09 DIAGNOSIS — E039 Hypothyroidism, unspecified: Secondary | ICD-10-CM | POA: Diagnosis present

## 2021-07-09 DIAGNOSIS — Z79899 Other long term (current) drug therapy: Secondary | ICD-10-CM

## 2021-07-09 DIAGNOSIS — Z8616 Personal history of COVID-19: Secondary | ICD-10-CM | POA: Diagnosis not present

## 2021-07-09 DIAGNOSIS — E213 Hyperparathyroidism, unspecified: Secondary | ICD-10-CM | POA: Diagnosis present

## 2021-07-09 DIAGNOSIS — F32A Depression, unspecified: Secondary | ICD-10-CM | POA: Diagnosis not present

## 2021-07-09 DIAGNOSIS — Z7401 Bed confinement status: Secondary | ICD-10-CM | POA: Diagnosis not present

## 2021-07-09 DIAGNOSIS — Z87891 Personal history of nicotine dependence: Secondary | ICD-10-CM

## 2021-07-09 DIAGNOSIS — K219 Gastro-esophageal reflux disease without esophagitis: Secondary | ICD-10-CM | POA: Diagnosis not present

## 2021-07-09 DIAGNOSIS — E0590 Thyrotoxicosis, unspecified without thyrotoxic crisis or storm: Secondary | ICD-10-CM | POA: Diagnosis not present

## 2021-07-09 DIAGNOSIS — Z741 Need for assistance with personal care: Secondary | ICD-10-CM | POA: Diagnosis not present

## 2021-07-09 DIAGNOSIS — R531 Weakness: Secondary | ICD-10-CM | POA: Diagnosis not present

## 2021-07-09 DIAGNOSIS — M6281 Muscle weakness (generalized): Secondary | ICD-10-CM | POA: Diagnosis not present

## 2021-07-09 DIAGNOSIS — K589 Irritable bowel syndrome without diarrhea: Secondary | ICD-10-CM | POA: Diagnosis not present

## 2021-07-09 DIAGNOSIS — Z471 Aftercare following joint replacement surgery: Secondary | ICD-10-CM | POA: Diagnosis not present

## 2021-07-09 DIAGNOSIS — R278 Other lack of coordination: Secondary | ICD-10-CM | POA: Diagnosis not present

## 2021-07-09 DIAGNOSIS — F339 Major depressive disorder, recurrent, unspecified: Secondary | ICD-10-CM | POA: Diagnosis present

## 2021-07-09 DIAGNOSIS — Z6830 Body mass index (BMI) 30.0-30.9, adult: Secondary | ICD-10-CM

## 2021-07-09 DIAGNOSIS — Z7982 Long term (current) use of aspirin: Secondary | ICD-10-CM

## 2021-07-09 DIAGNOSIS — R5381 Other malaise: Secondary | ICD-10-CM | POA: Diagnosis not present

## 2021-07-09 DIAGNOSIS — R4189 Other symptoms and signs involving cognitive functions and awareness: Secondary | ICD-10-CM | POA: Diagnosis not present

## 2021-07-09 HISTORY — PX: TOTAL KNEE ARTHROPLASTY: SHX125

## 2021-07-09 LAB — ABO/RH: ABO/RH(D): O POS

## 2021-07-09 SURGERY — ARTHROPLASTY, KNEE, TOTAL
Anesthesia: Spinal | Site: Knee | Laterality: Right

## 2021-07-09 MED ORDER — CHLORHEXIDINE GLUCONATE 0.12 % MT SOLN
OROMUCOSAL | Status: AC
  Administered 2021-07-09: 07:00:00 15 mL via OROMUCOSAL
  Filled 2021-07-09: qty 15

## 2021-07-09 MED ORDER — KETOROLAC TROMETHAMINE 15 MG/ML IJ SOLN
7.5000 mg | Freq: Four times a day (QID) | INTRAMUSCULAR | Status: AC
  Administered 2021-07-09 – 2021-07-10 (×3): 7.5 mg via INTRAVENOUS
  Filled 2021-07-09 (×3): qty 1

## 2021-07-09 MED ORDER — TRANEXAMIC ACID 1000 MG/10ML IV SOLN
INTRAVENOUS | Status: AC
  Filled 2021-07-09: qty 10

## 2021-07-09 MED ORDER — TIZANIDINE HCL 2 MG PO TABS
2.0000 mg | ORAL_TABLET | Freq: Every evening | ORAL | Status: DC | PRN
  Filled 2021-07-09: qty 2

## 2021-07-09 MED ORDER — VENLAFAXINE HCL ER 75 MG PO CP24
225.0000 mg | ORAL_CAPSULE | Freq: Every day | ORAL | Status: DC
  Administered 2021-07-10 – 2021-07-12 (×3): 225 mg via ORAL
  Filled 2021-07-09 (×4): qty 1

## 2021-07-09 MED ORDER — 0.9 % SODIUM CHLORIDE (POUR BTL) OPTIME
TOPICAL | Status: DC | PRN
  Administered 2021-07-09: 08:00:00 1000 mL

## 2021-07-09 MED ORDER — FENTANYL CITRATE (PF) 100 MCG/2ML IJ SOLN
INTRAMUSCULAR | Status: DC | PRN
  Administered 2021-07-09: 50 ug via INTRAVENOUS

## 2021-07-09 MED ORDER — FENTANYL CITRATE (PF) 100 MCG/2ML IJ SOLN: 25.0000 ug | INTRAMUSCULAR | Status: DC | PRN

## 2021-07-09 MED ORDER — PROPOFOL 10 MG/ML IV BOLUS
INTRAVENOUS | Status: DC | PRN
Start: 2021-07-09 — End: 2021-07-09
  Administered 2021-07-09: 30 mg via INTRAVENOUS

## 2021-07-09 MED ORDER — MIRTAZAPINE 15 MG PO TABS
15.0000 mg | ORAL_TABLET | Freq: Every day | ORAL | Status: DC
  Administered 2021-07-09 – 2021-07-11 (×3): 15 mg via ORAL
  Filled 2021-07-09 (×3): qty 1

## 2021-07-09 MED ORDER — OXYCODONE HCL 5 MG PO TABS
5.0000 mg | ORAL_TABLET | ORAL | Status: DC | PRN
  Administered 2021-07-09: 21:00:00 5 mg via ORAL
  Administered 2021-07-09: 14:00:00 10 mg via ORAL
  Administered 2021-07-10: 5 mg via ORAL
  Administered 2021-07-10 – 2021-07-12 (×3): 10 mg via ORAL
  Filled 2021-07-09 (×2): qty 1
  Filled 2021-07-09 (×4): qty 2

## 2021-07-09 MED ORDER — METOCLOPRAMIDE HCL 5 MG/ML IJ SOLN: 5.0000 mg | Freq: Three times a day (TID) | INTRAMUSCULAR | Status: DC | PRN

## 2021-07-09 MED ORDER — SODIUM CHLORIDE (PF) 0.9 % IJ SOLN
INTRAMUSCULAR | Status: DC | PRN
  Administered 2021-07-09: 90 mL via INTRAMUSCULAR

## 2021-07-09 MED ORDER — TRAMADOL HCL 50 MG PO TABS
50.0000 mg | ORAL_TABLET | Freq: Four times a day (QID) | ORAL | Status: DC | PRN
  Administered 2021-07-10 – 2021-07-11 (×2): 50 mg via ORAL
  Filled 2021-07-09 (×2): qty 1

## 2021-07-09 MED ORDER — KETOROLAC TROMETHAMINE 15 MG/ML IJ SOLN
15.0000 mg | Freq: Once | INTRAMUSCULAR | Status: AC
  Administered 2021-07-09: 10:00:00 15 mg via INTRAVENOUS

## 2021-07-09 MED ORDER — MAGNESIUM HYDROXIDE 400 MG/5ML PO SUSP: 30.0000 mL | Freq: Every day | ORAL | Status: DC | PRN

## 2021-07-09 MED ORDER — KETOROLAC TROMETHAMINE 15 MG/ML IJ SOLN
INTRAMUSCULAR | Status: AC
  Administered 2021-07-09: 14:00:00 7.5 mg
  Filled 2021-07-09: qty 1

## 2021-07-09 MED ORDER — BISACODYL 10 MG RE SUPP: 10.0000 mg | Freq: Every day | RECTAL | Status: DC | PRN

## 2021-07-09 MED ORDER — BUPIVACAINE-EPINEPHRINE (PF) 0.5% -1:200000 IJ SOLN
INTRAMUSCULAR | Status: AC
  Filled 2021-07-09: qty 30

## 2021-07-09 MED ORDER — CLONAZEPAM 1 MG PO TABS
1.0000 mg | ORAL_TABLET | Freq: Every day | ORAL | Status: DC
  Administered 2021-07-09 – 2021-07-11 (×3): 1 mg via ORAL
  Filled 2021-07-09 (×3): qty 1

## 2021-07-09 MED ORDER — VITAMIN D 25 MCG (1000 UNIT) PO TABS
5000.0000 [IU] | ORAL_TABLET | Freq: Every day | ORAL | Status: DC
  Administered 2021-07-09 – 2021-07-12 (×4): 5000 [IU] via ORAL
  Filled 2021-07-09 (×4): qty 5

## 2021-07-09 MED ORDER — FLEET ENEMA 7-19 GM/118ML RE ENEM: 1.0000 | ENEMA | Freq: Once | RECTAL | Status: DC | PRN

## 2021-07-09 MED ORDER — ELDERBERRY 500 MG PO CAPS: ORAL_CAPSULE | Freq: Every day | ORAL | Status: DC

## 2021-07-09 MED ORDER — ORAL CARE MOUTH RINSE: 15.0000 mL | Freq: Once | OROMUCOSAL | Status: AC

## 2021-07-09 MED ORDER — ACETAMINOPHEN 500 MG PO TABS
1000.0000 mg | ORAL_TABLET | Freq: Four times a day (QID) | ORAL | Status: AC
  Administered 2021-07-09 – 2021-07-10 (×4): 1000 mg via ORAL
  Filled 2021-07-09 (×4): qty 2

## 2021-07-09 MED ORDER — ALBUTEROL SULFATE (2.5 MG/3ML) 0.083% IN NEBU: 2.5000 mg | INHALATION_SOLUTION | Freq: Four times a day (QID) | RESPIRATORY_TRACT | Status: DC | PRN

## 2021-07-09 MED ORDER — FERROUS SULFATE 325 (65 FE) MG PO TABS
325.0000 mg | ORAL_TABLET | Freq: Every day | ORAL | Status: DC
  Administered 2021-07-09 – 2021-07-12 (×4): 325 mg via ORAL
  Filled 2021-07-09 (×4): qty 1

## 2021-07-09 MED ORDER — ACETAMINOPHEN 325 MG PO TABS: 325.0000 mg | ORAL_TABLET | Freq: Four times a day (QID) | ORAL | Status: DC | PRN

## 2021-07-09 MED ORDER — FENTANYL CITRATE (PF) 100 MCG/2ML IJ SOLN
INTRAMUSCULAR | Status: AC
  Filled 2021-07-09: qty 2

## 2021-07-09 MED ORDER — DOCUSATE SODIUM 100 MG PO CAPS
100.0000 mg | ORAL_CAPSULE | Freq: Two times a day (BID) | ORAL | Status: DC
  Administered 2021-07-09 – 2021-07-12 (×7): 100 mg via ORAL
  Filled 2021-07-09 (×7): qty 1

## 2021-07-09 MED ORDER — PHENYLEPHRINE HCL (PRESSORS) 10 MG/ML IV SOLN
INTRAVENOUS | Status: AC
  Filled 2021-07-09: qty 1

## 2021-07-09 MED ORDER — CEFAZOLIN SODIUM-DEXTROSE 2-4 GM/100ML-% IV SOLN
INTRAVENOUS | Status: AC
  Filled 2021-07-09: qty 100

## 2021-07-09 MED ORDER — BUPIVACAINE LIPOSOME 1.3 % IJ SUSP
INTRAMUSCULAR | Status: AC
  Filled 2021-07-09: qty 20

## 2021-07-09 MED ORDER — CEFAZOLIN SODIUM-DEXTROSE 2-4 GM/100ML-% IV SOLN
2.0000 g | Freq: Four times a day (QID) | INTRAVENOUS | Status: AC
  Administered 2021-07-09 – 2021-07-10 (×3): 2 g via INTRAVENOUS
  Filled 2021-07-09 (×3): qty 100

## 2021-07-09 MED ORDER — TRANEXAMIC ACID 1000 MG/10ML IV SOLN
INTRAVENOUS | Status: DC | PRN
  Administered 2021-07-09: 10:00:00 1000 mg via TOPICAL

## 2021-07-09 MED ORDER — PROPOFOL 500 MG/50ML IV EMUL
INTRAVENOUS | Status: DC | PRN
  Administered 2021-07-09: 80 ug/kg/min via INTRAVENOUS

## 2021-07-09 MED ORDER — CEFAZOLIN SODIUM-DEXTROSE 2-4 GM/100ML-% IV SOLN
2.0000 g | INTRAVENOUS | Status: AC
  Administered 2021-07-09: 08:00:00 2 g via INTRAVENOUS

## 2021-07-09 MED ORDER — VENLAFAXINE HCL ER 75 MG PO CP24: 75.0000 mg | ORAL_CAPSULE | Freq: Every day | ORAL | Status: DC

## 2021-07-09 MED ORDER — DIPHENHYDRAMINE HCL 12.5 MG/5ML PO ELIX: 12.5000 mg | ORAL_SOLUTION | ORAL | Status: DC | PRN

## 2021-07-09 MED ORDER — SODIUM CHLORIDE 0.9 % IR SOLN
Status: DC | PRN
  Administered 2021-07-09: 3000 mL

## 2021-07-09 MED ORDER — CHLORHEXIDINE GLUCONATE 0.12 % MT SOLN: 15.0000 mL | Freq: Once | OROMUCOSAL | Status: AC

## 2021-07-09 MED ORDER — LEVOTHYROXINE SODIUM 50 MCG PO TABS
75.0000 ug | ORAL_TABLET | Freq: Every day | ORAL | Status: DC
  Administered 2021-07-10 – 2021-07-12 (×3): 75 ug via ORAL
  Filled 2021-07-09 (×3): qty 1

## 2021-07-09 MED ORDER — PROPOFOL 1000 MG/100ML IV EMUL
INTRAVENOUS | Status: AC
  Filled 2021-07-09: qty 100

## 2021-07-09 MED ORDER — ADULT MULTIVITAMIN W/MINERALS CH
2.0000 | ORAL_TABLET | Freq: Every day | ORAL | Status: DC
  Administered 2021-07-09 – 2021-07-12 (×4): 2 via ORAL
  Filled 2021-07-09 (×4): qty 2

## 2021-07-09 MED ORDER — VENLAFAXINE HCL ER 150 MG PO CP24: 150.0000 mg | ORAL_CAPSULE | Freq: Every day | ORAL | Status: DC

## 2021-07-09 MED ORDER — ATORVASTATIN CALCIUM 20 MG PO TABS
40.0000 mg | ORAL_TABLET | Freq: Every evening | ORAL | Status: DC
  Administered 2021-07-09 – 2021-07-12 (×4): 40 mg via ORAL
  Filled 2021-07-09 (×4): qty 2

## 2021-07-09 MED ORDER — APIXABAN 2.5 MG PO TABS
2.5000 mg | ORAL_TABLET | Freq: Two times a day (BID) | ORAL | Status: DC
  Administered 2021-07-10 – 2021-07-12 (×5): 2.5 mg via ORAL
  Filled 2021-07-09 (×5): qty 1

## 2021-07-09 MED ORDER — LACTATED RINGERS IV SOLN: INTRAVENOUS | Status: DC

## 2021-07-09 MED ORDER — ONDANSETRON HCL 4 MG PO TABS: 4.0000 mg | ORAL_TABLET | Freq: Four times a day (QID) | ORAL | Status: DC | PRN

## 2021-07-09 MED ORDER — PANTOPRAZOLE SODIUM 40 MG PO TBEC
40.0000 mg | DELAYED_RELEASE_TABLET | Freq: Every day | ORAL | Status: DC
  Administered 2021-07-10 – 2021-07-12 (×3): 40 mg via ORAL
  Filled 2021-07-09 (×3): qty 1

## 2021-07-09 MED ORDER — ALBUTEROL SULFATE HFA 108 (90 BASE) MCG/ACT IN AERS: 2.0000 | INHALATION_SPRAY | Freq: Four times a day (QID) | RESPIRATORY_TRACT | Status: DC | PRN

## 2021-07-09 MED ORDER — METOCLOPRAMIDE HCL 10 MG PO TABS: 5.0000 mg | ORAL_TABLET | Freq: Three times a day (TID) | ORAL | Status: DC | PRN

## 2021-07-09 MED ORDER — HYDROMORPHONE HCL 1 MG/ML IJ SOLN
0.2500 mg | INTRAMUSCULAR | Status: DC | PRN
  Administered 2021-07-10: 01:00:00 0.25 mg via INTRAVENOUS
  Administered 2021-07-10 – 2021-07-11 (×3): 0.5 mg via INTRAVENOUS
  Administered 2021-07-12: 0.25 mg via INTRAVENOUS
  Filled 2021-07-09 (×5): qty 1

## 2021-07-09 MED ORDER — SODIUM CHLORIDE 0.9 % IV SOLN: INTRAVENOUS | Status: DC

## 2021-07-09 MED ORDER — BUPIVACAINE HCL (PF) 0.5 % IJ SOLN
INTRAMUSCULAR | Status: DC | PRN
Start: 2021-07-09 — End: 2021-07-09
  Administered 2021-07-09: 3 mL

## 2021-07-09 MED ORDER — RISAQUAD PO CAPS
1.0000 | ORAL_CAPSULE | Freq: Every day | ORAL | Status: DC
  Administered 2021-07-09 – 2021-07-11 (×3): 1 via ORAL
  Filled 2021-07-09 (×3): qty 1

## 2021-07-09 MED ORDER — OXYCODONE HCL 5 MG PO TABS: 5.0000 mg | ORAL_TABLET | Freq: Once | ORAL | Status: DC | PRN

## 2021-07-09 MED ORDER — ONDANSETRON HCL 4 MG/2ML IJ SOLN: 4.0000 mg | Freq: Four times a day (QID) | INTRAMUSCULAR | Status: DC | PRN

## 2021-07-09 MED ORDER — OXYCODONE HCL 5 MG/5ML PO SOLN: 5.0000 mg | Freq: Once | ORAL | Status: DC | PRN

## 2021-07-09 MED ORDER — SODIUM CHLORIDE FLUSH 0.9 % IV SOLN
INTRAVENOUS | Status: AC
  Filled 2021-07-09: qty 40

## 2021-07-09 SURGICAL SUPPLY — 58 items
BIT DRILL QUICK REL 1/8 2PK SL (DRILL) IMPLANT
BLADE SAW SAG 25X90X1.19 (BLADE) ×2 IMPLANT
BLADE SURG SZ20 CARB STEEL (BLADE) ×2 IMPLANT
BNDG ELASTIC 6X5.8 VLCR NS LF (GAUZE/BANDAGES/DRESSINGS) ×2 IMPLANT
CEMENT BONE R 1X40 (Cement) ×4 IMPLANT
CEMENT VACUUM MIXING SYSTEM (MISCELLANEOUS) ×2 IMPLANT
CHLORAPREP W/TINT 26 (MISCELLANEOUS) ×3 IMPLANT
COMP PAT 3 PEG SERIES A 31/6.2 (Miscellaneous) ×2 IMPLANT
COMPONENT PAT3 PEG SERS 31/6.2 (Miscellaneous) IMPLANT
COOLER POLAR GLACIER W/PUMP (MISCELLANEOUS) ×2 IMPLANT
COVER MAYO STAND REUSABLE (DRAPES) ×2 IMPLANT
DRAPE 3/4 80X56 (DRAPES) ×2 IMPLANT
DRAPE IMP U-DRAPE 54X76 (DRAPES) ×2 IMPLANT
DRAPE INCISE 23X17 IOBAN STRL (DRAPES) ×1
DRAPE INCISE 23X17 STRL (DRAPES) ×1 IMPLANT
DRAPE INCISE IOBAN 23X17 STRL (DRAPES) ×1 IMPLANT
DRAPE U-SHAPE 47X51 STRL (DRAPES) ×2 IMPLANT
DRILL QUICK RELEASE 1/8 INCH (DRILL) ×3
DRSG MEPILEX SACRM 8.7X9.8 (GAUZE/BANDAGES/DRESSINGS) ×1 IMPLANT
DRSG OPSITE POSTOP 4X10 (GAUZE/BANDAGES/DRESSINGS) ×1 IMPLANT
DRSG OPSITE POSTOP 4X8 (GAUZE/BANDAGES/DRESSINGS) ×2 IMPLANT
ELECT REM PT RETURN 9FT ADLT (ELECTROSURGICAL) ×2
ELECTRODE REM PT RTRN 9FT ADLT (ELECTROSURGICAL) ×1 IMPLANT
FEMORAL CR RIGHT 67.5MM (Joint) ×1 IMPLANT
GAUZE XEROFORM 1X8 LF (GAUZE/BANDAGES/DRESSINGS) ×2 IMPLANT
GLOVE SRG 8 PF TXTR STRL LF DI (GLOVE) ×1 IMPLANT
GLOVE SURG ENC MOIS LTX SZ7.5 (GLOVE) ×8 IMPLANT
GLOVE SURG ENC MOIS LTX SZ8 (GLOVE) ×8 IMPLANT
GLOVE SURG SYN 9.0  PF PI (GLOVE) ×2
GLOVE SURG SYN 9.0 PF PI (GLOVE) IMPLANT
GLOVE SURG UNDER LTX SZ8 (GLOVE) ×2 IMPLANT
GLOVE SURG UNDER POLY LF SZ8 (GLOVE) ×1
GOWN STRL REUS W/ TWL LRG LVL3 (GOWN DISPOSABLE) ×1 IMPLANT
GOWN STRL REUS W/ TWL XL LVL3 (GOWN DISPOSABLE) ×1 IMPLANT
GOWN STRL REUS W/TWL LRG LVL3 (GOWN DISPOSABLE) ×1
GOWN STRL REUS W/TWL XL LVL3 (GOWN DISPOSABLE) ×1
INSERT TIB BEARING 71X12 (Insert) ×1 IMPLANT
IV NS IRRIG 3000ML ARTHROMATIC (IV SOLUTION) ×2 IMPLANT
KIT TURNOVER KIT A (KITS) ×2 IMPLANT
MANIFOLD NEPTUNE II (INSTRUMENTS) ×2 IMPLANT
NDL SPNL 20GX3.5 QUINCKE YW (NEEDLE) ×1 IMPLANT
NEEDLE SPNL 20GX3.5 QUINCKE YW (NEEDLE) ×2 IMPLANT
NS IRRIG 1000ML POUR BTL (IV SOLUTION) ×2 IMPLANT
PACK TOTAL KNEE (MISCELLANEOUS) ×2 IMPLANT
PAD WRAPON POLAR KNEE (MISCELLANEOUS) ×1 IMPLANT
PLATE KNEE TIBIAL 71MM FIXED (Plate) ×1 IMPLANT
PULSAVAC PLUS IRRIG FAN TIP (DISPOSABLE) ×2
SPONGE T-LAP 18X18 ~~LOC~~+RFID (SPONGE) ×3 IMPLANT
STAPLER SKIN PROX 35W (STAPLE) ×2 IMPLANT
SUCTION FRAZIER HANDLE 10FR (MISCELLANEOUS) ×1
SUCTION TUBE FRAZIER 10FR DISP (MISCELLANEOUS) ×1 IMPLANT
SUT VIC AB 0 CT1 36 (SUTURE) ×6 IMPLANT
SUT VIC AB 2-0 CT1 27 (SUTURE) ×4
SUT VIC AB 2-0 CT1 TAPERPNT 27 (SUTURE) ×3 IMPLANT
SYR 30ML LL (SYRINGE) ×3 IMPLANT
TIP FAN IRRIG PULSAVAC PLUS (DISPOSABLE) ×1 IMPLANT
TRAP FLUID SMOKE EVACUATOR (MISCELLANEOUS) ×2 IMPLANT
WRAPON POLAR PAD KNEE (MISCELLANEOUS) ×2

## 2021-07-09 NOTE — Evaluation (Signed)
Physical Therapy Evaluation Patient Details Name: Wendy Murray MRN: [id number] DOB: 03/17/1936 Today's Date: 07/09/2021  History of Present Illness  Pt admitted for R TKR. HIstory of asthma, CHF, GERD, HLD, and IBS. Pt is POD 0 at time of evaluation.  Clinical Impression  Pt is a pleasant 86 year old female who was admitted for R TKR. Pt performs bed mobility, transfers, and ambulation with mod assist and RW. Pt demonstrates ability to perform 10 SLRs with independence, therefore does not require KI for mobility. Pt demonstrates deficits with strength/mobility/ROM. Would benefit from skilled PT to address above deficits and promote optimal return to PLOF; recommend transition to STR upon discharge from acute hospitalization.      Recommendations for follow up therapy are one component of a multi-disciplinary discharge planning process, led by the attending physician.  Recommendations may be updated based on patient status, additional functional criteria and insurance authorization.  Follow Up Recommendations Skilled nursing-short term rehab (<3 hours/day)    Assistance Recommended at Discharge Intermittent Supervision/Assistance  Patient can return home with the following  A lot of help with walking and/or transfers    Equipment Recommendations None recommended by PT  Recommendations for Other Services       Functional Status Assessment Patient has had a recent decline in their functional status and demonstrates the ability to make significant improvements in function in a reasonable and predictable amount of time.     Precautions / Restrictions Precautions Precautions: Fall;Knee Precaution Booklet Issued: No Restrictions Weight Bearing Restrictions: Yes RLE Weight Bearing: Weight bearing as tolerated      Mobility  Bed Mobility Overal bed mobility: Needs Assistance Bed Mobility: Supine to Sit     Supine to sit: Mod assist     General bed mobility comments: needs  assist for sequencing and moving surgical leg    Transfers Overall transfer level: Needs assistance Equipment used: Rolling walker (2 wheels) Transfers: Sit to/from Stand Sit to Stand: Mod assist           General transfer comment: safe technique with RW. Anxious with mobility. Guarding towards non surgical side    Ambulation/Gait Ambulation/Gait assistance: Mod assist Gait Distance (Feet): 3 Feet Assistive device: Rolling walker (2 wheels) Gait Pattern/deviations: Step-to pattern       General Gait Details: ambulated to recliner using RW  Stairs            Wheelchair Mobility    Modified Rankin (Stroke Patients Only)       Balance Overall balance assessment: Needs assistance Sitting-balance support: Feet supported Sitting balance-Leahy Scale: Good     Standing balance support: Bilateral upper extremity supported Standing balance-Leahy Scale: Good                               Pertinent Vitals/Pain Pain Assessment Pain Assessment: 0-10 Pain Score: 4  Pain Location: R knee Pain Descriptors / Indicators: Operative site guarding Pain Intervention(s): Limited activity within patient's tolerance, Repositioned, Ice applied    Home Living Family/patient expects to be discharged to:: Private residence Living Arrangements:  [facility] ILF) Available Help at Discharge: Family Type of Home: Independent living facility Home Access: Level entry       Home Layout: One level Home Equipment: Grab bars - toilet;Grab bars - tub/shower;Rolling Walker (2 wheels);Rollator (4 wheels);Cane - single point;Toilet riser Additional Comments: husband just recently passed away 2 weeks ago    Prior Function Prior Level  of Function : Independent/Modified Independent             Mobility Comments: no falls, wasn't using AD       Hand Dominance        Extremity/Trunk Assessment   Upper Extremity Assessment Upper Extremity Assessment: Overall WFL  for tasks assessed    Lower Extremity Assessment Lower Extremity Assessment: Generalized weakness (R LE grossly 3/5)       Communication   Communication: No difficulties  Cognition Arousal/Alertness: Awake/alert Behavior During Therapy: WFL for tasks assessed/performed Overall Cognitive Status: Within Functional Limits for tasks assessed                                          General Comments      Exercises Total Joint Exercises Goniometric ROM: R knee 15-65 degrees Other Exercises Other Exercises: supine ther-ex performed on R LE including AP, quad sets, SLR, and hip abd/add 10 reps with min/mod assist   Assessment/Plan    PT Assessment Patient needs continued PT services  PT Problem List Decreased strength;Decreased range of motion;Decreased balance;Decreased mobility;Pain       PT Treatment Interventions DME instruction;Gait training;Stair training;Therapeutic exercise    PT Goals (Current goals can be found in the Care Plan section)  Acute Rehab PT Goals Patient Stated Goal: to go to SNF PT Goal Formulation: With patient Time For Goal Achievement: 07/23/21 Potential to Achieve Goals: Good    Frequency BID     Co-evaluation               AM-PAC PT "6 Clicks" Mobility  Outcome Measure Help needed turning from your back to your side while in a flat bed without using bedrails?: A Lot Help needed moving from lying on your back to sitting on the side of a flat bed without using bedrails?: A Lot Help needed moving to and from a bed to a chair (including a wheelchair)?: A Lot Help needed standing up from a chair using your arms (e.g., wheelchair or bedside chair)?: A Lot Help needed to walk in hospital room?: Total Help needed climbing 3-5 steps with a railing? : Total 6 Click Score: 10    End of Session Equipment Utilized During Treatment: Gait belt Activity Tolerance: Patient tolerated treatment well Patient left: in chair;with chair  alarm set Nurse Communication: Mobility status PT Visit Diagnosis: Muscle weakness (generalized) (M62.81);Difficulty in walking, not elsewhere classified (R26.2);Pain Pain - Right/Left: Right Pain - part of body: Knee    Time: 6812-7517 PT Time Calculation (min) (ACUTE ONLY): 39 min   Charges:   PT Evaluation $PT Eval Low Complexity: 1 Low PT Treatments $Gait Training: 8-22 mins $Therapeutic Exercise: 8-22 mins        Greggory Stallion, PT, DPT, GCS [phone]   Kera Deacon 07/09/2021, 5:46 PM

## 2021-07-09 NOTE — Op Note (Signed)
07/09/2021  9:56 AM  Patient:   Wendy Murray  Pre-Op Diagnosis:   Degenerative joint disease, right knee.  Post-Op Diagnosis:   Same  Procedure:   Right TKA using all-cemented [employer] Vanguard system with a 67.5 mm mm PCR femur, a 71 mm tibial tray with a 12 mm anterior stabilized E-poly insert, and a 31 x 6.2 mm all-poly 3-pegged domed patella.  Surgeon:   Pascal Lux, MD  Assistant:   Cameron Proud, PA-C; Rocco Pauls, PA-S  Anesthesia:   Spinal  Findings:   As above  Complications:   None  EBL:   20 cc  Fluids:   800 cc crystalloid  UOP:   None  TT:   90 minutes at 300 mmHg  Drains:   None  Closure:   Staples  Implants:   As above  Brief Clinical Note:   The patient is an 86 year old female with a long history of progressively worsening right knee pain. [name] symptoms have progressed despite medications, activity modification, injections, etc. [name] history and examination were consistent with advanced degenerative joint disease of the right knee confirmed by plain radiographs. The patient presents at this time for a right total knee arthroplasty.  Procedure:   The patient was brought into the operating room. After adequate spinal anesthesia was obtained, the patient was lain in the supine position before the right lower extremity was prepped with ChloraPrep solution and draped sterilely. Preoperative antibiotics were administered. After verifying the proper laterality with a surgical timeout, the limb was exsanguinated with an Esmarch and the tourniquet inflated to 300 mmHg.   A standard anterior approach to the knee was made through an approximately 7 inch incision. The incision was carried down through the subcutaneous tissues to expose superficial retinaculum. This was split the length of the incision and the medial flap elevated sufficiently to expose the medial retinaculum. The medial retinaculum was incised, leaving a 3-4 mm cuff of tissue on  the patella. This was extended distally along the medial border of the patellar tendon and proximally through the medial third of the quadriceps tendon. A subtotal fat pad excision was performed before the soft tissues were elevated off the anteromedial and anterolateral aspects of the proximal tibia to the level of the collateral ligaments. The anterior portions of the medial and lateral menisci were removed, as was the anterior cruciate ligament. With the knee flexed to 90, the external tibial guide was positioned and the appropriate proximal tibial cut made. This piece was taken to the back table where it was measured and found to be optimally replicated by a 71 mm component.  Attention was directed to the distal femur. The intramedullary canal was accessed through a 3/8" drill hole. The intramedullary guide was inserted and positioned in order to obtain a neutral flexion gap. The intercondylar block was positioned with care taken to avoid notching the anterior cortex of the femur. The appropriate cut was made. Next, the distal cutting block was placed at 6 of valgus alignment. Using the 9 mm slot, the distal cut was made. The distal femur was measured and found to be optimally replicated by the 16.1 mm component. The 67.5 mm 4-in-1 cutting block was positioned and first the posterior, then the posterior chamfer, the anterior chamfer, and finally the anterior cuts were made. At this point, the posterior portions medial and lateral menisci were removed. A trial reduction was performed using the appropriate femoral and tibial components with first the 10 mm  and then the 12 mm insert. The 12 mm insert demonstrated excellent stability to varus and valgus stressing both in flexion and extension while permitting full extension.   Patella tracking was assessed and found to be excellent. Therefore, the tibial guide position was marked on the proximal tibia. The patella thickness was measured and found to be 20 mm.  Therefore, the appropriate cut was made. The patellar surface was measured and found to be optimally replicated by the 31 mm component. The three peg holes were drilled in place before the trial button was inserted. Patella tracking was assessed and found to be excellent, passing the "no thumb test". The lug holes were drilled into the distal femur before the trial component was removed, leaving only the tibial tray. The keel was then created using the appropriate tower, reamer, and punch.  The bony surfaces were prepared for cementing by irrigating them thoroughly with sterile saline solution via the jet lavage system. A bone plug was fashioned from some of the bone that had been removed previously and used to plug the distal femoral canal. In addition, 20 cc of Exparel diluted out to 60 cc with normal saline and 30 cc of 0.5% Sensorcaine were injected into the postero-medial and postero-lateral aspects of the knee, the medial and lateral gutter regions, and the peri-incisional tissues to help with postoperative analgesia. Meanwhile, the cement was being mixed on the back table. When it was ready, the tibial tray was cemented in first. The excess cement was removed using [employer] fast streamer. Next, the femoral component was impacted into place. Again, the excess cement was removed using [employer] fast streamer. The 12 mm trial insert was positioned and the knee brought into extension while the cement hardened. Finally, the patella was cemented into place and secured using the patellar clamp. Again, the excess cement was removed using [employer] fast streamer. Once the cement had hardened, the knee was placed through a range of motion with the findings as described above. Therefore, the trial insert was removed and, after verifying that no cement had been retained posteriorly, the permanent 12 mm anterior stabilized E-polyethylene insert was positioned and secured using the appropriate key locking mechanism. Again the knee was placed  through a range of motion with the findings as described above.  The wound was copiously irrigated with sterile saline solution using the jet lavage system before the quadriceps tendon and retinacular layer were reapproximated using #0 Vicryl interrupted sutures. The superficial retinacular layer also was closed using a running #0 Vicryl suture. A total of 10 cc of transexemic acid (TXA) was injected intra-articularly before the subcutaneous tissues were closed in several layers using 2-0 Vicryl interrupted sutures. The skin was closed using staples. A sterile honeycomb dressing was applied to the skin before the leg was wrapped with an Ace wrap to accommodate the Polar Care device. The patient was then awakened and returned to the recovery room in satisfactory condition after tolerating the procedure well.

## 2021-07-09 NOTE — H&P (Signed)
History of Present Illness: Wendy Murray is a 86 y.o. female who presents today for her history and physical for upcoming right total knee arthroplasty. Surgery scheduled with Dr. Roland Rack on 07/09/2021. The patient continues report moderate aching discomfort in the right knee. She reports a pain score 3 out of 10 at today's visit. The patient taking Tylenol as needed for discomfort. She denies any recent trauma or injury affecting the right knee. She denies any numbness or ting into the right lower extremity. She does report moderate aching discomfort involving the medial lateral aspect of the right knee. The patient denies any personal history of heart attack, she does have history of TIA. She does take a daily baby aspirin. She does report a history of developing a acute DVT after undergoing her left total knee arthroplasty in the past. She does have a albuterol inhaler that she uses as needed but does not report any history of COPD. She is currently not using any assistive devices for ambulation at this time.  Past Medical History:  Depression   Diverticulosis   DVT (deep venous thrombosis) (CMS-HCC)   History of calcium pyrophosphate deposition disease (CPPD)   Hyperlipidemia   Hyperparathyroidism (CMS-HCC)   Hypothyroidism   IBS (irritable bowel syndrome)   Inflammatory arthritis   Thyroid disease   TIA (transient ischemic attack)   Past Surgical History:  EXPLORATION SPINAL FUSION 1990 (Dr. Eda Paschal, [place] VITRECTOMY, BILATERAL   CHOLECYSTECTOMY   hammer toe surgery   HYSTERECTOMY   Left total knee arthroplasty (Dr. Marcelle Overlie, [place])   meniscus repair   Past Family History:  Cancer Mother   Depression Mother   Cancer Father   Depression Sister   Medications: Current Outpatient Medications Ordered in Epic  Medication Sig Dispense Refill   acetaminophen (TYLENOL) 325 MG tablet  Take 325 mg by mouth every 8 (eight) hours as needed   albuterol 90 mcg/actuation inhaler Inhale into the lungs   aspirin 81 MG EC tablet Take 81 mg by mouth once daily   atorvastatin (LIPITOR) 40 MG tablet Take 40 mg by mouth at bedtime   Bifidobacterium infantis (ALIGN) 4 mg capsule Take 1 capsule by mouth at bedtime   celecoxib (CELEBREX) 200 MG capsule Take by mouth   cholecalciferol (VITAMIN D3) 5,000 unit capsule Take by mouth   clonazePAM (KLONOPIN) 1 MG tablet Take 1 mg by mouth at bedtime   ferrous sulfate 142 mg (45 mg iron) TbER Take by mouth   levothyroxine (SYNTHROID, LEVOTHROID) 75 MCG tablet Take 75 mcg by mouth at bedtime   mirtazapine (REMERON) 15 MG tablet Take 15 mg by mouth at bedtime   multivitamin tablet Take 1 tablet by mouth once daily   omeprazole (PRILOSEC) 20 MG DR capsule Take 20 mg by mouth at bedtime   tiZANidine (ZANAFLEX) 4 MG tablet TAKE 1/2 TABLET BY MOUTH THREE TIMES A DAY AS NEEDED FOR MUSCLE SPASMS 30 tablet 1   venlafaxine (EFFEXOR-XR) 150 MG XR capsule Take 150 mg by mouth at bedtime   venlafaxine (EFFEXOR-XR) 75 MG XR capsule Take 75 mg by mouth at bedtime   clonazePAM (KLONOPIN) 2 MG tablet Take 2 mg by mouth. (Patient not taking: Reported on 03/18/2021)   predniSONE (DELTASONE) 10 MG tablet 5,5,4,4,3,3,2,2,1,1 tab po tapering dose (Patient not taking: Reported on 07/01/2021) 30 tablet 0   Allergies: No Known Allergies   Review of Systems:  A comprehensive  14 point ROS was performed, reviewed by me today, and the pertinent orthopaedic findings are documented in the HPI.  Physical Exam: BP (!) 138/92 (BP Location: Left upper arm, Patient Position: Sitting, BP Cuff Size: Adult)   Ht 165.1 cm (5\' 5" )   Wt 83.3 kg (183 lb 9.6 oz)   BMI 30.55 kg/m  General/Constitutional: The patient appears to be well-nourished, well-developed, and in no acute distress. Neuro/Psych: Normal mood and affect, oriented to person, place and time. Eyes: Non-icteric. Pupils  are equal, round, and reactive to light, and exhibit synchronous movement. ENT: Unremarkable. Lymphatic: No palpable adenopathy. Respiratory: Lungs clear to auscultation, Normal chest excursion, No wheezes and Non-labored breathing Cardiovascular: Regular rate and rhythm. No murmurs. and No edema, swelling or tenderness, except as noted in detailed exam. Integumentary: No impressive skin lesions present, except as noted in detailed exam. Musculoskeletal: Unremarkable, except as noted in detailed exam.  General: Well developed, well nourished 86 y.o. female in no apparent distress. Normal affect. Normal communication. Patient answers questions appropriately. The patient is ambulatory at today's visit with a mild limp favoring the right leg   Right lower Extremities: Examination of the right lower extremity reveals no bony abnormality, no edema, mild effusion and no ecchymosis. There is no valgus or varus abnormality. The patient is tender along the lateral joint line, and is tender along the medial joint line. The patient has 110 degrees knee flexion and -5 degrees extension. There is flexion and full extension discomfort with range of motion exercises. The patient has a negative rotational Mcmurray test. There is no retropatellar discomfort. The patient has a negative patella stretch test. The patient has a negative varus stress test and a negative valgus stress test, in looking for stability. The patient has a negative Lachman's test.   Vascular: The patient has a negative Bevelyn Buckles' test bilaterally. The patient had a normal dorsalis pedis and posterior tibial pulse. There is normal skin warmth. There is normal capillary refill bilaterally.    Neurologic: The patient has a negative straight leg raise. The patient has normal muscle strength testing for the quadriceps, calves, ankle dorsiflexion, ankle plantarflexion, and extensor hallicus longus. The patient has sensation that is intact to light  touch. The deep tendon reflexes are normal at the patella and achilles. No clonus is noted.   Imaging: Previous x-rays of the right knee have been obtained in the office and reviewed today. These x-rays demonstrate moderate osteoarthritic changes throughout the right knee with calcification of the medial and lateral meniscus. Osteophyte formation off of the medial and lateral femoral condyles. No acute fracture can be identified.  Impression: 1. Primary osteoarthritis of right knee. 2. Obesity (BMI 30.0-34.9).  Plan:  1. Treatment options were discussed today with the patient. 2. The patient scheduled for a right total knee arthroplasty with Dr. Roland Rack on 07/09/2021. She was instructed to stop her daily aspirin 3 days prior to surgery. 3. The patient was instructed on the risk and benefits of surgery and wished to proceed at this time. 4. This document will serve as a surgical history and physical for the patient. 5. The patient will follow-up per standard postop. They can call the clinic they have any questions, new symptoms develop or symptoms worsen.  The procedure was discussed with the patient, as were the potential risks (including bleeding, infection, nerve and/or blood vessel injury, persistent or recurrent pain, failure of the hardware, stiffness, amputation, need for further surgery, blood clots, strokes, heart attacks and/or arhythmias, pneumonia,  etc.) and benefits. The patient states her understanding and wishes to proceed.   H&P reviewed and patient re-examined. No changes.

## 2021-07-09 NOTE — Anesthesia Preprocedure Evaluation (Signed)
Anesthesia Evaluation  Patient identified by MRN, date of birth, ID band Patient awake    Reviewed: Allergy & Precautions, NPO status , Patient's Chart, lab work & pertinent test results  History of Anesthesia Complications Negative for: history of anesthetic complications  Airway Mallampati: III  TM Distance: <3 FB Neck ROM: full    Dental  (+) Chipped   Pulmonary neg shortness of breath, asthma , former smoker,    Pulmonary exam normal        Cardiovascular Exercise Tolerance: Good (-) angina+CHF  negative cardio ROS Normal cardiovascular exam     Neuro/Psych PSYCHIATRIC DISORDERS TIAnegative psych ROS   GI/Hepatic Neg liver ROS, GERD  Controlled,  Endo/Other  Hypothyroidism   Renal/GU      Musculoskeletal  (+) Arthritis ,   Abdominal   Peds  Hematology negative hematology ROS (+)   Anesthesia Other Findings Past Medical History: 2022: CHF (congestive heart failure) (HCC)     Comment:  diastolic 17/00/1749: [id number] No date: Diverticulosis 06/16/2010: DVT (deep venous thrombosis) ([place])     Comment:  after TKR No date: Hyperlipidemia No date: Hyperparathyroidism ([place]) No date: Hypothyroidism No date: IBS (irritable bowel syndrome) No date: Inflammatory arthritis No date: Osteoarthritis, knee No date: Recurrent major depression ([place]) 01/2021: TIA (transient ischemic attack)  Past Surgical History: 06/16/1989: ABDOMINAL HYSTERECTOMY 06/16/2012: CATARACT EXTRACTION W/ INTRAOCULAR LENS  IMPLANT,  BILATERAL 06/16/1996: CHOLECYSTECTOMY No date: COLONOSCOPY 08/15/2007: DEXA     Comment:  Normal 10/15/2014: HAMMER TOE SURGERY; Left     Comment:  Dr Barkley Bruns 06/17/1991: LUMBAR FUSION 04/16/2010: MENISCUS REPAIR; Left 06/16/2010: TOTAL KNEE ARTHROPLASTY; Left  BMI    Body Mass Index: 30.29 kg/m      Reproductive/Obstetrics negative OB ROS                              Anesthesia Physical Anesthesia Plan  ASA: 3  Anesthesia Plan: Spinal   Post-op Pain Management:    Induction:   PONV Risk Score and Plan:   Airway Management Planned: Natural Airway and Nasal Cannula  Additional Equipment:   Intra-op Plan:   Post-operative Plan:   Informed Consent: I have reviewed the patients History and Physical, chart, labs and discussed the procedure including the risks, benefits and alternatives for the proposed anesthesia with the patient or authorized representative who has indicated his/her understanding and acceptance.     Dental Advisory Given  Plan Discussed with: Anesthesiologist, CRNA and Surgeon  Anesthesia Plan Comments: (Patient reports no bleeding problems and no anticoagulant use.  Plan for spinal with backup GA  Patient consented for risks of anesthesia including but not limited to:  - adverse reactions to medications - damage to eyes, teeth, lips or other oral mucosa - nerve damage due to positioning  - risk of bleeding, infection and or nerve damage from spinal that could lead to paralysis - risk of headache or failed spinal - damage to teeth, lips or other oral mucosa - sore throat or hoarseness - damage to heart, brain, nerves, lungs, other parts of body or loss of life  Patient voiced understanding.)        Anesthesia Quick Evaluation

## 2021-07-09 NOTE — Anesthesia Procedure Notes (Signed)
Spinal  Patient location during procedure: OR Start time: 07/09/2021 7:35 AM End time: 07/09/2021 7:48 AM Reason for block: surgical anesthesia Staffing Performed: anesthesiologist  Anesthesiologist: Joud Ingwersen, Precious Haws, MD Preanesthetic Checklist Completed: patient identified, IV checked, site marked, risks and benefits discussed, surgical consent, monitors and equipment checked, pre-op evaluation and timeout performed Spinal Block Patient position: sitting Prep: ChloraPrep Patient monitoring: heart rate, continuous pulse ox, blood pressure and cardiac monitor Approach: midline Location: L3-4 Injection technique: single-shot Needle Needle type: Whitacre and Introducer  Needle gauge: 24 G Needle length: 9 cm Assessment Sensory level: T10 Events: CSF return Additional Notes Sterile aseptic technique used throughout the procedure.  Negative paresthesia. Negative blood return. Positive free-flowing CSF. Expiration date of kit checked and confirmed. Patient tolerated procedure well, without complications.

## 2021-07-09 NOTE — Transfer of Care (Signed)
Immediate Anesthesia Transfer of Care Note  Patient: DANETRA GLOCK  Procedure(s) Performed: TOTAL KNEE ARTHROPLASTY (Right: Knee)  Patient Location: PACU  Anesthesia Type:Spinal  Level of Consciousness: awake and alert   Airway & Oxygen Therapy: Patient Spontanous Breathing and Patient connected to face mask oxygen  Post-op Assessment: Report given to RN and Post -op Vital signs reviewed and stable  Post vital signs: Reviewed and stable  Last Vitals:  Vitals Value Taken Time  BP 132/79 07/09/21 0958  Temp    Pulse 86 07/09/21 1000  Resp 11 07/09/21 1000  SpO2 100 % 07/09/21 1000  Vitals shown include unvalidated device data.  Last Pain:  Vitals:   07/09/21 0650  TempSrc: Oral  PainSc: 0-No pain         Complications: No notable events documented.

## 2021-07-09 NOTE — Progress Notes (Signed)
Informed daughter pt will be going to room 159

## 2021-07-10 ENCOUNTER — Encounter: Payer: Self-pay | Admitting: Surgery

## 2021-07-10 LAB — BASIC METABOLIC PANEL
Anion gap: 9 (ref 5–15)
BUN: 11 mg/dL (ref 8–23)
CO2: 24 mmol/L (ref 22–32)
Calcium: 9.8 mg/dL (ref 8.9–10.3)
Chloride: 101 mmol/L (ref 98–111)
Creatinine, Ser: 0.65 mg/dL (ref 0.44–1.00)
GFR, Estimated: >60 mL/min (ref >60)
Glucose, Bld: 156 mg/dL — ABNORMAL HIGH (ref 70–99)
Potassium: 3.5 mmol/L (ref 3.5–5.1)
Sodium: 134 mmol/L — ABNORMAL LOW (ref 135–145)

## 2021-07-10 LAB — CBC
HCT: 35.9 % — ABNORMAL LOW (ref 36.0–46.0)
Hemoglobin: 11.8 g/dL — ABNORMAL LOW (ref 12.0–15.0)
MCH: 27.9 pg (ref 26.0–34.0)
MCHC: 32.9 g/dL (ref 30.0–36.0)
MCV: 84.9 fL (ref 80.0–100.0)
Platelets: 369 10*3/uL (ref 150–400)
RBC: 4.23 MIL/uL (ref 3.87–5.11)
RDW: 14.9 % (ref 11.5–15.5)
WBC: 13.8 10*3/uL — ABNORMAL HIGH (ref 4.0–10.5)
nRBC: 0 % (ref 0.0–0.2)

## 2021-07-10 MED ORDER — CHLORHEXIDINE GLUCONATE CLOTH 2 % EX PADS
6.0000 | MEDICATED_PAD | Freq: Every day | CUTANEOUS | Status: DC
  Administered 2021-07-10 – 2021-07-12 (×3): 6 via TOPICAL

## 2021-07-10 MED ORDER — CLONIDINE HCL 0.1 MG PO TABS
0.2000 mg | ORAL_TABLET | Freq: Two times a day (BID) | ORAL | Status: DC | PRN
  Administered 2021-07-10: 21:00:00 0.2 mg via ORAL
  Filled 2021-07-10: qty 2

## 2021-07-10 NOTE — Anesthesia Postprocedure Evaluation (Signed)
Anesthesia Post Note  Patient: Wendy Murray  Procedure(s) Performed: TOTAL KNEE ARTHROPLASTY (Right: Knee)  Patient location during evaluation: Nursing Unit Anesthesia Type: Spinal Level of consciousness: oriented and awake and alert Pain management: pain level controlled Vital Signs Assessment: post-procedure vital signs reviewed and stable Respiratory status: spontaneous breathing Cardiovascular status: blood pressure returned to baseline and stable Postop Assessment: no headache, no backache, no apparent nausea or vomiting and patient able to bend at knees Anesthetic complications: no   No notable events documented.   Last Vitals:  Vitals:   07/10/21 0257 07/10/21 0819  BP: (!) 165/86 (!) 181/82  Pulse: 100 97  Resp: 16 18  Temp: 37 C 36.7 C  SpO2: 90% 94%    Last Pain:  Vitals:   07/10/21 0819  TempSrc:   PainSc: 10-Worst pain ever                 Precious Haws Kaicee Scarpino

## 2021-07-10 NOTE — TOC Initial Note (Signed)
Transition of Care [facility]) - Initial/Assessment Note    Patient Details  Name: Wendy Murray MRN: [id number] Date of Birth: 29-Jan-1936  Transition of Care [facility]) CM/SW Contact:    Conception Oms, RN Phone Number: 07/10/2021, 2:55 PM  Clinical Narrative:    The patient lives at [facility] and plans to go to [place] [facility], will transport via EMS                      Patient Goals and CMS Choice        Expected Discharge Plan and Services                                                Prior Living Arrangements/Services                       Activities of Daily [place] Devices/Equipment: Eyeglasses, Hearing aid (bilateral) ADL Screening (condition at time of admission) Patient's cognitive ability adequate to safely complete daily activities?: Yes Is the patient deaf or have difficulty hearing?: Yes Does the patient have difficulty seeing, even when wearing glasses/contacts?: No Does the patient have difficulty concentrating, remembering, or making decisions?: No Patient able to express need for assistance with ADLs?: Yes Does the patient have difficulty dressing or bathing?: No Independently performs ADLs?: Yes (appropriate for developmental age) Does the patient have difficulty walking or climbing stairs?: Yes (knee pain) Weakness of Legs: Both Weakness of Arms/Hands: None  Permission Sought/Granted                  Emotional Assessment              Admission diagnosis:  Status post total knee replacement using cement, right [Z96.651] Patient Active Problem List   Diagnosis Date Noted   Status post total knee replacement using cement, right 07/09/2021   Urinary frequency 04/08/2021   Asthmatic bronchitis    Chronic diastolic CHF (congestive heart failure) ([place]) 03/23/2021   Neck pain 03/12/2021   Osteoarthritis of right knee 03/12/2021   TIA (transient ischemic attack) 01/23/2021   GERD  (gastroesophageal reflux disease) 12/16/2016   Hyperparathyroidism ([place])    Advance directive discussed with patient 12/13/2014   Routine general medical examination at a health care facility 12/12/2013   Hyperlipidemia    Recurrent major depression ([place])    Hypothyroidism    Osteoarthritis, knee    IBS (irritable bowel syndrome)    PCP:  Venia Carbon, MD Pharmacy:   [facility] [id number], [place] [address] Phone: [phone] Fax: [phone]  [id number] [address] [place] [address] [place] [address] Phone: [phone] Fax: [phone]     Social Determinants of Health (SDOH) Interventions    Readmission Risk Interventions No flowsheet data found.

## 2021-07-10 NOTE — Progress Notes (Signed)
Physical Therapy Treatment Patient Details Name: Wendy Murray MRN: [id number] DOB: Jan 24, 1936 Today's Date: 07/10/2021   History of Present Illness Pt admitted for R TKR. HIstory of asthma, CHF, GERD, HLD, and IBS. Pt is POD 0 at time of evaluation.    PT Comments    Pt is making good progress towards goals with ability to ambulate short distance to recliner, however needs physical assist and significant cues for sequencing due to fear/anxiety. Good effort during supine there-ex and gradual progress towards ROM. ENcouraged to stay up in recliner with towel roll under ankle. Will continue to progress as able. Premedicated with pain meds prior, however still reports 7/10 pain with therapy.   Recommendations for follow up therapy are one component of a multi-disciplinary discharge planning process, led by the attending physician.  Recommendations may be updated based on patient status, additional functional criteria and insurance authorization.  Follow Up Recommendations  Skilled nursing-short term rehab (<3 hours/day)     Assistance Recommended at Discharge Intermittent Supervision/Assistance  Patient can return home with the following A lot of help with walking and/or transfers   Equipment Recommendations  None recommended by PT    Recommendations for Other Services       Precautions / Restrictions Precautions Precautions: Fall;Knee Precaution Booklet Issued: No Restrictions Weight Bearing Restrictions: Yes RLE Weight Bearing: Weight bearing as tolerated     Mobility  Bed Mobility Overal bed mobility: Needs Assistance Bed Mobility: Supine to Sit     Supine to sit: Mod assist     General bed mobility comments: needs assist for sequencing and moving surgical leg. Anxious with all mobility    Transfers Overall transfer level: Needs assistance Equipment used: Rolling walker (2 wheels) Transfers: Sit to/from Stand Sit to Stand: Mod assist           General  transfer comment: limited recall from previous session. Cues for pushing from seated surface. Once standing, upright posture noted, significant guarding noted towards surgical side.    Ambulation/Gait Ambulation/Gait assistance: Min assist Gait Distance (Feet): 3 Feet Assistive device: Rolling walker (2 wheels) Gait Pattern/deviations: Step-to pattern       General Gait Details: ambulated using step to gait pattern and very guarded towards surgical site.Fatiges quickly and very slow technique.   Stairs             Wheelchair Mobility    Modified Rankin (Stroke Patients Only)       Balance Overall balance assessment: Needs assistance Sitting-balance support: Feet supported Sitting balance-Leahy Scale: Good     Standing balance support: Bilateral upper extremity supported Standing balance-Leahy Scale: Good                              Cognition Arousal/Alertness: Awake/alert Behavior During Therapy: WFL for tasks assessed/performed Overall Cognitive Status: Within Functional Limits for tasks assessed                                          Exercises Total Joint Exercises Goniometric ROM: R knee 11-75 degrees Other Exercises Other Exercises: supine ther-ex performed on R LE including AP, quad sets, SAQ, SLR, and hip abd/add 12 reps with min/mod assist    General Comments        Pertinent Vitals/Pain Pain Assessment Pain Assessment: 0-10 Pain Score: 7  Pain Location: R knee  Pain Descriptors / Indicators: Operative site guarding Pain Intervention(s): Limited activity within patient's tolerance, Ice applied, Premedicated before session    Home Living                          Prior Function            PT Goals (current goals can now be found in the care plan section) Acute Rehab PT Goals Patient Stated Goal: to go to SNF PT Goal Formulation: With patient Time For Goal Achievement: 07/23/21 Potential to  Achieve Goals: Good Progress towards PT goals: Progressing toward goals    Frequency    BID      PT Plan Current plan remains appropriate    Co-evaluation              AM-PAC PT "6 Clicks" Mobility   Outcome Measure  Help needed turning from your back to your side while in a flat bed without using bedrails?: A Lot Help needed moving from lying on your back to sitting on the side of a flat bed without using bedrails?: A Lot Help needed moving to and from a bed to a chair (including a wheelchair)?: A Lot Help needed standing up from a chair using your arms (e.g., wheelchair or bedside chair)?: A Lot Help needed to walk in hospital room?: Total Help needed climbing 3-5 steps with a railing? : Total 6 Click Score: 10    End of Session Equipment Utilized During Treatment: Gait belt Activity Tolerance: Patient tolerated treatment well Patient left: in chair;with chair alarm set Nurse Communication: Mobility status PT Visit Diagnosis: Muscle weakness (generalized) (M62.81);Difficulty in walking, not elsewhere classified (R26.2);Pain Pain - Right/Left: Right Pain - part of body: Knee     Time: 3559-7416 PT Time Calculation (min) (ACUTE ONLY): 28 min  Charges:  $Gait Training: 8-22 mins $Therapeutic Exercise: 8-22 mins                     Greggory Stallion, PT, DPT, GCS [phone]    Rhianon Zabawa 07/10/2021, 12:27 PM

## 2021-07-10 NOTE — Progress Notes (Signed)
Physical Therapy Treatment Patient Details Name: Wendy Murray MRN: [id number] DOB: 1935-11-01 Today's Date: 07/10/2021   History of Present Illness Pt admitted for R TKR. HIstory of asthma, CHF, GERD, HLD, and IBS. Pt is POD 0 at time of evaluation.    PT Comments    Pt is very pain limited this afternoon and reports she hasn't been medicated since early AM. Assisted pt in placing call to RN to ask for pain meds. Agreeable and tolerated limited there-ex, however declines to perform any mobility tasks stating "I just cant do it today." Assisted with positioning in bed as pt has R knee bent, no polar care on, and SCDs disconnected. Encouraged to continue discussion with RN about timing of pain meds in order to improve participation with therapy. Will continue to progress as able.  Recommendations for follow up therapy are one component of a multi-disciplinary discharge planning process, led by the attending physician.  Recommendations may be updated based on patient status, additional functional criteria and insurance authorization.  Follow Up Recommendations  Skilled nursing-short term rehab (<3 hours/day)     Assistance Recommended at Discharge Frequent or constant Supervision/Assistance  Patient can return home with the following A lot of help with walking and/or transfers   Equipment Recommendations  None recommended by PT    Recommendations for Other Services       Precautions / Restrictions Precautions Precautions: Fall;Knee Precaution Booklet Issued: Yes (comment) Restrictions Weight Bearing Restrictions: Yes RLE Weight Bearing: Weight bearing as tolerated     Mobility  Bed Mobility Overal bed mobility: Needs Assistance Bed Mobility: Supine to Sit     Supine to sit: Mod assist     General bed mobility comments: Pt with legs hanging off side of bed upon arrival. Pt required mod assist for repositioning back into bed including placing towel roll under ankle.     Transfers Overall transfer level: Needs assistance Equipment used: Rolling walker (2 wheels) Transfers: Sit to/from Stand Sit to Stand: Mod assist           General transfer comment: unable to tolerate this afternoon.    Ambulation/Gait Ambulation/Gait assistance: Min assist Gait Distance (Feet): 3 Feet Assistive device: Rolling walker (2 wheels) Gait Pattern/deviations: Step-to pattern       General Gait Details: ambulated using step to gait pattern and very guarded towards surgical site.Fatiges quickly and very slow technique.   Stairs             Wheelchair Mobility    Modified Rankin (Stroke Patients Only)       Balance Overall balance assessment: Needs assistance Sitting-balance support: Feet supported Sitting balance-Leahy Scale: Good     Standing balance support: Bilateral upper extremity supported Standing balance-Leahy Scale: Good                              Cognition Arousal/Alertness: Awake/alert Behavior During Therapy: WFL for tasks assessed/performed Overall Cognitive Status: Within Functional Limits for tasks assessed                                 General Comments: starts crying during session due to pain, limiting session        Exercises Total Joint Exercises Goniometric ROM: R knee 11-75 degrees Other Exercises Other Exercises: limited supine ther-ex performed on R LE including AP, quad sets, SLRs, and hip abd/add. 12 reps  with mod assist.    General Comments        Pertinent Vitals/Pain Pain Assessment Pain Assessment: 0-10 Pain Score: 9  Pain Location: R knee Pain Descriptors / Indicators: Grimacing, Sore, Guarding Pain Intervention(s): Limited activity within patient's tolerance, Patient requesting pain meds-RN notified, Ice applied, RN gave pain meds during session    Home Living Family/patient expects to be discharged to:: Private residence Living Arrangements: Other (Comment) ([facility]) Available Help at Discharge: Family Type of Home: Independent living facility Home Access: Level entry       Home Layout: One level Home Equipment: Grab bars - toilet;Grab bars - tub/shower;Rolling Walker (2 wheels);Rollator (4 wheels);Cane - single point;Toilet riser;Shower seat Additional Comments: husband just recently passed away 2 weeks ago    Prior Function            PT Goals (current goals can now be found in the care plan section) Acute Rehab PT Goals Patient Stated Goal: to go to SNF PT Goal Formulation: With patient Time For Goal Achievement: 07/23/21 Potential to Achieve Goals: Good Progress towards PT goals: Progressing toward goals    Frequency    BID      PT Plan Current plan remains appropriate    Co-evaluation              AM-PAC PT "6 Clicks" Mobility   Outcome Measure  Help needed turning from your back to your side while in a flat bed without using bedrails?: A Lot Help needed moving from lying on your back to sitting on the side of a flat bed without using bedrails?: A Lot Help needed moving to and from a bed to a chair (including a wheelchair)?: A Lot Help needed standing up from a chair using your arms (e.g., wheelchair or bedside chair)?: A Lot Help needed to walk in hospital room?: Total Help needed climbing 3-5 steps with a railing? : Total 6 Click Score: 10    End of Session Equipment Utilized During Treatment: Gait belt Activity Tolerance: Patient limited by pain Patient left: in bed;with chair alarm set;with SCD's reapplied Nurse Communication: Mobility status PT Visit Diagnosis: Muscle weakness (generalized) (M62.81);Difficulty in walking, not elsewhere classified (R26.2);Pain Pain - Right/Left: Right Pain - part of body: Knee     Time: 1346-1400 PT Time Calculation (min) (ACUTE ONLY): 14 min  Charges:  $Gait Training: 8-22 mins $Therapeutic Exercise: 8-22 mins                     Greggory Stallion, PT, DPT,  GCS [phone]    Kien Mirsky 07/10/2021, 3:55 PM

## 2021-07-10 NOTE — Progress Notes (Signed)
Subjective: 1 Day Post-Op Procedure(s) (LRB): TOTAL KNEE ARTHROPLASTY (Right) Patient reports pain as mild.   Patient is well, and has had no acute complaints or problems Patient currently lives at [facility], plan is to go to SNF at [facility]. Negative for chest pain and shortness of breath Fever: no Gastrointestinal:Negative for nausea and vomiting Patient reports she is not passing much gas at this time.  Objective: Vital signs in last 24 hours: Temp:  [97.9 F (36.6 C)-98.6 F (37 C)] 98.6 F (37 C) (01/25 0257) Pulse Rate:  [80-100] 100 (01/25 0257) Resp:  [10-17] 16 (01/25 0257) BP: (132-176)/(69-91) 165/86 (01/25 0257) SpO2:  [90 %-100 %] 90 % (01/25 0257)  Intake/Output from previous day:  Intake/Output Summary (Last 24 hours) at 07/10/2021 0800 Last data filed at 07/10/2021 0525 Gross per 24 hour  Intake 1618.87 ml  Output 2895 ml  Net -1276.13 ml    Intake/Output this shift: No intake/output data recorded.  Labs: No results for input(s): HGB in the last 72 hours. No results for input(s): WBC, RBC, HCT, PLT in the last 72 hours. No results for input(s): NA, K, CL, CO2, BUN, CREATININE, GLUCOSE, CALCIUM in the last 72 hours. No results for input(s): LABPT, INR in the last 72 hours.   EXAM General - Patient is Alert, Appropriate, and Oriented Extremity - ABD soft Neurovascular intact Dorsiflexion/Plantar flexion intact Incision: scant drainage No cellulitis present Compartment soft Dressing/Incision - Mild blood tinged drainage noted to the right knee Honeycomb dressing. Motor Function - intact, moving foot and toes well on exam.  Abdomen soft with intact bowel sounds this morning.  Past Medical History:  Diagnosis Date   CHF (congestive heart failure) ([place]) 7425   diastolic   [id number] 64/33/2951   Diverticulosis    DVT (deep venous thrombosis) ([place]) 06/16/2010   after TKR   Hyperlipidemia    Hyperparathyroidism ([place])    Hypothyroidism    IBS  (irritable bowel syndrome)    Inflammatory arthritis    Osteoarthritis, knee    Recurrent major depression ([facility])    TIA (transient ischemic attack) 01/2021    Assessment/Plan: 1 Day Post-Op Procedure(s) (LRB): TOTAL KNEE ARTHROPLASTY (Right) Principal Problem:   Status post total knee replacement using cement, right  Estimated body mass index is 30.29 kg/m as calculated from the following:   Height as of this encounter: 5\' 5"  (1.651 m).   Weight as of this encounter: 82.6 kg. Advance diet Up with therapy D/C IV fluids when tolerating po intake.  Labs not available this AM, will re-check later. Up with therapy today, plan is for d/c to SNF at [facility]. Begin working on BM, patient reports not passing much gas this morning.  DVT Prophylaxis - TED hose and Eliquis Weight-Bearing as tolerated to right leg  J. Cameron Proud, PA-C [facility] Orthopaedic Surgery 07/10/2021, 8:00 AM

## 2021-07-10 NOTE — Evaluation (Signed)
Occupational Therapy Evaluation Patient Details Name: Wendy Murray MRN: [id number] DOB: 12-29-1935 Today's Date: 07/10/2021   History of Present Illness Pt admitted for R TKR. HIstory of asthma, CHF, GERD, HLD, and IBS. Pt is POD 0 at time of evaluation.   Clinical Impression   Wendy Murray was seen for OT evaluation this date, POD#1 from above surgery. Pt was independent in all ADLs prior to surgery. She denies use of AE for functional mobility and denies falls history in the past 6 months. Pt is eager to return to PLOF with less pain and improved safety and independence. Pt currently requires minimal assist for LB dressing while in seated position due to pain and limited AROM of R knee. Pt instructed in polar care mgt, falls prevention strategies, home/routines modifications, DME/AE for LB bathing and dressing tasks. Education limited as pt becomes lethargic toward end of session requests this [place] return at a later time. Pt would benefit from skilled OT services including additional instruction in dressing techniques with or without assistive devices for dressing and bathing skills to support recall and carryover prior to discharge and ultimately to maximize safety, independence, and minimize falls risk and caregiver burden. Recommend STR upon acute hospital DC.          Recommendations for follow up therapy are one component of a multi-disciplinary discharge planning process, led by the attending physician.  Recommendations may be updated based on patient status, additional functional criteria and insurance authorization.   Follow Up Recommendations  Skilled nursing-short term rehab (<3 hours/day)    Assistance Recommended at Discharge Intermittent Supervision/Assistance  Patient can return home with the following A lot of help with walking and/or transfers;A lot of help with bathing/dressing/bathroom    Functional Status Assessment  Patient has had a recent decline in their  functional status and demonstrates the ability to make significant improvements in function in a reasonable and predictable amount of time.  Equipment Recommendations  BSC/3in1    Recommendations for Other Services       Precautions / Restrictions Precautions Precautions: Fall;Knee Precaution Booklet Issued: No Restrictions Weight Bearing Restrictions: Yes RLE Weight Bearing: Weight bearing as tolerated      Mobility Bed Mobility               General bed mobility comments: Deferred. Pt in recliner at start/end of session.    Transfers                   General transfer comment: Pt declines functional mobility 2/2 lethargy/nausea. Educated on benefits of mobility.      Balance Overall balance assessment: Needs assistance Sitting-balance support: Feet supported Sitting balance-Leahy Scale: Good Sitting balance - Comments: Steady staitic sitting, reaching within BOS.                                   ADL either performed or assessed with clinical judgement   ADL Overall ADL's : Needs assistance/impaired                                       General ADL Comments: Pt functionally limited by increased RLE pain and decreased AROM. She requires MAX A to manage compression stockings & polar care. MOD A for LB dressing in seated position. MOD A for toileting/transfers.     Vision Patient  Visual Report: No change from baseline       Perception     Praxis      Pertinent Vitals/Pain Pain Assessment Pain Assessment: 0-10 Pain Score: 7  Pain Location: R knee Pain Descriptors / Indicators: Grimacing, Sore, Guarding Pain Intervention(s): Limited activity within patient's tolerance, Monitored during session, Utilized relaxation techniques, Ice applied, Premedicated before session     Hand Dominance Right   Extremity/Trunk Assessment Upper Extremity Assessment Upper Extremity Assessment: Overall WFL for tasks assessed    Lower Extremity Assessment Lower Extremity Assessment: RLE deficits/detail RLE Deficits / Details: s/p R TKA       Communication Communication Communication: No difficulties   Cognition Arousal/Alertness: Awake/alert, Lethargic, Suspect due to medications (becomes lethargic as session progresses.) Behavior During Therapy: WFL for tasks assessed/performed Overall Cognitive Status: Within Functional Limits for tasks assessed                                       General Comments       Exercises Other Exercises Other Exercises: Pt educated on role of OT in acute setting, knee precautions, falls prevention strategies, safe use of AE/DME for ADL management, and polar care management.   Shoulder Instructions      Home Living Family/patient expects to be discharged to:: Private residence Living Arrangements: Other (Comment) [facility]) Available Help at Discharge: Family Type of Home: Independent living facility Home Access: Level entry     Home Layout: One level     Bathroom Shower/Tub: Occupational psychologist: Handicapped height Bathroom Accessibility: Yes How Accessible: Accessible via wheelchair;Accessible via walker [place]: Grab bars - toilet;Grab bars - tub/shower;Rolling Walker (2 wheels);Rollator (4 wheels);Cane - single point;Toilet riser;Shower seat   Additional Comments: husband just recently passed away 2 weeks ago      Prior Functioning/Environment Prior Level of Function : Independent/Modified Independent;Driving             Mobility Comments: no falls, wasn't using AD          OT Problem List: Decreased strength;Decreased coordination;Pain;Decreased range of motion;Decreased activity tolerance;Decreased safety awareness;Impaired balance (sitting and/or standing);Decreased knowledge of use of DME or AE;Decreased knowledge of precautions      OT Treatment/Interventions: Self-care/ADL training;Therapeutic  exercise;Therapeutic activities;DME and/or AE instruction;Patient/family education;Balance training;Energy conservation    OT Goals(Current goals can be found in the care plan section) Acute Rehab OT Goals Patient Stated Goal: To go to rehab OT Goal Formulation: With patient Time For Goal Achievement: 07/24/21 Potential to Achieve Goals: Good  OT Frequency: Min 2X/week    Co-evaluation              AM-PAC OT "6 Clicks" Daily Activity     Outcome Measure Help from another person eating meals?: None Help from another person taking care of personal grooming?: None Help from another person toileting, which includes using toliet, bedpan, or urinal?: A Lot Help from another person bathing (including washing, rinsing, drying)?: A Lot Help from another person to put on and taking off regular upper body clothing?: A Little Help from another person to put on and taking off regular lower body clothing?: A Lot 6 Click Score: 17   End of Session Equipment Utilized During Treatment:  (LH reacher, sock aid.)  Activity Tolerance: Patient limited by lethargy;Other (comment) (nausea) Patient left: in chair;with call bell/phone within reach;with chair alarm set  OT  Visit Diagnosis: Other abnormalities of gait and mobility (R26.89);Pain Pain - Right/Left: Right Pain - part of body: Knee                Time: 4471-5806 OT Time Calculation (min): 21 min Charges:  OT General Charges $OT Visit: 1 Visit OT Evaluation $OT Eval Low Complexity: 1 Low OT Treatments $Self Care/Home Management : 8-22 mins  Shara Blazing, M.S., OTR/L [facility] Ascom: [phone] 07/10/21, 12:44 PM

## 2021-07-10 NOTE — NC FL2 (Signed)
[place] LEVEL OF CARE SCREENING TOOL     IDENTIFICATION  Patient Name: Wendy Murray Birthdate: [birth year] Sex: female Admission Date (Current Location): 07/09/2021  [facility] and [place] Number:  Engineering geologist and Address:  [facility], [address] [place], [place] [address]      Provider Number: [id number]  Attending Physician Name and Address:  Corky Mull, MD  Relative Name and Phone Number:  Peg [phone]    Current Level of Care: Hospital Recommended Level of Care: [place] Prior Approval Number:    Date Approved/Denied:   PASRR Number: [id number]  Discharge Plan: SNF    Current Diagnoses: Patient Active Problem List   Diagnosis Date Noted   Status post total knee replacement using cement, right 07/09/2021   Urinary frequency 04/08/2021   Asthmatic bronchitis    Chronic diastolic CHF (congestive heart failure) ([place]) 03/23/2021   Neck pain 03/12/2021   Osteoarthritis of right knee 03/12/2021   TIA (transient ischemic attack) 01/23/2021   GERD (gastroesophageal reflux disease) 12/16/2016   Hyperparathyroidism ([place])    Advance directive discussed with patient 12/13/2014   Routine general medical examination at a health care facility 12/12/2013   Hyperlipidemia    Recurrent major depression (HCC)    Hypothyroidism    Osteoarthritis, knee    IBS (irritable bowel syndrome)     Orientation RESPIRATION BLADDER Height & Weight     Self, Time, Situation, Place  Normal Continent Weight: 82.6 kg Height:  5\' 5"  (165.1 cm)  BEHAVIORAL SYMPTOMS/MOOD NEUROLOGICAL BOWEL NUTRITION STATUS      Continent Diet (regular)  AMBULATORY STATUS COMMUNICATION OF NEEDS Skin   Extensive Assist Verbally Normal, Surgical wounds                       Personal Care Assistance Level of Assistance  Bathing, Feeding, Dressing Bathing Assistance: Limited assistance Feeding assistance:  Independent Dressing Assistance: Limited assistance     Functional Limitations Info             SPECIAL CARE FACTORS FREQUENCY  PT (By licensed PT), OT (By licensed OT)     PT Frequency: 5 times per week OT Frequency: 5 times per week            Contractures Contractures Info: Not present    Additional Factors Info  Code Status, Allergies Code Status Info: full code Allergies Info: NKDA           Current Medications (07/10/2021):  This is the current hospital active medication list Current Facility-Administered Medications  Medication Dose Route Frequency Provider Last Rate Last Admin   0.9 %  sodium chloride infusion   Intravenous Continuous Poggi, Marshall Cork, MD 75 mL/hr at 07/09/21 1434 New Bag at 07/09/21 1434   acetaminophen (TYLENOL) tablet 325-650 mg  325-650 mg Oral Q6H PRN Poggi, Marshall Cork, MD       acidophilus (RISAQUAD) capsule 1 capsule  1 capsule Oral QHS Poggi, Marshall Cork, MD   1 capsule at 07/09/21 2121   albuterol (PROVENTIL) (2.5 MG/3ML) 0.083% nebulizer solution 2.5 mg  2.5 mg Nebulization Q6H PRN Poggi, Marshall Cork, MD       apixaban Arne Cleveland) tablet 2.5 mg  2.5 mg Oral BID Poggi, Marshall Cork, MD   2.5 mg at 07/10/21 0835   atorvastatin (LIPITOR) tablet 40 mg  40 mg Oral QPM Poggi, Marshall Cork, MD   40 mg at 07/09/21 1705   bisacodyl (  DULCOLAX) suppository 10 mg  10 mg Rectal Daily PRN Poggi, Marshall Cork, MD       Chlorhexidine Gluconate Cloth 2 % PADS 6 each  6 each Topical Daily Poggi, Marshall Cork, MD   6 each at 07/10/21 [phone]   cholecalciferol (VITAMIN D3) tablet 5,000 Units  5,000 Units Oral Daily Poggi, Marshall Cork, MD   5,000 Units at 07/10/21 5329   clonazePAM (KLONOPIN) tablet 1 mg  1 mg Oral QHS Poggi, Marshall Cork, MD   1 mg at 07/09/21 2121   diphenhydrAMINE (BENADRYL) 12.5 MG/5ML elixir 12.5-25 mg  12.5-25 mg Oral Q4H PRN Poggi, Marshall Cork, MD       docusate sodium (COLACE) capsule 100 mg  100 mg Oral BID Corky Mull, MD   100 mg at 07/10/21 9242   ferrous sulfate tablet 325 mg  325 mg  Oral Daily Poggi, Marshall Cork, MD   325 mg at 07/10/21 6834   HYDROmorphone (DILAUDID) injection 0.25-0.5 mg  0.25-0.5 mg Intravenous Q3H PRN Corky Mull, MD   0.5 mg at 07/10/21 1962   levothyroxine (SYNTHROID) tablet 75 mcg  75 mcg Oral Q0600 Corky Mull, MD   75 mcg at 07/10/21 0514   magnesium hydroxide (MILK OF MAGNESIA) suspension 30 mL  30 mL Oral Daily PRN Poggi, Marshall Cork, MD       metoCLOPramide (REGLAN) tablet 5-10 mg  5-10 mg Oral Q8H PRN Poggi, Marshall Cork, MD       Or   metoCLOPramide (REGLAN) injection 5-10 mg  5-10 mg Intravenous Q8H PRN Poggi, Marshall Cork, MD       mirtazapine (REMERON) tablet 15 mg  15 mg Oral QHS Poggi, Marshall Cork, MD   15 mg at 07/09/21 2121   multivitamin with minerals tablet 2 tablet  2 tablet Oral Daily Poggi, Marshall Cork, MD   2 tablet at 07/10/21 0836   ondansetron (ZOFRAN) tablet 4 mg  4 mg Oral Q6H PRN Poggi, Marshall Cork, MD       Or   ondansetron (ZOFRAN) injection 4 mg  4 mg Intravenous Q6H PRN Poggi, Marshall Cork, MD       oxyCODONE (Oxy IR/ROXICODONE) immediate release tablet 5-10 mg  5-10 mg Oral Q4H PRN Poggi, Marshall Cork, MD   10 mg at 07/10/21 1357   pantoprazole (PROTONIX) EC tablet 40 mg  40 mg Oral Daily Poggi, Marshall Cork, MD   40 mg at 07/10/21 0835   sodium phosphate (FLEET) 7-19 GM/118ML enema 1 enema  1 enema Rectal Once PRN Poggi, Marshall Cork, MD       tiZANidine (ZANAFLEX) tablet 2-4 mg  2-4 mg Oral QHS PRN Poggi, Marshall Cork, MD       traMADol Veatrice Bourbon) tablet 50 mg  50 mg Oral Q6H PRN Poggi, Marshall Cork, MD   50 mg at 07/10/21 1037   venlafaxine XR (EFFEXOR-XR) 24 hr capsule 225 mg  225 mg Oral Q breakfast Poggi, Marshall Cork, MD   225 mg at 07/10/21 2297     Discharge Medications: Please see discharge summary for a list of discharge medications.  Relevant Imaging Results:  Relevant Lab Results:   Additional Information SS# [id number]  Conception Oms, RN

## 2021-07-11 ENCOUNTER — Inpatient Hospital Stay: Payer: Medicare Other

## 2021-07-11 LAB — BASIC METABOLIC PANEL
Anion gap: 7 (ref 5–15)
BUN: 8 mg/dL (ref 8–23)
CO2: 26 mmol/L (ref 22–32)
Calcium: 9.6 mg/dL (ref 8.9–10.3)
Chloride: 96 mmol/L — ABNORMAL LOW (ref 98–111)
Creatinine, Ser: 0.62 mg/dL (ref 0.44–1.00)
GFR, Estimated: >60 mL/min (ref >60)
Glucose, Bld: 136 mg/dL — ABNORMAL HIGH (ref 70–99)
Potassium: 3.5 mmol/L (ref 3.5–5.1)
Sodium: 129 mmol/L — ABNORMAL LOW (ref 135–145)

## 2021-07-11 LAB — URINALYSIS, COMPLETE (UACMP) WITH MICROSCOPIC
Bacteria, UA: NONE SEEN
Bilirubin Urine: NEGATIVE
Glucose, UA: NEGATIVE mg/dL
Ketones, ur: NEGATIVE mg/dL
Leukocytes,Ua: NEGATIVE
Nitrite: NEGATIVE
Protein, ur: 30 mg/dL — AB
Specific Gravity, Urine: 1.015 (ref 1.005–1.030)
pH: 5.5 (ref 5.0–8.0)

## 2021-07-11 LAB — RESP PANEL BY RT-PCR (FLU A&B, COVID) ARPGX2
Influenza A by PCR: NEGATIVE
Influenza B by PCR: NEGATIVE
SARS Coronavirus 2 by RT PCR: NEGATIVE

## 2021-07-11 LAB — CBC
HCT: 32.2 % — ABNORMAL LOW (ref 36.0–46.0)
Hemoglobin: 10.9 g/dL — ABNORMAL LOW (ref 12.0–15.0)
MCH: 28.5 pg (ref 26.0–34.0)
MCHC: 33.9 g/dL (ref 30.0–36.0)
MCV: 84.1 fL (ref 80.0–100.0)
Platelets: 316 10*3/uL (ref 150–400)
RBC: 3.83 MIL/uL — ABNORMAL LOW (ref 3.87–5.11)
RDW: 15 % (ref 11.5–15.5)
WBC: 16.3 10*3/uL — ABNORMAL HIGH (ref 4.0–10.5)
nRBC: 0 % (ref 0.0–0.2)

## 2021-07-11 MED ORDER — TRAMADOL HCL 50 MG PO TABS
50.0000 mg | ORAL_TABLET | Freq: Four times a day (QID) | ORAL | Status: DC | PRN
  Administered 2021-07-11: 50 mg via ORAL
  Filled 2021-07-11: qty 1

## 2021-07-11 NOTE — Progress Notes (Signed)
°  Progress Note   Date: 07/11/2021  Patient Name: Wendy Murray        MRN#: [id number]  Review the patients clinical findings supports the diagnosis of:   Hyponatremia   The patient is asymptomatic at this time, so I am not ready to give her the diagnosis of hyponatremia.  Thanks!

## 2021-07-11 NOTE — Plan of Care (Signed)

## 2021-07-11 NOTE — Progress Notes (Addendum)
Subjective: 2 Days Post-Op Procedure(s) (LRB): TOTAL KNEE ARTHROPLASTY (Right) Patient reports pain as mild this morning, states that she is doing much better when compared to yesterday afternoon. Patient is well, is currently on supplemental O2.  89% on room air this morning, will wean off today. Patient currently lives at [facility], plan is to go to SNF at [facility]. Negative for chest pain and shortness of breath Fever: no Gastrointestinal:Negative for nausea and vomiting She is passing gas but has not had a BM although there is a BM documented in her chart.  Objective: Vital signs in last 24 hours: Temp:  [97.5 F (36.4 C)-99.5 F (37.5 C)] 97.5 F (36.4 C) (01/26 0830) Pulse Rate:  [95-108] 95 (01/26 0830) Resp:  [17-18] 17 (01/26 0830) BP: (138-187)/(75-91) 138/91 (01/26 0830) SpO2:  [89 %-95 %] 95 % (01/26 0830)  Intake/Output from previous day:  Intake/Output Summary (Last 24 hours) at 07/11/2021 1014 Last data filed at 07/11/2021 0500 Gross per 24 hour  Intake 240 ml  Output 850 ml  Net -610 ml    Intake/Output this shift: No intake/output data recorded.  Labs: Recent Labs    07/10/21 0723 07/11/21 0626  HGB 11.8* 10.9*   Recent Labs    07/10/21 0723 07/11/21 0626  WBC 13.8* 16.3*  RBC 4.23 3.83*  HCT 35.9* 32.2*  PLT 369 316   Recent Labs    07/10/21 0723 07/11/21 0626  NA 134* 129*  K 3.5 3.5  CL 101 96*  CO2 24 26  BUN 11 8  CREATININE 0.65 0.62  GLUCOSE 156* 136*  CALCIUM 9.8 9.6   No results for input(s): LABPT, INR in the last 72 hours.   EXAM General - Patient is Alert, Appropriate, and Oriented Extremity - ABD soft Neurovascular intact Dorsiflexion/Plantar flexion intact Incision: scant drainage No cellulitis present Compartment soft Dressing/Incision - Mild blood tinged drainage noted to the right knee Honeycomb dressing. Motor Function - intact, moving foot and toes well on exam.  Abdomen soft with intact bowel sounds this  morning.  Past Medical History:  Diagnosis Date   CHF (congestive heart failure) ([place]) 8546   diastolic   [id number] 93/81/8299   Diverticulosis    DVT (deep venous thrombosis) ([place]) 06/16/2010   after TKR   Hyperlipidemia    Hyperparathyroidism ([place])    Hypothyroidism    IBS (irritable bowel syndrome)    Inflammatory arthritis    Osteoarthritis, knee    Recurrent major depression (HCC)    TIA (transient ischemic attack) 01/2021    Assessment/Plan: 2 Days Post-Op Procedure(s) (LRB): TOTAL KNEE ARTHROPLASTY (Right) Principal Problem:   Status post total knee replacement using cement, right  Estimated body mass index is 30.29 kg/m as calculated from the following:   Height as of this encounter: 5\' 5"  (1.651 m).   Weight as of this encounter: 82.6 kg. Advance diet Up with therapy D/C IV fluids when tolerating po intake.  Labs reviewed this AM.  WBC up to 16.3.  Will obtain a chest x-ray as a precaution. Na 129, encouraged increased oral intake. Encouraged incentive spirometer.  Wean off O2. Continue to work on BM.  Apply TED Hose to both legs. Up with therapy today, plan is for d/c to SNF at [facility].  DVT Prophylaxis - TED hose and Eliquis Weight-Bearing as tolerated to right leg  J. Cameron Proud, PA-C [facility] Orthopaedic Surgery 07/11/2021, 10:14 AM

## 2021-07-11 NOTE — Progress Notes (Signed)
Physical Therapy Treatment Patient Details Name: Wendy Murray MRN: [id number] DOB: September 15, 1935 Today's Date: 07/11/2021   History of Present Illness Pt admitted for R TKR. HIstory of asthma, CHF, GERD, HLD, and IBS. Pt is POD 0 at time of evaluation.    PT Comments    Pt is making gradual progress towards goals and is very lethargic this afternoon. On first attempt, pt sleeping in recliner with lunch tray untouched. Pt easily awakened and requested to eat lunch. Therapist came back to room and pt still remained sleepy, however more alert with exertion. Pt initially agreeable to ambulate in room, however is having significant difficulty this session with transfers, pain, fatigue. Ambulated to bed and pt with uncontrolled decent back to EOB due to buckling of both knees. No dizziness, but pt just reports her arms and legs were just "too tired" to do anymore. Repositioned in bed for comfort. Educated pt on safe transfers and encouraged to continue getting up with assistance to [facility]. Will continue to progress as able.  Recommendations for follow up therapy are one component of a multi-disciplinary discharge planning process, led by the attending physician.  Recommendations may be updated based on patient status, additional functional criteria and insurance authorization.  Follow Up Recommendations  Skilled nursing-short term rehab (<3 hours/day)     Assistance Recommended at Discharge Frequent or constant Supervision/Assistance  Patient can return home with the following A lot of help with walking and/or transfers   Equipment Recommendations  None recommended by PT    Recommendations for Other Services       Precautions / Restrictions Precautions Precautions: Fall;Knee Precaution Booklet Issued: Yes (comment) Restrictions Weight Bearing Restrictions: Yes RLE Weight Bearing: Weight bearing as tolerated     Mobility  Bed Mobility Overal bed mobility: Needs Assistance Bed  Mobility: Sit to Supine     Supine to sit: Mod assist Sit to supine: Mod assist   General bed mobility comments: needs heavy assist for B LEs and multiple frequent cues for sequencing. Bed in trend position to assist with repositioning    Transfers Overall transfer level: Needs assistance Equipment used: Rolling walker (2 wheels) Transfers: Sit to/from Stand Sit to Stand: Max assist           General transfer comment: multiple attempts to stand from recliner with poor effort given from patient. Having difficulty following commands with poor recall. once standing, needs specific cues for sequencing.    Ambulation/Gait Ambulation/Gait assistance: Mod assist Gait Distance (Feet): 5 Feet Assistive device: Rolling walker (2 wheels) Gait Pattern/deviations: Step-to pattern       General Gait Details: initially willing to ambulate in room, however quick fatigue. Ambulated back to bed with uncontrolled decent as her knees buckled.   Stairs             Wheelchair Mobility    Modified Rankin (Stroke Patients Only)       Balance Overall balance assessment: Needs assistance Sitting-balance support: Feet supported Sitting balance-Leahy Scale: Good     Standing balance support: Bilateral upper extremity supported Standing balance-Leahy Scale: Poor                              Cognition Arousal/Alertness: Lethargic Behavior During Therapy: WFL for tasks assessed/performed Overall Cognitive Status: Within Functional Limits for tasks assessed  General Comments: crying with movement. Very forgetful and has difficulty intiating tasks        Exercises Total Joint Exercises Goniometric ROM: R knee 8-76 degrees, pain limited Other Exercises Other Exercises: seated ther-ex performed on R LE including AP, quad sets, SLRs, and hip abd/add. 15 reps with heavy cues for sequencing, max assist Other Exercises:  ambulated to [facility] with min assist and needs cues for hygiene    General Comments        Pertinent Vitals/Pain Pain Assessment Pain Assessment: 0-10 Pain Score: 8  Pain Location: R knee Pain Descriptors / Indicators: Grimacing, Sore, Guarding Pain Intervention(s): Limited activity within patient's tolerance, Ice applied    Home Living                          Prior Function            PT Goals (current goals can now be found in the care plan section) Acute Rehab PT Goals Patient Stated Goal: to go to SNF PT Goal Formulation: With patient Time For Goal Achievement: 07/23/21 Potential to Achieve Goals: Good Progress towards PT goals: Progressing toward goals    Frequency    BID      PT Plan Current plan remains appropriate    Co-evaluation              AM-PAC PT "6 Clicks" Mobility   Outcome Measure  Help needed turning from your back to your side while in a flat bed without using bedrails?: A Lot Help needed moving from lying on your back to sitting on the side of a flat bed without using bedrails?: A Lot Help needed moving to and from a bed to a chair (including a wheelchair)?: A Lot Help needed standing up from a chair using your arms (e.g., wheelchair or bedside chair)?: A Lot Help needed to walk in hospital room?: Total Help needed climbing 3-5 steps with a railing? : Total 6 Click Score: 10    End of Session Equipment Utilized During Treatment: Gait belt Activity Tolerance: Patient limited by pain Patient left: in bed;with call bell/phone within reach;with bed alarm set Nurse Communication: Mobility status PT Visit Diagnosis: Muscle weakness (generalized) (M62.81);Difficulty in walking, not elsewhere classified (R26.2);Pain Pain - Right/Left: Right Pain - part of body: Knee     Time: 4259-5638 PT Time Calculation (min) (ACUTE ONLY): 38 min  Charges:  $Gait Training: 8-22 mins $Therapeutic Exercise: 8-22 mins $Therapeutic Activity:  8-22 mins                     Greggory Stallion, PT, DPT, GCS [phone]    Meria Crilly 07/11/2021, 4:27 PM

## 2021-07-11 NOTE — Progress Notes (Addendum)
Physical Therapy Treatment Patient Details Name: Wendy Murray MRN: [id number] DOB: 11-21-1935 Today's Date: 07/11/2021   History of Present Illness Pt admitted for R TKR. HIstory of asthma, CHF, GERD, HLD, and IBS. Pt is POD 0 at time of evaluation.    PT Comments    Pt is making good progress towards goals with ability to ambulate slightly further in room this date. Still very limited by pain and has trouble with recall of sequencing during mobility tasks. Needs prompting to participate. All mobility performed on RA with sats at 92% with exertion and HR at 115bpm. Left on RA. Gradual progress with AAROM. Will continue to progress as able.  Recommendations for follow up therapy are one component of a multi-disciplinary discharge planning process, led by the attending physician.  Recommendations may be updated based on patient status, additional functional criteria and insurance authorization.  Follow Up Recommendations  Skilled nursing-short term rehab (<3 hours/day)     Assistance Recommended at Discharge Frequent or constant Supervision/Assistance  Patient can return home with the following A lot of help with walking and/or transfers   Equipment Recommendations  None recommended by PT    Recommendations for Other Services       Precautions / Restrictions Precautions Precautions: Fall;Knee Precaution Booklet Issued: Yes (comment) Restrictions Weight Bearing Restrictions: Yes RLE Weight Bearing: Weight bearing as tolerated     Mobility  Bed Mobility Overal bed mobility: Needs Assistance Bed Mobility: Supine to Sit     Supine to sit: Mod assist     General bed mobility comments: takes extended time to perform and often needs frequent cues and direction. Once seated at EOB, able to sit with upright posture    Transfers Overall transfer level: Needs assistance Equipment used: Rolling walker (2 wheels) Transfers: Sit to/from Stand Sit to Stand: Mod assist            General transfer comment: still requires significant assist for cues for hand placement and sequencing. Once standing, limited Wbing on surgical leg    Ambulation/Gait Ambulation/Gait assistance: Min assist Gait Distance (Feet): 15 Feet Assistive device: Rolling walker (2 wheels) Gait Pattern/deviations: Step-to pattern       General Gait Details: heavy cues for sequencing, however able to ambulate slightly further distance this date. Slow step to and guarded technique using RW. Fatigues quickly   Marine scientist Rankin (Stroke Patients Only)       Balance Overall balance assessment: Needs assistance Sitting-balance support: Feet supported Sitting balance-Leahy Scale: Good     Standing balance support: Bilateral upper extremity supported Standing balance-Leahy Scale: Fair                              Cognition Arousal/Alertness: Awake/alert Behavior During Therapy: WFL for tasks assessed/performed Overall Cognitive Status: Within Functional Limits for tasks assessed                                 General Comments: emotional throughout session        Exercises Total Joint Exercises Goniometric ROM: R knee 8-76 degrees, pain limited Other Exercises Other Exercises: supine ther-ex performed on R LE including AP, quad sets, SLRs, hip abd/add, and LAQ. 15 reps with heavy cues for sequencing Other Exercises: ambulated to [facility] with min assist  and needs cues for hygiene    General Comments        Pertinent Vitals/Pain Pain Assessment Pain Assessment: 0-10 Pain Score: 7  Pain Location: R knee Pain Descriptors / Indicators: Grimacing, Sore, Guarding Pain Intervention(s): Limited activity within patient's tolerance, Repositioned, Ice applied, RN gave pain meds during session    Home Living                          Prior Function            PT Goals (current goals can now be  found in the care plan section) Acute Rehab PT Goals Patient Stated Goal: to go to SNF PT Goal Formulation: With patient Time For Goal Achievement: 07/23/21 Potential to Achieve Goals: Good Progress towards PT goals: Progressing toward goals    Frequency    BID      PT Plan Current plan remains appropriate    Co-evaluation              AM-PAC PT "6 Clicks" Mobility   Outcome Measure  Help needed turning from your back to your side while in a flat bed without using bedrails?: A Lot Help needed moving from lying on your back to sitting on the side of a flat bed without using bedrails?: A Lot Help needed moving to and from a bed to a chair (including a wheelchair)?: A Lot Help needed standing up from a chair using your arms (e.g., wheelchair or bedside chair)?: A Lot Help needed to walk in hospital room?: Total Help needed climbing 3-5 steps with a railing? : Total 6 Click Score: 10    End of Session Equipment Utilized During Treatment: Gait belt Activity Tolerance: Patient limited by pain Patient left: in chair;with chair alarm set Nurse Communication: Mobility status PT Visit Diagnosis: Muscle weakness (generalized) (M62.81);Difficulty in walking, not elsewhere classified (R26.2);Pain Pain - Right/Left: Right Pain - part of body: Knee     Time: 0925-1010 PT Time Calculation (min) (ACUTE ONLY): 45 min  Charges:  $Gait Training: 8-22 mins $Therapeutic Exercise: 8-22 mins $Therapeutic Activity: 8-22 mins                     Greggory Stallion, PT, DPT, GCS [phone]    Stuart Mirabile 07/11/2021, 11:05 AM

## 2021-07-12 DIAGNOSIS — K589 Irritable bowel syndrome without diarrhea: Secondary | ICD-10-CM | POA: Diagnosis not present

## 2021-07-12 DIAGNOSIS — R269 Unspecified abnormalities of gait and mobility: Secondary | ICD-10-CM | POA: Insufficient documentation

## 2021-07-12 DIAGNOSIS — K219 Gastro-esophageal reflux disease without esophagitis: Secondary | ICD-10-CM | POA: Diagnosis not present

## 2021-07-12 DIAGNOSIS — E039 Hypothyroidism, unspecified: Secondary | ICD-10-CM | POA: Diagnosis not present

## 2021-07-12 DIAGNOSIS — I5022 Chronic systolic (congestive) heart failure: Secondary | ICD-10-CM | POA: Diagnosis not present

## 2021-07-12 DIAGNOSIS — M6281 Muscle weakness (generalized): Secondary | ICD-10-CM | POA: Insufficient documentation

## 2021-07-12 DIAGNOSIS — R278 Other lack of coordination: Secondary | ICD-10-CM | POA: Insufficient documentation

## 2021-07-12 DIAGNOSIS — R5381 Other malaise: Secondary | ICD-10-CM | POA: Diagnosis not present

## 2021-07-12 DIAGNOSIS — Z96651 Presence of right artificial knee joint: Secondary | ICD-10-CM | POA: Diagnosis not present

## 2021-07-12 DIAGNOSIS — F32A Depression, unspecified: Secondary | ICD-10-CM | POA: Diagnosis not present

## 2021-07-12 DIAGNOSIS — I5032 Chronic diastolic (congestive) heart failure: Secondary | ICD-10-CM | POA: Diagnosis not present

## 2021-07-12 DIAGNOSIS — Z7401 Bed confinement status: Secondary | ICD-10-CM | POA: Diagnosis not present

## 2021-07-12 DIAGNOSIS — F39 Unspecified mood [affective] disorder: Secondary | ICD-10-CM | POA: Diagnosis not present

## 2021-07-12 DIAGNOSIS — E785 Hyperlipidemia, unspecified: Secondary | ICD-10-CM | POA: Diagnosis not present

## 2021-07-12 DIAGNOSIS — R531 Weakness: Secondary | ICD-10-CM | POA: Diagnosis not present

## 2021-07-12 DIAGNOSIS — M1711 Unilateral primary osteoarthritis, right knee: Secondary | ICD-10-CM | POA: Diagnosis not present

## 2021-07-12 DIAGNOSIS — R2689 Other abnormalities of gait and mobility: Secondary | ICD-10-CM | POA: Insufficient documentation

## 2021-07-12 DIAGNOSIS — Z741 Need for assistance with personal care: Secondary | ICD-10-CM | POA: Insufficient documentation

## 2021-07-12 DIAGNOSIS — Z471 Aftercare following joint replacement surgery: Secondary | ICD-10-CM | POA: Diagnosis not present

## 2021-07-12 DIAGNOSIS — R4189 Other symptoms and signs involving cognitive functions and awareness: Secondary | ICD-10-CM | POA: Diagnosis not present

## 2021-07-12 LAB — CBC
HCT: 33.7 % — ABNORMAL LOW (ref 36.0–46.0)
Hemoglobin: 10.6 g/dL — ABNORMAL LOW (ref 12.0–15.0)
MCH: 27.2 pg (ref 26.0–34.0)
MCHC: 31.5 g/dL (ref 30.0–36.0)
MCV: 86.4 fL (ref 80.0–100.0)
Platelets: 336 10*3/uL (ref 150–400)
RBC: 3.9 MIL/uL (ref 3.87–5.11)
RDW: 15.2 % (ref 11.5–15.5)
WBC: 15.2 10*3/uL — ABNORMAL HIGH (ref 4.0–10.5)
nRBC: 0 % (ref 0.0–0.2)

## 2021-07-12 LAB — BASIC METABOLIC PANEL
Anion gap: 7 (ref 5–15)
BUN: 13 mg/dL (ref 8–23)
CO2: 28 mmol/L (ref 22–32)
Calcium: 9.6 mg/dL (ref 8.9–10.3)
Chloride: 97 mmol/L — ABNORMAL LOW (ref 98–111)
Creatinine, Ser: 0.81 mg/dL (ref 0.44–1.00)
GFR, Estimated: >60 mL/min (ref >60)
Glucose, Bld: 136 mg/dL — ABNORMAL HIGH (ref 70–99)
Potassium: 3.8 mmol/L (ref 3.5–5.1)
Sodium: 132 mmol/L — ABNORMAL LOW (ref 135–145)

## 2021-07-12 LAB — URINE CULTURE: Culture: <10000 — AB

## 2021-07-12 MED ORDER — ACETAMINOPHEN 325 MG PO TABS: 325.0000 mg | ORAL_TABLET | Freq: Four times a day (QID) | ORAL | 0 refills | Status: DC | PRN

## 2021-07-12 MED ORDER — OXYCODONE HCL 5 MG PO TABS
2.5000 mg | ORAL_TABLET | ORAL | Status: DC | PRN
  Administered 2021-07-12 (×2): 5 mg via ORAL
  Filled 2021-07-12 (×2): qty 1

## 2021-07-12 MED ORDER — LIDOCAINE 5 % EX PTCH: 1.0000 | MEDICATED_PATCH | CUTANEOUS | 0 refills | Status: DC

## 2021-07-12 MED ORDER — OXYCODONE HCL 5 MG PO TABS: 2.5000 mg | ORAL_TABLET | ORAL | 0 refills | Status: DC | PRN

## 2021-07-12 MED ORDER — ONDANSETRON HCL 4 MG PO TABS: 4.0000 mg | ORAL_TABLET | Freq: Four times a day (QID) | ORAL | 0 refills | Status: DC | PRN

## 2021-07-12 MED ORDER — APIXABAN 2.5 MG PO TABS: 2.5000 mg | ORAL_TABLET | Freq: Two times a day (BID) | ORAL | 0 refills | Status: DC

## 2021-07-12 MED ORDER — ACETAMINOPHEN 500 MG PO TABS
500.0000 mg | ORAL_TABLET | Freq: Four times a day (QID) | ORAL | 0 refills | Status: DC | PRN
Start: 2021-07-12 — End: 2021-09-06

## 2021-07-12 MED ORDER — ACETAMINOPHEN 500 MG PO TABS
500.0000 mg | ORAL_TABLET | Freq: Four times a day (QID) | ORAL | 0 refills | Status: DC | PRN
Start: 2021-07-12 — End: 2021-07-12

## 2021-07-12 MED ORDER — TIZANIDINE HCL 4 MG PO TABS: 4.0000 mg | ORAL_TABLET | Freq: Three times a day (TID) | ORAL | 1 refills | Status: DC | PRN

## 2021-07-12 MED ORDER — LIDOCAINE 5 % EX PTCH
1.0000 | MEDICATED_PATCH | CUTANEOUS | Status: DC
  Administered 2021-07-12: 1 via TRANSDERMAL
  Filled 2021-07-12: qty 1

## 2021-07-12 NOTE — Progress Notes (Signed)
Physical Therapy Treatment Patient Details Name: Wendy Murray MRN: [id number] DOB: 01-Sep-1935 Today's Date: 07/12/2021   History of Present Illness Pt admitted for R TKR. HIstory of asthma, CHF, GERD, HLD, and IBS. Pt is POD 0 at time of evaluation.    PT Comments    Pt transferring to [facility] with nursing upon arrival. Pt had received pain meds 45 minutes prior to arrival and continues to c/o 8/10 pain R knee. Dilaudid given via IV for return to bed mobility, yet c/c of pain with limited weight acceptance and ability to maneuver R LE. Pt continues to require MaxA for transfers, repeated single step cues for task completion, and slow progression with gait at this time due to pain. Pt will benefit from short term rehab once medically stable. R knee AAROM in sitting 5-80 degrees.  Continue to progress as tolerated.   Recommendations for follow up therapy are one component of a multi-disciplinary discharge planning process, led by the attending physician.  Recommendations may be updated based on patient status, additional functional criteria and insurance authorization.  Follow Up Recommendations  Skilled nursing-short term rehab (<3 hours/day)     Assistance Recommended at Discharge Frequent or constant Supervision/Assistance  Patient can return home with the following A lot of help with walking and/or transfers   Equipment Recommendations  None recommended by PT    Recommendations for Other Services       Precautions / Restrictions Precautions Precautions: Fall;Knee Precaution Booklet Issued: Yes (comment) Precaution Comments:  (pt educated on not placing pillow under R knee) Restrictions Weight Bearing Restrictions: Yes RLE Weight Bearing: Weight bearing as tolerated     Mobility  Bed Mobility Overal bed mobility: Needs Assistance Bed Mobility: Sit to Supine     Supine to sit: Mod assist Sit to supine: Mod assist   General bed mobility comments: needs heavy assist for  B LEs and multiple frequent cues for sequencing. Bed in trend position to assist with repositioning    Transfers Overall transfer level: Needs assistance Equipment used: Rolling walker (2 wheels) Transfers: Sit to/from Stand Sit to Stand: Max assist           General transfer comment:  (Pt required multiple attempts to stand and attain upright stance.)    Ambulation/Gait Ambulation/Gait assistance: Min assist (and assist to maneuver RW) Gait Distance (Feet): 3 Feet Assistive device: Rolling walker (2 wheels) Gait Pattern/deviations: Step-to pattern       General Gait Details:  (poor tolerance for weight acceptance and active movement R LE)   Stairs             Wheelchair Mobility    Modified Rankin (Stroke Patients Only)       Balance Overall balance assessment: Needs assistance Sitting-balance support: Feet supported Sitting balance-Leahy Scale: Good Sitting balance - Comments: Steady staitic sitting, reaching within BOS.   Standing balance support: Bilateral upper extremity supported Standing balance-Leahy Scale: Poor Standing balance comment:  (ModA to attain midline balance)                            Cognition Arousal/Alertness: Lethargic Behavior During Therapy: WFL for tasks assessed/performed Overall Cognitive Status: Within Functional Limits for tasks assessed                                 General Comments:  (Pt required repeated vc's to complete tasks)  Exercises Total Joint Exercises Ankle Circles/Pumps: AROM, Both, 20 reps Quad Sets: AROM, Both, 10 reps (repeated cues for proper technique) Gluteal Sets: AROM, Both, 10 reps Heel Slides: AAROM, Right, 10 reps Knee Flexion: AAROM, Seated Goniometric ROM: 5-80    General Comments General comments (skin integrity, edema, etc.): poor tolerance for mobility and ROM exercises, difficulty following commands for isometric exercises      Pertinent  Vitals/Pain Pain Assessment Pain Assessment: 0-10 Pain Score: 8  Pain Location: R knee Pain Descriptors / Indicators: Grimacing, Sore, Guarding Pain Intervention(s): Monitored during session, Premedicated before session, Patient requesting pain meds-RN notified (Dilaudid given via IV during session)    Home Living                          Prior Function            PT Goals (current goals can now be found in the care plan section) Acute Rehab PT Goals Patient Stated Goal: to go to SNF    Frequency    BID      PT Plan Current plan remains appropriate    Co-evaluation              AM-PAC PT "6 Clicks" Mobility   Outcome Measure  Help needed turning from your back to your side while in a flat bed without using bedrails?: A Lot Help needed moving from lying on your back to sitting on the side of a flat bed without using bedrails?: A Lot Help needed moving to and from a bed to a chair (including a wheelchair)?: A Lot Help needed standing up from a chair using your arms (e.g., wheelchair or bedside chair)?: A Lot Help needed to walk in hospital room?: Total Help needed climbing 3-5 steps with a railing? : Total 6 Click Score: 10    End of Session Equipment Utilized During Treatment: Gait belt Activity Tolerance: Patient limited by pain Patient left: in bed;with call bell/phone within reach;with bed alarm set;with SCD's reapplied Nurse Communication: Mobility status PT Visit Diagnosis: Muscle weakness (generalized) (M62.81);Difficulty in walking, not elsewhere classified (R26.2);Pain Pain - Right/Left: Right Pain - part of body: Knee     Time: 0017-4944 PT Time Calculation (min) (ACUTE ONLY): 40 min  Charges:  $Gait Training: 8-22 mins $Therapeutic Exercise: 8-22 mins $Therapeutic Activity: 8-22 mins                    Mikel Cella, PTA    Wendy Murray 07/12/2021, 12:05 PM

## 2021-07-12 NOTE — Discharge Instructions (Signed)
Diet: As you were doing prior to hospitalization   Shower:  May shower but keep the wounds dry, use an occlusive plastic wrap, NO SOAKING IN TUB.  If the bandage gets wet, change with a clean dry gauze.  Dressing:  You may change your dressing as needed. Change the dressing with sterile gauze dressing.    Activity:  Increase activity slowly as tolerated, but follow the weight bearing instructions below.  No lifting or driving for 6 weeks.  Weight Bearing:   Weight bearing as tolerated to right lower extremity  Blood Clot Prevention: Take 2.5mg  Eliquis twice daily for 15 days.  To prevent constipation: you may use a stool softener such as -  Colace (over the counter) 100 mg by mouth twice a day  Drink plenty of fluids (prune juice may be helpful) and high fiber foods Miralax (over the counter) for constipation as needed.    Itching:  If you experience itching with your medications, try taking only a single pain pill, or even half a pain pill at a time.  You may take up to 10 pain pills per day, and you can also use benadryl over the counter for itching or also to help with sleep.   Precautions:  If you experience chest pain or shortness of breath - call 911 immediately for transfer to the hospital emergency department!!  If you develop a fever greater that 101 F, purulent drainage from wound, increased redness or drainage from wound, or calf pain-Call [facility] [place]                                              Follow- Up Appointment:  Please call for an appointment to be seen in 2 weeks at [facility]

## 2021-07-12 NOTE — Care Management Important Message (Signed)
Important Message  Patient Details  Name: Wendy Murray MRN: [id number] Date of Birth: 11/29/1935   Medicare Important Message Given:  Yes     Juliann Pulse A Ritaj Dullea 07/12/2021, 9:59 AM

## 2021-07-12 NOTE — Plan of Care (Signed)
Patient discharged per MD orders at this time.All discharge instructions,education and medications reviewed with the patient.Patient expressed understanding and will comply with dc instructions.follow up appointments was also communicated to the patient.no verbal c/o or any ssx of distress.Patient was discharged to the [facility] SNF for PT/OT services per order.report was called in to [facility] before transport.Pt was transported by two EMS personnel on a stretcher.

## 2021-07-12 NOTE — TOC Progression Note (Signed)
Transition of Care [facility]) - Progression Note    Patient Details  Name: Wendy Murray MRN: [id number] Date of Birth: [birth year]  Transition of Care [facility]) CM/SW [place], RN Phone Number: 07/12/2021, 10:43 AM  Clinical Narrative:    Patient to dc to [facility] room 104. She is alert and oriented and cal call her family to let them know, EMS will transport, the bedside nurse to call report        Expected Discharge Plan and Services           Expected Discharge Date: 07/12/21                                     Social Determinants of Health (SDOH) Interventions    Readmission Risk Interventions No flowsheet data found.

## 2021-07-12 NOTE — Progress Notes (Signed)
Subjective: 3 Days Post-Op Procedure(s) (LRB): TOTAL KNEE ARTHROPLASTY (Right) Patient reports pain as mild this morning. Patient very somnolent this morning, appears she received oxycodone at 4 this morning. Patient is well, currently on room air 92% Patient currently lives at [facility], plan is to go to SNF at [facility]. Plan for discharge home today. Negative for chest pain and shortness of breath Fever: no Gastrointestinal:Negative for nausea and vomiting She has had a BM.  Objective: Vital signs in last 24 hours: Temp:  [97.5 F (36.4 C)-99 F (37.2 C)] 99 F (37.2 C) (01/27 0434) Pulse Rate:  [95-106] 98 (01/27 0434) Resp:  [16-17] 17 (01/27 0434) BP: (138-149)/(65-91) 139/84 (01/27 0434) SpO2:  [92 %-100 %] 92 % (01/27 0434)  Intake/Output from previous day:  Intake/Output Summary (Last 24 hours) at 07/12/2021 0740 Last data filed at 07/11/2021 1115 Gross per 24 hour  Intake 240 ml  Output 700 ml  Net -460 ml    Intake/Output this shift: No intake/output data recorded.  Labs: Recent Labs    07/10/21 0723 07/11/21 0626 07/12/21 0632  HGB 11.8* 10.9* 10.6*   Recent Labs    07/11/21 0626 07/12/21 0632  WBC 16.3* 15.2*  RBC 3.83* 3.90  HCT 32.2* 33.7*  PLT 316 336   Recent Labs    07/11/21 0626 07/12/21 0632  NA 129* 132*  K 3.5 3.8  CL 96* 97*  CO2 26 28  BUN 8 13  CREATININE 0.62 0.81  GLUCOSE 136* 136*  CALCIUM 9.6 9.6   No results for input(s): LABPT, INR in the last 72 hours.   EXAM General - Patient is Alert, Appropriate, and Oriented.  Patient is very drowsy, will fall asleep mid-conversation but responds appropriately to all questions. Extremity - ABD soft Neurovascular intact Dorsiflexion/Plantar flexion intact Incision: scant drainage No cellulitis present Compartment soft Dressing/Incision - Mild blood tinged drainage noted to the right knee Honeycomb dressing. Motor Function - intact, moving foot and toes well on exam.   Abdomen soft with intact bowel sounds this morning. SCDs applied to bilateral lower extremities.  Past Medical History:  Diagnosis Date   CHF (congestive heart failure) ([place]) 2595   diastolic   GLOVF-64 33/29/5188   Diverticulosis    DVT (deep venous thrombosis) ([place]) 06/16/2010   after TKR   Hyperlipidemia    Hyperparathyroidism ([place])    Hypothyroidism    IBS (irritable bowel syndrome)    Inflammatory arthritis    Osteoarthritis, knee    Recurrent major depression ([facility])    TIA (transient ischemic attack) 01/2021   Assessment/Plan: 3 Days Post-Op Procedure(s) (LRB): TOTAL KNEE ARTHROPLASTY (Right) Principal Problem:   Status post total knee replacement using cement, right  Estimated body mass index is 30.29 kg/m as calculated from the following:   Height as of this encounter: 5\' 5"  (1.651 m).   Weight as of this encounter: 82.6 kg. Advance diet Up with therapy D/C IV fluids when tolerating po intake.  Labs reviewed this morning. Na improving, 132.   WBC trending down to 15.2.  CXR negative for pneumonia, no signs of UTI on urinalysis, will send for culture. Continue with incentive spirometer.  Currently on room air. Patient very drowsy this morning, responds appropriately.  Monitor narcotic pain medication.  Patient does still report moderate pain with therapy but don't want to increase pain medications at this time. Up with therapy today, plan is for d/c to SNF at [facility] later today. Apply TED hose stockings to bilateral  lower extremities prior to discharge.  DVT Prophylaxis - TED hose and Eliquis Weight-Bearing as tolerated to right leg  J. Cameron Proud, PA-C [facility] 07/12/2021, 7:40 AM

## 2021-07-12 NOTE — Discharge Summary (Addendum)
Physician Discharge Summary  Patient ID: Wendy Murray MRN: [id number] DOB/AGE: September 18, 1935 86 y.o.  Admit date: 07/09/2021 Discharge date: 07/12/2021  Admission Diagnoses:  Status post total knee replacement using cement, right [Z96.651]  Discharge Diagnoses: Patient Active Problem List   Diagnosis Date Noted   Status post total knee replacement using cement, right 07/09/2021   Urinary frequency 04/08/2021   Asthmatic bronchitis    Chronic diastolic CHF (congestive heart failure) ([place]) 03/23/2021   Neck pain 03/12/2021   Osteoarthritis of right knee 03/12/2021   TIA (transient ischemic attack) 01/23/2021   GERD (gastroesophageal reflux disease) 12/16/2016   Hyperparathyroidism ([place])    Advance directive discussed with patient 12/13/2014   Routine general medical examination at a health care facility 12/12/2013   Hyperlipidemia    Recurrent major depression ([place])    Hypothyroidism    Osteoarthritis, knee    IBS (irritable bowel syndrome)     Past Medical History:  Diagnosis Date   CHF (congestive heart failure) ([place]) 9833   diastolic   ASNKN-39 76/73/4193   Diverticulosis    DVT (deep venous thrombosis) ([place]) 06/16/2010   after TKR   Hyperlipidemia    Hyperparathyroidism ([place])    Hypothyroidism    IBS (irritable bowel syndrome)    Inflammatory arthritis    Osteoarthritis, knee    Recurrent major depression ([place])    TIA (transient ischemic attack) 01/2021   Transfusion: None.   Consultants (if any):   Discharged Condition: Improved  Hospital Course: Wendy Murray is an 86 y.o. female who was admitted 07/09/2021 with a diagnosis of degenerative joint disease of the right knee and went to the operating room on 07/09/2021 and underwent the above named procedures.   Patient did develop low grade fever, increase WBC on POD1 and POD2.  Chest x-rays negative for pneumonia, urinalysis was negative for signs of UTI.  WBC trending down to 15.2 on POD3.  Incentive  spirometer use encouraged.   Surgeries: Procedure(s): TOTAL KNEE ARTHROPLASTY on 07/09/2021 Patient tolerated the surgery well. Taken to PACU where she was stabilized and then transferred to the orthopedic floor.  Started on Eliquis 2.5mg  twice daily for DVT prophylaxis. Foot pumps applied bilaterally at 80 mm. Heels elevated on bed with rolled towels. No evidence of DVT. Negative [place]. Physical therapy started on day #1 for gait training and transfer. OT started day #1 for ADL and assisted devices.  Patient's was removed on POD3.  Implants: Right TKA using all-cemented [employer] Vanguard system with a 67.5 mm mm PCR femur, a 71 mm tibial tray with a 12 mm anterior stabilized E-poly insert, and a 31 x 6.2 mm all-poly 3-pegged domed patella.  She was given perioperative antibiotics:  Anti-infectives (From admission, onward)    Start     Dose/Rate Route Frequency Ordered Stop   07/09/21 1400  ceFAZolin (ANCEF) IVPB 2g/100 mL premix        2 g 200 mL/hr over 30 Minutes Intravenous Every 6 hours 07/09/21 1248 07/10/21 0227   07/09/21 0635  ceFAZolin (ANCEF) 2-4 GM/100ML-% IVPB       Note to Pharmacy: Herby Abraham W: cabinet override      07/09/21 0635 07/09/21 0758   07/09/21 0600  ceFAZolin (ANCEF) IVPB 2g/100 mL premix        2 g 200 mL/hr over 30 Minutes Intravenous On call to O.R. 07/09/21 7902 07/09/21 0751     .  She was given sequential compression devices, early ambulation, and Eliquis for DVT  prophylaxis.  She benefited maximally from the hospital stay and there were no complications.    Recent vital signs:  Vitals:   07/12/21 1125 07/12/21 1604  BP: 132/68 (!) 141/69  Pulse: (!) 101 92  Resp: 18 18  Temp: 98.3 F (36.8 C) 98.8 F (37.1 C)  SpO2: 91% 91%    Recent laboratory studies:  Lab Results  Component Value Date   HGB 10.6 (L) 07/12/2021   HGB 10.9 (L) 07/11/2021   HGB 11.8 (L) 07/10/2021   Lab Results  Component Value Date   WBC 15.2 (H) 07/12/2021    PLT 336 07/12/2021   Lab Results  Component Value Date   INR 1.0 03/23/2021   Lab Results  Component Value Date   NA 132 (L) 07/12/2021   K 3.8 07/12/2021   CL 97 (L) 07/12/2021   CO2 28 07/12/2021   BUN 13 07/12/2021   CREATININE 0.81 07/12/2021   GLUCOSE 136 (H) 07/12/2021    Discharge Medications:   Allergies as of 07/12/2021   No Known Allergies      Medication List     STOP taking these medications    aspirin 81 MG EC tablet       TAKE these medications    acetaminophen 500 MG tablet Commonly known as: TYLENOL Take 1-2 tablets (500-1,000 mg total) by mouth every 6 (six) hours as needed.   albuterol 108 (90 Base) MCG/ACT inhaler Commonly known as: VENTOLIN HFA Inhale 2 puffs into the lungs every 6 (six) hours as needed for wheezing or shortness of breath.   Align 4 MG Caps Take 1 capsule by mouth at bedtime.   apixaban 2.5 MG Tabs tablet Commonly known as: ELIQUIS Take 1 tablet (2.5 mg total) by mouth 2 (two) times daily.   atorvastatin 40 MG tablet Commonly known as: LIPITOR Take 1 tablet (40 mg total) by mouth daily. What changed: when to take this   celecoxib 200 MG capsule Commonly known as: CELEBREX Take 200 mg by mouth daily.   clonazePAM 1 MG tablet Commonly known as: KLONOPIN TAKE ONE TABLET BY MOUTH EVERY NIGHT AT BEDTIME   ELDERBERRY PO Take 1 each by mouth daily.   Iron 142 (45 Fe) MG Tbcr Take 142 mg by mouth daily.   levothyroxine 75 MCG tablet Commonly known as: SYNTHROID TAKE ONE TABLET BY MOUTH DAILY BEFORE BREAKFAST   lidocaine 5 % Commonly known as: LIDODERM Place 1 patch onto the skin daily. Remove & Discard patch within 12 hours or as directed by MD.  Can apply to the right knee however not over the incision site.   mirtazapine 15 MG tablet Commonly known as: REMERON Take 1 tablet (15 mg total) by mouth at bedtime.   multivitamin with minerals tablet Take 2 tablets by mouth daily. Gummies   omeprazole 20 MG  capsule Commonly known as: PRILOSEC Take 20 mg by mouth every evening.   ondansetron 4 MG tablet Commonly known as: ZOFRAN Take 1 tablet (4 mg total) by mouth every 6 (six) hours as needed for nausea.   oxyCODONE 5 MG immediate release tablet Commonly known as: Roxicodone Take 0.5-1 tablets (2.5-5 mg total) by mouth every 4 (four) hours as needed for severe pain.   tiZANidine 4 MG tablet Commonly known as: ZANAFLEX Take 1 tablet (4 mg total) by mouth every 8 (eight) hours as needed for muscle spasms. What changed:  how much to take when to take this   venlafaxine XR 150 MG 24  hr capsule Commonly known as: EFFEXOR-XR Take 1 capsule (150 mg total) by mouth daily. Pt takes with a 75mg  capsule.   venlafaxine XR 75 MG 24 hr capsule Commonly known as: EFFEXOR-XR Take 1 capsule (75 mg total) by mouth daily. Pt takes with a 150mg  capsule.   Vitamin D3 125 MCG (5000 UT) Caps Take 5,000 Units by mouth daily.        Diagnostic Studies: DG Chest Port 1 View  Result Date: 07/11/2021 CLINICAL DATA:  Fever.  Ex-smoker. EXAM: PORTABLE CHEST 1 VIEW COMPARISON:  03/23/2021 FINDINGS: Poor inspiration. Normal sized heart. Tortuous and partially calcified thoracic aorta. Crowding of the lung markings with grossly stable diffuse interstitial prominence mild peribronchial thickening. No acute bony abnormality. IMPRESSION: Stable mild chronic bronchitic changes with no acute abnormality seen. Electronically Signed   By: Claudie Revering M.D.   On: 07/11/2021 13:22   DG Knee Right Port  Result Date: 07/09/2021 CLINICAL DATA:  Status post right knee arthroplasty EXAM: PORTABLE RIGHT KNEE - 1-2 VIEW COMPARISON:  06/12/2015 FINDINGS: There is interval right knee arthroplasty. There are pockets of air in the soft tissues. Skin staples are noted in the anterior aspect. No fracture lines are seen. IMPRESSION: Status post right knee arthroplasty. Electronically Signed   By: Elmer Picker M.D.   On:  07/09/2021 12:02    Disposition: Discharge disposition: 03-Skilled [place] for discharge to [facility] today.     Contact information for follow-up providers     Lattie Corns, PA-C Follow up in 14 day(s).   Specialty: Physician Assistant Why: Electa Sniff information: [place]  [id number] [phone]              Contact information for after-discharge care     Destination     [facility] .   Service: Skilled Nursing Contact information: [place] [phone]                    Signed: Judson Roch PA-C 07/12/2021, 5:31 PM

## 2021-07-13 ENCOUNTER — Encounter: Payer: Self-pay | Admitting: Internal Medicine

## 2021-07-15 DIAGNOSIS — F39 Unspecified mood [affective] disorder: Secondary | ICD-10-CM | POA: Diagnosis not present

## 2021-07-15 DIAGNOSIS — I5032 Chronic diastolic (congestive) heart failure: Secondary | ICD-10-CM | POA: Diagnosis not present

## 2021-07-15 DIAGNOSIS — M1711 Unilateral primary osteoarthritis, right knee: Secondary | ICD-10-CM | POA: Diagnosis not present

## 2021-07-15 DIAGNOSIS — E039 Hypothyroidism, unspecified: Secondary | ICD-10-CM | POA: Diagnosis not present

## 2021-07-16 ENCOUNTER — Encounter: Payer: Self-pay | Admitting: Internal Medicine

## 2021-07-16 DIAGNOSIS — R4189 Other symptoms and signs involving cognitive functions and awareness: Secondary | ICD-10-CM | POA: Insufficient documentation

## 2021-07-26 ENCOUNTER — Other Ambulatory Visit: Payer: Self-pay | Admitting: Internal Medicine

## 2021-07-29 DIAGNOSIS — M1711 Unilateral primary osteoarthritis, right knee: Secondary | ICD-10-CM | POA: Diagnosis not present

## 2021-07-30 DIAGNOSIS — Z96651 Presence of right artificial knee joint: Secondary | ICD-10-CM | POA: Diagnosis not present

## 2021-07-30 DIAGNOSIS — Z741 Need for assistance with personal care: Secondary | ICD-10-CM | POA: Diagnosis not present

## 2021-07-30 DIAGNOSIS — M1711 Unilateral primary osteoarthritis, right knee: Secondary | ICD-10-CM | POA: Diagnosis not present

## 2021-07-30 DIAGNOSIS — R2681 Unsteadiness on feet: Secondary | ICD-10-CM | POA: Insufficient documentation

## 2021-07-30 DIAGNOSIS — F32A Depression, unspecified: Secondary | ICD-10-CM | POA: Diagnosis not present

## 2021-07-30 DIAGNOSIS — Z471 Aftercare following joint replacement surgery: Secondary | ICD-10-CM | POA: Diagnosis not present

## 2021-07-30 DIAGNOSIS — I5022 Chronic systolic (congestive) heart failure: Secondary | ICD-10-CM | POA: Diagnosis not present

## 2021-07-30 DIAGNOSIS — R278 Other lack of coordination: Secondary | ICD-10-CM | POA: Diagnosis not present

## 2021-08-02 DIAGNOSIS — I5022 Chronic systolic (congestive) heart failure: Secondary | ICD-10-CM | POA: Diagnosis not present

## 2021-08-02 DIAGNOSIS — Z471 Aftercare following joint replacement surgery: Secondary | ICD-10-CM | POA: Diagnosis not present

## 2021-08-02 DIAGNOSIS — R2681 Unsteadiness on feet: Secondary | ICD-10-CM | POA: Diagnosis not present

## 2021-08-02 DIAGNOSIS — Z96651 Presence of right artificial knee joint: Secondary | ICD-10-CM | POA: Diagnosis not present

## 2021-08-02 DIAGNOSIS — F32A Depression, unspecified: Secondary | ICD-10-CM | POA: Diagnosis not present

## 2021-08-02 DIAGNOSIS — M1711 Unilateral primary osteoarthritis, right knee: Secondary | ICD-10-CM | POA: Diagnosis not present

## 2021-08-05 DIAGNOSIS — M1711 Unilateral primary osteoarthritis, right knee: Secondary | ICD-10-CM | POA: Diagnosis not present

## 2021-08-05 DIAGNOSIS — I5022 Chronic systolic (congestive) heart failure: Secondary | ICD-10-CM | POA: Diagnosis not present

## 2021-08-05 DIAGNOSIS — F32A Depression, unspecified: Secondary | ICD-10-CM | POA: Diagnosis not present

## 2021-08-05 DIAGNOSIS — Z471 Aftercare following joint replacement surgery: Secondary | ICD-10-CM | POA: Diagnosis not present

## 2021-08-05 DIAGNOSIS — R2681 Unsteadiness on feet: Secondary | ICD-10-CM | POA: Diagnosis not present

## 2021-08-05 DIAGNOSIS — Z96651 Presence of right artificial knee joint: Secondary | ICD-10-CM | POA: Diagnosis not present

## 2021-08-06 ENCOUNTER — Ambulatory Visit (INDEPENDENT_AMBULATORY_CARE_PROVIDER_SITE_OTHER): Payer: Medicare Other | Admitting: Internal Medicine

## 2021-08-06 ENCOUNTER — Other Ambulatory Visit: Payer: Self-pay

## 2021-08-06 ENCOUNTER — Encounter: Payer: Self-pay | Admitting: Internal Medicine

## 2021-08-06 DIAGNOSIS — Z471 Aftercare following joint replacement surgery: Secondary | ICD-10-CM | POA: Diagnosis not present

## 2021-08-06 DIAGNOSIS — F330 Major depressive disorder, recurrent, mild: Secondary | ICD-10-CM | POA: Diagnosis not present

## 2021-08-06 DIAGNOSIS — F32A Depression, unspecified: Secondary | ICD-10-CM | POA: Diagnosis not present

## 2021-08-06 DIAGNOSIS — R2681 Unsteadiness on feet: Secondary | ICD-10-CM | POA: Diagnosis not present

## 2021-08-06 DIAGNOSIS — Z96651 Presence of right artificial knee joint: Secondary | ICD-10-CM | POA: Diagnosis not present

## 2021-08-06 DIAGNOSIS — I5022 Chronic systolic (congestive) heart failure: Secondary | ICD-10-CM | POA: Diagnosis not present

## 2021-08-06 DIAGNOSIS — M1711 Unilateral primary osteoarthritis, right knee: Secondary | ICD-10-CM | POA: Diagnosis not present

## 2021-08-06 NOTE — Assessment & Plan Note (Signed)
With grieving complicating this Continue venlafaxine 225 and mirtazapine 15mg 

## 2021-08-06 NOTE — Assessment & Plan Note (Signed)
Doing well Continuing rehab and making good progress Only using tylenol

## 2021-08-06 NOTE — Progress Notes (Signed)
Subjective:    Patient ID: Wendy Murray, female    DOB: [birth year], 86 y.o.   MRN: [id number]  HPI Here for follow up of right TKR and rehab  Having a hard time ---grieving  Certain things will trigger crying, etc---like the scripture passage on calendar in here Having service this weekend---hopefully will help with closure  Doing okay at home Knee is doing well Flexion already to 90 degrees Walking with walker---practicing with cane Heritage coming to her villa--now will be going to gym soon Hasn't needed the norco---using just tylenol (and mostly for headache)  Making simple breakfast and lunch--will start with home health twice a week for 2 hours--- shopping and heavy tasks Hearth furnishing supper  Current Outpatient Medications on File Prior to Visit  Medication Sig Dispense Refill   acetaminophen (TYLENOL) 500 MG tablet Take 1-2 tablets (500-1,000 mg total) by mouth every 6 (six) hours as needed. 60 tablet 0   albuterol (VENTOLIN HFA) 108 (90 Base) MCG/ACT inhaler Inhale 2 puffs into the lungs every 6 (six) hours as needed for wheezing or shortness of breath. 8 g 2   aspirin 325 MG tablet Take 325 mg by mouth daily.     atorvastatin (LIPITOR) 40 MG tablet Take 1 tablet (40 mg total) by mouth daily. (Patient taking differently: Take 40 mg by mouth every evening.) 90 tablet 1   celecoxib (CELEBREX) 200 MG capsule Take 200 mg by mouth daily.     Cholecalciferol (VITAMIN D3) 125 MCG (5000 UT) CAPS Take 5,000 Units by mouth daily.     clonazePAM (KLONOPIN) 1 MG tablet TAKE ONE TABLET BY MOUTH EVERY NIGHT AT BEDTIME 90 tablet 0   ELDERBERRY PO Take 1 each by mouth daily.     Ferrous Sulfate (IRON) 142 (45 Fe) MG TBCR Take 142 mg by mouth daily.     levothyroxine (SYNTHROID) 75 MCG tablet TAKE ONE TABLET BY MOUTH DAILY BEFORE BREAKFAST 90 tablet 3   mirtazapine (REMERON) 15 MG tablet Take 1 tablet (15 mg total) by mouth at bedtime. 90 tablet 3   Multiple Vitamins-Minerals  (MULTIVITAMIN WITH MINERALS) tablet Take 2 tablets by mouth daily. Gummies     omeprazole (PRILOSEC) 20 MG capsule Take 20 mg by mouth every evening.     Probiotic Product (ALIGN) 4 MG CAPS Take 1 capsule by mouth at bedtime.     tiZANidine (ZANAFLEX) 4 MG tablet Take 1 tablet (4 mg total) by mouth every 8 (eight) hours as needed for muscle spasms. 60 tablet 1   venlafaxine XR (EFFEXOR-XR) 150 MG 24 hr capsule TAKE ONE CAPSULE BY MOUTH DAILY ALONG WITH 75MG  CAPSULE 90 capsule 3   venlafaxine XR (EFFEXOR-XR) 75 MG 24 hr capsule TAKE ONE CAPSULE BY MOUTH DAILY ALONG WITH 150MG  CAPSULE 90 capsule 3   oxyCODONE (ROXICODONE) 5 MG immediate release tablet Take 0.5-1 tablets (2.5-5 mg total) by mouth every 4 (four) hours as needed for severe pain. (Patient not taking: Reported on 08/06/2021) 40 tablet 0   No current facility-administered medications on file prior to visit.    No Known Allergies  Past Medical History:  Diagnosis Date   CHF (congestive heart failure) ([place]) 5176   diastolic   HYWVP-71 12/10/9483   Diverticulosis    DVT (deep venous thrombosis) ([place]) 06/16/2010   after TKR   Hyperlipidemia    Hyperparathyroidism ([facility])    Hypothyroidism    IBS (irritable bowel syndrome)    Inflammatory arthritis    Osteoarthritis, knee  Recurrent major depression ([place])    TIA (transient ischemic attack) 01/2021    Past Surgical History:  Procedure Laterality Date   ABDOMINAL HYSTERECTOMY  06/16/1989   CATARACT EXTRACTION W/ INTRAOCULAR LENS  IMPLANT, BILATERAL  06/16/2012   CHOLECYSTECTOMY  06/16/1996   COLONOSCOPY     DEXA  08/15/2007   Normal   HAMMER TOE SURGERY Left 10/15/2014   Dr Barkley Bruns   LUMBAR FUSION  06/17/1991   MENISCUS REPAIR Left 04/16/2010   TOTAL KNEE ARTHROPLASTY Left 06/16/2010   TOTAL KNEE ARTHROPLASTY Right 07/09/2021   Procedure: TOTAL KNEE ARTHROPLASTY;  Surgeon: Corky Mull, MD;  Location: [facility] ORS;  Service: Orthopedics;  Laterality: Right;    Family  History  Problem Relation Age of Onset   Cancer Mother        colon   Depression Mother    Cancer Father    Depression Sister    Hypertension Neg Hx    Heart disease Neg Hx    Diabetes Neg Hx     Social History   Socioeconomic History   Marital status: Widowed    Spouse name: Not on file   Number of children: 3   Years of education: Not on file   Highest education level: Not on file  Occupational History   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    Comment: Retired  Tobacco Use   Smoking status: Former    Types: Cigarettes    Quit date: 06/16/1981    Years since quitting: 40.1   Smokeless tobacco: Never  Vaping Use   Vaping Use: Never used  Substance and Sexual Activity   Alcohol use: Yes    Alcohol/week: 0.0 standard drinks    Comment: rarely   Drug use: No   Sexual activity: Not Currently  Other Topics Concern   Not on file  Social History Narrative   [place] in [place]   Has living will    daughter Joycelyn Schmid, have health care POA   Has DNR ---order rewritten   No feeding tube if cognitively unaware   Will be donating body to [facility]   Husband passed away 07-19-2021   Social Determinants of Health   Financial Resource Strain: Not on file  Food Insecurity: Not on file  Transportation Needs: Not on file  Physical Activity: Not on file  Stress: Not on file  Social Connections: Not on file  Intimate Partner Violence: Not on file   Review of Systems Eating okay Sleeps fine Weight is stale Concerned about cerumen----not hearing as well with aides    Objective:   Physical Exam Constitutional:      Appearance: Normal appearance.  Cardiovascular:     Rate and Rhythm: Normal rate and regular rhythm.     Heart sounds: No murmur heard.   No gallop.  Pulmonary:     Effort: Pulmonary effort is normal.     Breath sounds: Normal breath sounds. No wheezing or rales.  Musculoskeletal:     Cervical back: Neck supple.      Comments: Weight bearing fully Good ROM right knee  Lymphadenopathy:     Cervical: No cervical adenopathy.  Neurological:     Mental Status: She is alert.  Psychiatric:     Comments: Briefly tearful---then pulls it together           Assessment & Plan:

## 2021-08-08 DIAGNOSIS — F32A Depression, unspecified: Secondary | ICD-10-CM | POA: Diagnosis not present

## 2021-08-08 DIAGNOSIS — M1711 Unilateral primary osteoarthritis, right knee: Secondary | ICD-10-CM | POA: Diagnosis not present

## 2021-08-08 DIAGNOSIS — Z96651 Presence of right artificial knee joint: Secondary | ICD-10-CM | POA: Diagnosis not present

## 2021-08-08 DIAGNOSIS — I5022 Chronic systolic (congestive) heart failure: Secondary | ICD-10-CM | POA: Diagnosis not present

## 2021-08-08 DIAGNOSIS — R2681 Unsteadiness on feet: Secondary | ICD-10-CM | POA: Diagnosis not present

## 2021-08-08 DIAGNOSIS — Z471 Aftercare following joint replacement surgery: Secondary | ICD-10-CM | POA: Diagnosis not present

## 2021-08-13 DIAGNOSIS — Z96651 Presence of right artificial knee joint: Secondary | ICD-10-CM | POA: Diagnosis not present

## 2021-08-13 DIAGNOSIS — Z471 Aftercare following joint replacement surgery: Secondary | ICD-10-CM | POA: Diagnosis not present

## 2021-08-13 DIAGNOSIS — R2681 Unsteadiness on feet: Secondary | ICD-10-CM | POA: Diagnosis not present

## 2021-08-13 DIAGNOSIS — I5022 Chronic systolic (congestive) heart failure: Secondary | ICD-10-CM | POA: Diagnosis not present

## 2021-08-13 DIAGNOSIS — F32A Depression, unspecified: Secondary | ICD-10-CM | POA: Diagnosis not present

## 2021-08-13 DIAGNOSIS — M1711 Unilateral primary osteoarthritis, right knee: Secondary | ICD-10-CM | POA: Diagnosis not present

## 2021-08-14 DIAGNOSIS — M1711 Unilateral primary osteoarthritis, right knee: Secondary | ICD-10-CM | POA: Diagnosis not present

## 2021-08-14 DIAGNOSIS — Z96651 Presence of right artificial knee joint: Secondary | ICD-10-CM | POA: Diagnosis not present

## 2021-08-14 DIAGNOSIS — R2681 Unsteadiness on feet: Secondary | ICD-10-CM | POA: Diagnosis not present

## 2021-08-14 DIAGNOSIS — F32A Depression, unspecified: Secondary | ICD-10-CM | POA: Diagnosis not present

## 2021-08-14 DIAGNOSIS — I5022 Chronic systolic (congestive) heart failure: Secondary | ICD-10-CM | POA: Diagnosis not present

## 2021-08-14 DIAGNOSIS — R278 Other lack of coordination: Secondary | ICD-10-CM | POA: Diagnosis not present

## 2021-08-14 DIAGNOSIS — Z471 Aftercare following joint replacement surgery: Secondary | ICD-10-CM | POA: Diagnosis not present

## 2021-08-14 DIAGNOSIS — Z741 Need for assistance with personal care: Secondary | ICD-10-CM | POA: Diagnosis not present

## 2021-08-15 DIAGNOSIS — M1711 Unilateral primary osteoarthritis, right knee: Secondary | ICD-10-CM | POA: Diagnosis not present

## 2021-08-15 DIAGNOSIS — F32A Depression, unspecified: Secondary | ICD-10-CM | POA: Diagnosis not present

## 2021-08-15 DIAGNOSIS — R2681 Unsteadiness on feet: Secondary | ICD-10-CM | POA: Diagnosis not present

## 2021-08-15 DIAGNOSIS — Z96651 Presence of right artificial knee joint: Secondary | ICD-10-CM | POA: Diagnosis not present

## 2021-08-15 DIAGNOSIS — I5022 Chronic systolic (congestive) heart failure: Secondary | ICD-10-CM | POA: Diagnosis not present

## 2021-08-15 DIAGNOSIS — Z471 Aftercare following joint replacement surgery: Secondary | ICD-10-CM | POA: Diagnosis not present

## 2021-08-16 DIAGNOSIS — F32A Depression, unspecified: Secondary | ICD-10-CM | POA: Diagnosis not present

## 2021-08-16 DIAGNOSIS — M1711 Unilateral primary osteoarthritis, right knee: Secondary | ICD-10-CM | POA: Diagnosis not present

## 2021-08-16 DIAGNOSIS — R2681 Unsteadiness on feet: Secondary | ICD-10-CM | POA: Diagnosis not present

## 2021-08-16 DIAGNOSIS — Z96651 Presence of right artificial knee joint: Secondary | ICD-10-CM | POA: Diagnosis not present

## 2021-08-16 DIAGNOSIS — Z471 Aftercare following joint replacement surgery: Secondary | ICD-10-CM | POA: Diagnosis not present

## 2021-08-16 DIAGNOSIS — I5022 Chronic systolic (congestive) heart failure: Secondary | ICD-10-CM | POA: Diagnosis not present

## 2021-08-19 DIAGNOSIS — M1711 Unilateral primary osteoarthritis, right knee: Secondary | ICD-10-CM | POA: Diagnosis not present

## 2021-08-19 DIAGNOSIS — Z471 Aftercare following joint replacement surgery: Secondary | ICD-10-CM | POA: Diagnosis not present

## 2021-08-19 DIAGNOSIS — Z96651 Presence of right artificial knee joint: Secondary | ICD-10-CM | POA: Diagnosis not present

## 2021-08-19 DIAGNOSIS — R2681 Unsteadiness on feet: Secondary | ICD-10-CM | POA: Diagnosis not present

## 2021-08-19 DIAGNOSIS — F32A Depression, unspecified: Secondary | ICD-10-CM | POA: Diagnosis not present

## 2021-08-19 DIAGNOSIS — I5022 Chronic systolic (congestive) heart failure: Secondary | ICD-10-CM | POA: Diagnosis not present

## 2021-08-22 DIAGNOSIS — F32A Depression, unspecified: Secondary | ICD-10-CM | POA: Diagnosis not present

## 2021-08-22 DIAGNOSIS — Z96651 Presence of right artificial knee joint: Secondary | ICD-10-CM | POA: Diagnosis not present

## 2021-08-22 DIAGNOSIS — Z471 Aftercare following joint replacement surgery: Secondary | ICD-10-CM | POA: Diagnosis not present

## 2021-08-22 DIAGNOSIS — R2681 Unsteadiness on feet: Secondary | ICD-10-CM | POA: Diagnosis not present

## 2021-08-22 DIAGNOSIS — M1711 Unilateral primary osteoarthritis, right knee: Secondary | ICD-10-CM | POA: Diagnosis not present

## 2021-08-22 DIAGNOSIS — I5022 Chronic systolic (congestive) heart failure: Secondary | ICD-10-CM | POA: Diagnosis not present

## 2021-08-23 DIAGNOSIS — Z96651 Presence of right artificial knee joint: Secondary | ICD-10-CM | POA: Diagnosis not present

## 2021-08-26 DIAGNOSIS — Z471 Aftercare following joint replacement surgery: Secondary | ICD-10-CM | POA: Diagnosis not present

## 2021-08-26 DIAGNOSIS — R2681 Unsteadiness on feet: Secondary | ICD-10-CM | POA: Diagnosis not present

## 2021-08-26 DIAGNOSIS — M1711 Unilateral primary osteoarthritis, right knee: Secondary | ICD-10-CM | POA: Diagnosis not present

## 2021-08-26 DIAGNOSIS — F32A Depression, unspecified: Secondary | ICD-10-CM | POA: Diagnosis not present

## 2021-08-26 DIAGNOSIS — I5022 Chronic systolic (congestive) heart failure: Secondary | ICD-10-CM | POA: Diagnosis not present

## 2021-08-26 DIAGNOSIS — Z96651 Presence of right artificial knee joint: Secondary | ICD-10-CM | POA: Diagnosis not present

## 2021-08-29 DIAGNOSIS — Z471 Aftercare following joint replacement surgery: Secondary | ICD-10-CM | POA: Diagnosis not present

## 2021-08-29 DIAGNOSIS — F32A Depression, unspecified: Secondary | ICD-10-CM | POA: Diagnosis not present

## 2021-08-29 DIAGNOSIS — R2681 Unsteadiness on feet: Secondary | ICD-10-CM | POA: Diagnosis not present

## 2021-08-29 DIAGNOSIS — M1711 Unilateral primary osteoarthritis, right knee: Secondary | ICD-10-CM | POA: Diagnosis not present

## 2021-08-29 DIAGNOSIS — I5022 Chronic systolic (congestive) heart failure: Secondary | ICD-10-CM | POA: Diagnosis not present

## 2021-08-29 DIAGNOSIS — Z96651 Presence of right artificial knee joint: Secondary | ICD-10-CM | POA: Diagnosis not present

## 2021-09-05 ENCOUNTER — Encounter: Payer: Self-pay | Admitting: Internal Medicine

## 2021-09-06 MED ORDER — VENLAFAXINE HCL ER 150 MG PO CP24: ORAL_CAPSULE | ORAL | 3 refills | Status: DC

## 2021-09-06 MED ORDER — ATORVASTATIN CALCIUM 40 MG PO TABS: 40.0000 mg | ORAL_TABLET | Freq: Every evening | ORAL | 3 refills | Status: DC

## 2021-09-06 MED ORDER — LEVOTHYROXINE SODIUM 75 MCG PO TABS: ORAL_TABLET | ORAL | 3 refills | Status: DC

## 2021-09-06 MED ORDER — ALBUTEROL SULFATE HFA 108 (90 BASE) MCG/ACT IN AERS: 2.0000 | INHALATION_SPRAY | Freq: Four times a day (QID) | RESPIRATORY_TRACT | 2 refills | Status: DC | PRN

## 2021-09-06 MED ORDER — VENLAFAXINE HCL ER 75 MG PO CP24: ORAL_CAPSULE | ORAL | 3 refills | Status: DC

## 2021-09-06 MED ORDER — MIRTAZAPINE 15 MG PO TABS: 15.0000 mg | ORAL_TABLET | Freq: Every day | ORAL | 3 refills | Status: DC

## 2021-09-06 MED ORDER — ASPIRIN 81 MG PO TBEC: 81.0000 mg | DELAYED_RELEASE_TABLET | Freq: Every day | ORAL | 3 refills | Status: DC

## 2021-09-06 MED ORDER — OMEPRAZOLE 20 MG PO CPDR: 20.0000 mg | DELAYED_RELEASE_CAPSULE | Freq: Every evening | ORAL | 3 refills | Status: AC

## 2021-09-06 NOTE — Telephone Encounter (Signed)
Everything but clonazepam and tizanidine sent to [facility]. Forwarding to Dr Silvio Pate to do the other 2 meds. ?

## 2021-09-06 NOTE — Addendum Note (Signed)
Addended by: Pilar Grammes on: 09/06/2021 03:36 PM ? ? Modules accepted: Orders ? ?

## 2021-09-08 MED ORDER — CLONAZEPAM 1 MG PO TABS: 1.0000 mg | ORAL_TABLET | Freq: Every day | ORAL | 0 refills | Status: DC

## 2021-09-08 MED ORDER — TIZANIDINE HCL 4 MG PO TABS: 4.0000 mg | ORAL_TABLET | Freq: Three times a day (TID) | ORAL | 1 refills | Status: DC | PRN

## 2021-09-08 NOTE — Addendum Note (Signed)
Addended by: [name] I on: 09/08/2021 10:10 AM ? ? Modules accepted: Orders ? ?

## 2021-09-09 ENCOUNTER — Ambulatory Visit: Payer: Medicare Other | Admitting: Internal Medicine

## 2021-10-07 DIAGNOSIS — Z20822 Contact with and (suspected) exposure to covid-19: Secondary | ICD-10-CM | POA: Diagnosis not present

## 2021-10-13 DIAGNOSIS — Z20822 Contact with and (suspected) exposure to covid-19: Secondary | ICD-10-CM | POA: Diagnosis not present

## 2021-10-15 DIAGNOSIS — H43813 Vitreous degeneration, bilateral: Secondary | ICD-10-CM | POA: Diagnosis not present

## 2021-10-16 DIAGNOSIS — Z20822 Contact with and (suspected) exposure to covid-19: Secondary | ICD-10-CM | POA: Diagnosis not present

## 2021-10-19 DIAGNOSIS — Z20822 Contact with and (suspected) exposure to covid-19: Secondary | ICD-10-CM | POA: Diagnosis not present

## 2021-11-04 ENCOUNTER — Ambulatory Visit (INDEPENDENT_AMBULATORY_CARE_PROVIDER_SITE_OTHER): Payer: Medicare Other | Admitting: Internal Medicine

## 2021-11-04 ENCOUNTER — Encounter: Payer: Self-pay | Admitting: Internal Medicine

## 2021-11-04 VITALS — BP 122/80 | HR 71 | Temp 98.2°F | Ht 65.0 in | Wt 183.0 lb

## 2021-11-04 DIAGNOSIS — Z7189 Other specified counseling: Secondary | ICD-10-CM | POA: Diagnosis not present

## 2021-11-04 DIAGNOSIS — E039 Hypothyroidism, unspecified: Secondary | ICD-10-CM

## 2021-11-04 DIAGNOSIS — E213 Hyperparathyroidism, unspecified: Secondary | ICD-10-CM | POA: Diagnosis not present

## 2021-11-04 DIAGNOSIS — I5032 Chronic diastolic (congestive) heart failure: Secondary | ICD-10-CM

## 2021-11-04 DIAGNOSIS — Z0000 Encounter for general adult medical examination without abnormal findings: Secondary | ICD-10-CM | POA: Diagnosis not present

## 2021-11-04 DIAGNOSIS — F330 Major depressive disorder, recurrent, mild: Secondary | ICD-10-CM

## 2021-11-04 NOTE — Assessment & Plan Note (Signed)
Seems to be euthyroid on levothyroxine 75

## 2021-11-04 NOTE — Assessment & Plan Note (Signed)
Has DNR 

## 2021-11-04 NOTE — Progress Notes (Signed)
Hearing Screening - Comments:: Has hearing aids. Not wearing them today. Vision Screening - Comments:: April 2023  

## 2021-11-04 NOTE — Assessment & Plan Note (Signed)
Neutral fluid status now No Rx  Is on ASA '81mg'$  daily

## 2021-11-04 NOTE — Progress Notes (Signed)
Subjective:    Patient ID: [name], female    DOB: Jul 03, 1935, 86 y.o.   MRN: [id number]  HPI Here for Medicare wellness visit and follow up of chronic health conditions Reviewed advanced directives Reviewed other doctors--Dr Brasington--ophthal, Dr Katrine Coho, Dr Gwenith Spitz surgeon, awaiting another rheumatologist, Dr Crecencio Mc Had TIA in August 2022, acute hypoxia in October---unclear etiology, then R TKR in January 2023. Vision is okay Hearing is okay with aides Not really exercising Has fallen once---no major injury No alcohol or tobacco Chronic depression Independent with instrumental ADLs Mild memory issues  Feels like she is finally "over the hump" of grieving Still "has her moments" but overall better Continues on venlafaxine and mirtazapine--has been on these for 20 years Uses clonazepam to help her sleep  Known primary hyperparathyroidism Had been scheduled for surgery--but had to cancel due to her husband's illness Will recheck today  No recent edema No chest pain No SOB--but gets some wheezing at times (albuterol helps)  Current Outpatient Medications on File Prior to Visit  Medication Sig Dispense Refill   albuterol (VENTOLIN HFA) 108 (90 Base) MCG/ACT inhaler Inhale 2 puffs into the lungs every 6 (six) hours as needed for wheezing or shortness of breath. 8 g 2   aspirin 81 MG EC tablet Take 1 tablet (81 mg total) by mouth daily. Swallow whole. 90 tablet 3   atorvastatin (LIPITOR) 40 MG tablet Take 1 tablet (40 mg total) by mouth every evening. 90 tablet 3   clonazePAM (KLONOPIN) 1 MG tablet Take 1 tablet (1 mg total) by mouth at bedtime. 90 tablet 0   levothyroxine (SYNTHROID) 75 MCG tablet TAKE ONE TABLET BY MOUTH DAILY BEFORE BREAKFAST 90 tablet 3   mirtazapine (REMERON) 15 MG tablet Take 1 tablet (15 mg total) by mouth at bedtime. 90 tablet 3   omeprazole (PRILOSEC) 20 MG capsule Take 1 capsule (20 mg total) by mouth every evening. 90  capsule 3   Probiotic Product (ALIGN) 4 MG CAPS Take 1 capsule by mouth at bedtime.     tiZANidine (ZANAFLEX) 4 MG tablet Take 1 tablet (4 mg total) by mouth every 8 (eight) hours as needed for muscle spasms. 60 tablet 1   venlafaxine XR (EFFEXOR-XR) 150 MG 24 hr capsule TAKE ONE CAPSULE BY MOUTH DAILY ALONG WITH '75MG'$  CAPSULE 90 capsule 3   venlafaxine XR (EFFEXOR-XR) 75 MG 24 hr capsule TAKE ONE CAPSULE BY MOUTH DAILY ALONG WITH '150MG'$  CAPSULE 90 capsule 3   No current facility-administered medications on file prior to visit.    No Known Allergies  Past Medical History:  Diagnosis Date   CHF (congestive heart failure) ([place]) 0017   diastolic   [id number]-67 59/16/3846   Diverticulosis    DVT (deep venous thrombosis) ([place]) 06/16/2010   after TKR   Hyperlipidemia    Hyperparathyroidism ([place])    Hypothyroidism    IBS (irritable bowel syndrome)    Inflammatory arthritis    Osteoarthritis, knee    Recurrent major depression ([place])    TIA (transient ischemic attack) 01/2021    Past Surgical History:  Procedure Laterality Date   ABDOMINAL HYSTERECTOMY  06/16/1989   CATARACT EXTRACTION W/ INTRAOCULAR LENS  IMPLANT, BILATERAL  06/16/2012   CHOLECYSTECTOMY  06/16/1996   COLONOSCOPY     DEXA  08/15/2007   Normal   HAMMER TOE SURGERY Left 10/15/2014   Dr Barkley Bruns   LUMBAR FUSION  06/17/1991   MENISCUS REPAIR Left 04/16/2010   TOTAL KNEE ARTHROPLASTY Left 06/16/2010  TOTAL KNEE ARTHROPLASTY Right 07/09/2021   Procedure: TOTAL KNEE ARTHROPLASTY;  Surgeon: Corky Mull, MD;  Location: [facility] ORS;  Service: Orthopedics;  Laterality: Right;    Family History  Problem Relation Age of Onset   Cancer Mother        colon   Depression Mother    Cancer Father    Depression Sister    Hypertension Neg Hx    Heart disease Neg Hx    Diabetes Neg Hx     Social History   Socioeconomic History   Marital status: Widowed    Spouse name: Not on file   Number of children: 3   Years of education:  Not on file   Highest education level: Not on file  Occupational History   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    Comment: Retired  Tobacco Use   Smoking status: Former    Types: Cigarettes    Quit date: 06/16/1981    Years since quitting: 40.4    Passive exposure: Past (as a child)   Smokeless tobacco: Never  Vaping Use   Vaping Use: Never used  Substance and Sexual Activity   Alcohol use: Yes    Alcohol/week: 0.0 standard drinks    Comment: rarely   Drug use: No   Sexual activity: Not Currently  Other Topics Concern   Not on file  Social History Narrative   [place] in [place]   Widowed 1/23      Has living will    Daughter Joycelyn Schmid, have health care POA   Has DNR ---order rewritten   No feeding tube if cognitively unaware   Will be donating body to [facility] Determinants of Health   Financial Resource Strain: Not on file  Food Insecurity: Not on file  Transportation Needs: Not on file  Physical Activity: Not on file  Stress: Not on file  Social Connections: Not on file  Intimate Partner Violence: Not on file   Review of Systems Appetite is okay Weight is stable Wears seat belt Sleeps okay with meds No problems with teeth--sees dentist No suspicious skin lesions Some digestion problems---IBS. Has tried various diet changes. Diarrhea prone No heartburn or dysphagia Some back pain ---if persistent standing    Objective:   Physical Exam Constitutional:      Appearance: Normal appearance.  HENT:     Mouth/Throat:     Comments: No lesions Eyes:     Conjunctiva/sclera: Conjunctivae normal.     Pupils: Pupils are equal, round, and reactive to light.  Cardiovascular:     Rate and Rhythm: Normal rate and regular rhythm.     Pulses: Normal pulses.     Heart sounds: No murmur heard.   No gallop.  Pulmonary:     Effort: Pulmonary effort is normal.     Breath sounds: Normal breath sounds. No wheezing or  rales.  Abdominal:     Palpations: Abdomen is soft.     Tenderness: There is no abdominal tenderness.  Musculoskeletal:     Cervical back: Neck supple.     Right lower leg: No edema.     Left lower leg: No edema.  Lymphadenopathy:     Cervical: No cervical adenopathy.  Skin:    Findings: No lesion or rash.  Neurological:     General: No focal deficit present.     Mental Status: She is alert and oriented to person, place, and time.     Comments:  Mini-cog normal  Psychiatric:        Mood and Affect: Mood normal.        Behavior: Behavior normal.           Assessment & Plan:

## 2021-11-04 NOTE — Assessment & Plan Note (Signed)
Will recheck labs today Consider rechecking DEXA if need for surgery not clear cut

## 2021-11-04 NOTE — Assessment & Plan Note (Signed)
Has been doing better now---with the grieving Continues on venlafaxine '225mg'$  daily, mirtazapine '15mg'$  and clonazepam '1mg'$  at bedtime

## 2021-11-04 NOTE — Assessment & Plan Note (Signed)
I have personally reviewed the Medicare Annual Wellness questionnaire and have noted 1. [name] medical and social history 2. Their use of alcohol, tobacco or illicit drugs 3. Their current medications and supplements 4. [name] functional ability including ADL's, fall risks, home safety risks and hearing or visual             impairment. 5. Diet and physical activities 6. Evidence for depression or mood disorders  The patients weight, height, BMI and visual acuity have been recorded in the chart I have made referrals, counseling and provided education to the patient based review of the above and I have provided the pt with a written personalized care plan for preventive services.  I have provided you with a copy of your personalized plan for preventive services. Please take the time to review along with your updated medication list.  No cancer screening due to age Discussed starting exercise COVID booster and flu vaccine by the fall

## 2021-11-05 LAB — PARATHYROID HORMONE, INTACT (NO CA): PTH: 153 pg/mL — ABNORMAL HIGH (ref 16–77)

## 2021-11-05 LAB — HEPATIC FUNCTION PANEL
ALT: 17 U/L (ref 0–35)
AST: 16 U/L (ref 0–37)
Albumin: 4.4 g/dL (ref 3.5–5.2)
Alkaline Phosphatase: 115 U/L (ref 39–117)
Bilirubin, Direct: 0.1 mg/dL (ref 0.0–0.3)
Total Bilirubin: 0.4 mg/dL (ref 0.2–1.2)
Total Protein: 6.8 g/dL (ref 6.0–8.3)

## 2021-11-05 LAB — RENAL FUNCTION PANEL
Albumin: 4.4 g/dL (ref 3.5–5.2)
BUN: 21 mg/dL (ref 6–23)
CO2: 30 mEq/L (ref 19–32)
Calcium: 11.1 mg/dL — ABNORMAL HIGH (ref 8.4–10.5)
Chloride: 102 mEq/L (ref 96–112)
Creatinine, Ser: 0.89 mg/dL (ref 0.40–1.20)
GFR: 59.58 mL/min — ABNORMAL LOW (ref >60.00)
Glucose, Bld: 93 mg/dL (ref 70–99)
Phosphorus: 3.6 mg/dL (ref 2.3–4.6)
Potassium: 4.6 mEq/L (ref 3.5–5.1)
Sodium: 140 mEq/L (ref 135–145)

## 2021-11-05 LAB — CBC
HCT: 42 % (ref 36.0–46.0)
Hemoglobin: 13.7 g/dL (ref 12.0–15.0)
MCHC: 32.6 g/dL (ref 30.0–36.0)
MCV: 83.6 fl (ref 78.0–100.0)
Platelets: 351 10*3/uL (ref 150.0–400.0)
RBC: 5.02 Mil/uL (ref 3.87–5.11)
RDW: 14.7 % (ref 11.5–15.5)
WBC: 7.7 10*3/uL (ref 4.0–10.5)

## 2021-11-05 LAB — LIPID PANEL
Cholesterol: 162 mg/dL (ref 0–200)
HDL: 41.9 mg/dL (ref >39.00)
NonHDL: 120.22
Total CHOL/HDL Ratio: 4
Triglycerides: 237 mg/dL — ABNORMAL HIGH (ref 0.0–149.0)
VLDL: 47.4 mg/dL — ABNORMAL HIGH (ref 0.0–40.0)

## 2021-11-05 LAB — T4, FREE: Free T4: 0.69 ng/dL (ref 0.60–1.60)

## 2021-11-05 LAB — VITAMIN D 25 HYDROXY (VIT D DEFICIENCY, FRACTURES): VITD: 48.89 ng/mL (ref 30.00–100.00)

## 2021-11-05 LAB — TSH: TSH: 2.63 u[IU]/mL (ref 0.35–5.50)

## 2021-11-05 LAB — LDL CHOLESTEROL, DIRECT: Direct LDL: 86 mg/dL

## 2021-12-03 ENCOUNTER — Other Ambulatory Visit: Payer: Self-pay | Admitting: Internal Medicine

## 2021-12-03 NOTE — Telephone Encounter (Signed)
Caller Name: Margene Cherian Call back phone #: [phone]  MEDICATION(S): clonazePAM (KLONOPIN) 1 MG tablet   Days of Med Remaining: 3 days  Has the patient contacted their pharmacy (YES/NO)?  Yes, pt is switching pharmacies and needs sent to the new one IF YES, when and what did the pharmacy advise?  IF NO, request that the patient contact the pharmacy for the refills in the future.             The pharmacy will send an electronic request (except for controlled medications).  Preferred Pharmacy: [facility], [address], [place] [address]  ~~~Please advise patient/caregiver to allow 2-3 business days to process RX refills.

## 2021-12-04 MED ORDER — CLONAZEPAM 1 MG PO TABS: 1.0000 mg | ORAL_TABLET | Freq: Every day | ORAL | 0 refills | Status: DC

## 2021-12-04 NOTE — Telephone Encounter (Signed)
Last filled 09-08-21 #90 Last OV 11-04-21 Next OV 11-06-22 [facility] [address]

## 2021-12-12 DIAGNOSIS — M19042 Primary osteoarthritis, left hand: Secondary | ICD-10-CM | POA: Diagnosis not present

## 2021-12-12 DIAGNOSIS — M11232 Other chondrocalcinosis, left wrist: Secondary | ICD-10-CM | POA: Diagnosis not present

## 2021-12-12 DIAGNOSIS — M1811 Unilateral primary osteoarthritis of first carpometacarpal joint, right hand: Secondary | ICD-10-CM | POA: Diagnosis not present

## 2021-12-12 DIAGNOSIS — M184 Other bilateral secondary osteoarthritis of first carpometacarpal joints: Secondary | ICD-10-CM | POA: Diagnosis not present

## 2021-12-12 DIAGNOSIS — M11231 Other chondrocalcinosis, right wrist: Secondary | ICD-10-CM | POA: Diagnosis not present

## 2021-12-23 DIAGNOSIS — Z96651 Presence of right artificial knee joint: Secondary | ICD-10-CM | POA: Diagnosis not present

## 2021-12-23 DIAGNOSIS — M1711 Unilateral primary osteoarthritis, right knee: Secondary | ICD-10-CM | POA: Diagnosis not present

## 2022-02-18 ENCOUNTER — Telehealth: Payer: Self-pay

## 2022-02-18 NOTE — Patient Outreach (Signed)
  Care Coordination   Initial Visit Note   02/18/2022 Name: Wendy Murray MRN: [id number] DOB: [birth year]  Wendy Murray is a 86 y.o. year old female who sees Venia Carbon, MD for primary care. I spoke with  [name] by phone today.  What matters to the patients health and wellness today?  Patient verbalized no nursing or community resource needs at this time.  Per chart review annual wellness visit completed 11/04/21    Goals Addressed             This Visit's Progress    Care coordination activities- No follow up needed.       Care Coordination Interventions: Care coordination program/ services discussed Social determinants of health survey completed Patient advised to contact his primary care provider office for referral if care coordination services needed in the future.           SDOH assessments and interventions completed:  Yes     Care Coordination Interventions Activated:  Yes  Care Coordination Interventions:  Yes, provided   Follow up plan: No further intervention required.   Encounter Outcome:  Pt. Visit Completed   Quinn Plowman RN,BSN,CCM [place] Coordinator [phone]

## 2022-02-18 NOTE — Patient Instructions (Signed)
Visit Information  Thank you for taking time to visit with me today. Please don't hesitate to contact me if I can be of assistance to you.   Following are the goals we discussed today:   Goals Addressed             This Visit's Progress    Care coordination activities- No follow up needed.       Care Coordination Interventions: Care coordination program/ services discussed Social determinants of health survey completed Patient advised to contact his primary care provider office for referral if care coordination services needed in the future.          If you are experiencing a Mental Health or [place] or need someone to talk to, please call 1-800-273-TALK (toll free, 24 hour hotline)  Patient verbalizes understanding of instructions and care plan provided today and agrees to view in [place]. Active MyChart status and patient understanding of how to access instructions and care plan via MyChart confirmed with patient.     Quinn Plowman RN,BSN,CCM RN Care Manager Coordinator [phone]

## 2022-03-03 ENCOUNTER — Other Ambulatory Visit: Payer: Self-pay | Admitting: Internal Medicine

## 2022-03-04 NOTE — Telephone Encounter (Signed)
Last filled 12-04-21 #90 Last OV 11-04-21 Next OV 11-06-22 [facility] [address]

## 2022-03-14 DIAGNOSIS — H532 Diplopia: Secondary | ICD-10-CM | POA: Diagnosis not present

## 2022-04-01 DIAGNOSIS — Z23 Encounter for immunization: Secondary | ICD-10-CM | POA: Diagnosis not present

## 2022-05-20 ENCOUNTER — Emergency Department
Admission: EM | Admit: 2022-05-20 | Discharge: 2022-05-20 | Disposition: A | Discharge status: Home / Self Care | Payer: Medicare Other | Attending: Emergency Medicine | Admitting: Emergency Medicine

## 2022-05-20 ENCOUNTER — Encounter: Payer: Self-pay | Admitting: Emergency Medicine

## 2022-05-20 ENCOUNTER — Emergency Department: Payer: Medicare Other

## 2022-05-20 ENCOUNTER — Other Ambulatory Visit: Payer: Self-pay

## 2022-05-20 ENCOUNTER — Telehealth: Payer: Self-pay | Admitting: Internal Medicine

## 2022-05-20 DIAGNOSIS — E039 Hypothyroidism, unspecified: Secondary | ICD-10-CM | POA: Insufficient documentation

## 2022-05-20 DIAGNOSIS — I5032 Chronic diastolic (congestive) heart failure: Secondary | ICD-10-CM | POA: Insufficient documentation

## 2022-05-20 DIAGNOSIS — J45909 Unspecified asthma, uncomplicated: Secondary | ICD-10-CM | POA: Diagnosis not present

## 2022-05-20 DIAGNOSIS — Z96651 Presence of right artificial knee joint: Secondary | ICD-10-CM | POA: Diagnosis not present

## 2022-05-20 DIAGNOSIS — R7989 Other specified abnormal findings of blood chemistry: Secondary | ICD-10-CM | POA: Diagnosis not present

## 2022-05-20 DIAGNOSIS — I10 Essential (primary) hypertension: Secondary | ICD-10-CM | POA: Diagnosis not present

## 2022-05-20 DIAGNOSIS — R911 Solitary pulmonary nodule: Secondary | ICD-10-CM | POA: Diagnosis not present

## 2022-05-20 DIAGNOSIS — Z8616 Personal history of COVID-19: Secondary | ICD-10-CM | POA: Diagnosis not present

## 2022-05-20 DIAGNOSIS — R0602 Shortness of breath: Secondary | ICD-10-CM | POA: Diagnosis not present

## 2022-05-20 LAB — CBC WITH DIFFERENTIAL/PLATELET
Abs Immature Granulocytes: 0.04 10*3/uL (ref 0.00–0.07)
Basophils Absolute: 0.1 10*3/uL (ref 0.0–0.1)
Basophils Relative: 1 %
Eosinophils Absolute: 0.3 10*3/uL (ref 0.0–0.5)
Eosinophils Relative: 2 %
HCT: 45.1 % (ref 36.0–46.0)
Hemoglobin: 13.8 g/dL (ref 12.0–15.0)
Immature Granulocytes: 0 %
Lymphocytes Relative: 19 %
Lymphs Abs: 2.1 10*3/uL (ref 0.7–4.0)
MCH: 26.8 pg (ref 26.0–34.0)
MCHC: 30.6 g/dL (ref 30.0–36.0)
MCV: 87.6 fL (ref 80.0–100.0)
Monocytes Absolute: 0.9 10*3/uL (ref 0.1–1.0)
Monocytes Relative: 8 %
Neutro Abs: 7.5 10*3/uL (ref 1.7–7.7)
Neutrophils Relative %: 70 %
Platelets: 433 10*3/uL — ABNORMAL HIGH (ref 150–400)
RBC: 5.15 MIL/uL — ABNORMAL HIGH (ref 3.87–5.11)
RDW: 14.1 % (ref 11.5–15.5)
WBC: 10.9 10*3/uL — ABNORMAL HIGH (ref 4.0–10.5)
nRBC: 0 % (ref 0.0–0.2)

## 2022-05-20 LAB — D-DIMER, QUANTITATIVE: D-Dimer, Quant: 2.49 ug/mL-FEU — ABNORMAL HIGH (ref 0.00–0.50)

## 2022-05-20 LAB — BASIC METABOLIC PANEL
Anion gap: 9 (ref 5–15)
BUN: 17 mg/dL (ref 8–23)
CO2: 25 mmol/L (ref 22–32)
Calcium: 10.7 mg/dL — ABNORMAL HIGH (ref 8.9–10.3)
Chloride: 104 mmol/L (ref 98–111)
Creatinine, Ser: 0.8 mg/dL (ref 0.44–1.00)
GFR, Estimated: >60 mL/min (ref >60)
Glucose, Bld: 103 mg/dL — ABNORMAL HIGH (ref 70–99)
Potassium: 3.6 mmol/L (ref 3.5–5.1)
Sodium: 138 mmol/L (ref 135–145)

## 2022-05-20 LAB — BRAIN NATRIURETIC PEPTIDE: B Natriuretic Peptide: 34 pg/mL (ref 0.0–100.0)

## 2022-05-20 LAB — TROPONIN I (HIGH SENSITIVITY): Troponin I (High Sensitivity): 5 ng/L (ref <18)

## 2022-05-20 MED ORDER — IOHEXOL 350 MG/ML SOLN
75.0000 mL | Freq: Once | INTRAVENOUS | Status: AC | PRN
  Administered 2022-05-20: 75 mL via INTRAVENOUS

## 2022-05-20 NOTE — ED Provider Notes (Signed)
[facility] Provider Note    Event Date/Time   First MD Initiated Contact with Patient 05/20/22 1749     (approximate)   History   Shortness of Breath and Dizziness   HPI  Wendy Murray is a 86 y.o. female medical history of CHF DVT TIA who presents with feeling dizzy.  Patient says she woke this morning and had an abnormal sensation in her head that she denies feeling like pain just felt off.  She then felt which she describes as dizzy but she has difficulty specifically describing the symptoms.  She denies room spinning sensation or sensation of movement denies lightheadedness or feeling like she is going to pass out.  Says that she feels that if she were to stand up she would fall however she has not actually fallen or had difficulty ambulating.  Denies diplopia difficulty speaking numbness tingling weakness.  Denies chest pain.  Does have some mild dyspnea.  Says she has had shortness of breath for about a year since she had her knee replaced she attributes this to not gaining the strength after her knee replacement.  Denies cough fevers chills no urinary symptoms    Past Medical History:  Diagnosis Date   CHF (congestive heart failure) ([place]) 2831   diastolic   DVVOH-60 73/71/0626   Diverticulosis    DVT (deep venous thrombosis) ([place]) 06/16/2010   after TKR   Hyperlipidemia    Hyperparathyroidism ([place])    Hypothyroidism    IBS (irritable bowel syndrome)    Inflammatory arthritis    Osteoarthritis, knee    Recurrent major depression ([place])    TIA (transient ischemic attack) 01/2021    Patient Active Problem List   Diagnosis Date Noted   Status post total knee replacement using cement, right 07/09/2021   Asthmatic bronchitis    Chronic diastolic CHF (congestive heart failure) ([place]) 03/23/2021   TIA (transient ischemic attack) 01/23/2021   GERD (gastroesophageal reflux disease) 12/16/2016   Hyperparathyroidism ([place])    Advance directive  discussed with patient 12/13/2014   Routine general medical examination at a health care facility 12/12/2013   Hyperlipidemia    Recurrent major depression ([facility])    Hypothyroidism    IBS (irritable bowel syndrome)      Physical Exam  Triage Vital Signs: ED Triage Vitals  Enc Vitals Group     BP 05/20/22 1720 (!) 181/96     Pulse Rate 05/20/22 1717 90     Resp 05/20/22 1717 16     Temp 05/20/22 1717 97.8 F (36.6 C)     Temp Source 05/20/22 1717 Oral     SpO2 05/20/22 1720 92 %     Weight 05/20/22 1717 180 lb (81.6 kg)     Height 05/20/22 1717 '5\' 5"'$  (1.651 m)     Head Circumference --      Peak Flow --      Pain Score 05/20/22 1717 0     Pain Loc --      Pain Edu? --      Excl. in [place]? --     Most recent vital signs: Vitals:   05/20/22 1826 05/20/22 1948  BP: (!) 179/106 (!) 172/79  Pulse: 99 82  Resp: 18 17  Temp: 98.2 F (36.8 C) 99.2 F (37.3 C)  SpO2: 94% 93%     General: Awake, no distress.  CV:  Good peripheral perfusion.  Resp:  Normal effort.  Lungs are clear Abd:  No distention.  Neuro:             Awake, Alert, Oriented x 3  Other:  Aox3, nml speech  PERRL, EOMI, face symmetric, nml tongue movement  5/5 strength in the BL upper and lower extremities  Sensation grossly intact in the BL upper and lower extremities  Finger-nose-finger intact BL  Normal gait no ataxia   ED Results / Procedures / Treatments  Labs (all labs ordered are listed, but only abnormal results are displayed) Labs Reviewed  CBC WITH DIFFERENTIAL/PLATELET - Abnormal; Notable for the following components:      Result Value   WBC 10.9 (*)    RBC 5.15 (*)    Platelets 433 (*)    All other components within normal limits  BASIC METABOLIC PANEL - Abnormal; Notable for the following components:   Glucose, Bld 103 (*)    Calcium 10.7 (*)    All other components within normal limits  D-DIMER, QUANTITATIVE - Abnormal; Notable for the following components:   D-Dimer, Quant 2.49  (*)    All other components within normal limits  BRAIN NATRIURETIC PEPTIDE  TROPONIN I (HIGH SENSITIVITY)  TROPONIN I (HIGH SENSITIVITY)     EKG  EKG interpretation performed by myself: NSR, nml axis, nml intervals, ST depression in multiple leads similar to prior EKG     RADIOLOGY  I reviewed and interpreted in the Chest x-ray shows atelectasis at the left base  PROCEDURES:  Critical Care performed: No  Procedures  The patient is on the cardiac monitor to evaluate for evidence of arrhythmia and/or significant heart rate changes.   MEDICATIONS ORDERED IN ED: Medications  iohexol (OMNIPAQUE) 350 MG/ML injection 75 mL (75 mLs Intravenous Contrast Given 05/20/22 1902)     IMPRESSION / MDM / ASSESSMENT AND PLAN / ED COURSE  I reviewed the triage vital signs and the nursing notes.                              Patient's presentation is most consistent with acute presentation with potential threat to life or bodily function.  Differential diagnosis includes, but is not limited to, pneumonia CHF pulmonary embolism anemia electrode abnormality vertigo orthostatic hypotension vasovagal syncope posterior stroke  Patient is an 86 year old female who presents with very vague symptoms of feeling dizzy and abnormal in her head.  She has difficulty describing the exact nature of the symptoms but it started today she denies any pain associated denies frank vertiginous symptoms or lightheadedness/presyncope but says she feels like if she were to get up that she would fall although she did not actually fall.  Denies visual change numbness tingling or weakness.  She is hypertensive which is new for her she has no prior history of hypertension.  Denies associated chest pain.  Does have about a year of dyspnea and this feels worse over the last day.  She is satting 94% on room air.  Exam is reassuring neurologic exam is nonfocal I have low suspicion for posterior stroke she is able to ambulate  has no ataxia and is minimally symptomatic with ambulation.  Patient's EKG does have some nonspecific ST depression but it looks similar to prior.  Will check a troponin.  BNP is negative labs including CBC and CMP are reassuring.  Given this dizziness and shortness of breath with borderline hypoxia will obtain D-dimer to screen for pulmonary embolism.  D-dimer is positive.  CTA obtained is negative for bony  or embolism.  There is some note of pulmonary nodule which I discussed with the patient need for follow-up in 3 months with repeat CT. we discussed daily check of her blood pressure and following up with PCP to discuss blood pressure management.       FINAL CLINICAL IMPRESSION(S) / ED DIAGNOSES   Final diagnoses:  Pulmonary nodule  Hypertension, unspecified type     Rx / DC Orders   ED Discharge Orders     None        Note:  This document was prepared using Dragon voice recognition software and may include unintentional dictation errors.   Rada Hay, MD 05/20/22 2041

## 2022-05-20 NOTE — Telephone Encounter (Signed)
Per chart review tab pt is at [facility] ED. Sending note to Dr Silvio Pate and Silvio Pate pool.

## 2022-05-20 NOTE — Telephone Encounter (Signed)
[facility] - Client TELEPHONE ADVICE RECORD AccessNurse Patient Name: Wendy Murray [facility] Gender: Female DOB: [birth year] Age: 86 Y 74 M 6 D Return Phone Number: [phone] (Primary) Address: City/ State/ Zip: [place]  [address] Client [facility] - Client Client Site [place] - Day Provider [name]- MD Contact Type Call Who Is Calling Patient / Member / Family / Caregiver Call Type Triage / Clinical Relationship To Patient Self Return Phone Number [phone] (Primary) Chief Complaint BREATHING - shortness of breath or sounds breathless Reason for Call Symptomatic / Request for [place] states she is calling from [facility] with a pt. that is on the line and states she has high blood pressure 180/100 and is experiencing dizziness, shortness of breath and lightheadedness. Translation No Nurse Assessment Nurse: Self, RN, Nira Conn Date/Time (Eastern Time): 05/20/2022 2:35:52 PM Confirm and document reason for call. If symptomatic, describe symptoms. ---Caller says BP is 180 /100 and dizzy , SOB , Light headed Does the patient have any new or worsening symptoms? ---Yes Will a triage be completed? ---Yes Related visit to physician within the last 2 weeks? ---No Does the PT have any chronic conditions? (i.e. diabetes, asthma, this includes High risk factors for pregnancy, etc.) ---No Is this a behavioral health or substance abuse call? ---No Guidelines Guideline Title Affirmed Question Affirmed Notes Nurse Date/Time (Eastern Time) Blood Pressure - High [9] Systolic BP >= 381 OR Diastolic >= 017 AND [5] cardiac (e.g., breathing difficulty, chest pain) or neurologic symptoms (e.g., new-onset blurred or double vision, unsteady gait) Self, RN, Nira Conn 05/20/2022 2:37:09 PM PLEASE NOTE: All timestamps contained within this report  are represented as [place] Standard Time. CONFIDENTIALTY NOTICE: This fax transmission is intended only for the addressee. It contains information that is legally privileged, confidential or otherwise protected from use or disclosure. If you are not the intended recipient, you are strictly prohibited from reviewing, disclosing, copying using or disseminating any of this information or taking any action in reliance on or regarding this information. If you have received this fax in error, please notify us immediately by telephone so that we can arrange for its return to [place]. Phone: [phone], Toll-Free: [phone], Fax: [phone] Page: 2 of 2 Call Id: [id number] [place]. Time Eilene Ghazi Time) Disposition Final User 05/20/2022 2:34:06 PM Send to Urgent Queue [place], [place] 05/20/2022 2:38:33 PM Go to ED Now Yes Self, RN, Nira Conn Final Disposition 05/20/2022 2:38:33 PM Go to ED Now Yes Self, RN, Soyla Murphy Disagree/Comply Comply Caller Understands Yes PreDisposition Call Doctor Care Advice Given Per Guideline GO TO ED NOW: * You need to be seen in the Emergency Department. * Leave now. Drive carefully. * Go to the ED at ___________ [place] given per High Blood Pressure (Adult) guideline. CALL EMS 911 IF: * You become worse NOTE TO TRIAGER - DRIVING: * Another adult should drive. Comments User: Robynn Pane, RN Date/Time Eilene Ghazi Time): 05/20/2022 2:40:02 PM Caller says Nurse has came in and checked her BP and they are concerned and wandering if she can be seen . Referrals [place]

## 2022-05-20 NOTE — Telephone Encounter (Signed)
Patient called in and stated that her blood pressure is reading high at 180/100 with a pulse of 83. She stated she is experiencing dizziness, lightheadedness, and SOB. Sent over to access nurse.

## 2022-05-20 NOTE — Discharge Instructions (Addendum)
You have a nodule on your CT of your chest.  You should have a repeat CT at 3 months to ensure that this is resolving.  Your blood pressure was elevated.  Please check your blood pressure once daily for the next 2 weeks and if it is consistently elevated you will need to follow-up with your primary doctor as you will likely need to be started on blood pressure medication.

## 2022-05-20 NOTE — ED Triage Notes (Signed)
Says she woke up this am with sob and dizziness, blood pressure was up.

## 2022-05-21 NOTE — Telephone Encounter (Addendum)
Spoke to pt. She has an appt here on 12-8. She said she is still feeling like she did yesterday. Still feeling dizzy and BP is elevated. I offered to move the appt to tomorrow. She said she wanted to keep it Friday so she can have more documentation to give to Dr Silvio Pate. I advised if her symptoms change or worsen, she will need to go back to the ER.

## 2022-05-22 DIAGNOSIS — H43813 Vitreous degeneration, bilateral: Secondary | ICD-10-CM | POA: Diagnosis not present

## 2022-05-23 ENCOUNTER — Encounter: Payer: Self-pay | Admitting: Internal Medicine

## 2022-05-23 ENCOUNTER — Telehealth: Payer: Self-pay

## 2022-05-23 ENCOUNTER — Ambulatory Visit (INDEPENDENT_AMBULATORY_CARE_PROVIDER_SITE_OTHER): Payer: Medicare Other | Admitting: Internal Medicine

## 2022-05-23 VITALS — BP 152/86 | HR 107 | Temp 97.8°F | Ht 65.0 in | Wt 187.0 lb

## 2022-05-23 DIAGNOSIS — I5032 Chronic diastolic (congestive) heart failure: Secondary | ICD-10-CM

## 2022-05-23 DIAGNOSIS — Z23 Encounter for immunization: Secondary | ICD-10-CM | POA: Diagnosis not present

## 2022-05-23 DIAGNOSIS — F330 Major depressive disorder, recurrent, mild: Secondary | ICD-10-CM | POA: Diagnosis not present

## 2022-05-23 DIAGNOSIS — Z8673 Personal history of transient ischemic attack (TIA), and cerebral infarction without residual deficits: Secondary | ICD-10-CM

## 2022-05-23 DIAGNOSIS — I10 Essential (primary) hypertension: Secondary | ICD-10-CM | POA: Diagnosis not present

## 2022-05-23 DIAGNOSIS — R911 Solitary pulmonary nodule: Secondary | ICD-10-CM

## 2022-05-23 HISTORY — DX: Solitary pulmonary nodule: R91.1

## 2022-05-23 MED ORDER — VALSARTAN 80 MG PO TABS: 80.0000 mg | ORAL_TABLET | Freq: Every day | ORAL | 3 refills | Status: DC

## 2022-05-23 NOTE — Assessment & Plan Note (Signed)
Mild worsening due to grieving with the holiday season Clonazepam didn't help her when upset Will continue the venlafaxine 225 daily Travelling to be with family this CHristmas---hopefully this will help

## 2022-05-23 NOTE — Assessment & Plan Note (Signed)
BP Readings from Last 3 Encounters:  05/23/22 (!) 152/86  05/20/22 (!) 172/79  11/04/21 122/80   Repeat 160/100 on right Clearly upset now and HR up---so this may be secondary with her grieving Given other medical conditions, will start valsartan 80

## 2022-05-23 NOTE — Assessment & Plan Note (Signed)
Suspicious but may be related to atelectasis Will be conservative--just recheck CT scan in 3 months

## 2022-05-23 NOTE — Progress Notes (Addendum)
Chronic Care Management Pharmacy Assistant   Name: Wendy Murray  MRN: [id number] DOB: August 09, 1935  Reason for Encounter: Non-CCM [facility] Follow-Up)  Medications: Outpatient Encounter Medications as of 05/23/2022  Medication Sig   albuterol (VENTOLIN HFA) 108 (90 Base) MCG/ACT inhaler Inhale 2 puffs into the lungs every 6 (six) hours as needed for wheezing or shortness of breath.   aspirin 81 MG EC tablet Take 1 tablet (81 mg total) by mouth daily. Swallow whole.   atorvastatin (LIPITOR) 40 MG tablet Take 1 tablet (40 mg total) by mouth every evening.   levothyroxine (SYNTHROID) 75 MCG tablet TAKE ONE TABLET BY MOUTH DAILY BEFORE BREAKFAST   mirtazapine (REMERON) 15 MG tablet Take 1 tablet (15 mg total) by mouth at bedtime.   omeprazole (PRILOSEC) 20 MG capsule Take 1 capsule (20 mg total) by mouth every evening.   Probiotic Product (ALIGN) 4 MG CAPS Take 1 capsule by mouth at bedtime.   tiZANidine (ZANAFLEX) 4 MG tablet Take 1 tablet (4 mg total) by mouth every 8 (eight) hours as needed for muscle spasms.   valsartan (DIOVAN) 80 MG tablet Take 1 tablet (80 mg total) by mouth daily.   venlafaxine XR (EFFEXOR-XR) 150 MG 24 hr capsule TAKE ONE CAPSULE BY MOUTH DAILY ALONG WITH '75MG'$  CAPSULE   venlafaxine XR (EFFEXOR-XR) 75 MG 24 hr capsule TAKE ONE CAPSULE BY MOUTH DAILY ALONG WITH '150MG'$  CAPSULE   No facility-administered encounter medications on file as of 05/23/2022.    Reviewed hospital notes for details of recent visit. Has patient been contacted by Transitions of Care team? No Has patient seen PCP/specialist for hospital follow up (summarize OV if yes): Yes 05/23/2022 [name], MD  HTN Start: Valsartan 80 mg Stop (patient): Clonazepam 1 mg Nodule of left lung: "Suspicious but may be related to atelectasis Will be conservative--just recheck CT scan in 3 month" CHF: "Slight edema--but still compensated. Given her BP--will start valsartan 80. Continue asa"  Admitted  to the ED on 05/20/2022. Discharge date was 05/20/2022.  Discharged from [facility].   Discharge diagnosis (Principal Problem): Pulmonary nodule  Patient was discharged to Home  Brief summary of hospital course: Patient's presentation is most consistent with acute presentation with potential threat to life or bodily function.   Differential diagnosis includes, but is not limited to, pneumonia CHF pulmonary embolism anemia electrode abnormality vertigo orthostatic hypotension vasovagal syncope posterior stroke   Patient is an 86 year old female who presents with very vague symptoms of feeling dizzy and abnormal in her head.  She has difficulty describing the exact nature of the symptoms but it started today she denies any pain associated denies frank vertiginous symptoms or lightheadedness/presyncope but says she feels like if she were to get up that she would fall although she did not actually fall.  Denies visual change numbness tingling or weakness.  She is hypertensive which is new for her she has no prior history of hypertension.  Denies associated chest pain. Does have about a year of dyspnea and this feels worse over the last day. She is satting 94% on room air.  Exam is reassuring neurologic exam is nonfocal I have low suspicion for posterior stroke she is able to ambulate has no ataxia and is minimally symptomatic with ambulation.  Patient's EKG does have some nonspecific ST depression but it looks similar to prior.  Will check a troponin.  BNP is negative labs including CBC and CMP are reassuring. Given this dizziness and shortness of breath  with borderline hypoxia will obtain D-dimer to screen for pulmonary embolism.   D-dimer is positive.  CTA obtained is negative for bony or embolism.  There is some note of pulmonary nodule which I discussed with the patient need for follow-up in 3 months with repeat CT. we discussed daily check of her blood pressure and following up with PCP  to discuss blood pressure management.   Medications that remain the same after Hospital Discharge:??  -All other medications will remain the same.    Next CCM appt: Non-CCM  Other upcoming appts: PCP appointment on 06/23/2022  Charlene Brooke, PharmD notified and will determine if action is needed.  Marijean Niemann, [place] Pharmacy Assistant [phone]   Pharmacist addendum: Pt has seen PCP. No PharmD intervention required.  Charlene Brooke, PharmD, BCACP 06/06/22 11:00 AM

## 2022-05-23 NOTE — Assessment & Plan Note (Signed)
Slight edema--but still compensated Given her BP--will start valsartan 80 Continue asa

## 2022-05-23 NOTE — Progress Notes (Signed)
Subjective:    Patient ID: Wendy Murray, female    DOB: 06-Apr-1936, 86 y.o.   MRN: [id number]  HPI Here for ER follow up  Not sure what is going on--sudden bad feelings Still has sensation that she had a lot of caffeine Didn't sleep last night Didn't take the clonazepam Head not spinning but has dizzy feeling  BP had been elevated Went to ER 3 days ago due to dizziness and BP 180/100 by Merlene Morse RN No chest pain or SOB No edema Feels weak  Did have eyes checked by Dr Skeet Latch to a floater No problems on exam  No aphasia or dysphagia No focal weakness  Having a hard time again with grieving this holiday season Recent grieving group didn't work well for her  Current Outpatient Medications on File Prior to Visit  Medication Sig Dispense Refill   albuterol (VENTOLIN HFA) 108 (90 Base) MCG/ACT inhaler Inhale 2 puffs into the lungs every 6 (six) hours as needed for wheezing or shortness of breath. 8 g 2   aspirin 81 MG EC tablet Take 1 tablet (81 mg total) by mouth daily. Swallow whole. 90 tablet 3   atorvastatin (LIPITOR) 40 MG tablet Take 1 tablet (40 mg total) by mouth every evening. 90 tablet 3   levothyroxine (SYNTHROID) 75 MCG tablet TAKE ONE TABLET BY MOUTH DAILY BEFORE BREAKFAST 90 tablet 3   mirtazapine (REMERON) 15 MG tablet Take 1 tablet (15 mg total) by mouth at bedtime. 90 tablet 3   omeprazole (PRILOSEC) 20 MG capsule Take 1 capsule (20 mg total) by mouth every evening. 90 capsule 3   Probiotic Product (ALIGN) 4 MG CAPS Take 1 capsule by mouth at bedtime.     tiZANidine (ZANAFLEX) 4 MG tablet Take 1 tablet (4 mg total) by mouth every 8 (eight) hours as needed for muscle spasms. 60 tablet 1   venlafaxine XR (EFFEXOR-XR) 150 MG 24 hr capsule TAKE ONE CAPSULE BY MOUTH DAILY ALONG WITH '75MG'$  CAPSULE 90 capsule 3   venlafaxine XR (EFFEXOR-XR) 75 MG 24 hr capsule TAKE ONE CAPSULE BY MOUTH DAILY ALONG WITH '150MG'$  CAPSULE 90 capsule 3   No current  facility-administered medications on file prior to visit.    No Known Allergies  Past Medical History:  Diagnosis Date   CHF (congestive heart failure) ([place]) 3491   diastolic   [id number] 97/94/8016   Diverticulosis    DVT (deep venous thrombosis) ([place]) 06/16/2010   after TKR   Hyperlipidemia    Hyperparathyroidism ([place])    Hypothyroidism    IBS (irritable bowel syndrome)    Inflammatory arthritis    Osteoarthritis, knee    Recurrent major depression (HCC)    TIA (transient ischemic attack) 01/2021    Past Surgical History:  Procedure Laterality Date   ABDOMINAL HYSTERECTOMY  06/16/1989   CATARACT EXTRACTION W/ INTRAOCULAR LENS  IMPLANT, BILATERAL  06/16/2012   CHOLECYSTECTOMY  06/16/1996   COLONOSCOPY     DEXA  08/15/2007   Normal   HAMMER TOE SURGERY Left 10/15/2014   Dr Barkley Bruns   LUMBAR FUSION  06/17/1991   MENISCUS REPAIR Left 04/16/2010   TOTAL KNEE ARTHROPLASTY Left 06/16/2010   TOTAL KNEE ARTHROPLASTY Right 07/09/2021   Procedure: TOTAL KNEE ARTHROPLASTY;  Surgeon: Corky Mull, MD;  Location: [facility] ORS;  Service: Orthopedics;  Laterality: Right;    Family History  Problem Relation Age of Onset   Cancer Mother        colon   Depression Mother  Cancer Father    Depression Sister    Hypertension Neg Hx    Heart disease Neg Hx    Diabetes Neg Hx     Social History   Socioeconomic History   Marital status: Widowed    Spouse name: Not on file   Number of children: 3   Years of education: Not on file   Highest education level: Not on file  Occupational History   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    Comment: Retired  Tobacco Use   Smoking status: Former    Types: Cigarettes    Quit date: 06/16/1981    Years since quitting: 40.9    Passive exposure: Past (as a child)   Smokeless tobacco: Never  Vaping Use   Vaping Use: Never used  Substance and Sexual Activity   Alcohol use: Yes    Alcohol/week: 0.0 standard drinks of alcohol     Comment: rarely   Drug use: No   Sexual activity: Not Currently  Other Topics Concern   Not on file  Social History Narrative   [place]   Widowed 1/23      Has living will    Daughter Joycelyn Schmid, have health care POA   Has DNR ---order rewritten   No feeding tube if cognitively unaware   Will be donating body to [place] Determinants of Health   Financial Resource Strain: Not on file  Food Insecurity: Not on file  Transportation Needs: Not on file  Physical Activity: Not on file  Stress: Not on file  Social Connections: Not on file  Intimate Partner Violence: Not on file   Review of Systems Eating okay Not sleeping well in the past week    Objective:   Physical Exam Constitutional:      Appearance: Normal appearance.  Cardiovascular:     Rate and Rhythm: Normal rate and regular rhythm.     Heart sounds: No murmur heard.    No gallop.  Pulmonary:     Effort: Pulmonary effort is normal.     Breath sounds: Normal breath sounds. No wheezing or rales.  Musculoskeletal:     Cervical back: Neck supple.     Comments: Mild non pitting foot/ankle edema  Lymphadenopathy:     Cervical: No cervical adenopathy.  Neurological:     Mental Status: She is alert.  Psychiatric:     Comments: Slightly upset telling about recent grieving group experience            Assessment & Plan:

## 2022-05-23 NOTE — Assessment & Plan Note (Signed)
No new neuro symptoms Still on ASA

## 2022-05-28 ENCOUNTER — Telehealth: Payer: Self-pay | Admitting: Internal Medicine

## 2022-05-28 NOTE — Telephone Encounter (Signed)
Pt called in; pt was seen on 05/23/22 and was started on valsartan 80 mg taking one daily. Pt said she continues with consistent lightheadedness that started on 05/20/2022. Pt advised to get up slowly and precautions so pt will not fall. 05/28/22 now pts BP 152/85 P 83. Pt said she takes her BP daily and everyday averaging 150 something over mid 80s. Pt wonders if could change BP med. Pt said no change in BP since taking Valsartan. Pt said she feels like she has adrenalin in body like an elevated stage fright feeling.  Pt does not have CP,SOB, or new vision changes; pt has H/A on and off but no H/A now. Pt request cb after note reviewed by Dr Silvio Pate. [place]. UC & ED precautions given and pt voiced understanding. Will send note to Dr Silvio Pate and Silvio Pate pool.

## 2022-05-28 NOTE — Telephone Encounter (Signed)
Wendy Carbon, MD  to Me  Wendy Murray     05/28/22  1:04 PM  Please let her know that it takes a while (several weeks) to see the full effect of a new blood pressure pill so the 150/80's is not bad for now. I am not sure about the feeling she has--but I don't think it is from that level of BP elevation    Pt notified as instructed and pt voiced understanding but pt said she forgot to let Dr Wendy Pate know that she is flying out of [place] on 06/03/22 to [place] for 1 wk and then flying back home. Pt wonders if any suggestion about lightheadedness and the feeling of being on alert like a stage fright feeling. Pt request cb after Dr Wendy Pate has reviewed the note. Offered pt an appt but pt said to just send message. Sending note to Dr Wendy Pate.

## 2022-05-28 NOTE — Telephone Encounter (Signed)
If it is a motion sickness feeling, meclizine 12.5-25 mg can help. Otherwise, does she think she needs a mild tranquilizer?   When performed musically years ago pt took beta blockers for stage fright. Pt does not think this is a motion sickness feeling or issue.  Pt is open for mild tranquilizer if Dr Silvio Pate thinks that might help. [facility] [address]. Pt request cb after Dr Silvio Pate reviews note. Sending to Dr Silvio Pate.

## 2022-05-29 ENCOUNTER — Telehealth: Payer: Self-pay

## 2022-05-29 MED ORDER — ALPRAZOLAM 0.25 MG PO TABS: 0.1250 mg | ORAL_TABLET | Freq: Three times a day (TID) | ORAL | 0 refills | Status: DC | PRN

## 2022-05-29 NOTE — Telephone Encounter (Signed)
Wendy Carbon, MD  to Me  Kathlyn Sacramento     05/29/22  7:48 AM  She has clonazepam that I think she just uses at night. If that is not too sedating, she can take 1/2 of that for the travel to calm her nerves. If she thinks that is too sedating, I can send a small amount of xanax for the flights   Pt notified as instructed and pt voiced understanding and  pt is requesting xanax for flights [facility] to [facility] [address]. Pt very appreciative to Dr Silvio Pate. Sending note to Dr Silvio Pate.

## 2022-05-29 NOTE — Telephone Encounter (Addendum)
Venia Carbon, MD  to Me     05/29/22 10:07 AM  Okay Xanax sent She should try just 1/2 tab first---but okay to take full tab if needed   Pt notified as instructed and pt voiced understanding. Pt will cb if anything further needed.

## 2022-05-29 NOTE — Telephone Encounter (Signed)
        Patient  visited [facility] on 12/5   Telephone encounter attempt : 1st   Unable to leave a message due to a call blocker   [place], [facility]  [phone] [address], [place], [place] [address] Phone: [phone] Email: [email or web address]

## 2022-06-02 ENCOUNTER — Telehealth: Payer: Self-pay

## 2022-06-02 NOTE — Telephone Encounter (Signed)
     Patient  visit on 12/5  at [facility]    Have you been able to follow up with your primary care physician? Yes   The patient was or was not able to obtain any needed medicine or equipment. Yes   Are there diet recommendations that you are having difficulty following? NA   Patient expresses understanding of discharge instructions and education provided has no other needs at this time.  Yes     [place], [facility], Care Management  [phone] [address], [place], [place] [address] Phone: [phone] Email: [email or web address]

## 2022-06-23 ENCOUNTER — Ambulatory Visit: Payer: Medicare Other | Admitting: Internal Medicine

## 2022-06-30 ENCOUNTER — Ambulatory Visit: Payer: Medicare Other | Admitting: Internal Medicine

## 2022-07-10 ENCOUNTER — Encounter: Payer: Self-pay | Admitting: Internal Medicine

## 2022-07-10 ENCOUNTER — Ambulatory Visit (INDEPENDENT_AMBULATORY_CARE_PROVIDER_SITE_OTHER): Payer: Medicare Other | Admitting: Internal Medicine

## 2022-07-10 VITALS — BP 132/88 | HR 102 | Temp 98.0°F | Ht 65.0 in | Wt 183.0 lb

## 2022-07-10 DIAGNOSIS — R911 Solitary pulmonary nodule: Secondary | ICD-10-CM | POA: Diagnosis not present

## 2022-07-10 DIAGNOSIS — I10 Essential (primary) hypertension: Secondary | ICD-10-CM | POA: Diagnosis not present

## 2022-07-10 DIAGNOSIS — F3341 Major depressive disorder, recurrent, in partial remission: Secondary | ICD-10-CM

## 2022-07-10 MED ORDER — CLONAZEPAM 1 MG PO TABS: 1.0000 mg | ORAL_TABLET | Freq: Every evening | ORAL | 0 refills | Status: DC | PRN

## 2022-07-10 NOTE — Assessment & Plan Note (Signed)
BP Readings from Last 3 Encounters:  07/10/22 132/88  05/23/22 (!) 152/86  05/20/22 (!) 172/79   Tolerating the valsartan '80mg'$  and BP is better She will monitor at home Check renal profile

## 2022-07-10 NOTE — Assessment & Plan Note (Signed)
Will plan repeat CT in a few months

## 2022-07-10 NOTE — Progress Notes (Signed)
Subjective:    Patient ID: Wendy Murray, female    DOB: 10-28-1935, 87 y.o.   MRN: [id number]  HPI Here for follow up of HTN and other issues  Did travel to [place] to daughter's place Easy in airport using wheelchair Nice to be with family But then got call from nephew that her sister was sick---flew down to [place], thought she was dying Then she perked up --at nursing facility Now had [facility] come over to discuss the required 10 year remodel (and won't be able to keep her dog--so she is deferring). Got angry about this---and then also wouldn't put in floors she wanted  No problems with blood pressure med Hasn't checked it recently The "adrenaline feeling" has been gone No chest pain or SOB  Current Outpatient Medications on File Prior to Visit  Medication Sig Dispense Refill   albuterol (VENTOLIN HFA) 108 (90 Base) MCG/ACT inhaler Inhale 2 puffs into the lungs every 6 (six) hours as needed for wheezing or shortness of breath. 8 g 2   aspirin 81 MG EC tablet Take 1 tablet (81 mg total) by mouth daily. Swallow whole. 90 tablet 3   atorvastatin (LIPITOR) 40 MG tablet Take 1 tablet (40 mg total) by mouth every evening. 90 tablet 3   levothyroxine (SYNTHROID) 75 MCG tablet TAKE ONE TABLET BY MOUTH DAILY BEFORE BREAKFAST 90 tablet 3   mirtazapine (REMERON) 15 MG tablet Take 1 tablet (15 mg total) by mouth at bedtime. 90 tablet 3   omeprazole (PRILOSEC) 20 MG capsule Take 1 capsule (20 mg total) by mouth every evening. 90 capsule 3   Probiotic Product (ALIGN) 4 MG CAPS Take 1 capsule by mouth at bedtime.     tiZANidine (ZANAFLEX) 4 MG tablet Take 1 tablet (4 mg total) by mouth every 8 (eight) hours as needed for muscle spasms. 60 tablet 1   valsartan (DIOVAN) 80 MG tablet Take 1 tablet (80 mg total) by mouth daily. 90 tablet 3   venlafaxine XR (EFFEXOR-XR) 150 MG 24 hr capsule TAKE ONE CAPSULE BY MOUTH DAILY ALONG WITH '75MG'$  CAPSULE 90 capsule 3   venlafaxine XR (EFFEXOR-XR)  75 MG 24 hr capsule TAKE ONE CAPSULE BY MOUTH DAILY ALONG WITH '150MG'$  CAPSULE 90 capsule 3   No current facility-administered medications on file prior to visit.    No Known Allergies  Past Medical History:  Diagnosis Date   CHF (congestive heart failure) ([place]) 3382   diastolic   [id number] 73/41/9379   Diverticulosis    DVT (deep venous thrombosis) ([place]) 06/16/2010   after TKR   Hyperlipidemia    Hyperparathyroidism ([place])    Hypothyroidism    IBS (irritable bowel syndrome)    Inflammatory arthritis    Osteoarthritis, knee    Recurrent major depression (HCC)    TIA (transient ischemic attack) 01/2021    Past Surgical History:  Procedure Laterality Date   ABDOMINAL HYSTERECTOMY  06/16/1989   CATARACT EXTRACTION W/ INTRAOCULAR LENS  IMPLANT, BILATERAL  06/16/2012   CHOLECYSTECTOMY  06/16/1996   COLONOSCOPY     DEXA  08/15/2007   Normal   HAMMER TOE SURGERY Left 10/15/2014   Dr Barkley Bruns   LUMBAR FUSION  06/17/1991   MENISCUS REPAIR Left 04/16/2010   TOTAL KNEE ARTHROPLASTY Left 06/16/2010   TOTAL KNEE ARTHROPLASTY Right 07/09/2021   Procedure: TOTAL KNEE ARTHROPLASTY;  Surgeon: Corky Mull, MD;  Location: [facility] ORS;  Service: Orthopedics;  Laterality: Right;    Family History  Problem Relation  Age of Onset   Cancer Mother        colon   Depression Mother    Cancer Father    Depression Sister    Hypertension Neg Hx    Heart disease Neg Hx    Diabetes Neg Hx     Social History   Socioeconomic History   Marital status: Widowed    Spouse name: Not on file   Number of children: 3   Years of education: Not on file   Highest education level: Not on file  Occupational History   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    Comment: Retired  Tobacco Use   Smoking status: Former    Types: Cigarettes    Quit date: 06/16/1981    Years since quitting: 41.0    Passive exposure: Past (as a child)   Smokeless tobacco: Never  Vaping Use   Vaping Use: Never used   Substance and Sexual Activity   Alcohol use: Yes    Alcohol/week: 0.0 standard drinks of alcohol    Comment: rarely   Drug use: No   Sexual activity: Not Currently  Other Topics Concern   Not on file  Social History Narrative   [place] in [place]   Widowed 1/23      Has living will    Daughter Joycelyn Schmid, have health care POA   Has DNR ---order rewritten   No feeding tube if cognitively unaware   Will be donating body to [place] Determinants of Health   Financial Resource Strain: Not on file  Food Insecurity: Not on file  Transportation Needs: Not on file  Physical Activity: Not on file  Stress: Not on file  Social Connections: Not on file  Intimate Partner Violence: Not on file   Review of Systems Sleeping is been tough---using the clonazepam again Eating okay     Objective:   Physical Exam Constitutional:      Appearance: Normal appearance.  Cardiovascular:     Rate and Rhythm: Normal rate and regular rhythm.     Heart sounds: No murmur heard.    No gallop.  Pulmonary:     Effort: Pulmonary effort is normal.     Breath sounds: Normal breath sounds. No wheezing or rales.  Musculoskeletal:     Cervical back: Neck supple.     Right lower leg: No edema.     Left lower leg: No edema.  Lymphadenopathy:     Cervical: No cervical adenopathy.  Neurological:     Mental Status: She is alert.            Assessment & Plan:

## 2022-07-10 NOTE — Assessment & Plan Note (Signed)
Some upsetting things over the holidatys (though sister was dying, etc) Doing okay now with the venlafaxine 225 daily Clonazepam prn for sleep

## 2022-07-11 LAB — RENAL FUNCTION PANEL
Albumin: 4.4 g/dL (ref 3.5–5.2)
BUN: 13 mg/dL (ref 6–23)
CO2: 26 mEq/L (ref 19–32)
Calcium: 10.7 mg/dL — ABNORMAL HIGH (ref 8.4–10.5)
Chloride: 102 mEq/L (ref 96–112)
Creatinine, Ser: 0.87 mg/dL (ref 0.40–1.20)
GFR: 60.94 mL/min (ref >60.00)
Glucose, Bld: 103 mg/dL — ABNORMAL HIGH (ref 70–99)
Phosphorus: 2.9 mg/dL (ref 2.3–4.6)
Potassium: 4.2 mEq/L (ref 3.5–5.1)
Sodium: 137 mEq/L (ref 135–145)

## 2022-08-27 ENCOUNTER — Other Ambulatory Visit: Payer: Self-pay | Admitting: Internal Medicine

## 2022-08-27 ENCOUNTER — Encounter: Payer: Self-pay | Admitting: Internal Medicine

## 2022-08-27 MED ORDER — AMOXICILLIN 500 MG PO TABS: 2000.0000 mg | ORAL_TABLET | Freq: Every day | ORAL | 0 refills | Status: DC

## 2022-08-27 MED ORDER — CLONAZEPAM 1 MG PO TABS: 0.5000 mg | ORAL_TABLET | Freq: Every evening | ORAL | 0 refills | Status: DC | PRN

## 2022-10-08 IMAGING — MR MR HEAD W/O CM
16 series · 48 of 48 positions shown · non-contrast
Comparison: Prior CT from 01/22/2021 and MRI from 06/12/2015.

CLINICAL DATA: Initial evaluation for acute TIA.

EXAM:
MRI HEAD WITHOUT CONTRAST
TECHNIQUE: Multiplanar, multiecho pulse sequences of the brain and surrounding
structures were obtained without intravenous contrast.

[Series 5: ax dwi_tracew · axial · 3.0mm · 0.65mm/px · z∈[-1,+159]mm · 2 of 50 slices shown]
[im 1/50]
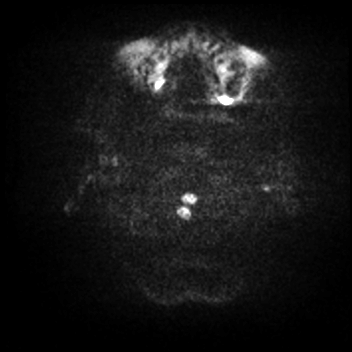
[im 50/50]
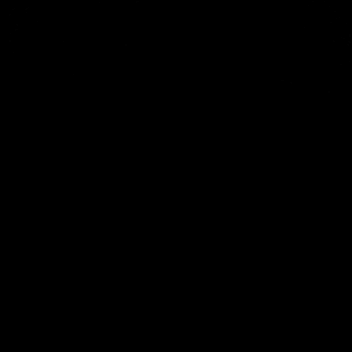

[Series 6: ax dwi_adc · axial · 3.0mm · 0.65mm/px · z∈[-1,+143]mm · 2 of 45 slices shown]
[im 1/45]
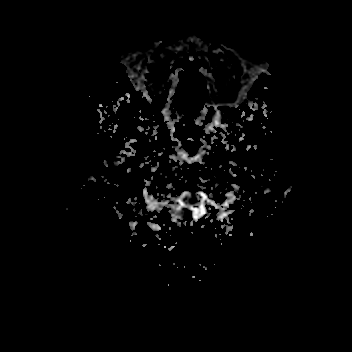
[im 45/45]
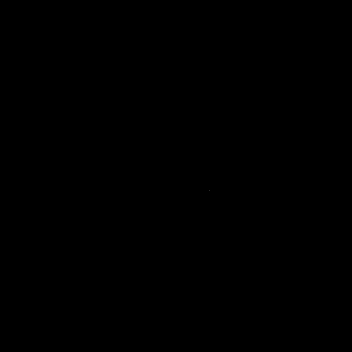

[Series 7: cor dwi_tracew · coronal · 5.0mm · 0.65mm/px · 4 of 76 slices shown]
[im 1/76]
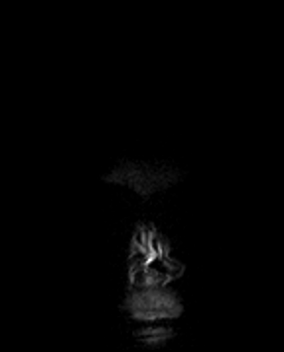
[im 26/76]
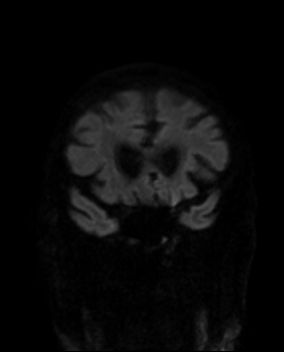
[im 51/76]
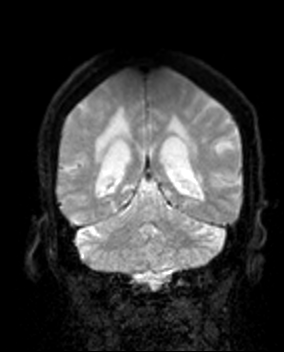
[im 76/76]
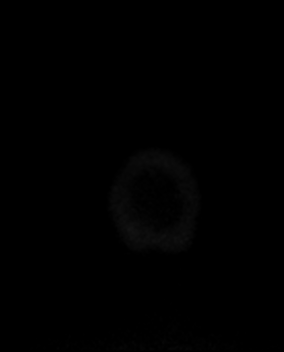

[Series 8: cor dwi_adc · coronal · 5.0mm · 0.65mm/px · 2 of 37 slices shown]
[im 1/37]
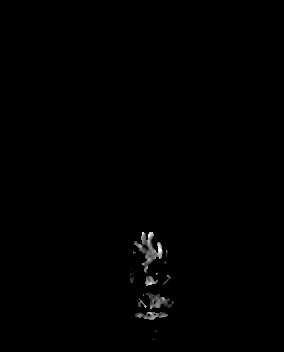
[im 37/37]
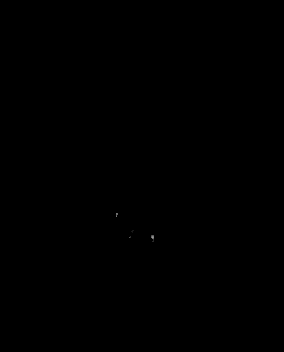

[Series 9: T2 · axial · 5.0mm · 0.53mm/px · z∈[-10,+156]mm · 2 of 29 slices shown (1 of 2)]
[im 1/29]
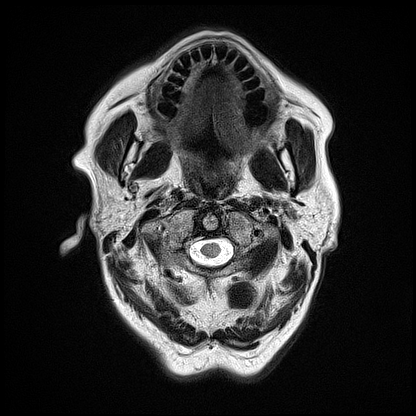
[im 29/29]
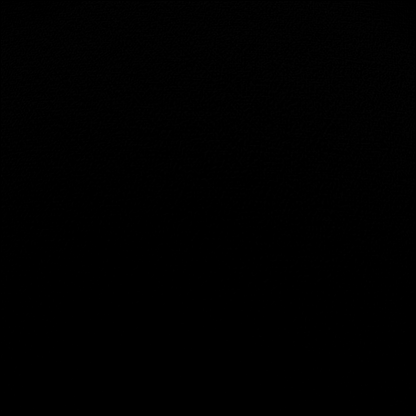

[Series 10: T1 · sagittal · 5.0mm · 0.62mm/px · 1 of 25 slices shown (1 of 2)]
[im 1/25]
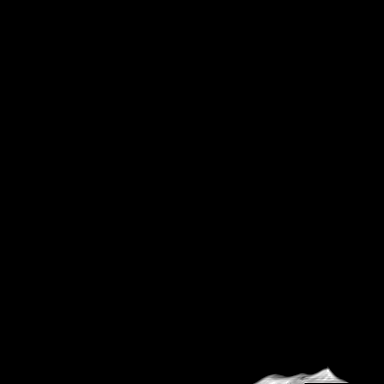

[Series 11: mag_images · axial · 3.0mm · 0.90mm/px · z∈[-15,+160]mm · 3 of 60 slices shown (1 of 2)]
[im 1/60]
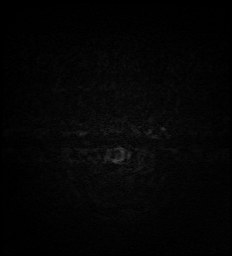
[im 30/60]
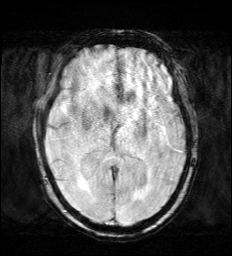
[im 60/60]
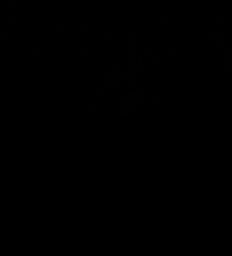

[Series 13: swi_images · axial · 3.0mm · 0.90mm/px · z∈[-15,+160]mm · 3 of 60 slices shown (1 of 2)]
[im 1/60]
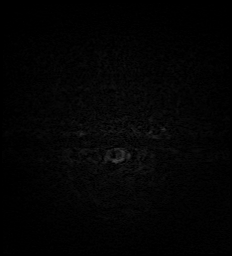
[im 30/60]
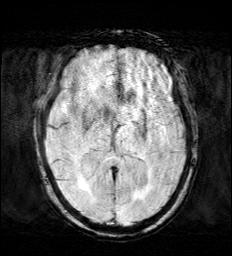
[im 60/60]
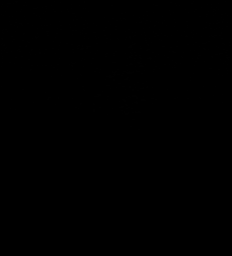

[Series 14: mip_images(sw) · axial · 24.0mm · 0.90mm/px · z∈[-5,+149]mm · 3 of 53 slices shown (1 of 2)]
[im 1/53]
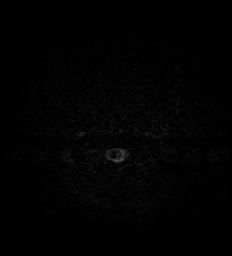
[im 27/53]
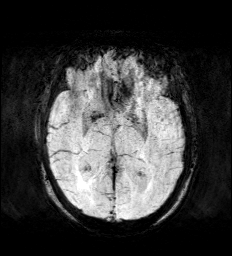
[im 53/53]
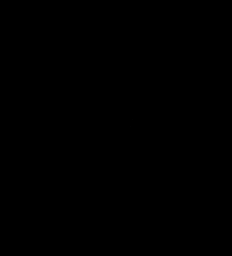

[Series 15: FLAIR · axial · 3.0mm · 0.55mm/px · z∈[-13,+147]mm · 3 of 55 slices shown]
[im 1/55]
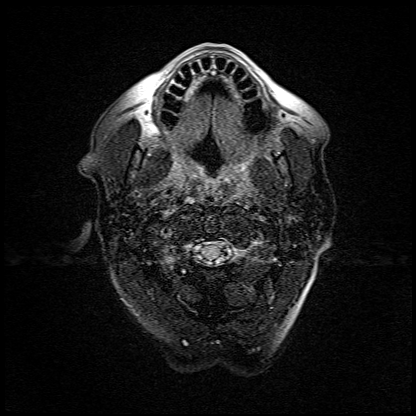
[im 28/55]
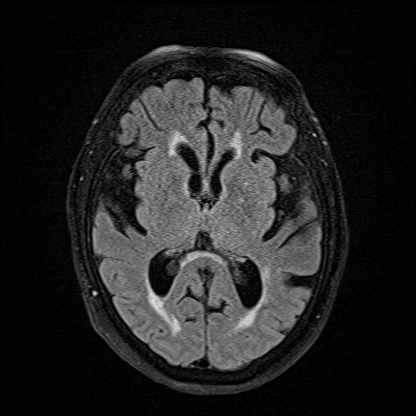
[im 55/55]
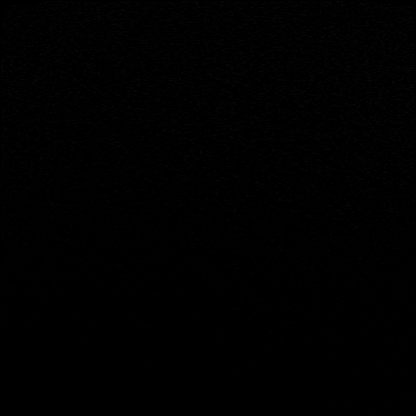

[Series 16: T1 · axial · 1.0mm · 0.98mm/px · z∈[-18,+149]mm · 9 of 166 slices shown (2 of 2)]
[im 1/166]
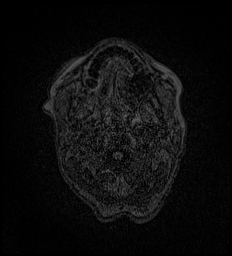
[im 21/166]
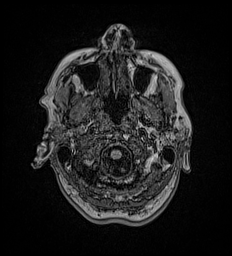
[im 42/166]
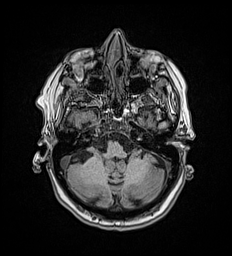
[im 62/166]
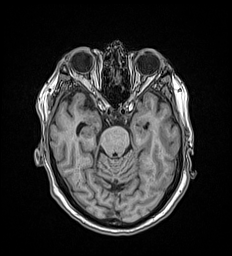
[im 83/166]
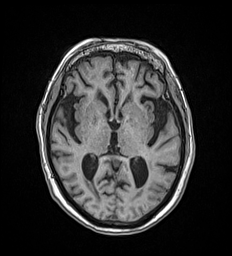
[im 104/166]
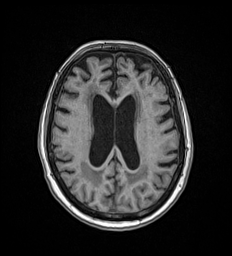
[im 124/166]
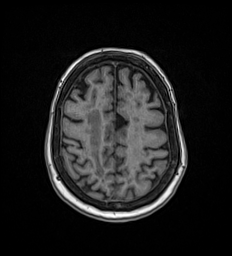
[im 145/166]
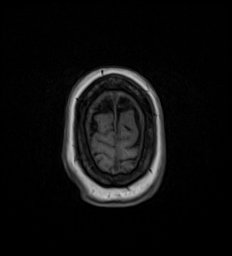
[im 166/166]
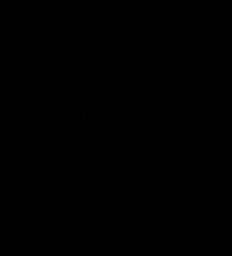

[Series 22: T2 · coronal · 5.0mm · 0.57mm/px · 2 of 29 slices shown (2 of 2)]
[im 1/29]
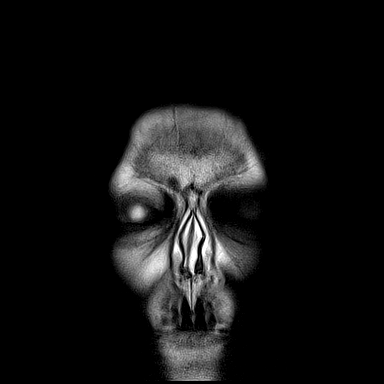
[im 29/29]
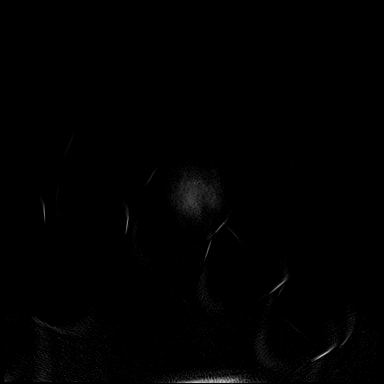

[Series 23: mag_images · axial · 3.0mm · 0.90mm/px · z∈[-22,+153]mm · 3 of 60 slices shown (2 of 2)]
[im 1/60]
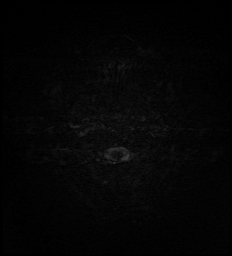
[im 30/60]
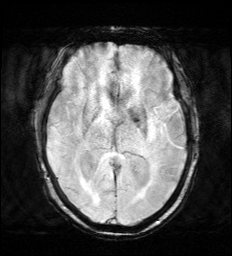
[im 60/60]
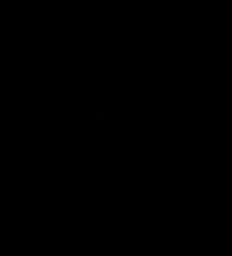

[Series 24: pha_images · axial · 3.0mm · 0.90mm/px · z∈[-16,+147]mm · 3 of 56 slices shown]
[im 1/56]
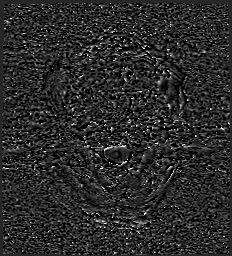
[im 28/56]
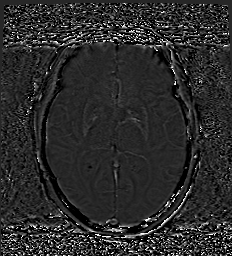
[im 56/56]
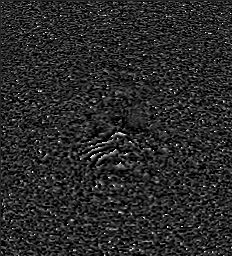

[Series 25: swi_images · axial · 3.0mm · 0.90mm/px · z∈[-22,+153]mm · 3 of 60 slices shown (2 of 2)]
[im 1/60]
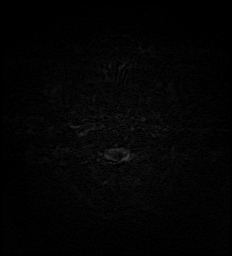
[im 30/60]
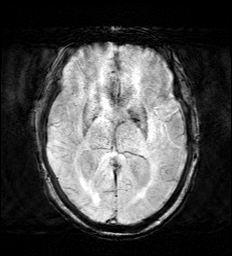
[im 60/60]
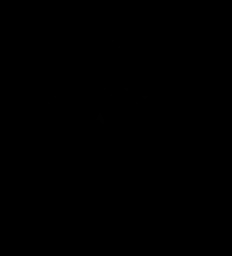

[Series 26: mip_images(sw) · axial · 24.0mm · 0.90mm/px · z∈[-11,+143]mm · 3 of 53 slices shown (2 of 2)]
[im 1/53]
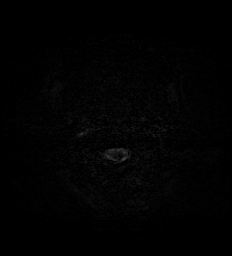
[im 27/53]
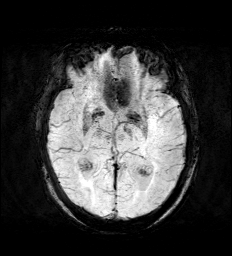
[im 53/53]
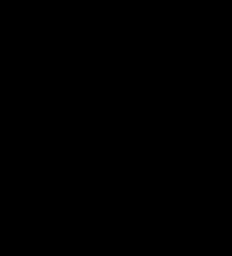

[48 of 48 positions shown; findings below may reference images not displayed]

FINDINGS: Brain: Examination mildly degraded by motion artifact.

Generalized age-related cerebral atrophy. Patchy and confluent
T2/FLAIR hyperintensity within the periventricular and deep white
matter both cerebral hemispheres most consistent with chronic small
vessel ischemic disease, moderate in nature.

No convincing foci of restricted diffusion to suggest acute or
subacute ischemia. Subtle apparent diffusion signal abnormality
along the margin of the splenium on axial DWI sequence noted, not
seen on corresponding sequences, favored to be artifactual in
nature. Gray-white matter differentiation maintained. No
encephalomalacia to suggest chronic cortical infarction. No foci of
susceptibility artifact to suggest acute or chronic intracranial
hemorrhage.

1 cm calcified meningioma at the posterior aspect of the foramen
magnum without mass effect, similar to previous (series 9, image 2).
No other mass lesion, mass effect or midline shift. Mild ventricular
prominence related to global parenchymal volume loss without
hydrocephalus. No extra-axial fluid collection. Pituitary gland
suprasellar region within normal limits. Midline structures intact.

Vascular: Major intracranial vascular flow voids are maintained.

Skull and upper cervical spine: Craniocervical junction otherwise
unremarkable. Bone marrow signal intensity within normal limits. No
scalp soft tissue abnormality.

Sinuses/Orbits: Patient status post bilateral ocular lens
replacement. Globes and orbital soft tissues demonstrate no acute
finding. Mild scattered mucosal thickening noted within the
ethmoidal air cells and maxillary sinuses. Paranasal sinuses are
otherwise clear. No mastoid effusion. Inner ear structures grossly
normal.

Other: None.
IMPRESSION: 1. No acute intracranial abnormality.
2. Generalized age-related cerebral atrophy with moderate chronic
microvascular ischemic disease.
3. 1 cm calcified meningioma at the posterior aspect of the foramen
magnum without mass affect.

## 2022-10-14 ENCOUNTER — Telehealth: Payer: Self-pay

## 2022-10-14 NOTE — Telephone Encounter (Signed)
Patient was seen at dentist today and her blood pressure was 181/90. She denies any symptoms. Dentist declined to do procedure due to blood pressure and requested patient call office to let PCP know. Patient was started Valsartan about 6 months ago for HTN. She has not missed any doses of meds. Denies any new medications not on list. Denies any current Headache, chest pain, sob, changes in vision. Current reading after sitting for 5 minuets at home is 142/92 hr 89. Set patient up in office tomorrow at 11:30. Advised to bring bp cuff with her to visit. Also reviewed all red words and ED precautions with patient. Repeated back to me and agreed if any will get seen in ED.

## 2022-10-15 ENCOUNTER — Encounter: Payer: Self-pay | Admitting: Internal Medicine

## 2022-10-15 ENCOUNTER — Ambulatory Visit (INDEPENDENT_AMBULATORY_CARE_PROVIDER_SITE_OTHER): Payer: Medicare Other | Admitting: Internal Medicine

## 2022-10-15 VITALS — BP 138/88 | HR 99 | Temp 97.4°F | Ht 65.0 in | Wt 178.0 lb

## 2022-10-15 DIAGNOSIS — I10 Essential (primary) hypertension: Secondary | ICD-10-CM

## 2022-10-15 MED ORDER — VALSARTAN 80 MG PO TABS: 120.0000 mg | ORAL_TABLET | Freq: Every day | ORAL | 3 refills | Status: DC

## 2022-10-15 NOTE — Progress Notes (Signed)
Subjective:    Patient ID: Wendy Murray, female    DOB: 24-Apr-1936, 87 y.o.   MRN: [id number]  HPI Here due to elevated blood pressure  Went to dentist yesterday 170s/90s Was supposed to have filling with lidocaine--and it was deferred No symptoms--no headache, chest pain, SOB  She checks it rarely Down to 147/95 at home this morning  Current Outpatient Medications on File Prior to Visit  Medication Sig Dispense Refill   albuterol (VENTOLIN HFA) 108 (90 Base) MCG/ACT inhaler Inhale 2 puffs into the lungs every 6 (six) hours as needed for wheezing or shortness of breath. 8 g 2   amoxicillin (AMOXIL) 500 MG tablet Take 4 tablets (2,000 mg total) by mouth daily. 1 hour before dental appointment 20 tablet 0   aspirin 81 MG EC tablet Take 1 tablet (81 mg total) by mouth daily. Swallow whole. 90 tablet 3   atorvastatin (LIPITOR) 40 MG tablet TAKE 1 TABLET BY MOUTH EVERY EVENING 270 tablet 3   clonazePAM (KLONOPIN) 1 MG tablet Take 0.5-1 tablets (0.5-1 mg total) by mouth at bedtime as needed. 30 tablet 0   levothyroxine (SYNTHROID) 75 MCG tablet TAKE 1 TABLET BY MOUTH EVERY DAY 270 tablet 3   mirtazapine (REMERON) 15 MG tablet TAKE 1 TABLET BY MOUTH EVERY DAY AT BEDTIME 270 tablet 3   omeprazole (PRILOSEC) 20 MG capsule Take 1 capsule (20 mg total) by mouth every evening. 90 capsule 3   Probiotic Product (ALIGN) 4 MG CAPS Take 1 capsule by mouth at bedtime.     tiZANidine (ZANAFLEX) 4 MG tablet Take 1 tablet (4 mg total) by mouth every 8 (eight) hours as needed for muscle spasms. 60 tablet 1   valsartan (DIOVAN) 80 MG tablet Take 1 tablet (80 mg total) by mouth daily. 90 tablet 3   venlafaxine XR (EFFEXOR-XR) 150 MG 24 hr capsule TAKE 1 CAPSULE BY MOUTH EVERY DAY WITH 75 MG CAPSULE 270 capsule 3   venlafaxine XR (EFFEXOR-XR) 75 MG 24 hr capsule TAKE 1 CAPSULE BY MOUTH EVERY DAY ALONG WITH 150 MG CAPSULE 270 capsule 3   No current facility-administered medications on file prior to  visit.    No Known Allergies  Past Medical History:  Diagnosis Date   CHF (congestive heart failure) (HCC) 2022   diastolic   COVID-19 05/30/2021   Diverticulosis    DVT (deep venous thrombosis) (HCC) 06/16/2010   after TKR   Hyperlipidemia    Hyperparathyroidism (HCC)    Hypothyroidism    IBS (irritable bowel syndrome)    Inflammatory arthritis    Osteoarthritis, knee    Recurrent major depression (HCC)    TIA (transient ischemic attack) 01/2021    Past Surgical History:  Procedure Laterality Date   ABDOMINAL HYSTERECTOMY  06/16/1989   CATARACT EXTRACTION W/ INTRAOCULAR LENS  IMPLANT, BILATERAL  06/16/2012   CHOLECYSTECTOMY  06/16/1996   COLONOSCOPY     DEXA  08/15/2007   Normal   HAMMER TOE SURGERY Left 10/15/2014   Dr Elvin So   LUMBAR FUSION  06/17/1991   MENISCUS REPAIR Left 04/16/2010   TOTAL KNEE ARTHROPLASTY Left 06/16/2010   TOTAL KNEE ARTHROPLASTY Right 07/09/2021   Procedure: TOTAL KNEE ARTHROPLASTY;  Surgeon: Christena Flake, MD;  Location: [facility] ORS;  Service: Orthopedics;  Laterality: Right;    Family History  Problem Relation Age of Onset   Cancer Mother        colon   Depression Mother    Cancer Father  Depression Sister    Hypertension Neg Hx    Heart disease Neg Hx    Diabetes Neg Hx     Social History   Socioeconomic History   Marital status: Widowed    Spouse name: Not on file   Number of children: 3   Years of education: Not on file   Highest education level: Not on file  Occupational History   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    Comment: Retired  Tobacco Use   Smoking status: Former    Types: Cigarettes    Quit date: 06/16/1981    Years since quitting: 41.3    Passive exposure: Past (as a child)   Smokeless tobacco: Never  Vaping Use   Vaping Use: Never used  Substance and Sexual Activity   Alcohol use: Yes    Alcohol/week: 0.0 standard drinks of alcohol    Comment: rarely   Drug use: No   Sexual activity:  Not Currently  Other Topics Concern   Not on file  Social History Narrative   Children in [place], [place] and [place]   Widowed 1/23      Has living will    Daughter [name], have health care POA   Has DNR ---order rewritten   No feeding tube if cognitively unaware   Will be donating body to [employer]   Social Determinants of Health   Financial Resource Strain: Not on file  Food Insecurity: Not on file  Transportation Needs: Not on file  Physical Activity: Not on file  Stress: Not on file  Social Connections: Not on file  Intimate Partner Violence: Not on file   Review of Systems Not sleeping great---clonazepam 0.5 mg helps Eating okay Has taken ibuprofen--- thinks she even had it yesterday     Objective:   Physical Exam Constitutional:      Appearance: Normal appearance.  Cardiovascular:     Rate and Rhythm: Normal rate and regular rhythm.     Heart sounds: No murmur heard.    No gallop.  Pulmonary:     Effort: Pulmonary effort is normal.     Breath sounds: Normal breath sounds. No wheezing or rales.  Musculoskeletal:     Cervical back: Neck supple.     Right lower leg: No edema.     Left lower leg: No edema.  Lymphadenopathy:     Cervical: No cervical adenopathy.  Neurological:     Mental Status: She is alert.            Assessment & Plan:

## 2022-10-15 NOTE — Patient Instructions (Signed)
Please increase the valsartan to 1.5 tabs daily (120mg ). Monitor your blood pressure at least several times a week--and write down what you get. Have Janci also check it in 1-2 weeks. Watch for dizziness upon standing

## 2022-10-15 NOTE — Assessment & Plan Note (Signed)
BP Readings from Last 3 Encounters:  10/15/22 138/88  07/10/22 132/88  05/23/22 (!) 152/86   May have been up just situational with dental visit She does have systolics in 140's at home Discussed pros/cons of increased BP control Will try increasing the valsartan to 120 mg daily. She will monitor BP and watch for orthostatic dizziness.  Coming back in 3 weeks for regular visit

## 2022-10-15 NOTE — Telephone Encounter (Signed)
The dentist is the most common places for BP to be elevated---but it makes sense to check her today

## 2022-10-29 ENCOUNTER — Other Ambulatory Visit: Payer: Self-pay | Admitting: Internal Medicine

## 2022-10-29 NOTE — Telephone Encounter (Signed)
Refill request Clonazepam Last refill 08/27/22 #30 Last office visit 10/15/22

## 2022-10-30 MED ORDER — CLONAZEPAM 1 MG PO TABS: 0.5000 mg | ORAL_TABLET | Freq: Every evening | ORAL | 0 refills | Status: DC | PRN

## 2022-11-06 ENCOUNTER — Encounter: Payer: Self-pay | Admitting: Internal Medicine

## 2022-11-06 ENCOUNTER — Ambulatory Visit (INDEPENDENT_AMBULATORY_CARE_PROVIDER_SITE_OTHER): Payer: Medicare Other | Admitting: Internal Medicine

## 2022-11-06 VITALS — BP 138/82 | HR 101 | Temp 97.8°F | Ht 65.0 in | Wt 175.0 lb

## 2022-11-06 DIAGNOSIS — E039 Hypothyroidism, unspecified: Secondary | ICD-10-CM

## 2022-11-06 DIAGNOSIS — I10 Essential (primary) hypertension: Secondary | ICD-10-CM | POA: Diagnosis not present

## 2022-11-06 DIAGNOSIS — E213 Hyperparathyroidism, unspecified: Secondary | ICD-10-CM | POA: Diagnosis not present

## 2022-11-06 DIAGNOSIS — F3340 Major depressive disorder, recurrent, in remission, unspecified: Secondary | ICD-10-CM

## 2022-11-06 DIAGNOSIS — Z0000 Encounter for general adult medical examination without abnormal findings: Secondary | ICD-10-CM | POA: Diagnosis not present

## 2022-11-06 DIAGNOSIS — I5032 Chronic diastolic (congestive) heart failure: Secondary | ICD-10-CM | POA: Diagnosis not present

## 2022-11-06 DIAGNOSIS — R911 Solitary pulmonary nodule: Secondary | ICD-10-CM

## 2022-11-06 MED ORDER — VALSARTAN 160 MG PO TABS: 160.0000 mg | ORAL_TABLET | Freq: Every day | ORAL | 3 refills | Status: DC

## 2022-11-06 NOTE — Assessment & Plan Note (Signed)
BP Readings from Last 3 Encounters:  11/06/22 138/82  10/15/22 138/88  07/10/22 132/88   Still elevated at home Will increase valsartan to 160mg  daily Check labs

## 2022-11-06 NOTE — Assessment & Plan Note (Signed)
Prefers no surgery but will check DEXA to be sure not markedly worse

## 2022-11-06 NOTE — Assessment & Plan Note (Signed)
No fluid overload now On the valsartan only

## 2022-11-06 NOTE — Progress Notes (Signed)
Subjective:    Patient ID: Wendy Murray, female    DOB: [birth year], 87 y.o.   MRN: [id number]  HPI Here for Medicare wellness visit and follow up of chronic health conditions Reviewed advanced directives Reviewed other doctors---Dr Poggi--ortho, Dr DeFoor--rheumatology, Dr Josie Saunders,  Dr Ledon Snare No surgery or hospitalizations in the past year Vision is okay--exam coming up Hearing aides help No alcohol or tobacco Walks the dog daily--discussed adding resistance work No falls Independent with instrumental ADLs No sig memory issues  No side effects on the higher dose of valsartan Home BP is not any lower---142-159 systolic/80-90's No chest pain or SOB Chronic headaches--no change No palpitations Some foot/leg edema---can be anytime Stays away from salt  Known primary hyperparathyroidism  No fatigue Some aches and pains--using tizanidine for her neck mostly  Sleeps okay with mirtazapine/clonazepam Mood has been okay Continues on the venlafaxine as well  Takes omeprazole daily Mostly controls the heartburn No dysphagia  Current Outpatient Medications on File Prior to Visit  Medication Sig Dispense Refill   albuterol (VENTOLIN HFA) 108 (90 Base) MCG/ACT inhaler Inhale 2 puffs into the lungs every 6 (six) hours as needed for wheezing or shortness of breath. 8 g 2   amoxicillin (AMOXIL) 500 MG tablet Take 4 tablets (2,000 mg total) by mouth daily. 1 hour before dental appointment 20 tablet 0   aspirin 81 MG EC tablet Take 1 tablet (81 mg total) by mouth daily. Swallow whole. 90 tablet 3   atorvastatin (LIPITOR) 40 MG tablet TAKE 1 TABLET BY MOUTH EVERY EVENING 270 tablet 3   clonazePAM (KLONOPIN) 1 MG tablet Take 0.5-1 tablets (0.5-1 mg total) by mouth at bedtime as needed. 30 tablet 0   levothyroxine (SYNTHROID) 75 MCG tablet TAKE 1 TABLET BY MOUTH EVERY DAY 270 tablet 3   mirtazapine (REMERON) 15 MG tablet TAKE 1 TABLET BY MOUTH EVERY DAY AT BEDTIME  270 tablet 3   omeprazole (PRILOSEC) 20 MG capsule Take 1 capsule (20 mg total) by mouth every evening. 90 capsule 3   Probiotic Product (ALIGN) 4 MG CAPS Take 1 capsule by mouth at bedtime.     tiZANidine (ZANAFLEX) 4 MG tablet Take 1 tablet (4 mg total) by mouth every 8 (eight) hours as needed for muscle spasms. 60 tablet 1   valsartan (DIOVAN) 80 MG tablet Take 1.5 tablets (120 mg total) by mouth daily. 135 tablet 3   venlafaxine XR (EFFEXOR-XR) 150 MG 24 hr capsule TAKE 1 CAPSULE BY MOUTH EVERY DAY WITH 75 MG CAPSULE 270 capsule 3   venlafaxine XR (EFFEXOR-XR) 75 MG 24 hr capsule TAKE 1 CAPSULE BY MOUTH EVERY DAY ALONG WITH 150 MG CAPSULE 270 capsule 3   No current facility-administered medications on file prior to visit.    No Known Allergies  Past Medical History:  Diagnosis Date   CHF (congestive heart failure) (HCC) 2022   diastolic   COVID-19 05/30/2021   Diverticulosis    DVT (deep venous thrombosis) (HCC) 06/16/2010   after TKR   Hyperlipidemia    Hyperparathyroidism (HCC)    Hypothyroidism    IBS (irritable bowel syndrome)    Inflammatory arthritis    Osteoarthritis, knee    Recurrent major depression (HCC)    TIA (transient ischemic attack) 01/2021    Past Surgical History:  Procedure Laterality Date   ABDOMINAL HYSTERECTOMY  06/16/1989   CATARACT EXTRACTION W/ INTRAOCULAR LENS  IMPLANT, BILATERAL  06/16/2012   CHOLECYSTECTOMY  06/16/1996   COLONOSCOPY  DEXA  08/15/2007   Normal   HAMMER TOE SURGERY Left 10/15/2014   Dr Elvin So   LUMBAR FUSION  06/17/1991   MENISCUS REPAIR Left 04/16/2010   TOTAL KNEE ARTHROPLASTY Left 06/16/2010   TOTAL KNEE ARTHROPLASTY Right 07/09/2021   Procedure: TOTAL KNEE ARTHROPLASTY;  Surgeon: Christena Flake, MD;  Location: [facility] OR[facility];  Service: Orthopedics;  Laterality: Right;    Family History  Problem Relation Age of Onset   Cancer Mother        colon   Depression Mother    Cancer Father    Depression Sister     Hypertension Neg Hx    Heart disease Neg Hx    Diabetes Neg Hx     Social History   Socioeconomic History   Marital status: Widowed    Spouse name: Not on file   Number of children: 3   Years of education: Not on file   Highest education level: Not on file  Occupational History   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    Comment: Retired  Tobacco Use   Smoking status: Former    Types: Cigarettes    Quit date: 06/16/1981    Years since quitting: 41.4    Passive exposure: Past (as a child)   Smokeless tobacco: Never  Vaping Use   Vaping Use: Never used  Substance and Sexual Activity   Alcohol use: Yes    Alcohol/week: 0.0 standard drinks of alcohol    Comment: rarely   Drug use: No   Sexual activity: Not Currently  Other Topics Concern   Not on file  Social History Narrative   Children in [place], [place] and [place]   Widowed 1/23      Has living will    Daughter Wendy Murray, have health care POA   Has DNR ---order rewritten   No feeding tube if cognitively unaware   Will be donating body to [employer]   Social Determinants of Health   Financial Resource Strain: Not on file  Food Insecurity: Not on file  Transportation Needs: Not on file  Physical Activity: Not on file  Stress: Not on file  Social Connections: Not on file  Intimate Partner Violence: Not on file   Review of Systems Still only eats at night--that helps her avoid the intestinal problems Weight is down 10# since not eating breakfast and lunch Bowels move fine--no blood Wears seat belt Teeth okay--keeps up with dentist No other sig back or joint pains. Operative knee occasionally painful on medial side No suspicious skin lesions    Objective:   Physical Exam Constitutional:      Appearance: Normal appearance.  HENT:     Mouth/Throat:     Pharynx: No oropharyngeal exudate or posterior oropharyngeal erythema.  Eyes:     Conjunctiva/sclera: Conjunctivae normal.     Pupils:  Pupils are equal, round, and reactive to light.  Cardiovascular:     Rate and Rhythm: Normal rate and regular rhythm.     Pulses: Normal pulses.     Heart sounds: No murmur heard.    No gallop.  Pulmonary:     Effort: Pulmonary effort is normal.     Breath sounds: Normal breath sounds. No wheezing or rales.  Abdominal:     Palpations: Abdomen is soft.     Tenderness: There is no abdominal tenderness.  Musculoskeletal:     Cervical back: Neck supple.     Right lower leg: No edema.     Left  lower leg: No edema.  Lymphadenopathy:     Cervical: No cervical adenopathy.  Skin:    Findings: No rash.  Neurological:     General: No focal deficit present.     Mental Status: She is alert and oriented to person, place, and time.     Comments: Word naming 151 minute Recall 3/3  Psychiatric:        Mood and Affect: Mood normal.        Behavior: Behavior normal.            Assessment & Plan:

## 2022-11-06 NOTE — Assessment & Plan Note (Signed)
Seems to be euthyroid on levothyroxine 75 ?

## 2022-11-06 NOTE — Assessment & Plan Note (Signed)
Doing better on the venalfaxine 225mg  and mirtazapine 15

## 2022-11-06 NOTE — Assessment & Plan Note (Signed)
I have personally reviewed the Medicare Annual Wellness questionnaire and have noted 1. [name] medical and social history 2. Their use of alcohol, tobacco or illicit drugs 3. Their current medications and supplements 4. [name] functional ability including ADL's, fall risks, home safety risks and hearing or visual             impairment. 5. Diet and physical activities 6. Evidence for depression or mood disorders  The patients weight, height, BMI and visual acuity have been recorded in the chart I have made referrals, counseling and provided education to the patient based review of the above and I have provided the pt with a written personalized care plan for preventive services.  I have provided you with a copy of your personalized plan for preventive services. Please take the time to review along with your updated medication list.  No cancer screening due to age Discussed adding resistance exercise Had updated COVID vaccine Flu vaccine in the fall Had RSV Td at the pharmacy

## 2022-11-06 NOTE — Assessment & Plan Note (Signed)
Will check CT scan

## 2022-11-07 ENCOUNTER — Encounter: Payer: Self-pay | Admitting: *Deleted

## 2022-11-07 LAB — HEPATIC FUNCTION PANEL
ALT: 21 U/L (ref 0–35)
AST: 19 U/L (ref 0–37)
Albumin: 4.2 g/dL (ref 3.5–5.2)
Alkaline Phosphatase: 132 U/L — ABNORMAL HIGH (ref 39–117)
Bilirubin, Direct: 0.1 mg/dL (ref 0.0–0.3)
Total Bilirubin: 0.5 mg/dL (ref 0.2–1.2)
Total Protein: 6.8 g/dL (ref 6.0–8.3)

## 2022-11-07 LAB — RENAL FUNCTION PANEL
Albumin: 4.2 g/dL (ref 3.5–5.2)
BUN: 18 mg/dL (ref 6–23)
CO2: 29 mEq/L (ref 19–32)
Calcium: 10.7 mg/dL — ABNORMAL HIGH (ref 8.4–10.5)
Chloride: 103 mEq/L (ref 96–112)
Creatinine, Ser: 0.9 mg/dL (ref 0.40–1.20)
GFR: 58.37 mL/min — ABNORMAL LOW (ref >60.00)
Glucose, Bld: 94 mg/dL (ref 70–99)
Phosphorus: 3.3 mg/dL (ref 2.3–4.6)
Potassium: 5 mEq/L (ref 3.5–5.1)
Sodium: 140 mEq/L (ref 135–145)

## 2022-11-07 LAB — LIPID PANEL
Cholesterol: 148 mg/dL (ref 0–200)
HDL: 36.9 mg/dL — ABNORMAL LOW (ref >39.00)
LDL Cholesterol: 71 mg/dL (ref 0–99)
NonHDL: 110.73
Total CHOL/HDL Ratio: 4
Triglycerides: 197 mg/dL — ABNORMAL HIGH (ref 0.0–149.0)
VLDL: 39.4 mg/dL (ref 0.0–40.0)

## 2022-11-07 LAB — CBC
HCT: 40.7 % (ref 36.0–46.0)
Hemoglobin: 13.3 g/dL (ref 12.0–15.0)
MCHC: 32.5 g/dL (ref 30.0–36.0)
MCV: 84.6 fl (ref 78.0–100.0)
Platelets: 454 10*3/uL — ABNORMAL HIGH (ref 150.0–400.0)
RBC: 4.82 Mil/uL (ref 3.87–5.11)
RDW: 14.9 % (ref 11.5–15.5)
WBC: 7.6 10*3/uL (ref 4.0–10.5)

## 2022-11-07 LAB — VITAMIN D 25 HYDROXY (VIT D DEFICIENCY, FRACTURES): VITD: 46.29 ng/mL (ref 30.00–100.00)

## 2022-11-07 LAB — TSH: TSH: 3.46 u[IU]/mL (ref 0.35–5.50)

## 2022-11-07 LAB — T4, FREE: Free T4: 0.89 ng/dL (ref 0.60–1.60)

## 2022-11-07 LAB — PARATHYROID HORMONE, INTACT (NO CA): PTH: 167 pg/mL — ABNORMAL HIGH (ref 16–77)

## 2022-11-10 ENCOUNTER — Encounter: Payer: Self-pay | Admitting: Internal Medicine

## 2022-11-11 ENCOUNTER — Encounter: Payer: Self-pay | Admitting: Internal Medicine

## 2022-11-11 ENCOUNTER — Encounter: Payer: Self-pay | Admitting: *Deleted

## 2022-11-11 ENCOUNTER — Ambulatory Visit
Admission: RE | Admit: 2022-11-11 | Discharge: 2022-11-11 | Disposition: A | Discharge status: Home / Self Care | Payer: Medicare Other | Source: Ambulatory Visit | Attending: Internal Medicine | Admitting: Internal Medicine

## 2022-11-11 DIAGNOSIS — E213 Hyperparathyroidism, unspecified: Secondary | ICD-10-CM | POA: Insufficient documentation

## 2022-11-11 DIAGNOSIS — Z780 Asymptomatic menopausal state: Secondary | ICD-10-CM | POA: Diagnosis not present

## 2022-11-11 DIAGNOSIS — M8589 Other specified disorders of bone density and structure, multiple sites: Secondary | ICD-10-CM | POA: Diagnosis not present

## 2022-11-11 NOTE — Addendum Note (Signed)
Addended by: Tillman Abide I on: 11/11/2022 10:59 AM   Modules accepted: Orders

## 2022-11-12 NOTE — Telephone Encounter (Signed)
I will take a look at it.  Thanks!

## 2022-11-12 NOTE — Telephone Encounter (Signed)
The there is criteria that has be met and processes followed in order to be accepted by the L[facility] Program or have the Imaging done in that category and insurance cover it.  See below...  For patients that need Lung nodule workup:  When placing the referral to the Lung Cancer Screening Program please make sure to place it as [id number] - AMBULATORY REFERRAL FOR LUNG CANCER SCREENING. You may also add in Kandice Robinsons, NP under the Provider if you would like, though not necessary.   Make sure to not place the referral to Pulmonary (JYN82). When placing the referral as REF94 - AMB REFERRAL TO PULMONOLOGY it ends up going to the main Pulmonary Referral que which delays the processing of the referral to the appropriate person/scheduler.  Also, please do not place orders for Low Dose CT Scan as I cannot precert these and cannot schedule these. If a patient is needing a follow-up LDCT or a new LDCT they will have to go through the LCS Program to have this done IF they qualify.              Qualifications:            -Age 89-77 (Medicare)            -Age 67-80 (Commercial)            -20 pack year smoking history+            -Current or former smoker within last 15 years.      If not wanting to place the referral to the LCS Program you can also place a regular CT Chest in place of the Low Dose CT scan.  The correct order for anyone that does not qualify for LCS Imaging is a CT Chest WO

## 2022-12-05 DIAGNOSIS — D3132 Benign neoplasm of left choroid: Secondary | ICD-10-CM | POA: Diagnosis not present

## 2022-12-05 DIAGNOSIS — H532 Diplopia: Secondary | ICD-10-CM | POA: Diagnosis not present

## 2022-12-05 DIAGNOSIS — H43813 Vitreous degeneration, bilateral: Secondary | ICD-10-CM | POA: Diagnosis not present

## 2022-12-05 DIAGNOSIS — Z961 Presence of intraocular lens: Secondary | ICD-10-CM | POA: Diagnosis not present

## 2022-12-08 ENCOUNTER — Ambulatory Visit
Admission: RE | Admit: 2022-12-08 | Discharge: 2022-12-08 | Disposition: A | Discharge status: Home / Self Care | Payer: Medicare Other | Source: Ambulatory Visit | Attending: Internal Medicine | Admitting: Internal Medicine

## 2022-12-08 DIAGNOSIS — R911 Solitary pulmonary nodule: Secondary | ICD-10-CM | POA: Diagnosis not present

## 2022-12-08 DIAGNOSIS — R918 Other nonspecific abnormal finding of lung field: Secondary | ICD-10-CM | POA: Diagnosis not present

## 2022-12-08 DIAGNOSIS — J90 Pleural effusion, not elsewhere classified: Secondary | ICD-10-CM | POA: Diagnosis not present

## 2022-12-17 ENCOUNTER — Other Ambulatory Visit: Payer: Self-pay | Admitting: Internal Medicine

## 2022-12-17 MED ORDER — CLONAZEPAM 1 MG PO TABS: 0.5000 mg | ORAL_TABLET | Freq: Every evening | ORAL | 0 refills | Status: DC | PRN

## 2022-12-17 NOTE — Telephone Encounter (Signed)
Last filled 10-30-22 #30 Last OV 11-06-22 No Future OV [facility] [address]

## 2023-02-09 DIAGNOSIS — Z8739 Personal history of other diseases of the musculoskeletal system and connective tissue: Secondary | ICD-10-CM | POA: Diagnosis not present

## 2023-02-09 DIAGNOSIS — G8929 Other chronic pain: Secondary | ICD-10-CM | POA: Diagnosis not present

## 2023-02-09 DIAGNOSIS — M25512 Pain in left shoulder: Secondary | ICD-10-CM | POA: Diagnosis not present

## 2023-02-09 DIAGNOSIS — M184 Other bilateral secondary osteoarthritis of first carpometacarpal joints: Secondary | ICD-10-CM | POA: Diagnosis not present

## 2023-03-02 ENCOUNTER — Other Ambulatory Visit: Payer: Self-pay

## 2023-03-02 NOTE — Telephone Encounter (Signed)
Unable to reach pt by phone; Mailbox not accept messages due to v/m not set up. Sending note to [place] [facility].

## 2023-03-03 NOTE — Telephone Encounter (Signed)
Spoke to pt. She said she needs a refill of clonazepam sent to [facility] [address]. Last OV/[facility] 11-06-22 No Future OV

## 2023-03-04 MED ORDER — CLONAZEPAM 1 MG PO TABS: 0.5000 mg | ORAL_TABLET | Freq: Every evening | ORAL | 0 refills | Status: DC | PRN

## 2023-03-10 ENCOUNTER — Ambulatory Visit (INDEPENDENT_AMBULATORY_CARE_PROVIDER_SITE_OTHER): Payer: Medicare Other | Admitting: Internal Medicine

## 2023-03-10 ENCOUNTER — Encounter: Payer: Self-pay | Admitting: Internal Medicine

## 2023-03-10 VITALS — BP 130/76 | HR 92 | Temp 97.5°F | Ht 65.0 in | Wt 165.0 lb

## 2023-03-10 DIAGNOSIS — E213 Hyperparathyroidism, unspecified: Secondary | ICD-10-CM | POA: Diagnosis not present

## 2023-03-10 DIAGNOSIS — Z23 Encounter for immunization: Secondary | ICD-10-CM | POA: Diagnosis not present

## 2023-03-10 DIAGNOSIS — R61 Generalized hyperhidrosis: Secondary | ICD-10-CM | POA: Insufficient documentation

## 2023-03-10 LAB — T4, FREE: Free T4: 0.83 ng/dL (ref 0.60–1.60)

## 2023-03-10 LAB — RENAL FUNCTION PANEL
Albumin: 4.1 g/dL (ref 3.5–5.2)
BUN: 18 mg/dL (ref 6–23)
CO2: 29 mEq/L (ref 19–32)
Calcium: 10.7 mg/dL — ABNORMAL HIGH (ref 8.4–10.5)
Chloride: 104 mEq/L (ref 96–112)
Creatinine, Ser: 0.91 mg/dL (ref 0.40–1.20)
GFR: 57.47 mL/min — ABNORMAL LOW (ref >60.00)
Glucose, Bld: 110 mg/dL — ABNORMAL HIGH (ref 70–99)
Phosphorus: 3.4 mg/dL (ref 2.3–4.6)
Potassium: 4.7 mEq/L (ref 3.5–5.1)
Sodium: 141 mEq/L (ref 135–145)

## 2023-03-10 LAB — TSH: TSH: 2.13 u[IU]/mL (ref 0.35–5.50)

## 2023-03-10 NOTE — Addendum Note (Signed)
Addended by: Eual Fines on: 03/10/2023 01:11 PM   Modules accepted: Orders

## 2023-03-10 NOTE — Assessment & Plan Note (Signed)
Will recheck labs

## 2023-03-10 NOTE — Progress Notes (Signed)
Subjective:    Patient ID: Wendy Murray, female    DOB: Feb 11, 1936, 87 y.o.   MRN: [id number]  HPI Here due to excessive sweating  Feels hot all the time--even in air conditioning All summer but seems to be worse Wondered about her thyroid  Has lost weight--not trying Appetite seems the same  Mood still okay on her current regimen But not getting out as much--but plans to restart bible study Not really exercising  Current Outpatient Medications on File Prior to Visit  Medication Sig Dispense Refill   albuterol (VENTOLIN HFA) 108 (90 Base) MCG/ACT inhaler Inhale 2 puffs into the lungs every 6 (six) hours as needed for wheezing or shortness of breath. 8 g 2   amoxicillin (AMOXIL) 500 MG tablet Take 4 tablets (2,000 mg total) by mouth daily. 1 hour before dental appointment 20 tablet 0   aspirin 81 MG EC tablet Take 1 tablet (81 mg total) by mouth daily. Swallow whole. 90 tablet 3   atorvastatin (LIPITOR) 40 MG tablet TAKE 1 TABLET BY MOUTH EVERY EVENING 270 tablet 3   clonazePAM (KLONOPIN) 1 MG tablet Take 0.5-1 tablets (0.5-1 mg total) by mouth at bedtime as needed. 30 tablet 0   hydroxychloroquine (PLAQUENIL) 200 MG tablet Take 200 mg by mouth 2 (two) times daily.     levothyroxine (SYNTHROID) 75 MCG tablet TAKE 1 TABLET BY MOUTH EVERY DAY 270 tablet 3   mirtazapine (REMERON) 15 MG tablet TAKE 1 TABLET BY MOUTH EVERY DAY AT BEDTIME 270 tablet 3   omeprazole (PRILOSEC) 20 MG capsule Take 1 capsule (20 mg total) by mouth every evening. 90 capsule 3   Probiotic Product (ALIGN) 4 MG CAPS Take 1 capsule by mouth at bedtime.     tiZANidine (ZANAFLEX) 4 MG tablet Take 1 tablet (4 mg total) by mouth every 8 (eight) hours as needed for muscle spasms. 60 tablet 1   valsartan (DIOVAN) 160 MG tablet Take 1 tablet (160 mg total) by mouth daily. 90 tablet 3   venlafaxine XR (EFFEXOR-XR) 150 MG 24 hr capsule TAKE 1 CAPSULE BY MOUTH EVERY DAY WITH 75 MG CAPSULE 270 capsule 3   venlafaxine  XR (EFFEXOR-XR) 75 MG 24 hr capsule TAKE 1 CAPSULE BY MOUTH EVERY DAY ALONG WITH 150 MG CAPSULE 270 capsule 3   No current facility-administered medications on file prior to visit.    No Known Allergies  Past Medical History:  Diagnosis Date   CHF (congestive heart failure) (HCC) 2022   diastolic   COVID-19 05/30/2021   Diverticulosis    DVT (deep venous thrombosis) (HCC) 06/16/2010   after TKR   Hyperlipidemia    Hyperparathyroidism (HCC)    Hypothyroidism    IBS (irritable bowel syndrome)    Inflammatory arthritis    Osteoarthritis, knee    Recurrent major depression (HCC)    TIA (transient ischemic attack) 01/2021    Past Surgical History:  Procedure Laterality Date   ABDOMINAL HYSTERECTOMY  06/16/1989   CATARACT EXTRACTION W/ INTRAOCULAR LENS  IMPLANT, BILATERAL  06/16/2012   CHOLECYSTECTOMY  06/16/1996   COLONOSCOPY     DEXA  08/15/2007   Normal   HAMMER TOE SURGERY Left 10/15/2014   Dr Elvin So   LUMBAR FUSION  06/17/1991   MENISCUS REPAIR Left 04/16/2010   TOTAL KNEE ARTHROPLASTY Left 06/16/2010   TOTAL KNEE ARTHROPLASTY Right 07/09/2021   Procedure: TOTAL KNEE ARTHROPLASTY;  Surgeon: Christena Flake, MD;  Location: [facility] ORS;  Service: Orthopedics;  Laterality: Right;  Family History  Problem Relation Age of Onset   Cancer Mother        colon   Depression Mother    Cancer Father    Depression Sister    Hypertension Neg Hx    Heart disease Neg Hx    Diabetes Neg Hx     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    Comment: Retired  Tobacco Use   Smoking status: Former    Current packs/day: 0.00    Types: Cigarettes    Quit date: 06/16/1981    Years since quitting: 41.7    Passive exposure: Past (as a  child)   Smokeless tobacco: Never  Vaping Use   Vaping status: Never Used  Substance and Sexual Activity   Alcohol use: Yes    Alcohol/week: 0.0 standard drinks of alcohol    Comment: rarely   Drug use: No   Sexual activity: Not Currently  Other Topics Concern   Not on file  Social History Narrative   Children in [place], [place] and [place]   Widowed 1/23      Has living will    Daughter [name], have health care POA   Has DNR ---order rewritten   No feeding tube if cognitively unaware   Will be donating body to [facility]   Social Determinants of Health   Financial Resource Strain: Low Risk  (03/08/2023)   Overall Financial Resource Strain (CARDIA)    Difficulty of Paying Living Expenses: Not very hard  Food Insecurity: No Food Insecurity (03/08/2023)   Hunger Vital Sign    Worried About Running Out of Food in the Last Year: Never true    Ran Out of Food in the Last Year: Never true  Transportation Needs: Not on file  Physical Activity: Unknown (03/08/2023)   Exercise Vital Sign    Days of Exercise per Week: 0 days    Minutes of Exercise per Session: Not on file  Stress: Stress Concern Present (03/08/2023)   [employer] of Occupational Health - Occupational Stress Questionnaire    Feeling of Stress : To some extent  Social Connections: Moderately Integrated (03/08/2023)   Social Connection and Isolation Panel [NHANES]    Frequency of Communication with Friends and Family: Twice a week    Frequency of Social Gatherings with Friends and Family: Twice a week    Attends Religious Services: More than 4 times per year    Active Member of [employer] or Organizations: Yes    Attends Banker Meetings: More than 4 times per year    Marital Status: Widowed  Intimate Partner Violence: Not on file   Review of Systems Has lost 10# Sleeps okay     Objective:   Physical Exam         Assessment & Plan:

## 2023-03-10 NOTE — Assessment & Plan Note (Signed)
Not usually from hyperparathyroidism Will recheck thyroid If no other findings---would refer to reconsider parathyroidectomy--but here in [employer] with local group

## 2023-03-11 ENCOUNTER — Other Ambulatory Visit: Payer: Self-pay | Admitting: Internal Medicine

## 2023-03-11 DIAGNOSIS — M79675 Pain in left toe(s): Secondary | ICD-10-CM | POA: Diagnosis not present

## 2023-03-11 DIAGNOSIS — E210 Primary hyperparathyroidism: Secondary | ICD-10-CM

## 2023-03-11 DIAGNOSIS — M79674 Pain in right toe(s): Secondary | ICD-10-CM | POA: Diagnosis not present

## 2023-03-11 DIAGNOSIS — B351 Tinea unguium: Secondary | ICD-10-CM | POA: Diagnosis not present

## 2023-03-11 DIAGNOSIS — L600 Ingrowing nail: Secondary | ICD-10-CM | POA: Diagnosis not present

## 2023-03-11 LAB — PARATHYROID HORMONE, INTACT (NO CA): PTH: 176 pg/mL — ABNORMAL HIGH (ref 16–77)

## 2023-03-12 ENCOUNTER — Encounter: Payer: Self-pay | Admitting: *Deleted

## 2023-03-12 ENCOUNTER — Encounter: Payer: Self-pay | Admitting: Internal Medicine

## 2023-03-12 NOTE — Addendum Note (Signed)
Addended by: Tillman Abide I on: 03/12/2023 12:57 PM   Modules accepted: Orders

## 2023-03-13 DIAGNOSIS — Z23 Encounter for immunization: Secondary | ICD-10-CM | POA: Diagnosis not present

## 2023-03-16 ENCOUNTER — Encounter: Payer: Self-pay | Admitting: Internal Medicine

## 2023-03-26 ENCOUNTER — Encounter: Payer: Self-pay | Admitting: Internal Medicine

## 2023-04-28 ENCOUNTER — Encounter: Payer: Self-pay | Admitting: Nurse Practitioner

## 2023-04-28 ENCOUNTER — Ambulatory Visit: Payer: Self-pay | Admitting: Surgery

## 2023-04-28 ENCOUNTER — Ambulatory Visit: Payer: Medicare Other | Admitting: Nurse Practitioner

## 2023-04-28 VITALS — BP 136/82 | HR 86 | Temp 97.3°F | Ht 65.0 in | Wt 162.0 lb

## 2023-04-28 DIAGNOSIS — E210 Primary hyperparathyroidism: Secondary | ICD-10-CM | POA: Diagnosis not present

## 2023-04-28 DIAGNOSIS — K580 Irritable bowel syndrome with diarrhea: Secondary | ICD-10-CM

## 2023-04-28 MED ORDER — DICYCLOMINE HCL 10 MG PO CAPS
10.0000 mg | ORAL_CAPSULE | Freq: Three times a day (TID) | ORAL | 0 refills | Status: DC
Start: 2023-04-28 — End: 2023-06-04

## 2023-04-28 NOTE — Patient Instructions (Addendum)
Increase probiotics to twice daily  To eat several small meals a day Bland diet- advance as tolerates Banana, rice, applesauce, toast Plain grilled chicken  Low fat diet.   No big meals- small meals throughout the day.

## 2023-04-28 NOTE — Progress Notes (Unsigned)
Careteam: Patient Care Team: Wendy Schwalbe, MD as PCP - General (Internal Medicine)  Advanced Directive information Does Patient Have a Medical Advance Directive?: Yes, Type of Advance Directive: Healthcare Power of [place];Out of facility DNR (pink MOST or yellow form);Living will, Does patient want to make changes to medical advance directive?: No - Patient declined  No Known Allergies  Chief Complaint  Patient presents with   Acute Visit    Diarrhea for 2 weeks,bloating and cramps after eating.      HPI: Patient is a 87 y.o. female seen in today at the [facility] for diarrhea.  She has had IBS since her 4s.  Over the last 2 weeks she has had bloating and pain/cramps and then diarrhea.  Seems like constipation but will have diarrhea. She does not eat breakfast or lunch so she does not have issues with the bowel Once she eats will have several BMs  Went out to eat twice and had to leave due to the diarrhea.  No fever or chills.  She is taking probiotic daily  No recent antibiotic use.  Does not have a local GI doctor.   Review of Systems:  Review of Systems  Constitutional:  Negative for chills, fever and weight loss.  HENT:  Negative for tinnitus.   Respiratory:  Negative for cough, sputum production and shortness of breath.   Cardiovascular:  Negative for chest pain, palpitations and leg swelling.  Gastrointestinal:  Positive for abdominal pain and diarrhea. Negative for constipation, heartburn, nausea and vomiting.       Will have rare vomiting.  Genitourinary:  Negative for dysuria, frequency and urgency.  Musculoskeletal:  Negative for back pain, falls, joint pain and myalgias.  Skin: Negative.   Neurological:  Negative for dizziness and headaches.  Psychiatric/Behavioral:  Negative for depression and memory loss. The patient does not have insomnia.     Past Medical History:  Diagnosis Date   CHF (congestive heart failure) (HCC) 2022   diastolic    COVID-19 05/30/2021   Diverticulosis    DVT (deep venous thrombosis) (HCC) 06/16/2010   after TKR   Hyperlipidemia    Hyperparathyroidism (HCC)    Hypothyroidism    IBS (irritable bowel syndrome)    Inflammatory arthritis    Osteoarthritis, knee    Recurrent major depression (HCC)    TIA (transient ischemic attack) 01/2021   Past Surgical History:  Procedure Laterality Date   ABDOMINAL HYSTERECTOMY  06/16/1989   CATARACT EXTRACTION W/ INTRAOCULAR LENS  IMPLANT, BILATERAL  06/16/2012   CHOLECYSTECTOMY  06/16/1996   COLONOSCOPY     DEXA  08/15/2007   Normal   HAMMER TOE SURGERY Left 10/15/2014   Dr Elvin So   LUMBAR FUSION  06/17/1991   MENISCUS REPAIR Left 04/16/2010   TOTAL KNEE ARTHROPLASTY Left 06/16/2010   TOTAL KNEE ARTHROPLASTY Right 07/09/2021   Procedure: TOTAL KNEE ARTHROPLASTY;  Surgeon: Christena Flake, MD;  Location: [facility] ORS;  Service: Orthopedics;  Laterality: Right;   Social History:   reports that she quit smoking about 41 years ago. Her smoking use included cigarettes. She has been exposed to tobacco smoke. She has never used smokeless tobacco. She reports current alcohol use. She reports that she does not use drugs.  Family History  Problem Relation Age of Onset   Cancer Mother        colon   Depression Mother    Cancer Father    Depression Sister    Hypertension Neg Hx  Heart disease Neg Hx    Diabetes Neg Hx     Medications: Patient's Medications  New Prescriptions   No medications on file  Previous Medications   ALBUTEROL (VENTOLIN HFA) 108 (90 BASE) MCG/ACT INHALER    Inhale 2 puffs into the lungs every 6 (six) hours as needed for wheezing or shortness of breath.   AMOXICILLIN (AMOXIL) 500 MG TABLET    Take 4 tablets (2,000 mg total) by mouth daily. 1 hour before dental appointment   ASPIRIN 81 MG EC TABLET    Take 1 tablet (81 mg total) by mouth daily. Swallow whole.   ATORVASTATIN (LIPITOR) 40 MG TABLET    TAKE 1 TABLET BY MOUTH EVERY EVENING    CLONAZEPAM (KLONOPIN) 1 MG TABLET    Take 0.5-1 tablets (0.5-1 mg total) by mouth at bedtime as needed.   HYDROXYCHLOROQUINE (PLAQUENIL) 200 MG TABLET    Take 200 mg by mouth 2 (two) times daily.   LEVOTHYROXINE (SYNTHROID) 75 MCG TABLET    TAKE 1 TABLET BY MOUTH EVERY DAY   MIRTAZAPINE (REMERON) 15 MG TABLET    TAKE 1 TABLET BY MOUTH EVERY DAY AT BEDTIME   OMEPRAZOLE (PRILOSEC) 20 MG CAPSULE    Take 1 capsule (20 mg total) by mouth every evening.   PROBIOTIC PRODUCT (ALIGN) 4 MG CAPS    Take 1 capsule by mouth at bedtime.   TIZANIDINE (ZANAFLEX) 4 MG TABLET    Take 1 tablet (4 mg total) by mouth every 8 (eight) hours as needed for muscle spasms.   VALSARTAN (DIOVAN) 160 MG TABLET    Take 1 tablet (160 mg total) by mouth daily.   VENLAFAXINE XR (EFFEXOR-XR) 150 MG 24 HR CAPSULE    TAKE 1 CAPSULE BY MOUTH EVERY DAY WITH 75 MG CAPSULE   VENLAFAXINE XR (EFFEXOR-XR) 75 MG 24 HR CAPSULE    TAKE 1 CAPSULE BY MOUTH EVERY DAY ALONG WITH 150 MG CAPSULE  Modified Medications   No medications on file  Discontinued Medications   No medications on file    Physical Exam:  Vitals:   04/28/23 0944  BP: 136/82  Pulse: 86  Temp: (!) 97.3 F (36.3 C)  SpO2: 97%  Weight: 162 lb (73.5 kg)  Height: 5\' 5"  (1.651 m)   Body mass index is 26.96 kg/m. Wt Readings from Last 3 Encounters:  04/28/23 162 lb (73.5 kg)  03/10/23 165 lb (74.8 kg)  11/06/22 175 lb (79.4 kg)    Physical Exam Constitutional:      General: She is not in acute distress.    Appearance: She is well-developed. She is not diaphoretic.  HENT:     Head: Normocephalic and atraumatic.     Mouth/Throat:     Pharynx: No oropharyngeal exudate.  Eyes:     Conjunctiva/sclera: Conjunctivae normal.     Pupils: Pupils are equal, round, and reactive to light.  Cardiovascular:     Rate and Rhythm: Normal rate and regular rhythm.     Heart sounds: Normal heart sounds.  Pulmonary:     Effort: Pulmonary effort is normal.     Breath  sounds: Normal breath sounds.  Abdominal:     General: Bowel sounds are normal.     Palpations: Abdomen is soft.  Musculoskeletal:     Cervical back: Normal range of motion and neck supple.     Right lower leg: No edema.     Left lower leg: No edema.  Skin:    General: Skin is  warm and dry.  Neurological:     Mental Status: She is alert.  Psychiatric:        Mood and Affect: Mood normal.   ***  Labs reviewed: Basic Metabolic Panel: Recent Labs    07/10/22 1533 11/06/22 1549 03/10/23 1318  NA 137 140 141  K 4.2 5.0 4.7  CL 102 103 104  CO2 26 29 29   GLUCOSE 103* 94 110*  BUN 13 18 18   CREATININE 0.87 0.90 0.91  CALCIUM 10.7* 10.7* 10.7*  PHOS 2.9 3.3 3.4  TSH  --  3.46 2.13   Liver Function Tests: Recent Labs    07/10/22 1533 11/06/22 1549 03/10/23 1318  AST  --  19  --   ALT  --  21  --   ALKPHOS  --  132*  --   BILITOT  --  0.5  --   PROT  --  6.8  --   ALBUMIN 4.4 4.2  4.2 4.1   No results for input(s): "LIPASE", "AMYLASE" in the last 8760 hours. No results for input(s): "AMMONIA" in the last 8760 hours. CBC: Recent Labs    05/20/22 1729 11/06/22 1549  WBC 10.9* 7.6  NEUTROABS 7.5  --   HGB 13.8 13.3  HCT 45.1 40.7  MCV 87.6 84.6  PLT 433* 454.0*   Lipid Panel: Recent Labs    11/06/22 1549  CHOL 148  HDL 36.90*  LDLCALC 71  TRIG 197.0*  CHOLHDL 4   TSH: Recent Labs    11/06/22 1549 03/10/23 1318  TSH 3.46 2.13   A1C: Lab Results  Component Value Date   HGBA1C 6.1 (H) 01/23/2021     Assessment/Plan There are no diagnoses linked to this encounter.  Next appt: *** Tamani Durney K. Biagio Borg  [facility] [phone]

## 2023-05-05 ENCOUNTER — Ambulatory Visit (INDEPENDENT_AMBULATORY_CARE_PROVIDER_SITE_OTHER): Payer: Medicare Other | Admitting: Internal Medicine

## 2023-05-05 ENCOUNTER — Encounter: Payer: Self-pay | Admitting: Internal Medicine

## 2023-05-05 VITALS — BP 110/68 | HR 91 | Temp 97.8°F | Ht 65.0 in | Wt 159.0 lb

## 2023-05-05 DIAGNOSIS — R42 Dizziness and giddiness: Secondary | ICD-10-CM

## 2023-05-05 DIAGNOSIS — Z01818 Encounter for other preprocedural examination: Secondary | ICD-10-CM | POA: Insufficient documentation

## 2023-05-05 DIAGNOSIS — K580 Irritable bowel syndrome with diarrhea: Secondary | ICD-10-CM | POA: Diagnosis not present

## 2023-05-05 HISTORY — DX: Dizziness and giddiness: R42

## 2023-05-05 NOTE — Assessment & Plan Note (Signed)
Actually seems to be more a balance issue--no clear hypotension No change in valsartan Discussed using a cane regularly for balance

## 2023-05-05 NOTE — Progress Notes (Signed)
Subjective:    Patient ID: Wendy Murray, female    DOB: May 29, 1936, 87 y.o.   MRN: [id number]  HPI Here for medical clearance prior to planned parathyroid surgery for adenoma and hyperparathyroidism  BP has been running low-- 138/72 Dizzy upon standing and "lists to one side" Has sense of imbalance--not close to syncope Has canes--but not using regularly No chest pain or SOB  Has had diarrhea lately Saw NP at [facility] diet but persistent symptoms Tried macaroni and cheese yesterday and vomited and then diarrhea This is not new Having daily episodes----anytime she eats She has lost some weight  Current Outpatient Medications on File Prior to Visit  Medication Sig Dispense Refill   albuterol (VENTOLIN HFA) 108 (90 Base) MCG/ACT inhaler Inhale 2 puffs into the lungs every 6 (six) hours as needed for wheezing or shortness of breath. 8 g 2   amoxicillin (AMOXIL) 500 MG tablet Take 4 tablets (2,000 mg total) by mouth daily. 1 hour before dental appointment 20 tablet 0   aspirin 81 MG EC tablet Take 1 tablet (81 mg total) by mouth daily. Swallow whole. 90 tablet 3   atorvastatin (LIPITOR) 40 MG tablet TAKE 1 TABLET BY MOUTH EVERY EVENING 270 tablet 3   clonazePAM (KLONOPIN) 1 MG tablet Take 0.5-1 tablets (0.5-1 mg total) by mouth at bedtime as needed. 30 tablet 0   dicyclomine (BENTYL) 10 MG capsule Take 1 capsule (10 mg total) by mouth 3 (three) times daily before meals. 30 capsule 0   hydroxychloroquine (PLAQUENIL) 200 MG tablet Take 200 mg by mouth 2 (two) times daily.     levothyroxine (SYNTHROID) 75 MCG tablet TAKE 1 TABLET BY MOUTH EVERY DAY 270 tablet 3   mirtazapine (REMERON) 15 MG tablet TAKE 1 TABLET BY MOUTH EVERY DAY AT BEDTIME 270 tablet 3   omeprazole (PRILOSEC) 20 MG capsule Take 1 capsule (20 mg total) by mouth every evening. 90 capsule 3   Probiotic Product (ALIGN) 4 MG CAPS Take 1 capsule by mouth at bedtime.     tiZANidine (ZANAFLEX) 4 MG tablet  Take 1 tablet (4 mg total) by mouth every 8 (eight) hours as needed for muscle spasms. 60 tablet 1   valsartan (DIOVAN) 160 MG tablet Take 1 tablet (160 mg total) by mouth daily. 90 tablet 3   venlafaxine XR (EFFEXOR-XR) 150 MG 24 hr capsule TAKE 1 CAPSULE BY MOUTH EVERY DAY WITH 75 MG CAPSULE 270 capsule 3   venlafaxine XR (EFFEXOR-XR) 75 MG 24 hr capsule TAKE 1 CAPSULE BY MOUTH EVERY DAY ALONG WITH 150 MG CAPSULE 270 capsule 3   No current facility-administered medications on file prior to visit.    No Known Allergies  Past Medical History:  Diagnosis Date   CHF (congestive heart failure) (HCC) 2022   diastolic   COVID-19 05/30/2021   Diverticulosis    DVT (deep venous thrombosis) (HCC) 06/16/2010   after TKR   Hyperlipidemia    Hyperparathyroidism (HCC)    Hypothyroidism    IBS (irritable bowel syndrome)    Inflammatory arthritis    Osteoarthritis, knee    Recurrent major depression (HCC)    TIA (transient ischemic attack) 01/2021    Past Surgical History:  Procedure Laterality Date   ABDOMINAL HYSTERECTOMY  06/16/1989   CATARACT EXTRACTION W/ INTRAOCULAR LENS  IMPLANT, BILATERAL  06/16/2012   CHOLECYSTECTOMY  06/16/1996   COLONOSCOPY     DEXA  08/15/2007   Normal   HAMMER TOE SURGERY Left  10/15/2014   Dr Wendy Murray   LUMBAR FUSION  06/17/1991   MENISCUS REPAIR Left 04/16/2010   TOTAL KNEE ARTHROPLASTY Left 06/16/2010   TOTAL KNEE ARTHROPLASTY Right 07/09/2021   Procedure: TOTAL KNEE ARTHROPLASTY;  Surgeon: Wendy Flake, MD;  Location: [facility] ORS;  Service: Orthopedics;  Laterality: Right;    Family History  Problem Relation Age of Onset   Cancer Mother        colon   Depression Mother    Cancer Father    Depression Sister    Hypertension Neg Hx    Heart disease Neg Hx    Diabetes Neg Hx     Social History   Socioeconomic History   Marital status: Widowed    Spouse name: Not on file   Number of children: 3   Years of education: Not on file   Highest  education level: Master's degree (e.g., MA, MS, MEng, MEd, MSW, MBA)  Occupational History   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    Comment: Retired  Tobacco Use   Smoking status: Former    Current packs/day: 0.00    Types: Cigarettes    Quit date: 06/16/1981    Years since quitting: 41.9    Passive exposure: Past (as a child)   Smokeless tobacco: Never  Vaping Use   Vaping status: Never Used  Substance and Sexual Activity   Alcohol use: Yes    Alcohol/week: 0.0 standard drinks of alcohol    Comment: rarely   Drug use: No   Sexual activity: Not Currently  Other Topics Concern   Not on file  Social History Narrative   Children in [place], [place] and [place]   Widowed 1/23      Has living will    Daughter Wendy Murray, have health care POA   Has DNR ---order rewritten   No feeding tube if cognitively unaware   Will be donating body to [employer]   Social Determinants of Health   Financial Resource Strain: Low Risk  (04/25/2023)   Overall Financial Resource Strain (CARDIA)    Difficulty of Paying Living Expenses: Not hard at all  Food Insecurity: No Food Insecurity (04/25/2023)   Hunger Vital Sign    Worried About Running Out of Food in the Last Year: Never true    Ran Out of Food in the Last Year: Never true  Transportation Needs: No Transportation Needs (04/25/2023)   PRAPARE - Administrator, Civil Service (Medical): No    Lack of Transportation (Non-Medical): No  Physical Activity: Unknown (04/25/2023)   Exercise Vital Sign    Days of Exercise per Week: 0 days    Minutes of Exercise per Session: Not on file  Stress: Stress Concern Present (04/25/2023)   [employer] of Occupational Health - Occupational Stress Questionnaire    Feeling of Stress : To some extent  Social Connections: Moderately Integrated (04/25/2023)   Social Connection and Isolation Panel [NHANES]    Frequency of Communication with Friends and Family: Twice a week     Frequency of Social Gatherings with Friends and Family: Once a week    Attends Religious Services: More than 4 times per year    Active Member of [employer] or Organizations: Yes    Attends Banker Meetings: More than 4 times per year    Marital Status: Widowed  Intimate Partner Violence: Not on file   Review of Systems No foot numbness No urinary symptoms    Objective:  Physical Exam Constitutional:      Appearance: Normal appearance.  Cardiovascular:     Rate and Rhythm: Normal rate and regular rhythm.     Heart sounds: No murmur heard.    No gallop.  Pulmonary:     Effort: Pulmonary effort is normal.     Breath sounds: Normal breath sounds. No wheezing or rales.  Abdominal:     Palpations: Abdomen is soft.     Tenderness: There is no abdominal tenderness. There is no guarding or rebound.  Musculoskeletal:     Cervical back: Neck supple.     Right lower leg: No edema.     Left lower leg: No edema.  Lymphadenopathy:     Cervical: No cervical adenopathy.  Neurological:     Mental Status: She is alert.            Assessment & Plan:

## 2023-05-05 NOTE — Assessment & Plan Note (Addendum)
No worrisome symptoms Will check EKG--sinus at 84. Normal axis and intervals. No hypertrophy or ischemic changes. Essentially the same as 05/21/22  Medically stable for this low risk surgery No special instructions

## 2023-05-05 NOTE — Assessment & Plan Note (Signed)
Still seems functional Is giving the stool samples to check for infection Suggested she try imodium up to once a day (avoiding constipation) Will refer to GI--?collagenous colitis a possibliity

## 2023-05-11 ENCOUNTER — Other Ambulatory Visit: Payer: Self-pay | Admitting: Internal Medicine

## 2023-05-11 LAB — AEROBIC CULTURE

## 2023-05-11 LAB — OVA AND PARASITE EXAMINATION
CONCENTRATE RESULT:: NONE SEEN
MICRO NUMBER:: 15750678
SPECIMEN QUALITY:: ADEQUATE
TRICHROME RESULT:: NONE SEEN

## 2023-05-11 LAB — TIQ-NTM

## 2023-05-11 LAB — CLOSTRIDIUM DIFFICILE TOXIN B, QUALITATIVE, REAL-TIME PCR: Toxigenic C. Difficile by PCR: NOT DETECTED

## 2023-05-12 NOTE — Telephone Encounter (Signed)
Last filled 03-04-23 #30 Last OV 05-05-23 No Future OV [facility] [address]

## 2023-06-15 NOTE — Patient Instructions (Signed)
 DUE TO COVID-19 ONLY TWO VISITORS  (aged 85 and older)  ARE ALLOWED TO COME WITH YOU AND STAY IN THE WAITING ROOM ONLY DURING PRE OP AND PROCEDURE.   **NO VISITORS ARE ALLOWED IN THE SHORT STAY AREA OR RECOVERY ROOM!!**  IF YOU WILL BE ADMITTED INTO THE HOSPITAL YOU ARE ALLOWED ONLY FOUR SUPPORT PEOPLE DURING VISITATION HOURS ONLY (7 AM -8PM)   The support person(s) must pass our screening, gel in and out, and wear a mask at all times, including in the patient's room. Patients must also wear a mask when staff or their support person are in the room. Visitors GUEST BADGE MUST BE WORN VISIBLY  One adult visitor may remain with you overnight and MUST be in the room by 8 P.M.     Your procedure is scheduled on: 06/25/23   Report to [facility] Main Entrance    Report to admitting at : 8:45 AM   Call this number if you have problems the morning of surgery [phone]   Do not eat food :After Midnight.   After Midnight you may have the following liquids until : 8:00 AM DAY OF SURGERY  Water Black Coffee (sugar ok, NO MILK/CREAM OR CREAMERS)  Tea (sugar ok, NO MILK/CREAM OR CREAMERS) regular and decaf                             Plain Jell-O (NO RED)                                           Fruit ices (not with fruit pulp, NO RED)                                     Popsicles (NO RED)                                                                  Juice: apple, WHITE grape, WHITE cranberry Sports drinks like Gatorade (NO RED)             FOLLOW AND ANY ADDITIONAL PRE OP INSTRUCTIONS YOU RECEIVED FROM YOUR SURGEON'S OFFICE!!!   Oral Hygiene is also important to reduce your risk of infection.                                    Remember - BRUSH YOUR TEETH THE MORNING OF SURGERY WITH YOUR REGULAR TOOTHPASTE  DENTURES WILL BE REMOVED PRIOR TO SURGERY PLEASE DO NOT APPLY Poly grip OR ADHESIVES!!!   Do NOT smoke after Midnight   Take these medicines the morning of surgery with A  SIP OF WATER: venlafaxine ,levothyroxine ,omeprazole .Use inhalers as usual.                              You may not have any metal on your body including hair pins, jewelry, and body piercing  Do not wear make-up, lotions, powders, perfumes/cologne, or deodorant  Do not wear nail polish including gel and S&S, artificial/acrylic nails, or any other type of covering on natural nails including finger and toenails. If you have artificial nails, gel coating, etc. that needs to be removed by a nail salon please have this removed prior to surgery or surgery may need to be canceled/ delayed if the surgeon/ anesthesia feels like they are unable to be safely monitored.   Do not shave  48 hours prior to surgery.    Do not bring valuables to the hospital. [facility] IS NOT             RESPONSIBLE   FOR VALUABLES.   Contacts, glasses, or bridgework may not be worn into surgery.   Bring small overnight bag day of surgery.   DO NOT BRING YOUR HOME MEDICATIONS TO THE HOSPITAL. PHARMACY WILL DISPENSE MEDICATIONS LISTED ON YOUR MEDICATION LIST TO YOU DURING YOUR ADMISSION IN THE HOSPITAL!    Patients discharged on the day of surgery will not be allowed to drive home.  Someone NEEDS to stay with you for the first 24 hours after anesthesia.   Special Instructions: Bring a copy of your healthcare power of attorney and living will documents         the day of surgery if you haven't scanned them before.              Please read over the following fact sheets you were given: IF YOU HAVE QUESTIONS ABOUT YOUR PRE-OP INSTRUCTIONS PLEASE CALL [phone]    [facility] - Preparing for Surgery Before surgery, you can play an important role.  Because skin is not sterile, your skin needs to be as free of germs as possible.  You can reduce the number of germs on your skin by washing with CHG (chlorahexidine gluconate) soap before surgery.  CHG is an antiseptic cleaner which kills germs and bonds with the skin  to continue killing germs even after washing. Please DO NOT use if you have an allergy to CHG or antibacterial soaps.  If your skin becomes reddened/irritated stop using the CHG and inform your nurse when you arrive at Short Stay. Do not shave (including legs and underarms) for at least 48 hours prior to the first CHG shower.  You may shave your face/neck. Please follow these instructions carefully:  1.  Shower with CHG Soap the night before surgery and the  morning of Surgery.  2.  If you choose to wash your hair, wash your hair first as usual with your  normal  shampoo.  3.  After you shampoo, rinse your hair and body thoroughly to remove the  shampoo.                           4.  Use CHG as you would any other liquid soap.  You can apply chg directly  to the skin and wash                       Gently with a scrungie or clean washcloth.  5.  Apply the CHG Soap to your body ONLY FROM THE NECK DOWN.   Do not use on face/ open                           Wound or open sores. Avoid contact with eyes,  ears mouth and genitals (private parts).                       Wash face,  Genitals (private parts) with your normal soap.             6.  Wash thoroughly, paying special attention to the area where your surgery  will be performed.  7.  Thoroughly rinse your body with warm water from the neck down.  8.  DO NOT shower/wash with your normal soap after using and rinsing off  the CHG Soap.                9.  Pat yourself dry with a clean towel.            10.  Wear clean pajamas.            11.  Place clean sheets on your bed the night of your first shower and do not  sleep with pets. Day of Surgery : Do not apply any lotions/deodorants the morning of surgery.  Please wear clean clothes to the hospital/surgery center.  FAILURE TO FOLLOW THESE INSTRUCTIONS MAY RESULT IN THE CANCELLATION OF YOUR SURGERY PATIENT SIGNATURE_________________________________  NURSE  SIGNATURE__________________________________  ________________________________________________________________________

## 2023-06-16 ENCOUNTER — Encounter (HOSPITAL_COMMUNITY)
Admission: RE | Admit: 2023-06-16 | Discharge: 2023-06-16 | Disposition: A | Discharge status: Home / Self Care | Payer: Medicare Other | Source: Ambulatory Visit | Attending: Surgery | Admitting: Surgery

## 2023-06-16 ENCOUNTER — Encounter (HOSPITAL_COMMUNITY): Payer: Self-pay

## 2023-06-16 ENCOUNTER — Other Ambulatory Visit: Payer: Self-pay

## 2023-06-16 VITALS — BP 148/86 | HR 91 | Temp 98.6°F | Ht 65.0 in | Wt 161.0 lb

## 2023-06-16 DIAGNOSIS — Z01812 Encounter for preprocedural laboratory examination: Secondary | ICD-10-CM | POA: Diagnosis not present

## 2023-06-16 DIAGNOSIS — I10 Essential (primary) hypertension: Secondary | ICD-10-CM | POA: Insufficient documentation

## 2023-06-16 HISTORY — DX: Essential (primary) hypertension: I10

## 2023-06-16 HISTORY — DX: Dyspnea, unspecified: R06.00

## 2023-06-16 HISTORY — DX: Anxiety disorder, unspecified: F41.9

## 2023-06-16 LAB — CBC
HCT: 39.4 % (ref 36.0–46.0)
Hemoglobin: 12.6 g/dL (ref 12.0–15.0)
MCH: 28.1 pg (ref 26.0–34.0)
MCHC: 32 g/dL (ref 30.0–36.0)
MCV: 87.9 fL (ref 80.0–100.0)
Platelets: 381 10*3/uL (ref 150–400)
RBC: 4.48 MIL/uL (ref 3.87–5.11)
RDW: 15 % (ref 11.5–15.5)
WBC: 7.1 10*3/uL (ref 4.0–10.5)
nRBC: 0 % (ref 0.0–0.2)

## 2023-06-16 LAB — BASIC METABOLIC PANEL
Anion gap: 5 (ref 5–15)
BUN: 16 mg/dL (ref 8–23)
CO2: 27 mmol/L (ref 22–32)
Calcium: 10.2 mg/dL (ref 8.9–10.3)
Chloride: 105 mmol/L (ref 98–111)
Creatinine, Ser: 0.75 mg/dL (ref 0.44–1.00)
GFR, Estimated: >60 mL/min (ref >60)
Glucose, Bld: 105 mg/dL — ABNORMAL HIGH (ref 70–99)
Potassium: 4.4 mmol/L (ref 3.5–5.1)
Sodium: 137 mmol/L (ref 135–145)

## 2023-06-16 NOTE — Progress Notes (Addendum)
 For Anesthesia: PCP - Jimmy Charlie FERNS, MD  Cardiologist - N/A  Bowel Prep reminder:  Chest x-ray - CT chest: 12/13/22 EKG - 05/05/23 Stress Test -  ECHO - 03/23/21 Cardiac Cath -  Pacemaker/ICD device last checked: Pacemaker orders received: Device Rep notified:  Spinal Cord Stimulator:  Sleep Study - N/A CPAP -   Fasting Blood Sugar -  N/A Checks Blood Sugar _____ times a day Date and result of last Hgb A1c-  Last dose of GLP1 agonist-  N/A GLP1 instructions:   Last dose of SGLT-2 inhibitors-  N/A SGLT-2 instructions:   Blood Thinner Instructions: N/A Aspirin  Instructions: will be hold 5 days before surgery. Last Dose:  Activity level: Can go up a flight of stairs and activities of daily living without stopping and without chest pain and/or shortness of breath   Able to exercise without chest pain and/or shortness of breath  Anesthesia review: Hx: HTN,TIA's,DVT.  Patient denies shortness of breath, fever, cough and chest pain at PAT appointment   Patient verbalized understanding of instructions that were given to them at the PAT appointment. Patient was also instructed that they will need to review over the PAT instructions again at home before surgery.

## 2023-06-24 ENCOUNTER — Encounter (HOSPITAL_COMMUNITY): Payer: Self-pay | Admitting: Surgery

## 2023-06-24 NOTE — Progress Notes (Signed)
 88 Spoke with Wendy Murray she was aware of her surgery time change for tomorrow for  1000 and to arrive at 0745.  Told she could have clear liquids until 0700 instead of 0800 and take her medications as instructed during her presurgical testing visit.

## 2023-06-24 NOTE — H&P (Signed)
 REFERRING PHYSICIAN: Jimmy Charlie FERNS, MD  PROVIDER: Maleaha Hughett OZELL SPINNER, MD   Chief Complaint: New Consultation  History of Present Illness:  Patient is referred to my practice by her primary care physician, Dr. Charlie Jimmy. Patient had previously been evaluated in my practice for primary hyperparathyroidism. She had undergone imaging studies including an ultrasound and a sestamibi scan which both failed to identify a parathyroid  adenoma. She did undergo a 4D CT scan of the neck in December 2021. This demonstrated potential candidates for a parathyroid  adenoma. Unfortunately, the patient's husband became ill and she was not able to proceed with surgery at that time. Her husband has since passed away. Patient has continued to be monitored by her primary care physician. Recent laboratories show an elevated intact PTH level of 176 and a persistently elevated calcium  level of 10.7. Vitamin D  level is normal. Patient notes fatigue. She has bone and joint discomfort. She does have osteopenia on bone density scanning. Patient now presents to discuss parathyroid  surgery.  Review of Systems: A complete review of systems was obtained from the patient. I have reviewed this information and discussed as appropriate with the patient. See HPI as well for other ROS.  Review of Systems  Constitutional: Positive for malaise/fatigue.  HENT:  Hoarseness  Eyes: Negative.  Respiratory: Negative.  Cardiovascular: Negative.  Gastrointestinal: Negative.  Genitourinary: Negative.  Musculoskeletal: Positive for joint pain.  Skin: Negative.  Neurological: Negative.  Endo/Heme/Allergies: Negative.  Psychiatric/Behavioral: Negative.    Medical History: Past Medical History:  Diagnosis Date  Depression  Diverticulosis  DVT (deep venous thrombosis) (CMS/HHS-HCC)  History of calcium  pyrophosphate deposition disease (CPPD)  Hyperlipidemia  Hyperparathyroidism (CMS/HHS-HCC)  Hypothyroidism  IBS  (irritable bowel syndrome)  Inflammatory arthritis  Thyroid  disease  TIA (transient ischemic attack)   Patient Active Problem List  Diagnosis  Hyperlipidemia  Hypothyroidism  Primary hyperparathyroidism (CMS/HHS-HCC)  GERD (gastroesophageal reflux disease)  Osteoarthritis of right knee  Recurrent major depression (CMS-HCC)  Sensory hearing loss, bilateral  Neck pain  TIA (transient ischemic attack)  Status post total knee replacement using cement, right   Past Surgical History:  Procedure Laterality Date  EXPLORATION SPINAL FUSION 1990  Dr. Elsie Coupe, [facility]  Right TKA using all-cemented [employer] Vanguard system with a 67.5 mm mm PCR femur, a 71 mm tibial tray with a 12 mm anterior stabilized E-poly insert, and a 31 x 6.2 mm all-poly 3-pegged domed patella Right 07/09/2021  Dr.Poggi  CATARACT EXTRACTION W/ INTRAOCULAR LENS IMPLANT & ANTERIOR VITRECTOMY, BILATERAL  CHOLECYSTECTOMY  hammer toe surgery  HYSTERECTOMY  Left total knee arthroplasty  Dr. Lannis Sharps, [facility]  meniscus repair    No Known Allergies  Current Outpatient Medications on File Prior to Visit  Medication Sig Dispense Refill  acetaminophen  (TYLENOL ) 500 MG tablet Take 500 mg by mouth every 12 (twelve) hours as needed for Pain  albuterol  90 mcg/actuation inhaler Inhale 2 inhalations into the lungs every 6 (six) hours as needed  atorvastatin  (LIPITOR) 40 MG tablet Take 40 mg by mouth at bedtime  cholecalciferol  (VITAMIN D3) 5,000 unit capsule Take 5,000 Units by mouth once daily  clonazePAM  (KLONOPIN ) 1 MG tablet Take 1 mg by mouth 2 (two) times daily as needed for Anxiety  levothyroxine  (SYNTHROID , LEVOTHROID) 75 MCG tablet Take 75 mcg by mouth at bedtime  mirtazapine  (REMERON ) 15 MG tablet Take 15 mg by mouth at bedtime  multivitamin tablet Take 1 tablet by mouth once daily  omeprazole  (PRILOSEC) 20 MG  DR capsule Take 20 mg by mouth at bedtime  valsartan  (DIOVAN ) 160 MG  tablet Take 160 mg by mouth once daily  venlafaxine  (EFFEXOR -XR) 150 MG XR capsule Take 150 mg by mouth at bedtime  venlafaxine  (EFFEXOR -XR) 75 MG XR capsule Take 75 mg by mouth once daily  aspirin  81 MG EC tablet Take 81 mg by mouth once daily (Patient not taking: Reported on 04/28/2023)  Bifidobacterium infantis (ALIGN) 4 mg capsule Take 1 capsule by mouth at bedtime (Patient not taking: Reported on 04/28/2023)  clonazePAM  (KLONOPIN ) 1 MG tablet Take 1 mg by mouth at bedtime (Patient not taking: Reported on 04/28/2023)  ferrous sulfate  142 mg (45 mg iron) TbER Take by mouth (Patient not taking: Reported on 04/28/2023)  meloxicam (MOBIC) 15 MG tablet TAKE 1 TABLET(15 MG) BY MOUTH EVERY DAY AS NEEDED FOR PAIN (Patient not taking: Reported on 04/28/2023) 30 tablet 0  ondansetron  (ZOFRAN ) 4 MG tablet Take 4 mg by mouth every 6 (six) hours as needed (Patient not taking: Reported on 04/28/2023)  tiZANidine  (ZANAFLEX ) 4 MG tablet Take by mouth Take 1 tablet (4 mg total) by mouth every 8 (eight) hours as needed for muscle spasms (Patient not taking: Reported on 04/28/2023)  valsartan  (DIOVAN ) 80 MG tablet Take 80 mg by mouth once daily (Patient not taking: Reported on 04/28/2023)   No current facility-administered medications on file prior to visit.   Family History  Problem Relation Age of Onset  Cancer Mother  Depression Mother  Cancer Father  Depression Sister    Social History   Tobacco Use  Smoking Status Former  Current packs/day: 0.00  Average packs/day: 1 pack/day for 25.0 years (25.0 ttl pk-yrs)  Types: Cigarettes  Start date: 96  Quit date: 4  Years since quitting: 48.8  Smokeless Tobacco Never    Social History   Socioeconomic History  Marital status: Widowed  Spouse name: Alm  Number of children: 3  Years of education: 18  Highest education level: Bachelor's degree (e.g., BA, AB, BS)  Occupational History  Occupation: Retired- Warden/ranger  Tobacco Use   Smoking status: Former  Current packs/day: 0.00  Average packs/day: 1 pack/day for 25.0 years (25.0 ttl pk-yrs)  Types: Cigarettes  Start date: 74  Quit date: 1976  Years since quitting: 48.8  Smokeless tobacco: Never  Vaping Use  Vaping status: Never Used  Substance and Sexual Activity  Alcohol use: Yes  Comment: once a month  Drug use: No  Sexual activity: Defer  Partners: Male   Social Drivers of Health   Financial Resource Strain: Low Risk (04/25/2023)  Received from [facility]  Overall Financial Resource Strain (CARDIA)  Difficulty of Paying Living Expenses: Not hard at all  Food Insecurity: No Food Insecurity (04/25/2023)  Received from [facility]  Hunger Vital Sign  Worried About Running Out of Food in the Last Year: Never true  Ran Out of Food in the Last Year: Never true  Transportation Needs: No Transportation Needs (04/25/2023)  Received from [facility] - Transportation  Lack of Transportation (Medical): No  Lack of Transportation (Non-Medical): No  Physical Activity: Unknown (04/25/2023)  Received from [facility]  Exercise Vital Sign  Days of Exercise per Week: 0 days  Stress: Stress Concern Present (04/25/2023)  Received from [facility] - Occupational Stress Questionnaire  Feeling of Stress : To some extent  Social Connections: Moderately Integrated (04/25/2023)  Received from [facility]  Social Connection and Isolation  Panel [NHANES]  Frequency of Communication with Friends and Family: Twice a week  Frequency of Social Gatherings with Friends and Family: Once a week  Attends Religious Services: More than 4 times per year  Active Member of [employer] or Organizations: Yes  Attends Banker Meetings: More than 4 times per year  Marital Status: Widowed   Objective:   Vitals:  BP: 138/78  Pulse: 110  Temp: 36.9 C (98.4 F)  SpO2: 98%  Weight: 73.7 kg (162 lb 6.4 oz)  Height: 165.1 cm  (5' 5)  PainSc: 0-No pain   Body mass index is 27.02 kg/m.  Physical Exam   GENERAL APPEARANCE Comfortable, no acute issues Development: normal Gross deformities: none  SKIN Rash, lesions, ulcers: none Induration, erythema: none Nodules: none palpable  EYES Conjunctiva and lids: normal Pupils: equal  EARS, NOSE, MOUTH, THROAT External ears: no lesion or deformity External nose: no lesion or deformity Hearing: grossly normal  NECK Symmetric: yes Trachea: midline Thyroid : no palpable nodules in the thyroid  bed  CHEST/CV Not assessed  ABDOMEN Not assessed  GENITOURINARY/RECTAL Not assessed  MUSCULOSKELETAL Station and gait: normal Digits and nails: no clubbing or cyanosis Muscle strength: grossly normal all extremities Deformity: none  LYMPHATIC Cervical: none palpable Supraclavicular: none palpable  PSYCHIATRIC Oriented to person, place, and time: yes Mood and affect: normal for situation Judgment and insight: appropriate for situation   Assessment and Plan:   Primary hyperparathyroidism (CMS/HHS-HCC)  Patient is referred by her primary care physician for surgical evaluation and management of primary hyperparathyroidism.  Today we reviewed her clinical history. We reviewed her imaging studies. We reviewed recent laboratory studies. We discussed proceeding with neck exploration and parathyroidectomy. Given her age and the fact that she lives out of town, we will plan for an overnight hospital stay for observation. We discussed the procedure. We discussed the size and location of the surgical incision. We discussed the potential for complications including recurrent laryngeal nerve injury with resulting hoarseness. We discussed her postoperative recovery and hopefully normalization of her laboratory studies. Patient understands and wishes to proceed with surgery in the near future.  Krystal Spinner, MD [facility] practice Office:  [phone]

## 2023-06-25 ENCOUNTER — Encounter (HOSPITAL_COMMUNITY)
Admission: RE | Disposition: A | Discharge status: Home / Self Care | Payer: Self-pay | Source: Ambulatory Visit | Attending: Surgery

## 2023-06-25 ENCOUNTER — Encounter (HOSPITAL_COMMUNITY): Payer: Self-pay | Admitting: Surgery

## 2023-06-25 ENCOUNTER — Ambulatory Visit (HOSPITAL_BASED_OUTPATIENT_CLINIC_OR_DEPARTMENT_OTHER): Payer: Medicare Other | Admitting: Certified Registered"

## 2023-06-25 ENCOUNTER — Ambulatory Visit (HOSPITAL_COMMUNITY)
Admission: RE | Admit: 2023-06-25 | Discharge: 2023-06-26 | Disposition: A | Discharge status: Home / Self Care | Payer: Medicare Other | Source: Ambulatory Visit | Attending: Surgery | Admitting: Surgery

## 2023-06-25 ENCOUNTER — Ambulatory Visit (HOSPITAL_COMMUNITY): Payer: Medicare Other | Admitting: Physician Assistant

## 2023-06-25 ENCOUNTER — Other Ambulatory Visit: Payer: Self-pay

## 2023-06-25 DIAGNOSIS — J45909 Unspecified asthma, uncomplicated: Secondary | ICD-10-CM | POA: Diagnosis not present

## 2023-06-25 DIAGNOSIS — E210 Primary hyperparathyroidism: Secondary | ICD-10-CM

## 2023-06-25 DIAGNOSIS — Z87891 Personal history of nicotine dependence: Secondary | ICD-10-CM | POA: Diagnosis not present

## 2023-06-25 DIAGNOSIS — Z8673 Personal history of transient ischemic attack (TIA), and cerebral infarction without residual deficits: Secondary | ICD-10-CM | POA: Diagnosis not present

## 2023-06-25 DIAGNOSIS — I509 Heart failure, unspecified: Secondary | ICD-10-CM | POA: Diagnosis not present

## 2023-06-25 DIAGNOSIS — Z86718 Personal history of other venous thrombosis and embolism: Secondary | ICD-10-CM | POA: Insufficient documentation

## 2023-06-25 DIAGNOSIS — I110 Hypertensive heart disease with heart failure: Secondary | ICD-10-CM

## 2023-06-25 DIAGNOSIS — K219 Gastro-esophageal reflux disease without esophagitis: Secondary | ICD-10-CM | POA: Insufficient documentation

## 2023-06-25 DIAGNOSIS — E785 Hyperlipidemia, unspecified: Secondary | ICD-10-CM | POA: Diagnosis not present

## 2023-06-25 DIAGNOSIS — I5032 Chronic diastolic (congestive) heart failure: Secondary | ICD-10-CM | POA: Diagnosis not present

## 2023-06-25 DIAGNOSIS — D351 Benign neoplasm of parathyroid gland: Secondary | ICD-10-CM | POA: Diagnosis not present

## 2023-06-25 DIAGNOSIS — E213 Hyperparathyroidism, unspecified: Secondary | ICD-10-CM | POA: Diagnosis present

## 2023-06-25 HISTORY — PX: PARATHYROIDECTOMY: SHX19

## 2023-06-25 SURGERY — PARATHYROIDECTOMY
Anesthesia: General | Laterality: Left

## 2023-06-25 MED ORDER — FENTANYL CITRATE (PF) 100 MCG/2ML IJ SOLN
INTRAMUSCULAR | Status: AC
  Filled 2023-06-25: qty 2

## 2023-06-25 MED ORDER — VENLAFAXINE HCL ER 150 MG PO CP24: 150.0000 mg | ORAL_CAPSULE | Freq: Every day | ORAL | Status: DC

## 2023-06-25 MED ORDER — CLONAZEPAM 0.5 MG PO TABS
0.5000 mg | ORAL_TABLET | Freq: Every day | ORAL | Status: DC
  Administered 2023-06-25: 0.5 mg via ORAL
  Filled 2023-06-25: qty 1

## 2023-06-25 MED ORDER — ORAL CARE MOUTH RINSE: 15.0000 mL | Freq: Once | OROMUCOSAL | Status: AC

## 2023-06-25 MED ORDER — CEFAZOLIN SODIUM-DEXTROSE 2-4 GM/100ML-% IV SOLN
2.0000 g | INTRAVENOUS | Status: AC
  Administered 2023-06-25: 2 g via INTRAVENOUS
  Filled 2023-06-25: qty 100

## 2023-06-25 MED ORDER — VENLAFAXINE HCL ER 75 MG PO CP24
225.0000 mg | ORAL_CAPSULE | Freq: Every day | ORAL | Status: DC
  Administered 2023-06-26: 225 mg via ORAL
  Filled 2023-06-25: qty 3

## 2023-06-25 MED ORDER — ROCURONIUM BROMIDE 10 MG/ML (PF) SYRINGE
PREFILLED_SYRINGE | INTRAVENOUS | Status: AC
  Filled 2023-06-25: qty 10

## 2023-06-25 MED ORDER — FENTANYL CITRATE (PF) 100 MCG/2ML IJ SOLN
INTRAMUSCULAR | Status: DC | PRN
  Administered 2023-06-25: 25 ug via INTRAVENOUS
  Administered 2023-06-25: 100 ug via INTRAVENOUS
  Administered 2023-06-25: 50 ug via INTRAVENOUS
  Administered 2023-06-25: 25 ug via INTRAVENOUS

## 2023-06-25 MED ORDER — OXYCODONE HCL 5 MG PO TABS: 5.0000 mg | ORAL_TABLET | ORAL | Status: DC | PRN

## 2023-06-25 MED ORDER — LIDOCAINE HCL (PF) 2 % IJ SOLN
INTRAMUSCULAR | Status: AC
  Filled 2023-06-25: qty 5

## 2023-06-25 MED ORDER — ONDANSETRON 4 MG PO TBDP: 4.0000 mg | ORAL_TABLET | Freq: Four times a day (QID) | ORAL | Status: DC | PRN

## 2023-06-25 MED ORDER — ACETAMINOPHEN 10 MG/ML IV SOLN
INTRAVENOUS | Status: AC
  Filled 2023-06-25: qty 100

## 2023-06-25 MED ORDER — LACTATED RINGERS IV SOLN: INTRAVENOUS | Status: DC

## 2023-06-25 MED ORDER — SUGAMMADEX SODIUM 200 MG/2ML IV SOLN
INTRAVENOUS | Status: DC | PRN
  Administered 2023-06-25: 140 mg via INTRAVENOUS

## 2023-06-25 MED ORDER — PROPOFOL 10 MG/ML IV BOLUS
INTRAVENOUS | Status: AC
  Filled 2023-06-25: qty 20

## 2023-06-25 MED ORDER — SODIUM CHLORIDE 0.9 % IV SOLN: INTRAVENOUS | Status: DC | PRN

## 2023-06-25 MED ORDER — BUPIVACAINE HCL (PF) 0.25 % IJ SOLN
INTRAMUSCULAR | Status: AC
  Filled 2023-06-25: qty 30

## 2023-06-25 MED ORDER — DEXTROSE-SODIUM CHLORIDE 5-0.9 % IV SOLN: INTRAVENOUS | Status: DC

## 2023-06-25 MED ORDER — TRAMADOL HCL 50 MG PO TABS
50.0000 mg | ORAL_TABLET | Freq: Four times a day (QID) | ORAL | Status: DC | PRN
  Administered 2023-06-26: 50 mg via ORAL
  Filled 2023-06-25: qty 1

## 2023-06-25 MED ORDER — DEXAMETHASONE SODIUM PHOSPHATE 10 MG/ML IJ SOLN
INTRAMUSCULAR | Status: AC
  Filled 2023-06-25: qty 1

## 2023-06-25 MED ORDER — PANTOPRAZOLE SODIUM 40 MG PO TBEC
40.0000 mg | DELAYED_RELEASE_TABLET | Freq: Every day | ORAL | Status: DC
Start: 2023-06-25 — End: 2023-06-26
  Administered 2023-06-25: 40 mg via ORAL
  Filled 2023-06-25: qty 1

## 2023-06-25 MED ORDER — 0.9 % SODIUM CHLORIDE (POUR BTL) OPTIME
TOPICAL | Status: DC | PRN
  Administered 2023-06-25: 1000 mL

## 2023-06-25 MED ORDER — ONDANSETRON HCL 4 MG/2ML IJ SOLN: 4.0000 mg | Freq: Four times a day (QID) | INTRAMUSCULAR | Status: DC | PRN

## 2023-06-25 MED ORDER — LIDOCAINE 2% (20 MG/ML) 5 ML SYRINGE
INTRAMUSCULAR | Status: DC | PRN
  Administered 2023-06-25: 40 mg via INTRAVENOUS

## 2023-06-25 MED ORDER — ONDANSETRON HCL 4 MG/2ML IJ SOLN
INTRAMUSCULAR | Status: DC | PRN
  Administered 2023-06-25: 4 mg via INTRAVENOUS

## 2023-06-25 MED ORDER — DEXAMETHASONE SODIUM PHOSPHATE 10 MG/ML IJ SOLN
INTRAMUSCULAR | Status: DC | PRN
  Administered 2023-06-25: 4 mg via INTRAVENOUS

## 2023-06-25 MED ORDER — FENTANYL CITRATE PF 50 MCG/ML IJ SOSY: 25.0000 ug | PREFILLED_SYRINGE | INTRAMUSCULAR | Status: DC | PRN

## 2023-06-25 MED ORDER — ROCURONIUM BROMIDE 10 MG/ML (PF) SYRINGE
PREFILLED_SYRINGE | INTRAVENOUS | Status: DC | PRN
  Administered 2023-06-25: 40 mg via INTRAVENOUS

## 2023-06-25 MED ORDER — IRBESARTAN 150 MG PO TABS
150.0000 mg | ORAL_TABLET | Freq: Every day | ORAL | Status: DC
  Administered 2023-06-26: 150 mg via ORAL
  Filled 2023-06-25: qty 1

## 2023-06-25 MED ORDER — HEMOSTATIC AGENTS (NO CHARGE) OPTIME
TOPICAL | Status: DC | PRN
  Administered 2023-06-25: 1

## 2023-06-25 MED ORDER — ACETAMINOPHEN 650 MG RE SUPP: 650.0000 mg | Freq: Four times a day (QID) | RECTAL | Status: DC | PRN

## 2023-06-25 MED ORDER — EPHEDRINE SULFATE-NACL 50-0.9 MG/10ML-% IV SOSY
PREFILLED_SYRINGE | INTRAVENOUS | Status: DC | PRN
  Administered 2023-06-25: 10 mg via INTRAVENOUS
  Administered 2023-06-25: 5 mg via INTRAVENOUS

## 2023-06-25 MED ORDER — ACETAMINOPHEN 325 MG PO TABS
650.0000 mg | ORAL_TABLET | Freq: Four times a day (QID) | ORAL | Status: DC | PRN
  Administered 2023-06-25: 650 mg via ORAL
  Filled 2023-06-25: qty 2

## 2023-06-25 MED ORDER — HYDROMORPHONE HCL 1 MG/ML IJ SOLN: 1.0000 mg | INTRAMUSCULAR | Status: DC | PRN

## 2023-06-25 MED ORDER — HYDROXYCHLOROQUINE SULFATE 200 MG PO TABS
200.0000 mg | ORAL_TABLET | Freq: Two times a day (BID) | ORAL | Status: DC
  Administered 2023-06-25 – 2023-06-26 (×2): 200 mg via ORAL
  Filled 2023-06-25 (×2): qty 1

## 2023-06-25 MED ORDER — ONDANSETRON HCL 4 MG/2ML IJ SOLN
INTRAMUSCULAR | Status: AC
  Filled 2023-06-25: qty 2

## 2023-06-25 MED ORDER — MIRTAZAPINE 15 MG PO TABS
15.0000 mg | ORAL_TABLET | Freq: Every day | ORAL | Status: DC
  Administered 2023-06-25: 15 mg via ORAL
  Filled 2023-06-25: qty 1

## 2023-06-25 MED ORDER — LABETALOL HCL 5 MG/ML IV SOLN
INTRAVENOUS | Status: DC | PRN
  Administered 2023-06-25: 5 mg via INTRAVENOUS

## 2023-06-25 MED ORDER — CHLORHEXIDINE GLUCONATE CLOTH 2 % EX PADS: 6.0000 | MEDICATED_PAD | Freq: Once | CUTANEOUS | Status: DC

## 2023-06-25 MED ORDER — SUCCINYLCHOLINE CHLORIDE 200 MG/10ML IV SOSY
PREFILLED_SYRINGE | INTRAVENOUS | Status: DC | PRN
  Administered 2023-06-25: 100 mg via INTRAVENOUS

## 2023-06-25 MED ORDER — PROPOFOL 10 MG/ML IV BOLUS
INTRAVENOUS | Status: DC | PRN
  Administered 2023-06-25: 140 mg via INTRAVENOUS

## 2023-06-25 MED ORDER — ACETAMINOPHEN 10 MG/ML IV SOLN
1000.0000 mg | Freq: Once | INTRAVENOUS | Status: DC | PRN
  Administered 2023-06-25: 1000 mg via INTRAVENOUS

## 2023-06-25 MED ORDER — CHLORHEXIDINE GLUCONATE 0.12 % MT SOLN
15.0000 mL | Freq: Once | OROMUCOSAL | Status: AC
  Administered 2023-06-25: 15 mL via OROMUCOSAL

## 2023-06-25 MED ORDER — LEVOTHYROXINE SODIUM 75 MCG PO TABS
75.0000 ug | ORAL_TABLET | Freq: Every day | ORAL | Status: DC
  Administered 2023-06-26: 75 ug via ORAL
  Filled 2023-06-25: qty 1

## 2023-06-25 SURGICAL SUPPLY — 30 items
ATTRACTOMAT 16X20 MAGNETIC DRP (DRAPES) ×2 IMPLANT
BAG COUNTER SPONGE SURGICOUNT (BAG) ×2 IMPLANT
BLADE SURG 15 STRL LF DISP TIS (BLADE) ×2 IMPLANT
CHLORAPREP W/TINT 26 (MISCELLANEOUS) ×2 IMPLANT
CLIP TI MEDIUM 6 (CLIP) ×4 IMPLANT
CLIP TI WIDE RED SMALL 6 (CLIP) ×4 IMPLANT
COVER SURGICAL LIGHT HANDLE (MISCELLANEOUS) ×2 IMPLANT
DERMABOND ADVANCED .7 DNX12 (GAUZE/BANDAGES/DRESSINGS) ×2 IMPLANT
DRAPE LAPAROTOMY T 98X78 PEDS (DRAPES) ×2 IMPLANT
DRAPE UTILITY XL STRL (DRAPES) ×2 IMPLANT
ELECT PENCIL ROCKER SW 15FT (MISCELLANEOUS) ×1 IMPLANT
ELECT REM PT RETURN 15FT ADLT (MISCELLANEOUS) ×2 IMPLANT
GAUZE 4X4 16PLY ~~LOC~~+RFID DBL (SPONGE) ×2 IMPLANT
GLOVE SURG ORTHO 8.0 STRL STRW (GLOVE) ×2 IMPLANT
GOWN STRL REUS W/ TWL XL LVL3 (GOWN DISPOSABLE) ×6 IMPLANT
HEMOSTAT SURGICEL 2X4 FIBR (HEMOSTASIS) ×2 IMPLANT
ILLUMINATOR WAVEGUIDE N/F (MISCELLANEOUS) ×1 IMPLANT
KIT BASIN OR (CUSTOM PROCEDURE TRAY) ×2 IMPLANT
KIT TURNOVER KIT A (KITS) IMPLANT
NDL HYPO 22X1.5 SAFETY MO (MISCELLANEOUS) ×1 IMPLANT
NEEDLE HYPO 22X1.5 SAFETY MO (MISCELLANEOUS) ×2
PACK BASIC VI WITH GOWN DISP (CUSTOM PROCEDURE TRAY) ×2 IMPLANT
PENCIL SMOKE EVACUATOR (MISCELLANEOUS) ×1 IMPLANT
SHEARS HARMONIC 9CM CVD (BLADE) IMPLANT
SUT MNCRL AB 4-0 PS2 18 (SUTURE) ×2 IMPLANT
SUT VIC AB 3-0 SH 18 (SUTURE) ×3 IMPLANT
SYR BULB IRRIG 60ML STRL (SYRINGE) ×2 IMPLANT
SYR CONTROL 10ML LL (SYRINGE) ×2 IMPLANT
TOWEL OR 17X26 10 PK STRL BLUE (TOWEL DISPOSABLE) ×2 IMPLANT
TUBING CONNECTING 10 (TUBING) ×2 IMPLANT

## 2023-06-25 NOTE — Interval H&P Note (Signed)
 History and Physical Interval Note:  06/25/2023 9:15 AM  Wendy Murray  has presented today for surgery, with the diagnosis of PRIMARY HYPERPARATHYROIDISM.  The various methods of treatment have been discussed with the patient and family. After consideration of risks, benefits and other options for treatment, the patient has consented to    Procedure(s): PARATHYROID  EXPLORATION (N/A) NECK EXPLORATION WITH PARATHYROIDECTOMY (N/A) as a surgical intervention.    [name] history has been reviewed, patient examined, no change in status, stable for surgery.  I have reviewed the patient's chart and labs.  Questions were answered to the patient's satisfaction.    Krystal Spinner, MD [facility] practice Office: [phone]   Krystal Spinner

## 2023-06-25 NOTE — Anesthesia Procedure Notes (Addendum)
 Procedure Name: Intubation Date/Time: 06/25/2023 9:52 AM  Performed by: Metta Andrea NOVAK, CRNAPre-anesthesia Checklist: Patient identified, Emergency Drugs available, Suction available, Patient being monitored and Timeout performed Patient Re-evaluated:Patient Re-evaluated prior to induction Oxygen Delivery Method: Circle system utilized Preoxygenation: Pre-oxygenation with 100% oxygen Induction Type: IV induction and Rapid sequence Laryngoscope Size: Mac and 4 Grade View: Grade I Tube type: Oral Tube size: 7.0 mm Number of attempts: 1 Airway Equipment and Method: Stylet Placement Confirmation: ETT inserted through vocal cords under direct vision, positive ETCO2 and breath sounds checked- equal and bilateral Secured at: 21 cm Tube secured with: Tape Dental Injury: Teeth and Oropharynx as per pre-operative assessment

## 2023-06-25 NOTE — Anesthesia Preprocedure Evaluation (Signed)
 Anesthesia Evaluation  Patient identified by MRN, date of birth, ID band Patient awake    Reviewed: Allergy & Precautions, NPO status , Patient's Chart, lab work & pertinent test results  Airway Mallampati: II       Dental no notable dental hx.    Pulmonary asthma , former smoker   Pulmonary exam normal        Cardiovascular hypertension, Pt. on medications +CHF and + DVT   Rhythm:Regular Rate:Normal     Neuro/Psych   Anxiety Depression    TIA   GI/Hepatic Neg liver ROS,GERD  Medicated,,  Endo/Other  Hypothyroidism    Renal/GU negative Renal ROS  negative genitourinary   Musculoskeletal  (+) Arthritis , Osteoarthritis,    Abdominal Normal abdominal exam  (+)   Peds  Hematology Lab Results      Component                Value               Date                      WBC                      7.1                 06/16/2023                HGB                      12.6                06/16/2023                HCT                      39.4                06/16/2023                MCV                      87.9                06/16/2023                PLT                      381                 06/16/2023              Anesthesia Other Findings   Reproductive/Obstetrics                             Anesthesia Physical Anesthesia Plan  ASA: 3  Anesthesia Plan: General   Post-op Pain Management:    Induction: Intravenous  PONV Risk Score and Plan: 3 and Ondansetron , Dexamethasone  and Treatment may vary due to age or medical condition  Airway Management Planned: Mask and Oral ETT  Additional Equipment: None  Intra-op Plan:   Post-operative Plan: Extubation in OR  Informed Consent: I have reviewed the patients History and Physical, chart, labs and discussed the procedure including the risks, benefits and alternatives for the proposed anesthesia with the patient or authorized  representative who has  indicated his/her understanding and acceptance.     Dental advisory given  Plan Discussed with: CRNA  Anesthesia Plan Comments:        Anesthesia Quick Evaluation

## 2023-06-25 NOTE — Op Note (Signed)
 Operative Note  Pre-operative Diagnosis:  primary hyperparathyroidism   Post-operative Diagnosis:  same  Surgeon:  Wendy Spinner, MD  Assistant:  none   Procedure:  Neck exploration with left superior parathyroidectomy  Anesthesia:  general  Estimated Blood Loss:  < 10 cc  Drains: none         Specimen: to pathology  Indications:  Patient is referred to my practice by her primary care physician, Dr. Charlie Denise. Patient had previously been evaluated in my practice for primary hyperparathyroidism. She had undergone imaging studies including an ultrasound and a sestamibi scan which both failed to identify a parathyroid  adenoma. She did undergo a 4D CT scan of the neck in December 2021. This demonstrated potential candidates for a parathyroid  adenoma. Unfortunately, the patient's husband became ill and she was not able to proceed with surgery at that time. Her husband has since passed away. Patient has continued to be monitored by her primary care physician. Recent laboratories show an elevated intact PTH level of 176 and a persistently elevated calcium  level of 10.7. Vitamin D  level is normal. Patient notes fatigue. She has bone and joint discomfort. She does have osteopenia on bone density scanning. Patient now presents to discuss parathyroid  surgery.   Procedure:  The patient was seen in the pre-op holding area. The risks, benefits, complications, treatment options, and expected outcomes were previously discussed with the patient. The patient agreed with the proposed plan and has signed the informed consent form.  The patient was brought to the operating room by the surgical team, identified as Wendy Murray and the procedure verified. A time out was completed and the above information confirmed.  Following induction of general anesthesia, the patient was positioned and then prepped and draped in the usual aseptic fashion.  After ascertaining that an adequate level of anesthesia been  achieved, a small Kocher incision is made with a #15 blade.  Dissection is carried through subcutaneous tissues and platysma.  Hemostasis is achieved with the electrocautery.  Skin flaps are elevated cephalad and caudad and self-retaining retractors are placed for exposure.  Strap muscles are incised in the midline.  Dissection has begun on the left side.  Strap muscles are reflected laterally.  There is a relatively small atrophic left thyroid  lobe.  Exploration reveals what appears to be a small normal parathyroid  at the inferior pole.  Further dissection is carried posteriorly to the precervical fascia.  Inferior thyroid  artery is dissected out.  Lateral esophagus is dissected out.  Dissection is carried inferiorly where an enlarged parathyroid  gland is identified adherent to the lateral aspect of the esophagus.  The vascular pedicle arises superiorly above the level of the inferior thyroid  artery.  This is consistent with findings on CT scan.  The gland is dissected off of the esophagus and brought above the level of the inferior thyroid  artery.  Vascular pedicle was divided between ligaclips with the cautery.  The gland measures approximately 2 to 2-1/2 cm in greatest dimension.  It is submitted to pathology where frozen section confirms hypercellular parathyroid  tissue.  Next we turned our attention to the right side.  Strap muscles are again reflected laterally.  Right thyroid  lobe is mildly atrophic.  It is gently mobilized.  There appears to be a normal right superior parathyroid  gland located on the posterior aspect of the thyroid  lobe just above the level of the inferior thyroid  artery.  Exploration inferiorly fails to reveal any evidence of enlarged parathyroid  tissue.  The space posterior to  the manubrium is opened and explored.  Dissection is carried down to the vasculature.  There is no evidence of enlarged parathyroid  tissue in this location.  Good hemostasis is achieved throughout the  operative field.  Fibrillar is placed throughout the operative field.  Strap muscles are reapproximated in the midline of interrupted 3-0 Vicryl sutures.  Platysma was closed with interrupted 3-0 Vicryl sutures.  Skin is closed with a running 4-0 Monocryl subcuticular suture.  Wound was washed and dried and Dermabond is applied as dressing.  Patient is awakened from anesthesia and transferred to the recovery room in stable condition.  The patient tolerated the procedure well.   Wendy Spinner, MD [facility] Office: [phone]

## 2023-06-25 NOTE — Transfer of Care (Signed)
 Immediate Anesthesia Transfer of Care Note  Patient: Wendy Murray  Procedure(s) Performed: NECK EXPLORATION WITH LEFT SUPERIOR PARATHYROIDECTOMY (Left)  Patient Location: PACU  Anesthesia Type:General  Level of Consciousness: awake, alert , and patient cooperative  Airway & Oxygen Therapy: Patient Spontanous Breathing and Patient connected to face mask oxygen  Post-op Assessment: Report given to RN and Post -op Vital signs reviewed and stable  Post vital signs: Reviewed and stable  Last Vitals:  Vitals Value Taken Time  BP 175/79 06/25/23 1123  Temp 37.3 C 06/25/23 1123  Pulse 86 06/25/23 1125  Resp 12 06/25/23 1125  SpO2 100 % 06/25/23 1125  Vitals shown include unfiled device data.  Last Pain:  Vitals:   06/25/23 0812  TempSrc: Oral  PainSc: 0-No pain         Complications: No notable events documented.

## 2023-06-26 ENCOUNTER — Encounter (HOSPITAL_COMMUNITY): Payer: Self-pay | Admitting: Surgery

## 2023-06-26 ENCOUNTER — Ambulatory Visit: Payer: Medicare Other | Admitting: Student

## 2023-06-26 DIAGNOSIS — Z87891 Personal history of nicotine dependence: Secondary | ICD-10-CM | POA: Diagnosis not present

## 2023-06-26 DIAGNOSIS — I110 Hypertensive heart disease with heart failure: Secondary | ICD-10-CM | POA: Diagnosis not present

## 2023-06-26 DIAGNOSIS — K219 Gastro-esophageal reflux disease without esophagitis: Secondary | ICD-10-CM | POA: Diagnosis not present

## 2023-06-26 DIAGNOSIS — I509 Heart failure, unspecified: Secondary | ICD-10-CM | POA: Diagnosis not present

## 2023-06-26 DIAGNOSIS — E210 Primary hyperparathyroidism: Secondary | ICD-10-CM | POA: Diagnosis not present

## 2023-06-26 DIAGNOSIS — D351 Benign neoplasm of parathyroid gland: Secondary | ICD-10-CM | POA: Diagnosis not present

## 2023-06-26 LAB — CALCIUM: Calcium: 8.7 mg/dL — ABNORMAL LOW (ref 8.9–10.3)

## 2023-06-26 LAB — SURGICAL PATHOLOGY

## 2023-06-26 MED ORDER — OXYCODONE HCL 5 MG PO TABS
5.0000 mg | ORAL_TABLET | ORAL | Status: DC | PRN
Start: 2023-06-26 — End: 2023-06-26
  Administered 2023-06-26: 10 mg via ORAL
  Filled 2023-06-26: qty 2

## 2023-06-26 MED ORDER — TRAMADOL HCL 50 MG PO TABS: 50.0000 mg | ORAL_TABLET | Freq: Four times a day (QID) | ORAL | 0 refills | Status: DC | PRN

## 2023-06-26 NOTE — TOC Transition Note (Signed)
 Transition of Care [facility]) - Discharge Note   Patient Details  Name: Wendy Murray MRN: [id number] Date of Birth: 06-Aug-1935  [facility]) CM/SW Contact:  Alfonse JONELLE Rex, RN Phone Number: 06/26/2023, 11:40 AM   Clinical Narrative:   Patient discharged prior to [facility] initial assessment.           Patient Goals and CMS Choice            Discharge Placement                       Discharge Plan and Services Additional resources added to the After Visit Summary for                                       Social Drivers of Health (SDOH) Interventions SDOH Screenings   Food Insecurity: Patient Declined (06/25/2023)  Housing: Patient Declined (06/25/2023)  Transportation Needs: Patient Declined (06/25/2023)  Utilities: Patient Declined (06/25/2023)  Depression (PHQ2-9): Low Risk  (11/06/2022)  Financial Resource Strain: Low Risk  (06/22/2023)  Physical Activity: Unknown (06/22/2023)  Social Connections: Patient Declined (06/25/2023)  Recent Concern: Social Connections - Moderately Isolated (06/22/2023)  Stress: Stress Concern Present (06/22/2023)  Tobacco Use: Medium Risk (06/25/2023)     Readmission Risk Interventions     No data to display

## 2023-06-26 NOTE — Anesthesia Postprocedure Evaluation (Signed)
 Anesthesia Post Note  Patient: Wendy Murray  Procedure(s) Performed: NECK EXPLORATION WITH LEFT SUPERIOR PARATHYROIDECTOMY (Left)     Patient location during evaluation: PACU Anesthesia Type: General Level of consciousness: awake and alert Pain management: pain level controlled Vital Signs Assessment: post-procedure vital signs reviewed and stable Respiratory status: spontaneous breathing, nonlabored ventilation, respiratory function stable and patient connected to nasal cannula oxygen Cardiovascular status: blood pressure returned to baseline and stable Postop Assessment: no apparent nausea or vomiting Anesthetic complications: no   No notable events documented.  Last Vitals:  Vitals:   06/25/23 2251 06/26/23 0556  BP: (!) 132/59 (!) 151/78  Pulse: 88 89  Resp: 18 20  Temp: 36.8 C 36.6 C  SpO2: 91% 94%    Last Pain:  Vitals:   06/26/23 0946  TempSrc:   PainSc: 4                  [name] P Kamilo Och

## 2023-06-26 NOTE — Progress Notes (Signed)
Reviewed written d/c paperwork with pt and all questions answered. Pt verbalized understanding. Pt left in stable condition with all belongings.

## 2023-06-26 NOTE — Discharge Summary (Signed)
 Physician Discharge Summary   Patient ID: Wendy Murray MRN: [id number] DOB/AGE: Mar 31, 1936 88 y.o.  Admit date: 06/25/2023  Discharge date: 06/26/2023  Discharge Diagnoses:  Principal Problem:   Hyperparathyroidism [facility]) Active Problems:   Primary hyperparathyroidism [facility])   Discharged Condition: good  Hospital Course: Patient was admitted for observation following neck exploration and parathyroidectomy.  Post op course was uncomplicated.  Pain was well controlled.  Tolerated diet.  Post op calcium  level on morning following surgery was 8.7 mg/dl.  Patient was prepared for discharge home on POD#1.  Consults: None  Treatments: surgery: neck exploration and parathyroidectomy  Discharge Exam: Blood pressure (!) 151/78, pulse 89, temperature 97.9 F (36.6 C), temperature source Oral, resp. rate 20, height 5' 5 (1.651 m), weight 73 kg, SpO2 94%. HEENT - clear Neck - wound dry and intact; mild ecchymosis; voice normal  Disposition: Home  Discharge Instructions     Diet - low sodium heart healthy   Complete by: As directed    Increase activity slowly   Complete by: As directed    No dressing needed   Complete by: As directed       Allergies as of 06/26/2023   No Known Allergies      Medication List     TAKE these medications    albuterol  108 (90 Base) MCG/ACT inhaler Commonly known as: VENTOLIN  HFA Inhale 2 puffs into the lungs every 6 (six) hours as needed for wheezing or shortness of breath.   Align 4 MG Caps Take 4 mg by mouth at bedtime.   amoxicillin  500 MG tablet Commonly known as: AMOXIL  Take 4 tablets (2,000 mg total) by mouth daily. 1 hour before dental appointment   aspirin -acetaminophen -caffeine 250-250-65 MG tablet Commonly known as: EXCEDRIN MIGRAINE Take 1 tablet by mouth every 6 (six) hours as needed for headache.   atorvastatin  40 MG tablet Commonly known as: LIPITOR TAKE 1 TABLET BY MOUTH EVERY EVENING   cholecalciferol  25 MCG  (1000 UNIT) tablet Commonly known as: VITAMIN D3 Take 1,000 Units by mouth daily.   clonazePAM  1 MG tablet Commonly known as: KLONOPIN  TAKE 1/2 TO 1 TABLET(0.5 TO 1 MG) BY MOUTH AT BEDTIME AS NEEDED What changed: See the new instructions.   ferrous sulfate  325 (65 FE) MG tablet Take 325 mg by mouth daily with breakfast.   folic acid 400 MCG tablet Commonly known as: FOLVITE Take 400 mcg by mouth daily.   hydroxychloroquine  200 MG tablet Commonly known as: PLAQUENIL  Take 200 mg by mouth 2 (two) times daily.   levothyroxine  75 MCG tablet Commonly known as: SYNTHROID  TAKE 1 TABLET BY MOUTH EVERY DAY   mirtazapine  15 MG tablet Commonly known as: REMERON  TAKE 1 TABLET BY MOUTH EVERY DAY AT BEDTIME   multivitamin with minerals tablet Take 2 tablets by mouth daily. Gummy   IMMUNE SUPPORT PO Take 1 tablet by mouth daily. With Zinc    omeprazole  20 MG capsule Commonly known as: PRILOSEC Take 1 capsule (20 mg total) by mouth every evening.   tiZANidine  4 MG tablet Commonly known as: ZANAFLEX  Take 1 tablet (4 mg total) by mouth every 8 (eight) hours as needed for muscle spasms.   traMADol  50 MG tablet Commonly known as: ULTRAM  Take 1 tablet (50 mg total) by mouth every 6 (six) hours as needed for moderate pain (pain score 4-6).   valsartan  160 MG tablet Commonly known as: DIOVAN  Take 1 tablet (160 mg total) by mouth daily.   venlafaxine  XR 150  MG 24 hr capsule Commonly known as: EFFEXOR -XR TAKE 1 CAPSULE BY MOUTH EVERY DAY WITH 75 MG CAPSULE   venlafaxine  XR 75 MG 24 hr capsule Commonly known as: EFFEXOR -XR TAKE 1 CAPSULE BY MOUTH EVERY DAY ALONG WITH 150 MG CAPSULE               Discharge Care Instructions  (From admission, onward)           Start     Ordered   06/26/23 0000  No dressing needed        06/26/23 1059            Follow-up Information     Eletha Boas, MD. Schedule an appointment as soon as possible for a visit in 3 week(s).    Specialty: General Surgery Why: For wound re-check Contact information: [address] [place] [address] [phone]                 Boas Eletha, MD [facility] Office: [phone]   Signed: Boas Eletha 06/26/2023, 10:59 AM

## 2023-06-26 NOTE — Discharge Instructions (Signed)

## 2023-06-29 DIAGNOSIS — E210 Primary hyperparathyroidism: Secondary | ICD-10-CM | POA: Diagnosis not present

## 2023-07-08 ENCOUNTER — Encounter: Payer: Self-pay | Admitting: Student

## 2023-07-08 ENCOUNTER — Ambulatory Visit: Payer: Medicare Other | Admitting: Student

## 2023-07-08 VITALS — BP 136/84 | HR 81 | Temp 97.7°F | Ht 65.0 in | Wt 159.0 lb

## 2023-07-08 DIAGNOSIS — K580 Irritable bowel syndrome with diarrhea: Secondary | ICD-10-CM

## 2023-07-08 DIAGNOSIS — G4700 Insomnia, unspecified: Secondary | ICD-10-CM

## 2023-07-08 DIAGNOSIS — I10 Essential (primary) hypertension: Secondary | ICD-10-CM | POA: Diagnosis not present

## 2023-07-08 DIAGNOSIS — Z8673 Personal history of transient ischemic attack (TIA), and cerebral infarction without residual deficits: Secondary | ICD-10-CM

## 2023-07-08 DIAGNOSIS — Z96651 Presence of right artificial knee joint: Secondary | ICD-10-CM | POA: Diagnosis not present

## 2023-07-08 DIAGNOSIS — H903 Sensorineural hearing loss, bilateral: Secondary | ICD-10-CM

## 2023-07-08 DIAGNOSIS — E213 Hyperparathyroidism, unspecified: Secondary | ICD-10-CM | POA: Diagnosis not present

## 2023-07-08 DIAGNOSIS — E785 Hyperlipidemia, unspecified: Secondary | ICD-10-CM

## 2023-07-08 DIAGNOSIS — F3341 Major depressive disorder, recurrent, in partial remission: Secondary | ICD-10-CM

## 2023-07-08 DIAGNOSIS — R739 Hyperglycemia, unspecified: Secondary | ICD-10-CM

## 2023-07-08 DIAGNOSIS — E039 Hypothyroidism, unspecified: Secondary | ICD-10-CM

## 2023-07-08 DIAGNOSIS — K219 Gastro-esophageal reflux disease without esophagitis: Secondary | ICD-10-CM | POA: Diagnosis not present

## 2023-07-08 DIAGNOSIS — J4521 Mild intermittent asthma with (acute) exacerbation: Secondary | ICD-10-CM

## 2023-07-08 DIAGNOSIS — I5032 Chronic diastolic (congestive) heart failure: Secondary | ICD-10-CM | POA: Diagnosis not present

## 2023-07-08 MED ORDER — CLONAZEPAM 1 MG PO TABS: 0.5000 mg | ORAL_TABLET | Freq: Every day | ORAL | Status: DC

## 2023-07-08 NOTE — Progress Notes (Signed)
Location:  [facility] clinic [facility].   Provider: Dr. Earnestine Mealing  Code Status: DNR Goals of Care:     07/08/2023    1:05 PM  Advanced Directives  Does Patient Have a Medical Advance Directive? Yes  Type of Estate agent of [place];Out of facility DNR (pink MOST or yellow form);Living will  Does patient want to make changes to medical advance directive? No - Patient declined  Copy of Healthcare Power of Attorney in Chart? No - copy requested     Chief Complaint  Patient presents with   Establish Care    To Establish Care.     HPI: Patient is a 88 y.o. female seen today to Establish.  Discussed the use of AI scribe software for clinical note transcription with the patient, who gave verbal consent to proceed.  History of Present Illness   The patient, a former Radio broadcast assistant, presents with a history of multiple medical conditions, including hypothyroidism, hyperparathyroidism, hypertension, high cholesterol, and depression. She underwent a successful parathyroidectomy on June 25, 2023 and has been recovering well post-operatively. However, she reports a sensation of "going up and over something" when swallowing, which may be due to residual inflammation and swelling.  The patient has a long-standing history of depression, dating back to her teenage years. She has been on a stable dose of venlafaxine and mirtazapine for approximately ten years. She reports a period of increased depression in November and December, but currently denies feelings of hopelessness or sadness. Her husband died ~2 years ago, and her dog has helped coping with the loss.   She also reports a history of transient ischemic attack (TIA) in 2022, for which she was prescribed daily aspirin. However, she has since stopped taking the aspirin and instead takes Excedrin daily for frequent headaches. She also has a history of congestive heart failure, diagnosed following an  echocardiogram in August 2022, which showed grade three diastolic dysfunction.  The patient has a history of irritable bowel syndrome (IBS) and acid reflux, which worsened significantly in November and December, causing agitation, anxiety, and nervousness. She found relief by elevating her upper body during sleep. She is currently on omeprazole for acid reflux.  She also reports a history of rheumatoid arthritis-like symptoms, which were managed with tizanidine. However, she no longer takes this medication as a new diagnosis of a different type of arthritis was made, for which she was prescribed hydroxychloroquine.  The patient has a history of knee replacement surgery two years ago, following which she experienced balance problems and underwent physical therapy. She reports occasional unsteadiness on her feet and has to be careful not to get up too fast.    Health goal: She expresses a desire to start exercising more and plans to join a flexibility and balance program.  She also reports a history of hearing loss and wears hearing aids, which she forgot to bring to the appointment. She requested an ear examination due to concerns about wax build-up. She also reports a dry mouth, which she attributes to her medication, Effexor.  [name] medication regimen is extensive and includes venlafaxine, valsartan, omeprazole, atorvastatin, albuterol, amoxicillin (as needed for tooth extractions or dental surgery), and a multivitamin. She also takes iron, folic acid, and vitamin D supplements. She recently reduced her dose of clonazepam to 0.5 mg. She also takes hydroxychloroquine for CPPD arthritis and Excedrin for headaches. She has been advised to consider reducing her dose of venlafaxine and to monitor  her weight due to potential weight gain associated with mirtazapine.     Medical conditions Medications [employer] Mob[employer] Matters most    Past Medical History:  Diagnosis Date   Anxiety    CHF  (congestive heart failure) (HCC) 2022   diastolic   COVID-19 05/30/2021   Diverticulosis    DVT (deep venous thrombosis) (HCC) 06/16/2010   after TKR   Dyspnea    Hyperlipidemia    Hyperparathyroidism (HCC)    Hypertension    Hypothyroidism    IBS (irritable bowel syndrome)    Inflammatory arthritis    Osteoarthritis, knee    Recurrent major depression (HCC)    TIA (transient ischemic attack) 01/2021    Past Surgical History:  Procedure Laterality Date   ABDOMINAL HYSTERECTOMY  06/16/1989   CATARACT EXTRACTION W/ INTRAOCULAR LENS  IMPLANT, BILATERAL  06/16/2012   CHOLECYSTECTOMY  06/16/1996   COLONOSCOPY     DEXA  08/15/2007   Normal   HAMMER TOE SURGERY Left 10/15/2014   Dr Elvin So   LUMBAR FUSION  06/17/1991   MENISCUS REPAIR Left 04/16/2010   PARATHYROIDECTOMY Left 06/25/2023   Procedure: NECK EXPLORATION WITH LEFT SUPERIOR PARATHYROIDECTOMY;  Surgeon: Darnell Level, MD;  Location: [facility];  Service: General;  Laterality: Left;   PARATHYROIDECTOMY  06/25/2023   TOTAL KNEE ARTHROPLASTY Left 06/16/2010   TOTAL KNEE ARTHROPLASTY Right 07/09/2021   Procedure: TOTAL KNEE ARTHROPLASTY;  Surgeon: Christena Flake, MD;  Location: [facility] ORS;  Service: Orthopedics;  Laterality: Right;   WRIST SURGERY Bilateral     No Known Allergies  Outpatient Encounter Medications as of 07/08/2023  Medication Sig   albuterol (VENTOLIN HFA) 108 (90 Base) MCG/ACT inhaler Inhale 2 puffs into the lungs every 6 (six) hours as needed for wheezing or shortness of breath.   amoxicillin (AMOXIL) 500 MG tablet Take 4 tablets (2,000 mg total) by mouth daily. 1 hour before dental appointment   aspirin-acetaminophen-caffeine (EXCEDRIN MIGRAINE) 250-250-65 MG tablet Take 1 tablet by mouth every 6 (six) hours as needed for headache.   atorvastatin (LIPITOR) 40 MG tablet TAKE 1 TABLET BY MOUTH EVERY EVENING   cholecalciferol (VITAMIN D3) 25 MCG (1000 UNIT) tablet Take 1,000 Units by mouth daily.   ferrous sulfate  325 (65 FE) MG tablet Take 325 mg by mouth daily with breakfast.   folic acid (FOLVITE) 400 MCG tablet Take 400 mcg by mouth daily.   hydroxychloroquine (PLAQUENIL) 200 MG tablet Take 200 mg by mouth 2 (two) times daily.   levothyroxine (SYNTHROID) 75 MCG tablet TAKE 1 TABLET BY MOUTH EVERY DAY   mirtazapine (REMERON) 15 MG tablet TAKE 1 TABLET BY MOUTH EVERY DAY AT BEDTIME   Multiple Vitamins-Minerals (IMMUNE SUPPORT PO) Take 1 tablet by mouth daily. With Zinc   Multiple Vitamins-Minerals (MULTIVITAMIN WITH MINERALS) tablet Take 2 tablets by mouth daily. Gummy   omeprazole (PRILOSEC) 20 MG capsule Take 1 capsule (20 mg total) by mouth every evening.   Probiotic Product (ALIGN) 4 MG CAPS Take 4 mg by mouth at bedtime.   valsartan (DIOVAN) 160 MG tablet Take 1 tablet (160 mg total) by mouth daily.   venlafaxine XR (EFFEXOR-XR) 150 MG 24 hr capsule TAKE 1 CAPSULE BY MOUTH EVERY DAY WITH 75 MG CAPSULE   venlafaxine XR (EFFEXOR-XR) 75 MG 24 hr capsule TAKE 1 CAPSULE BY MOUTH EVERY DAY ALONG WITH 150 MG CAPSULE   [DISCONTINUED] clonazePAM (KLONOPIN) 1 MG tablet TAKE 1/2 TO 1 TABLET(0.5 TO 1 MG) BY MOUTH AT BEDTIME AS NEEDED (  Patient taking differently: Take 0.5 mg by mouth at bedtime.)   [DISCONTINUED] tiZANidine (ZANAFLEX) 4 MG tablet Take 1 tablet (4 mg total) by mouth every 8 (eight) hours as needed for muscle spasms.   clonazePAM (KLONOPIN) 1 MG tablet Take 0.5 tablets (0.5 mg total) by mouth at bedtime.   [DISCONTINUED] traMADol (ULTRAM) 50 MG tablet Take 1 tablet (50 mg total) by mouth every 6 (six) hours as needed for moderate pain (pain score 4-6).   No facility-administered encounter medications on file as of 07/08/2023.    Review of Systems:  Review of Systems  Health Maintenance  Topic Date Due   COVID-19 Vaccine (8 - 2024-25 season) 05/08/2023   Medicare Annual Wellness (AWV)  11/06/2023   DTaP/Tdap/Td (3 - Td or Tdap) 05/02/2031   Pneumonia Vaccine 49+ Years old  Completed    INFLUENZA VACCINE  Completed   DEXA SCAN  Completed   Zoster Vaccines- Shingrix  Completed   HPV VACCINES  Aged Out    Physical Exam: Vitals:   07/08/23 1300  BP: 136/84  Pulse: 81  Temp: 97.7 F (36.5 C)  SpO2: 99%  Weight: 159 lb (72.1 kg)  Height: 5\' 5"  (1.651 m)   Body mass index is 26.46 kg/m. Physical Exam Physical Exam   VITALS: BP- 136/84 HEENT: Bilateral ear wax present. Eyes equal, reactive. Throat not swollen, inflamed, or red. Soft tissue swelling at surgical site with skin reaction but no itching. CHEST: Lungs clear to auscultation. CARDIOVASCULAR: Heart sounds normal rate, no murmurs EXTREMITIES: No edema, pulses good in both feet. Band-Aids on toes due to sharp, onychomycosis of the great toe bilaterally. SKIN: Rash around surgical site, no itching. Scar from knee replacement.       Labs reviewed: Basic Metabolic Panel: Recent Labs    07/10/22 1533 11/06/22 1549 03/10/23 1318 06/16/23 1327 06/26/23 0449  NA 137 140 141 137  --   K 4.2 5.0 4.7 4.4  --   CL 102 103 104 105  --   CO2 26 29 29 27   --   GLUCOSE 103* 94 110* 105*  --   BUN 13 18 18 16   --   CREATININE 0.87 0.90 0.91 0.75  --   CALCIUM 10.7* 10.7* 10.7* 10.2 8.7*  PHOS 2.9 3.3 3.4  --   --   TSH  --  3.46 2.13  --   --    Liver Function Tests: Recent Labs    07/10/22 1533 11/06/22 1549 03/10/23 1318  AST  --  19  --   ALT  --  21  --   ALKPHOS  --  132*  --   BILITOT  --  0.5  --   PROT  --  6.8  --   ALBUMIN 4.4 4.2  4.2 4.1   No results for input(s): "LIPASE", "AMYLASE" in the last 8760 hours. No results for input(s): "AMMONIA" in the last 8760 hours. CBC: Recent Labs    11/06/22 1549 06/16/23 1327  WBC 7.6 7.1  HGB 13.3 12.6  HCT 40.7 39.4  MCV 84.6 87.9  PLT 454.0* 381   Lipid Panel: Recent Labs    11/06/22 1549  CHOL 148  HDL 36.90*  LDLCALC 71  TRIG 197.0*  CHOLHDL 4   Lab Results  Component Value Date   HGBA1C 6.1 (H) 01/23/2021  Results    LABS T4: 0.83 (02/2023) TSH: 2.13 (02/2023)  RADIOLOGY Carotid ultrasound: Minimal heterogenous plaque with no hemodynamically significant stenosis (01/23/2021)  DIAGNOSTIC Echocardiogram: Normal ejection fraction, grade 3 diastolic dysfunction (01/24/2021) EKG: Heart rate 84 bpm, QT interval 380 ms, PR interval 136 ms, sinus rhythm (04/2023)       Assessment/Plan Assessment and Plan    Post-Parathyroidectomy Status Status post parathyroidectomy on June 25, 2023. Reports residual swelling and inflammation, but no acute concerns. No signs of infection or significant complications noted. Discussed monitoring for signs of infection and complications. - Monitor for signs of infection or complications - Follow-up with surgeon as scheduled  Congestive Heart Failure with Diastolic Dysfunction Grade 3 diastolic dysfunction noted on echocardiogram in August 2022. Normal ejection fraction. No current symptoms of heart failure. Discussed potential need for repeat echocardiogram in 2025. - Consider repeat echocardiogram in 2025 - Monitor for heart failure symptoms  Hypertension Well-controlled with valsartan 160 mg daily. Recent blood pressure reading was 136/84 mmHg. Discussed importance of regular monitoring. - Continue valsartan 160 mg daily - Monitor blood pressure regularly  Hyperlipidemia Managed with atorvastatin. Discussed importance of regular lipid level monitoring. - Continue atorvastatin - Monitor lipid levels regularly  Depression Recent exacerbation in November and December, likely related to spouse's death anniversary. Currently well-managed with venlafaxine and mirtazapine. No current feelings of depression or hopelessness. Discussed potential future dose reduction of venlafaxine if stable. - Continue venlafaxine 75 mg and 150 mg daily - Continue mirtazapine - Consider future dose reduction of venlafaxine if stable - Monitor mood and follow-up as  needed  Insomnia Chronic insomnia managed with clonazepam 0.5 mg. Reports difficulty falling asleep. Discussed trying melatonin 3-5 mg at dinnertime and considering increasing mirtazapine dose if melatonin is ineffective. - Try melatonin 3-5 mg at dinnertime - Consider increasing mirtazapine dose if melatonin is ineffective - Monitor sleep patterns and adjust treatment as needed  Gastroesophageal Reflux Disease (GERD) Managed with omeprazole 20 mg daily. Symptoms improved with lifestyle modifications. Discussed potential dose adjustment if symptoms worsen. - Continue omeprazole 20 mg daily - Monitor symptoms and consider dose adjustment if symptoms worsen  Irritable Bowel Syndrome (IBS) Recent exacerbation in November and December. Symptoms improved with dietary modifications. Discussed importance of continuing dietary modifications. - Continue dietary modifications - Monitor symptoms and adjust treatment as needed  Hypothyroidism Well-controlled with levothyroxine 75 mcg daily. Recent TSH and T4 levels within normal range. Discussed importance of regular thyroid function monitoring. - Continue levothyroxine 75 mcg daily - Monitor thyroid function tests regularly  CPPD (Calcium Pyrophosphate Deposition Disease) Managed with hydroxychloroquine. No recent joint swelling or pain. Discussed risks and benefits of continuing hydroxychloroquine and importance of coordinating with rheumatologist. - Continue hydroxychloroquine - Renew pres cription - Coordinate with rheumatologist for ongoing management  General Health Maintenance Expressed desire to start exercising to improve mobility and overall health. No recent falls, but occasional unsteadiness noted. Discussed importance of regular walking and participation in flexibility and balance program. - Encourage regular walking and participation in flexibility and balance program - Follow-up in three months to assess progress - Monitor for  new falls or balance issues  Follow-up - Order blood work for July 13, 2023 - Send lab results to Dr. Gerrit Friends for post-op follow-up - Schedule follow-up appointment in three months.      Labs/tests ordered:   Next appt: Discuss dose reduction of clonazapam, effexor, increase mirtazapine?

## 2023-07-08 NOTE — Patient Instructions (Signed)
VISIT SUMMARY:  During today's visit, we reviewed your multiple medical conditions, including your recovery from parathyroid surgery, heart health, blood pressure, cholesterol, depression, sleep issues, acid reflux, irritable bowel syndrome, thyroid function, and arthritis. We also discussed your medication regimen and plans for improving your overall health through exercise.  YOUR PLAN:  -POST-PARATHYROIDECTOMY STATUS: You are recovering well from your parathyroid surgery in January. There is some residual swelling and inflammation, but no signs of infection or complications. Please monitor for any signs of infection and follow up with your surgeon as scheduled.  -CONGESTIVE HEART FAILURE WITH DIASTOLIC DYSFUNCTION: You have a condition where the heart has difficulty relaxing and filling with blood. Your heart function is stable, but we may need to repeat an echocardiogram in 2025. Please monitor for any symptoms of heart failure.  -HYPERTENSION: Your blood pressure is well-controlled with your current medication, valsartan. Please continue taking it daily and monitor your blood pressure regularly.  -HYPERLIPIDEMIA: You have high cholesterol, which is managed with atorvastatin. Please continue taking it and monitor your lipid levels regularly.  -DEPRESSION: Your depression is currently well-managed with venlafaxine and mirtazapine. We discussed the possibility of reducing your venlafaxine dose in the future if your condition remains stable. Please continue your medications and monitor your mood.  -INSOMNIA: You have difficulty falling asleep. We discussed trying melatonin at dinnertime and possibly increasing your mirtazapine dose if melatonin does not help. Please monitor your sleep patterns and adjust treatment as needed.  -GASTROESOPHAGEAL REFLUX DISEASE (GERD): You have acid reflux, which is managed with omeprazole. Your symptoms have improved with lifestyle changes. Please continue your  medication and monitor your symptoms.  -IRRITABLE BOWEL SYNDROME (IBS): You have IBS, which recently worsened but has improved with dietary changes. Please continue these dietary modifications and monitor your symptoms.  -HYPOTHYROIDISM: Your thyroid function is well-controlled with levothyroxine. Please continue taking it daily and monitor your thyroid function tests regularly.  -CPPD (CALCIUM PYROPHOSPHATE DEPOSITION DISEASE): You have a type of arthritis managed with hydroxychloroquine. There are no recent joint issues. Please continue your medication and coordinate with your rheumatologist for ongoing management.  -GENERAL HEALTH MAINTENANCE: You expressed a desire to start exercising to improve your mobility and overall health. Please engage in regular walking and participate in a flexibility and balance program. We will follow up in three months to assess your progress.  INSTRUCTIONS:  Please order blood work for July 13, 2023, and send the lab results to Dr. Georgana Curio for your post-op follow-up. Schedule a follow-up appointment in three months.

## 2023-07-13 DIAGNOSIS — E039 Hypothyroidism, unspecified: Secondary | ICD-10-CM | POA: Diagnosis not present

## 2023-07-13 DIAGNOSIS — E213 Hyperparathyroidism, unspecified: Secondary | ICD-10-CM | POA: Diagnosis not present

## 2023-07-13 DIAGNOSIS — Z8673 Personal history of transient ischemic attack (TIA), and cerebral infarction without residual deficits: Secondary | ICD-10-CM | POA: Diagnosis not present

## 2023-07-14 LAB — PTH, INTACT AND CALCIUM
Calcium: 8.9 mg/dL (ref 8.6–10.4)
PTH: 116 pg/mL — ABNORMAL HIGH (ref 16–77)

## 2023-07-14 LAB — CBC WITH DIFFERENTIAL/PLATELET
Absolute Lymphocytes: 2284 {cells}/uL (ref 850–3900)
Absolute Monocytes: 701 {cells}/uL (ref 200–950)
Basophils Absolute: 64 {cells}/uL (ref 0–200)
Basophils Relative: 0.7 %
Eosinophils Absolute: 264 {cells}/uL (ref 15–500)
Eosinophils Relative: 2.9 %
HCT: 39 % (ref 35.0–45.0)
Hemoglobin: 12.4 g/dL (ref 11.7–15.5)
MCH: 27.1 pg (ref 27.0–33.0)
MCHC: 31.8 g/dL — ABNORMAL LOW (ref 32.0–36.0)
MCV: 85.3 fL (ref 80.0–100.0)
MPV: 9.4 fL (ref 7.5–12.5)
Monocytes Relative: 7.7 %
Neutro Abs: 5788 {cells}/uL (ref 1500–7800)
Neutrophils Relative %: 63.6 %
Platelets: 599 10*3/uL — ABNORMAL HIGH (ref 140–400)
RBC: 4.57 10*6/uL (ref 3.80–5.10)
RDW: 13.8 % (ref 11.0–15.0)
Total Lymphocyte: 25.1 %
WBC: 9.1 10*3/uL (ref 3.8–10.8)

## 2023-07-14 LAB — COMPLETE METABOLIC PANEL WITH GFR
AG Ratio: 1.7 (calc) (ref 1.0–2.5)
ALT: 19 U/L (ref 6–29)
AST: 18 U/L (ref 10–35)
Albumin: 4 g/dL (ref 3.6–5.1)
Alkaline phosphatase (APISO): 111 U/L (ref 37–153)
BUN: 14 mg/dL (ref 7–25)
CO2: 27 mmol/L (ref 20–32)
Calcium: 8.9 mg/dL (ref 8.6–10.4)
Chloride: 105 mmol/L (ref 98–110)
Creat: 0.69 mg/dL (ref 0.60–0.95)
Globulin: 2.4 g/dL (ref 1.9–3.7)
Glucose, Bld: 92 mg/dL (ref 65–99)
Potassium: 3.9 mmol/L (ref 3.5–5.3)
Sodium: 140 mmol/L (ref 135–146)
Total Bilirubin: 0.4 mg/dL (ref 0.2–1.2)
Total Protein: 6.4 g/dL (ref 6.1–8.1)
eGFR: 85 mL/min/{1.73_m2} (ref >=60)

## 2023-07-14 LAB — IRON,TIBC AND FERRITIN PANEL
%SAT: 17 % (ref 16–45)
Ferritin: 147 ng/mL (ref 16–288)
Iron: 41 ug/dL — ABNORMAL LOW (ref 45–160)
TIBC: 240 ug/dL — ABNORMAL LOW (ref 250–450)

## 2023-07-14 LAB — VITAMIN B12: Vitamin B-12: >2000 pg/mL — ABNORMAL HIGH (ref 200–1100)

## 2023-07-14 LAB — TSH: TSH: 7.11 m[IU]/L — ABNORMAL HIGH (ref 0.40–4.50)

## 2023-07-15 DIAGNOSIS — Z9889 Other specified postprocedural states: Secondary | ICD-10-CM | POA: Insufficient documentation

## 2023-07-17 ENCOUNTER — Encounter: Payer: Self-pay | Admitting: Student

## 2023-07-17 MED ORDER — LEVOTHYROXINE SODIUM 100 MCG PO TABS: 100.0000 ug | ORAL_TABLET | Freq: Every day | ORAL | 3 refills | Status: DC

## 2023-07-17 NOTE — Addendum Note (Signed)
Addended by: Earnestine Mealing on: 07/17/2023 08:32 AM   Modules accepted: Orders

## 2023-07-22 ENCOUNTER — Telehealth: Payer: Self-pay | Admitting: *Deleted

## 2023-07-22 ENCOUNTER — Other Ambulatory Visit: Payer: Self-pay | Admitting: Internal Medicine

## 2023-07-22 DIAGNOSIS — E039 Hypothyroidism, unspecified: Secondary | ICD-10-CM

## 2023-07-22 DIAGNOSIS — G4700 Insomnia, unspecified: Secondary | ICD-10-CM

## 2023-07-22 MED ORDER — CLONAZEPAM 1 MG PO TABS: 0.5000 mg | ORAL_TABLET | Freq: Every day | ORAL | 5 refills | Status: DC

## 2023-07-22 NOTE — Telephone Encounter (Signed)
 Patient notified of Lab Results and placed an order for patient to get repeat TSH on 09/07/2023.   Patient stated that she needs a refill on her Clonazepam . Stated that she did try coming off of it but found that she needs it and request refill.   Pended Rx and sent to Dr. Abdul for approval.

## 2023-08-29 ENCOUNTER — Other Ambulatory Visit: Payer: Self-pay | Admitting: Internal Medicine

## 2023-08-31 ENCOUNTER — Other Ambulatory Visit: Payer: Self-pay | Admitting: Student

## 2023-08-31 DIAGNOSIS — E039 Hypothyroidism, unspecified: Secondary | ICD-10-CM

## 2023-08-31 NOTE — Telephone Encounter (Signed)
 Copied from CRM [id number]. Topic: Clinical - Medication Refill >> Aug 31, 2023  1:42 PM Prudencio Pair wrote: Most Recent Primary Care Visit:  Provider: Earnestine Mealing  Department: [facility]  Visit Type: NEW PATIENT  Date: 07/08/2023  Medication: venlafaxine XR (EFFEXOR-XR) 150 MG 24 hr capsule, venlafaxine XR (EFFEXOR-XR) 75 MG 24 hr capsule, levothyroxine (SYNTHROID) 100 MCG tablet, mirtazapine (REMERON) 15 MG tablet, atorvastatin (LIPITOR) 40 MG tablet   Has the patient contacted their pharmacy? Yes, pharmacy states it was denied due to provider.  (Agent: If no, request that the patient contact the pharmacy for the refill. If patient does not wish to contact the pharmacy document the reason why and proceed with request.) (Agent: If yes, when and what did the pharmacy advise?)  Is this the correct pharmacy for this prescription? Yes If no, delete pharmacy and type the correct one.  This is the patient's preferred pharmacy:  [id number] - [place], [place] - [address] AT [facility] OF [address] [place] [address] Phone: [phone] Fax: [phone]   Has the prescription been filled recently? Yes  Is the patient out of the medication? Yes  Has the patient been seen for an appointment in the last year OR does the patient have an upcoming appointment? Yes  Can we respond through MyChart? Yes  Agent: Please be advised that Rx refills may take up to 3 business days. We ask that you follow-up with your pharmacy.

## 2023-09-01 MED ORDER — LEVOTHYROXINE SODIUM 100 MCG PO TABS
100.0000 ug | ORAL_TABLET | Freq: Every day | ORAL | 1 refills | Status: DC
Start: 2023-09-01 — End: 2024-01-19

## 2023-09-01 MED ORDER — MIRTAZAPINE 15 MG PO TABS: 15.0000 mg | ORAL_TABLET | Freq: Every day | ORAL | 1 refills | Status: DC

## 2023-09-01 MED ORDER — VENLAFAXINE HCL ER 75 MG PO CP24: ORAL_CAPSULE | ORAL | 1 refills | Status: DC

## 2023-09-01 MED ORDER — VENLAFAXINE HCL ER 150 MG PO CP24: ORAL_CAPSULE | ORAL | 1 refills | Status: DC

## 2023-09-01 MED ORDER — ATORVASTATIN CALCIUM 40 MG PO TABS: 40.0000 mg | ORAL_TABLET | Freq: Every evening | ORAL | 1 refills | Status: DC

## 2023-09-01 NOTE — Telephone Encounter (Signed)
 Patient requested refill.  Pended Rx's and sent to Dr. Sydnee Cabal for approval due to HIGH ALERT Warning.

## 2023-09-07 DIAGNOSIS — E039 Hypothyroidism, unspecified: Secondary | ICD-10-CM | POA: Diagnosis not present

## 2023-09-08 LAB — TSH: TSH: 3.42 m[IU]/L (ref 0.40–4.50)

## 2023-09-10 ENCOUNTER — Encounter: Payer: Self-pay | Admitting: Student

## 2023-10-07 ENCOUNTER — Encounter: Payer: Self-pay | Admitting: Student

## 2023-10-07 ENCOUNTER — Ambulatory Visit: Payer: Medicare Other | Admitting: Student

## 2023-10-07 VITALS — BP 138/82 | HR 85 | Temp 97.7°F | Ht 65.0 in | Wt 161.0 lb

## 2023-10-07 DIAGNOSIS — E039 Hypothyroidism, unspecified: Secondary | ICD-10-CM | POA: Diagnosis not present

## 2023-10-07 DIAGNOSIS — Z79899 Other long term (current) drug therapy: Secondary | ICD-10-CM

## 2023-10-07 DIAGNOSIS — E538 Deficiency of other specified B group vitamins: Secondary | ICD-10-CM | POA: Diagnosis not present

## 2023-10-07 DIAGNOSIS — F3341 Major depressive disorder, recurrent, in partial remission: Secondary | ICD-10-CM

## 2023-10-07 DIAGNOSIS — R296 Repeated falls: Secondary | ICD-10-CM

## 2023-10-07 MED ORDER — VENLAFAXINE HCL ER 37.5 MG PO CP24
ORAL_CAPSULE | ORAL | 0 refills | Status: DC
Start: 2023-10-07 — End: 2023-10-30

## 2023-10-07 NOTE — Progress Notes (Signed)
 [facility] Provider: Jann Melody  Code Status: Full Code Goals of Care:     07/08/2023    1:05 PM  Advanced Directives  Does Patient Have a Medical Advance Directive? Yes  Type of Estate agent of [place];Out of facility DNR (pink MOST or yellow form);Living will  Does patient want to make changes to medical advance directive? No - Patient declined  Copy of Healthcare Power of Attorney in Chart? No - copy requested     Chief Complaint  Patient presents with   Medical Management of Chronic Issues    Medical Management of Chronic Issue. 3 Month follow up with Labs.     HPI: Patient is a 88 y.o. female seen today for medical management of chronic diseases.   Discussed the use of AI scribe software for clinical note transcription with the patient, who gave verbal consent to proceed.  History of Present Illness   Wendy Murray "Wendy Murray" is an 88 year old female who presents for a routine visit with recent falls and for medication review.  She has experienced two recent falls. The first occurred on an [employer] train when it jerked suddenly, causing her to lose balance and fall backwards. She landed on her back without sustaining any injuries. The second fall happened while ascending the steps to a chapel during a [employer]. She was holding onto an elderly gentleman's arm, and both fell but were unharmed. She attributes these falls to her rubber-soled shoes. She feels unsteady on her feet at times.  She has not participated in physical therapy for gait training and has not used a cane but feels unsteady. She is considering starting to use a cane to improve her stability.  She is currently taking several medications, including levothyroxine  100 mcg daily for thyroid  management, venlafaxine  150 mg plus 75 mg daily, mirtazapine , and clonazepam  0.5 mg. She has recently stopped taking vitamin B12 due to previously high levels. She  continues to take vitamin D . Clonazepam  affects her sleep, as she experienced difficulty sleeping when she ran out of it while visiting her nephew.  Her thyroid  levels, previously elevated in January, have normalized. She is due for a COVID vaccine, which she plans to get at [facility], having missed it due to travel. She is also due for an annual wellness visit soon.     Past Medical History:  Diagnosis Date   Anxiety    CHF (congestive heart failure) (HCC) 2022   diastolic   COVID-19 05/30/2021   Diverticulosis    DVT (deep venous thrombosis) (HCC) 06/16/2010   after TKR   Dyspnea    Hyperlipidemia    Hyperparathyroidism (HCC)    Hypertension    Hypothyroidism    IBS (irritable bowel syndrome)    Inflammatory arthritis    Osteoarthritis, knee    Recurrent major depression (HCC)    TIA (transient ischemic attack) 01/2021    Past Surgical History:  Procedure Laterality Date   ABDOMINAL HYSTERECTOMY  06/16/1989   CATARACT EXTRACTION W/ INTRAOCULAR LENS  IMPLANT, BILATERAL  06/16/2012   CHOLECYSTECTOMY  06/16/1996   COLONOSCOPY     DEXA  08/15/2007   Normal   HAMMER TOE SURGERY Left 10/15/2014   Dr Meriam Stamp   LUMBAR FUSION  06/17/1991   MENISCUS REPAIR Left 04/16/2010   PARATHYROIDECTOMY Left 06/25/2023   Procedure: NECK EXPLORATION WITH LEFT SUPERIOR PARATHYROIDECTOMY;  Surgeon: Oralee Billow, MD;  Location: [facility];  Service: General;  Laterality: Left;   PARATHYROIDECTOMY  06/25/2023   TOTAL KNEE ARTHROPLASTY Left 06/16/2010   TOTAL KNEE ARTHROPLASTY Right 07/09/2021   Procedure: TOTAL KNEE ARTHROPLASTY;  Surgeon: Elner Hahn, MD;  Location: [facility] OR[facility];  Service: Orthopedics;  Laterality: Right;   WRIST SURGERY Bilateral     No Known Allergies  Outpatient Encounter Medications as of 10/07/2023  Medication Sig   albuterol  (VENTOLIN  HFA) 108 (90 Base) MCG/ACT inhaler Inhale 2 puffs into the lungs every 6 (six) hours as needed for wheezing or shortness of breath.    amoxicillin  (AMOXIL ) 500 MG tablet Take 4 tablets (2,000 mg total) by mouth daily. 1 hour before dental appointment   aspirin -acetaminophen -caffeine (EXCEDRIN MIGRAINE) 250-250-65 MG tablet Take 1 tablet by mouth every 6 (six) hours as needed for headache.   atorvastatin  (LIPITOR) 40 MG tablet Take 1 tablet (40 mg total) by mouth every evening.   cholecalciferol  (VITAMIN D3) 25 MCG (1000 UNIT) tablet Take 1,000 Units by mouth daily.   clonazePAM  (KLONOPIN ) 1 MG tablet Take 0.5 tablets (0.5 mg total) by mouth at bedtime.   ferrous sulfate  325 (65 FE) MG tablet Take 325 mg by mouth daily with breakfast.   folic acid (FOLVITE) 400 MCG tablet Take 400 mcg by mouth daily.   hydroxychloroquine  (PLAQUENIL ) 200 MG tablet Take 200 mg by mouth 2 (two) times daily.   levothyroxine  (SYNTHROID ) 100 MCG tablet Take 1 tablet (100 mcg total) by mouth daily.   mirtazapine  (REMERON ) 15 MG tablet Take 1 tablet (15 mg total) by mouth at bedtime.   Multiple Vitamins-Minerals (IMMUNE SUPPORT PO) Take 1 tablet by mouth daily. With Zinc   Multiple Vitamins-Minerals (MULTIVITAMIN WITH MINERALS) tablet Take 2 tablets by mouth daily. Gummy   omeprazole  (PRILOSEC) 20 MG capsule Take 1 capsule (20 mg total) by mouth every evening.   Probiotic Product (ALIGN) 4 MG CAPS Take 4 mg by mouth at bedtime.   valsartan  (DIOVAN ) 160 MG tablet Take 1 tablet (160 mg total) by mouth daily.   venlafaxine  XR (EFFEXOR -XR) 150 MG 24 hr capsule TAKE 1 CAPSULE BY MOUTH EVERY DAY WITH 75 MG CAPSULE   [DISCONTINUED] venlafaxine  XR (EFFEXOR -XR) 75 MG 24 hr capsule TAKE 1 CAPSULE BY MOUTH EVERY DAY ALONG WITH 150 MG CAPSULE   venlafaxine  XR (EFFEXOR -XR) 37.5 MG 24 hr capsule TAKE 1 CAPSULE BY MOUTH EVERY DAY ALONG WITH 150 MG CAPSULE   No facility-administered encounter medications on file as of 10/07/2023.    Review of Systems:  Review of Systems  Health Maintenance  Topic Date Due   COVID-19 Vaccine (8 - 2024-25 season) 05/08/2023    Medicare Annual Wellness (AWV)  11/06/2023   INFLUENZA VACCINE  01/15/2024   DTaP/Tdap/Td (3 - Td or Tdap) 05/02/2031   Pneumonia Vaccine 9+ Years old  Completed   DEXA SCAN  Completed   Zoster Vaccines- Shingrix  Completed   HPV VACCINES  Aged Out   Meningococcal B Vaccine  Aged Out    Physical Exam: Vitals:   10/07/23 1422  BP: 138/82  Pulse: 85  Temp: 97.7 F (36.5 C)  SpO2: 96%  Weight: 161 lb (73 kg)  Height: 5\' 5"  (1.651 m)   Body mass index is 26.79 kg/m. Physical Exam Constitutional:      Appearance: Normal appearance.  Cardiovascular:     Rate and Rhythm: Normal rate and regular rhythm.     Pulses: Normal pulses.     Heart sounds: Normal heart sounds.  Pulmonary:  Effort: Pulmonary effort is normal.  Abdominal:     General: Abdomen is flat. Bowel sounds are normal.     Palpations: Abdomen is soft.  Musculoskeletal:        General: No swelling or tenderness.  Skin:    General: Skin is warm and dry.  Neurological:     Mental Status: She is alert and oriented to person, place, and time.     Gait: Gait normal.  Psychiatric:        Mood and Affect: Mood normal.      Labs reviewed: Basic Metabolic Panel: Recent Labs    11/06/22 1549 03/10/23 1318 06/16/23 1327 06/26/23 0449 07/13/23 0804 09/07/23 0740  NA 140 141 137  --  140  --   K 5.0 4.7 4.4  --  3.9  --   CL 103 104 105  --  105  --   CO2 29 29 27   --  27  --   GLUCOSE 94 110* 105*  --  92  --   BUN 18 18 16   --  14  --   CREATININE 0.90 0.91 0.75  --  0.69  --   CALCIUM  10.7* 10.7* 10.2 8.7* 8.9  8.9  --   PHOS 3.3 3.4  --   --   --   --   TSH 3.46 2.13  --   --  7.11* 3.42   Liver Function Tests: Recent Labs    11/06/22 1549 03/10/23 1318 07/13/23 0804  AST 19  --  18  ALT 21  --  19  ALKPHOS 132*  --   --   BILITOT 0.5  --  0.4  PROT 6.8  --  6.4  ALBUMIN 4.2  4.2 4.1  --    No results for input(s): "LIPASE", "AMYLASE" in the last 8760 hours. No results for input(s):  "AMMONIA" in the last 8760 hours. CBC: Recent Labs    11/06/22 1549 06/16/23 1327 07/13/23 0804  WBC 7.6 7.1 9.1  NEUTROABS  --   --  5,788  HGB 13.3 12.6 12.4  HCT 40.7 39.4 39.0  MCV 84.6 87.9 85.3  PLT 454.0* 381 599*   Lipid Panel: Recent Labs    11/06/22 1549  CHOL 148  HDL 36.90*  LDLCALC 71  TRIG 197.0*  CHOLHDL 4   Lab Results  Component Value Date   HGBA1C 6.1 (H) 01/23/2021    Procedures since last visit: No results found. Results   LABS TSH: 3.42 (06/2023)      Assessment/Plan    Unsteadiness on feet Reports two recent falls, one on an [employer] train and another [place] steps, with no injuries. Feels unsteady at times. No prior physical therapy for gait training. Agrees to evaluation for cane use and physical therapy for gait and strength training. - Order physical therapy evaluation for gait and strength training.  Medication review for fall risk Currently taking venlafaxine , mirtazapine , and clonazepam , which may increase fall risk. Clonazepam  dose reduced to 0.5 mg. Willing to reduce venlafaxine  dose from 225 mg to 150 mg over three weeks. Monitor mood during dose reduction. Discussed potential impact on sleep and mood stability. - Prescribe venlafaxine  37.5 mg for three weeks, then discontinue and continue with 150 mg. - Send prescription to [facility] at [address] and [address]. - Monitor mood during dose reduction, especially at two-week mark.  Hypothyroidism Thyroid  level previously elevated post-surgery, now normalized at 3.42. Current levothyroxine  100 mcg dose is appropriate.  Vitamin B12 level high Previously elevated B12 levels. Stopped B12 supplementation. Plan to monitor levels to assess if dietary intake is sufficient. - Monitor B12 levels to assess if supplementation is needed.  Wellness Visit Due for annual wellness visit. Labs and blood pressure are well-managed. Weight is stable. - Schedule annual wellness visit with  nurse practitioner in the next couple of months.  General Health Maintenance Missed COVID vaccine while traveling. Plans to obtain it at [facility]. - Obtain COVID vaccine at [facility].          Labs/tests ordered:  * No order type specified * Next appt:  11/12/2023

## 2023-10-07 NOTE — Patient Instructions (Signed)
 VISIT SUMMARY:  Today, we discussed your recent falls, reviewed your medications, and addressed your general health maintenance. You reported feeling unsteady on your feet and we talked about ways to improve your stability. We also reviewed your current medications and made some adjustments to reduce your fall risk. Additionally, we discussed your thyroid  levels, vitamin B12 levels, and upcoming wellness visit.  YOUR PLAN:  -UNSTEADINESS ON FEET: You have experienced two recent falls and feel unsteady at times. This could be due to various factors including your footwear and lack of physical therapy. We will arrange for a physical therapy evaluation to help with gait and strength training, and you agreed to consider using a cane to improve your stability.  -MEDICATION REVIEW FOR FALL RISK: Some of your current medications, like venlafaxine  and clonazepam , may increase your risk of falling. We have reduced your clonazepam  dose to 0.5 mg and will gradually reduce your venlafaxine  dose from 225 mg to 150 mg over three weeks. Please monitor your mood during this period, especially at the two-week mark. Your new prescription will be sent to [facility] at [facility] and [address].  -HYPOTHYROIDISM: Your thyroid  levels, which were previously elevated, have now normalized. Your current dose of levothyroxine  at 100 mcg daily is appropriate and will be continued.  -VITAMIN B12 LEVEL HIGH: Your vitamin B12 levels were previously high, so you have stopped taking B12 supplements. We will monitor your levels to see if your dietary intake is sufficient.  Wendy Murray VISIT: You are due for your annual wellness visit. Your labs and blood pressure are well-managed, and your weight is stable. We will schedule this visit with a nurse practitioner in the next couple of months.  -GENERAL HEALTH MAINTENANCE: You missed your COVID vaccine while traveling. Please obtain it at [facility] as  planned.  INSTRUCTIONS:  Please follow up with the physical therapy evaluation for gait and strength training. Monitor your mood during the venlafaxine  dose reduction, especially at the two-week mark. Schedule your annual wellness visit with the nurse practitioner in the next couple of months. Obtain your COVID vaccine at [facility] as planned.

## 2023-10-13 DIAGNOSIS — Z9089 Acquired absence of other organs: Secondary | ICD-10-CM | POA: Diagnosis not present

## 2023-10-13 DIAGNOSIS — E210 Primary hyperparathyroidism: Secondary | ICD-10-CM | POA: Diagnosis not present

## 2023-10-13 DIAGNOSIS — Z9889 Other specified postprocedural states: Secondary | ICD-10-CM | POA: Diagnosis not present

## 2023-10-14 ENCOUNTER — Ambulatory Visit: Payer: Self-pay

## 2023-10-14 DIAGNOSIS — R2689 Other abnormalities of gait and mobility: Secondary | ICD-10-CM | POA: Diagnosis not present

## 2023-10-14 DIAGNOSIS — M6281 Muscle weakness (generalized): Secondary | ICD-10-CM | POA: Diagnosis not present

## 2023-10-14 DIAGNOSIS — M25551 Pain in right hip: Secondary | ICD-10-CM | POA: Diagnosis not present

## 2023-10-14 DIAGNOSIS — R278 Other lack of coordination: Secondary | ICD-10-CM | POA: Diagnosis not present

## 2023-10-14 DIAGNOSIS — R296 Repeated falls: Secondary | ICD-10-CM | POA: Diagnosis not present

## 2023-10-14 NOTE — Telephone Encounter (Signed)
 Patient scheduled to see PCP Valrie Gehrig, MD. Message routed as Arlie Lain.

## 2023-10-14 NOTE — Telephone Encounter (Signed)
 Chief Complaint: Right hip pain Symptoms: Right hip pain worsened with ambulation Frequency: 10 days Pertinent Negatives: Patient denies leg pain, back pain Disposition: [] ED /[] Urgent Care (no appt availability in office) / [x] Appointment(In office/virtual)/ []  [place] [facility]/ [] Home Care/ [] Refused Recommended Disposition /[] [place] [facility]/ []  Follow-up with PCP Additional Notes: Patient called in stating she has moderate pain (6 out of 10) in her right hip that is worsened with ambulation and turning. Patient believes it may be related to arthritis but would like evaluation. PCP schedule not populating with error showing block scheduling. This RN connected patient with Mariah Shines at clinic to assist with scheduling an appt within 3 days.    Reason for Disposition  [1] MODERATE pain (e.g., interferes with normal activities, limping) AND [2] present > 3 days  Answer Assessment - Initial Assessment Questions 1. LOCATION and RADIATION: "Where is the pain located?"      Right hip 2. QUALITY: "What does the pain feel like?"  (e.g., sharp, dull, aching, burning)     Dull 3. SEVERITY: "How bad is the pain?" "What does it keep you from doing?"   (Scale 1-10; or mild, moderate, severe)   -  MILD (1-3): doesn't interfere with normal activities    -  MODERATE (4-7): interferes with normal activities (e.g., work or school) or awakens from sleep, limping    -  SEVERE (8-10): excruciating pain, unable to do any normal activities, unable to walk     Moderate pain interfering with walking, otherwise 6/10 4. ONSET: "When did the pain start?" "Does it come and go, or is it there all the time?"     10  5. WORK OR EXERCISE: "Has there been any recent work or exercise that involved this part of the body?"      No 6. CAUSE: "What do you think is causing the hip pain?"      Arthritis possibly 7. AGGRAVATING FACTORS: "What makes the hip pain worse?" (e.g., walking, climbing stairs, running)      Walking or turning in bed causes the pain to flare up 8. OTHER SYMPTOMS: "Do you have any other symptoms?" (e.g., back pain, pain shooting down leg,  fever, rash)     No  Protocols used: Hip Pain-A-AH

## 2023-10-15 DIAGNOSIS — Z9089 Acquired absence of other organs: Secondary | ICD-10-CM | POA: Diagnosis not present

## 2023-10-15 DIAGNOSIS — Z9889 Other specified postprocedural states: Secondary | ICD-10-CM | POA: Diagnosis not present

## 2023-10-15 DIAGNOSIS — E210 Primary hyperparathyroidism: Secondary | ICD-10-CM | POA: Diagnosis not present

## 2023-10-16 ENCOUNTER — Ambulatory Visit
Admission: RE | Admit: 2023-10-16 | Discharge: 2023-10-16 | Disposition: A | Discharge status: Home / Self Care | Source: Ambulatory Visit | Attending: Student | Admitting: Student

## 2023-10-16 ENCOUNTER — Encounter: Payer: Self-pay | Admitting: Student

## 2023-10-16 ENCOUNTER — Telehealth: Payer: Self-pay

## 2023-10-16 ENCOUNTER — Ambulatory Visit
Admission: RE | Admit: 2023-10-16 | Discharge: 2023-10-16 | Disposition: A | Discharge status: Home / Self Care | Attending: Student | Admitting: Student

## 2023-10-16 ENCOUNTER — Ambulatory Visit: Admitting: Student

## 2023-10-16 VITALS — BP 146/84 | HR 89 | Temp 97.2°F | Ht 65.0 in | Wt 159.0 lb

## 2023-10-16 DIAGNOSIS — M25551 Pain in right hip: Secondary | ICD-10-CM

## 2023-10-16 DIAGNOSIS — M1611 Unilateral primary osteoarthritis, right hip: Secondary | ICD-10-CM | POA: Diagnosis not present

## 2023-10-16 NOTE — Telephone Encounter (Signed)
 URGENT CALL!!! [facility] called and said please review patient Imaging report. Message routed to PCP Valrie Gehrig, MD as [facility] Priority. I have also routed report to you as well.

## 2023-10-16 NOTE — Progress Notes (Signed)
 [facility]  Provider: Jann Melody  Code Status: [place] Code Goals of Care:     07/08/2023    1:05 PM  Advanced Directives  Does Patient Have a Medical Advance Directive? Yes  Type of Estate agent of [place];Out of facility DNR (pink MOST or yellow form);Living will  Does patient want to make changes to medical advance directive? No - Patient declined  Copy of Healthcare Power of Attorney in Chart? No - copy requested     Chief Complaint  Patient presents with   Hip Pain    Complain of Right Hip Pain and soreness.     HPI: Patient is a 88 y.o. female seen today for an acute visit for Discussed the use of AI scribe software for clinical note transcription with the patient, who gave verbal consent to proceed.  History of Present Illness   Wendy Murray "Wendy Murray" is an 88 year old female who presents with hip pain exacerbated by walking.  She has been experiencing mild soreness in her hip, which has recently worsened after walking a longer distance than usual. The pain is more pronounced when weight is placed on the hip during walking. It is localized to the hip area, particularly where it joins the torso, and does not radiate to the groin.  She has been using Aleve for pain management but has not taken Tylenol  since the flare-up. She occasionally uses Excedrin for headaches, which contains Tylenol  and aspirin , and avoids ibuprofen  due to personal preference.  Her past medical history includes knee replacement surgery two years ago and thyroid  surgery earlier this year, both of which she recovered from well.  No pain radiating down the hip. Pain is noted when putting weight on the hip during walking. No pain in the lower back or hamstring with straight leg raising.        Past Medical History:  Diagnosis Date   Anxiety    CHF (congestive heart failure) (HCC) 2022   diastolic   COVID-19 05/30/2021   Diverticulosis     DVT (deep venous thrombosis) (HCC) 06/16/2010   after TKR   Dyspnea    Hyperlipidemia    Hyperparathyroidism (HCC)    Hypertension    Hypothyroidism    IBS (irritable bowel syndrome)    Inflammatory arthritis    Osteoarthritis, knee    Recurrent major depression (HCC)    TIA (transient ischemic attack) 01/2021    Past Surgical History:  Procedure Laterality Date   ABDOMINAL HYSTERECTOMY  06/16/1989   CATARACT EXTRACTION W/ INTRAOCULAR LENS  IMPLANT, BILATERAL  06/16/2012   CHOLECYSTECTOMY  06/16/1996   COLONOSCOPY     DEXA  08/15/2007   Normal   HAMMER TOE SURGERY Left 10/15/2014   Dr Meriam Stamp   LUMBAR FUSION  06/17/1991   MENISCUS REPAIR Left 04/16/2010   PARATHYROIDECTOMY Left 06/25/2023   Procedure: NECK EXPLORATION WITH LEFT SUPERIOR PARATHYROIDECTOMY;  Surgeon: Oralee Billow, MD;  Location: [facility];  Service: General;  Laterality: Left;   PARATHYROIDECTOMY  06/25/2023   TOTAL KNEE ARTHROPLASTY Left 06/16/2010   TOTAL KNEE ARTHROPLASTY Right 07/09/2021   Procedure: TOTAL KNEE ARTHROPLASTY;  Surgeon: Elner Hahn, MD;  Location: [facility];  Service: Orthopedics;  Laterality: Right;   WRIST SURGERY Bilateral     No Known Allergies  Outpatient Encounter Medications as of 10/16/2023  Medication Sig   albuterol  (VENTOLIN  HFA) 108 (90 Base) MCG/ACT inhaler Inhale 2 puffs into the lungs every 6 (  six) hours as needed for wheezing or shortness of breath.   amoxicillin  (AMOXIL ) 500 MG tablet Take 4 tablets (2,000 mg total) by mouth daily. 1 hour before dental appointment   aspirin -acetaminophen -caffeine (EXCEDRIN MIGRAINE) 250-250-65 MG tablet Take 1 tablet by mouth every 6 (six) hours as needed for headache.   atorvastatin  (LIPITOR) 40 MG tablet Take 1 tablet (40 mg total) by mouth every evening.   cholecalciferol  (VITAMIN D3) 25 MCG (1000 UNIT) tablet Take 1,000 Units by mouth daily.   clonazePAM  (KLONOPIN ) 1 MG tablet Take 0.5 tablets (0.5 mg total) by mouth at bedtime.   ferrous  sulfate 325 (65 FE) MG tablet Take 325 mg by mouth daily with breakfast.   folic acid (FOLVITE) 400 MCG tablet Take 400 mcg by mouth daily.   hydroxychloroquine  (PLAQUENIL ) 200 MG tablet Take 200 mg by mouth 2 (two) times daily.   levothyroxine  (SYNTHROID ) 100 MCG tablet Take 1 tablet (100 mcg total) by mouth daily.   mirtazapine  (REMERON ) 15 MG tablet Take 1 tablet (15 mg total) by mouth at bedtime.   Multiple Vitamins-Minerals (IMMUNE SUPPORT PO) Take 1 tablet by mouth daily. With Zinc   Multiple Vitamins-Minerals (MULTIVITAMIN WITH MINERALS) tablet Take 2 tablets by mouth daily. Gummy   omeprazole  (PRILOSEC) 20 MG capsule Take 1 capsule (20 mg total) by mouth every evening.   Probiotic Product (ALIGN) 4 MG CAPS Take 4 mg by mouth at bedtime.   valsartan  (DIOVAN ) 160 MG tablet Take 1 tablet (160 mg total) by mouth daily.   venlafaxine  XR (EFFEXOR -XR) 150 MG 24 hr capsule TAKE 1 CAPSULE BY MOUTH EVERY DAY WITH 75 MG CAPSULE   venlafaxine  XR (EFFEXOR -XR) 37.5 MG 24 hr capsule TAKE 1 CAPSULE BY MOUTH EVERY DAY ALONG WITH 150 MG CAPSULE   No facility-administered encounter medications on file as of 10/16/2023.    Review of Systems:  Review of Systems  Health Maintenance  Topic Date Due   COVID-19 Vaccine (8 - 2024-25 season) 05/08/2023   Medicare Annual Wellness (AWV)  11/06/2023   INFLUENZA VACCINE  01/15/2024   DTaP/Tdap/Td (3 - Td or Tdap) 05/02/2031   Pneumonia Vaccine 21+ Years old  Completed   DEXA SCAN  Completed   Zoster Vaccines- Shingrix  Completed   HPV VACCINES  Aged Out   Meningococcal B Vaccine  Aged Out    Physical Exam: Vitals:   10/16/23 0842 10/16/23 0844  BP: (!) 146/84 (!) 146/84  Pulse: 89   Temp: (!) 97.2 F (36.2 C)   SpO2: 94%   Weight: 159 lb (72.1 kg)   Height: 5\' 5"  (1.651 m)    Body mass index is 26.46 kg/m. Physical Exam Physical Exam   MUSCULOSKELETAL: Pain with internal and external rotation of the hip. No pain with straight leg raising.  Tenderness in the groin crease. No pain on back palpation.       Labs reviewed: Basic Metabolic Panel: Recent Labs    11/06/22 1549 03/10/23 1318 06/16/23 1327 06/26/23 0449 07/13/23 0804 09/07/23 0740  NA 140 141 137  --  140  --   K 5.0 4.7 4.4  --  3.9  --   CL 103 104 105  --  105  --   CO2 29 29 27   --  27  --   GLUCOSE 94 110* 105*  --  92  --   BUN 18 18 16   --  14  --   CREATININE 0.90 0.91 0.75  --  0.69  --  CALCIUM  10.7* 10.7* 10.2 8.7* 8.9  8.9  --   PHOS 3.3 3.4  --   --   --   --   TSH 3.46 2.13  --   --  7.11* 3.42   Liver Function Tests: Recent Labs    11/06/22 1549 03/10/23 1318 07/13/23 0804  AST 19  --  18  ALT 21  --  19  ALKPHOS 132*  --   --   BILITOT 0.5  --  0.4  PROT 6.8  --  6.4  ALBUMIN 4.2  4.2 4.1  --    No results for input(s): "LIPASE", "AMYLASE" in the last 8760 hours. No results for input(s): "AMMONIA" in the last 8760 hours. CBC: Recent Labs    11/06/22 1549 06/16/23 1327 07/13/23 0804  WBC 7.6 7.1 9.1  NEUTROABS  --   --  5,788  HGB 13.3 12.6 12.4  HCT 40.7 39.4 39.0  MCV 84.6 87.9 85.3  PLT 454.0* 381 599*   Lipid Panel: Recent Labs    11/06/22 1549  CHOL 148  HDL 36.90*  LDLCALC 71  TRIG 197.0*  CHOLHDL 4   Lab Results  Component Value Date   HGBA1C 6.1 (H) 01/23/2021    Procedures since last visit: No results found. Results           Assessment/Plan     Hip arthritis Chronic hip arthritis with recent exacerbation of pain due to increased ambulation. Pain is localized to the hip and worsens with weight-bearing activities. Differential diagnosis includes possible small hip fractures. Physical examination supports hip arthritis over muscular issues. Discussed treatment options including hip injections and hip replacement surgery. Risks of long-term NSAID use include gastric irritation and gastrointestinal bleeding. Discussed potential benefits and risks of hip replacement surgery, including  anesthesia and post-operative care. Emphasized the need for post-surgical support due to potential lingering effects of anesthesia. - Order hip x-ray to assess severity and rule out fractures. - Continue current pain management with naproxen, ensuring not to exceed two weeks of scheduled use. - Consider orthopedic consultation for potential hip injections if x-ray indicates severe arthritis. - Discuss the possibility of hip replacement surgery if conservative measures fail.          Labs/tests ordered:  * No order type specified * Next appt:  11/12/2023

## 2023-10-16 NOTE — Patient Instructions (Addendum)
 Address: [address], [place], [place] [address] Phone: [phone]  VISIT SUMMARY:  Today, we discussed your hip pain, which has worsened recently, especially when walking. We reviewed your medical history, including your previous knee and thyroid  surgeries, and examined your hip to determine the cause of your pain.  YOUR PLAN:  -HIP ARTHRITIS: Hip arthritis is a condition where the cartilage in the hip joint wears down, causing pain and stiffness. Your recent increase in walking has likely exacerbated this condition. We will order a hip x-ray to assess the severity and rule out any fractures. Continue using naproxen for pain management, but do not exceed two weeks of scheduled use. If the x-ray shows severe arthritis, we may consider hip injections. If conservative treatments do not help, we can discuss the possibility of hip replacement surgery.  INSTRUCTIONS:  Please schedule a hip x-ray as soon as possible. Continue taking naproxen for pain, but do not use it for more than two weeks continuously. If your pain persists or worsens, we may need to consider an orthopedic consultation for potential hip injections or discuss hip replacement surgery if necessary.

## 2023-10-16 NOTE — Telephone Encounter (Signed)
 Wendy Gehrig, MD  You15 minutes ago (2:03 PM)   VB Thank you! Imaging report reviewed

## 2023-10-17 ENCOUNTER — Telehealth: Payer: Self-pay | Admitting: Student

## 2023-10-17 ENCOUNTER — Ambulatory Visit
Admission: RE | Admit: 2023-10-17 | Discharge: 2023-10-17 | Disposition: A | Discharge status: Home / Self Care | Source: Ambulatory Visit | Attending: Student | Admitting: Student

## 2023-10-17 DIAGNOSIS — M1611 Unilateral primary osteoarthritis, right hip: Secondary | ICD-10-CM | POA: Diagnosis not present

## 2023-10-17 DIAGNOSIS — M25551 Pain in right hip: Secondary | ICD-10-CM | POA: Insufficient documentation

## 2023-10-17 DIAGNOSIS — M948X5 Other specified disorders of cartilage, thigh: Secondary | ICD-10-CM | POA: Diagnosis not present

## 2023-10-17 DIAGNOSIS — S73191A Other sprain of right hip, initial encounter: Secondary | ICD-10-CM | POA: Diagnosis not present

## 2023-10-17 MED ORDER — GADOBUTROL 1 MMOL/ML IV SOLN
7.0000 mL | Freq: Once | INTRAVENOUS | Status: AC | PRN
  Administered 2023-10-17: 7 mL via INTRAVENOUS

## 2023-10-17 NOTE — Telephone Encounter (Signed)
 Called patient to relay results of MRI concerning for severe arthritis. No sign of fracture at this time. Will plan to proceed with orthopedics referral for potential injection. She will contact PT to restart services for outcome optimization.

## 2023-10-19 DIAGNOSIS — H5007 Alternating esotropia with V pattern: Secondary | ICD-10-CM | POA: Diagnosis not present

## 2023-10-19 DIAGNOSIS — H532 Diplopia: Secondary | ICD-10-CM | POA: Diagnosis not present

## 2023-10-19 DIAGNOSIS — Z961 Presence of intraocular lens: Secondary | ICD-10-CM | POA: Diagnosis not present

## 2023-10-23 DIAGNOSIS — Z23 Encounter for immunization: Secondary | ICD-10-CM | POA: Diagnosis not present

## 2023-10-30 ENCOUNTER — Other Ambulatory Visit: Payer: Self-pay | Admitting: Student

## 2023-10-30 DIAGNOSIS — F3341 Major depressive disorder, recurrent, in partial remission: Secondary | ICD-10-CM

## 2023-10-30 NOTE — Telephone Encounter (Signed)
 Pharmacy requested refill.  Pended Rx and sent to Dr. Sydnee Cabal for approval due to HIGH ALERT Warning.

## 2023-11-02 DIAGNOSIS — R278 Other lack of coordination: Secondary | ICD-10-CM | POA: Diagnosis not present

## 2023-11-02 DIAGNOSIS — M25551 Pain in right hip: Secondary | ICD-10-CM | POA: Diagnosis not present

## 2023-11-02 DIAGNOSIS — R2689 Other abnormalities of gait and mobility: Secondary | ICD-10-CM | POA: Diagnosis not present

## 2023-11-02 DIAGNOSIS — R296 Repeated falls: Secondary | ICD-10-CM | POA: Diagnosis not present

## 2023-11-02 DIAGNOSIS — M6281 Muscle weakness (generalized): Secondary | ICD-10-CM | POA: Diagnosis not present

## 2023-11-05 DIAGNOSIS — R296 Repeated falls: Secondary | ICD-10-CM | POA: Diagnosis not present

## 2023-11-05 DIAGNOSIS — M25551 Pain in right hip: Secondary | ICD-10-CM | POA: Diagnosis not present

## 2023-11-05 DIAGNOSIS — M6281 Muscle weakness (generalized): Secondary | ICD-10-CM | POA: Diagnosis not present

## 2023-11-05 DIAGNOSIS — R278 Other lack of coordination: Secondary | ICD-10-CM | POA: Diagnosis not present

## 2023-11-05 DIAGNOSIS — R2689 Other abnormalities of gait and mobility: Secondary | ICD-10-CM | POA: Diagnosis not present

## 2023-11-10 DIAGNOSIS — R2689 Other abnormalities of gait and mobility: Secondary | ICD-10-CM | POA: Diagnosis not present

## 2023-11-10 DIAGNOSIS — M6281 Muscle weakness (generalized): Secondary | ICD-10-CM | POA: Diagnosis not present

## 2023-11-10 DIAGNOSIS — R278 Other lack of coordination: Secondary | ICD-10-CM | POA: Diagnosis not present

## 2023-11-10 DIAGNOSIS — R296 Repeated falls: Secondary | ICD-10-CM | POA: Diagnosis not present

## 2023-11-10 DIAGNOSIS — M25551 Pain in right hip: Secondary | ICD-10-CM | POA: Diagnosis not present

## 2023-11-11 ENCOUNTER — Ambulatory Visit: Admitting: Orthopedic Surgery

## 2023-11-11 VITALS — BP 152/84 | Ht 65.0 in | Wt 159.0 lb

## 2023-11-11 DIAGNOSIS — M1611 Unilateral primary osteoarthritis, right hip: Secondary | ICD-10-CM

## 2023-11-11 NOTE — Patient Instructions (Signed)
 We discussed right total hip replacement  We will need to secure medical clearance before surgery  Once we have clearance, we can schedule surgery

## 2023-11-11 NOTE — Progress Notes (Unsigned)
 New Patient Visit  Assessment: Wendy Murray is a 88 y.o. female with the following: 1. Arthritis of right hip ***   Plan: Cheryn Coss   Follow-up: No follow-ups on file.  Subjective:  Chief Complaint  Patient presents with   Hip Pain    Pain in the right hip  she has had x rays and MRI in the papst month and states she had arthritis    History of Present Illness: CADY HAFEN is a 88 y.o. female who {Presentation:27320} for evaluation of    Review of Systems: No fevers or chills*** No numbness or tingling No chest pain No shortness of breath No bowel or bladder dysfunction No GI distress No headaches   Medical History:  Past Medical History:  Diagnosis Date   Anxiety    CHF (congestive heart failure) (HCC) 2022   diastolic   COVID-19 05/30/2021   Diverticulosis    DVT (deep venous thrombosis) (HCC) 06/16/2010   after TKR   Dyspnea    Hyperlipidemia    Hyperparathyroidism (HCC)    Hypertension    Hypothyroidism    IBS (irritable bowel syndrome)    Inflammatory arthritis    Osteoarthritis, knee    Recurrent major depression (HCC)    TIA (transient ischemic attack) 01/2021    Past Surgical History:  Procedure Laterality Date   ABDOMINAL HYSTERECTOMY  06/16/1989   CATARACT EXTRACTION W/ INTRAOCULAR LENS  IMPLANT, BILATERAL  06/16/2012   CHOLECYSTECTOMY  06/16/1996   COLONOSCOPY     DEXA  08/15/2007   Normal   HAMMER TOE SURGERY Left 10/15/2014   Dr Meriam Stamp   LUMBAR FUSION  06/17/1991   MENISCUS REPAIR Left 04/16/2010   PARATHYROIDECTOMY Left 06/25/2023   Procedure: NECK EXPLORATION WITH LEFT SUPERIOR PARATHYROIDECTOMY;  Surgeon: Oralee Billow, MD;  Location: [facility];  Service: General;  Laterality: Left;   PARATHYROIDECTOMY  06/25/2023   TOTAL KNEE ARTHROPLASTY Left 06/16/2010   TOTAL KNEE ARTHROPLASTY Right 07/09/2021   Procedure: TOTAL KNEE ARTHROPLASTY;  Surgeon: Elner Hahn, MD;  Location: [facility];  Service:  Orthopedics;  Laterality: Right;   WRIST SURGERY Bilateral     Family History  Problem Relation Age of Onset   Cancer Mother        colon   Depression Mother    Cancer Father    Depression Sister    Hypertension Neg Hx    Heart disease Neg Hx    Diabetes Neg Hx    Social History   Tobacco Use   Smoking status: Former    Current packs/day: 0.00    Types: Cigarettes    Quit date: 06/16/1981    Years since quitting: 42.4    Passive exposure: Past (as a child)   Smokeless tobacco: Never  Vaping Use   Vaping status: Never Used  Substance Use Topics   Alcohol use: Not Currently    Comment: rarely   Drug use: No    No Known Allergies  Current Meds  Medication Sig   albuterol  (VENTOLIN  HFA) 108 (90 Base) MCG/ACT inhaler Inhale 2 puffs into the lungs every 6 (six) hours as needed for wheezing or shortness of breath.   amoxicillin  (AMOXIL ) 500 MG tablet Take 4 tablets (2,000 mg total) by mouth daily. 1 hour before dental appointment   aspirin -acetaminophen -caffeine (EXCEDRIN MIGRAINE) 250-250-65 MG tablet Take 1 tablet by mouth every 6 (six) hours as needed for headache.   atorvastatin  (LIPITOR) 40 MG tablet Take 1 tablet (40 mg total)  by mouth every evening.   cholecalciferol  (VITAMIN D3) 25 MCG (1000 UNIT) tablet Take 1,000 Units by mouth daily.   clonazePAM  (KLONOPIN ) 1 MG tablet Take 0.5 tablets (0.5 mg total) by mouth at bedtime.   ferrous sulfate  325 (65 FE) MG tablet Take 325 mg by mouth daily with breakfast.   folic acid (FOLVITE) 400 MCG tablet Take 400 mcg by mouth daily.   hydroxychloroquine  (PLAQUENIL ) 200 MG tablet Take 200 mg by mouth 2 (two) times daily.   levothyroxine  (SYNTHROID ) 100 MCG tablet Take 1 tablet (100 mcg total) by mouth daily.   mirtazapine  (REMERON ) 15 MG tablet Take 1 tablet (15 mg total) by mouth at bedtime.   Multiple Vitamins-Minerals (IMMUNE SUPPORT PO) Take 1 tablet by mouth daily. With Zinc   Multiple Vitamins-Minerals (MULTIVITAMIN WITH  MINERALS) tablet Take 2 tablets by mouth daily. Gummy   omeprazole  (PRILOSEC) 20 MG capsule Take 1 capsule (20 mg total) by mouth every evening.   Probiotic Product (ALIGN) 4 MG CAPS Take 4 mg by mouth at bedtime.   valsartan  (DIOVAN ) 160 MG tablet Take 1 tablet (160 mg total) by mouth daily.   venlafaxine  XR (EFFEXOR -XR) 150 MG 24 hr capsule TAKE 1 CAPSULE BY MOUTH EVERY DAY WITH 75 MG CAPSULE    Objective: BP (!) 152/84   Ht 5\' 5"  (1.651 m)   Wt 159 lb (72.1 kg)   BMI 26.46 kg/m   Physical Exam:  General: {General PE Findings:25791} Gait: {Gait:25792}    IMAGING: {XR Reviewed:24899}   New Medications:  No orders of the defined types were placed in this encounter.     Tonita Frater, MD  11/11/2023 3:43 PM

## 2023-11-11 NOTE — H&P (View-Only) (Signed)
 New Patient Visit  Assessment: Wendy Murray is a 88 y.o. female with the following: 1. Arthritis of right hip ***   Plan: Cheryn Coss   Follow-up: No follow-ups on file.  Subjective:  Chief Complaint  Patient presents with   Hip Pain    Pain in the right hip  she has had x rays and MRI in the papst month and states she had arthritis    History of Present Illness: Wendy Murray is a 88 y.o. female who {Presentation:27320} for evaluation of    Review of Systems: No fevers or chills*** No numbness or tingling No chest pain No shortness of breath No bowel or bladder dysfunction No GI distress No headaches   Medical History:  Past Medical History:  Diagnosis Date   Anxiety    CHF (congestive heart failure) (HCC) 2022   diastolic   COVID-19 05/30/2021   Diverticulosis    DVT (deep venous thrombosis) (HCC) 06/16/2010   after TKR   Dyspnea    Hyperlipidemia    Hyperparathyroidism (HCC)    Hypertension    Hypothyroidism    IBS (irritable bowel syndrome)    Inflammatory arthritis    Osteoarthritis, knee    Recurrent major depression (HCC)    TIA (transient ischemic attack) 01/2021    Past Surgical History:  Procedure Laterality Date   ABDOMINAL HYSTERECTOMY  06/16/1989   CATARACT EXTRACTION W/ INTRAOCULAR LENS  IMPLANT, BILATERAL  06/16/2012   CHOLECYSTECTOMY  06/16/1996   COLONOSCOPY     DEXA  08/15/2007   Normal   HAMMER TOE SURGERY Left 10/15/2014   Dr Meriam Stamp   LUMBAR FUSION  06/17/1991   MENISCUS REPAIR Left 04/16/2010   PARATHYROIDECTOMY Left 06/25/2023   Procedure: NECK EXPLORATION WITH LEFT SUPERIOR PARATHYROIDECTOMY;  Surgeon: Oralee Billow, MD;  Location: [facility];  Service: General;  Laterality: Left;   PARATHYROIDECTOMY  06/25/2023   TOTAL KNEE ARTHROPLASTY Left 06/16/2010   TOTAL KNEE ARTHROPLASTY Right 07/09/2021   Procedure: TOTAL KNEE ARTHROPLASTY;  Surgeon: Elner Hahn, MD;  Location: [facility];  Service:  Orthopedics;  Laterality: Right;   WRIST SURGERY Bilateral     Family History  Problem Relation Age of Onset   Cancer Mother        colon   Depression Mother    Cancer Father    Depression Sister    Hypertension Neg Hx    Heart disease Neg Hx    Diabetes Neg Hx    Social History   Tobacco Use   Smoking status: Former    Current packs/day: 0.00    Types: Cigarettes    Quit date: 06/16/1981    Years since quitting: 42.4    Passive exposure: Past (as a child)   Smokeless tobacco: Never  Vaping Use   Vaping status: Never Used  Substance Use Topics   Alcohol use: Not Currently    Comment: rarely   Drug use: No    No Known Allergies  Current Meds  Medication Sig   albuterol  (VENTOLIN  HFA) 108 (90 Base) MCG/ACT inhaler Inhale 2 puffs into the lungs every 6 (six) hours as needed for wheezing or shortness of breath.   amoxicillin  (AMOXIL ) 500 MG tablet Take 4 tablets (2,000 mg total) by mouth daily. 1 hour before dental appointment   aspirin -acetaminophen -caffeine (EXCEDRIN MIGRAINE) 250-250-65 MG tablet Take 1 tablet by mouth every 6 (six) hours as needed for headache.   atorvastatin  (LIPITOR) 40 MG tablet Take 1 tablet (40 mg total)  by mouth every evening.   cholecalciferol  (VITAMIN D3) 25 MCG (1000 UNIT) tablet Take 1,000 Units by mouth daily.   clonazePAM  (KLONOPIN ) 1 MG tablet Take 0.5 tablets (0.5 mg total) by mouth at bedtime.   ferrous sulfate  325 (65 FE) MG tablet Take 325 mg by mouth daily with breakfast.   folic acid (FOLVITE) 400 MCG tablet Take 400 mcg by mouth daily.   hydroxychloroquine  (PLAQUENIL ) 200 MG tablet Take 200 mg by mouth 2 (two) times daily.   levothyroxine  (SYNTHROID ) 100 MCG tablet Take 1 tablet (100 mcg total) by mouth daily.   mirtazapine  (REMERON ) 15 MG tablet Take 1 tablet (15 mg total) by mouth at bedtime.   Multiple Vitamins-Minerals (IMMUNE SUPPORT PO) Take 1 tablet by mouth daily. With Zinc   Multiple Vitamins-Minerals (MULTIVITAMIN WITH  MINERALS) tablet Take 2 tablets by mouth daily. Gummy   omeprazole  (PRILOSEC) 20 MG capsule Take 1 capsule (20 mg total) by mouth every evening.   Probiotic Product (ALIGN) 4 MG CAPS Take 4 mg by mouth at bedtime.   valsartan  (DIOVAN ) 160 MG tablet Take 1 tablet (160 mg total) by mouth daily.   venlafaxine  XR (EFFEXOR -XR) 150 MG 24 hr capsule TAKE 1 CAPSULE BY MOUTH EVERY DAY WITH 75 MG CAPSULE    Objective: BP (!) 152/84   Ht 5\' 5"  (1.651 m)   Wt 159 lb (72.1 kg)   BMI 26.46 kg/m   Physical Exam:  General: {General PE Findings:25791} Gait: {Gait:25792}    IMAGING: {XR Reviewed:24899}   New Medications:  No orders of the defined types were placed in this encounter.     Tonita Frater, MD  11/11/2023 3:43 PM

## 2023-11-12 ENCOUNTER — Ambulatory Visit: Admitting: Nurse Practitioner

## 2023-11-12 ENCOUNTER — Telehealth: Payer: Self-pay | Admitting: *Deleted

## 2023-11-12 VITALS — HR 94 | Temp 98.7°F | Resp 20 | Wt 158.6 lb

## 2023-11-12 DIAGNOSIS — Z0000 Encounter for general adult medical examination without abnormal findings: Secondary | ICD-10-CM | POA: Diagnosis not present

## 2023-11-12 DIAGNOSIS — M6281 Muscle weakness (generalized): Secondary | ICD-10-CM | POA: Diagnosis not present

## 2023-11-12 DIAGNOSIS — R296 Repeated falls: Secondary | ICD-10-CM | POA: Diagnosis not present

## 2023-11-12 DIAGNOSIS — R278 Other lack of coordination: Secondary | ICD-10-CM | POA: Diagnosis not present

## 2023-11-12 DIAGNOSIS — M25551 Pain in right hip: Secondary | ICD-10-CM | POA: Diagnosis not present

## 2023-11-12 DIAGNOSIS — R2689 Other abnormalities of gait and mobility: Secondary | ICD-10-CM | POA: Diagnosis not present

## 2023-11-12 NOTE — Progress Notes (Signed)
 Subjective:   Wendy Murray is a 88 y.o. female who presents for Medicare Annual (Subsequent) preventive examination.  Visit Complete: In person at [facility]    Cardiac Risk Factors include: advanced age (>29men, >51 women);dyslipidemia;sedentary lifestyle     Objective:     Today's Vitals   11/12/23 1014  Pulse: 94  Resp: 20  Temp: 98.7 F (37.1 C)  TempSrc: Oral  SpO2: 98%  Weight: 158 lb 9.6 oz (71.9 kg)   Body mass index is 26.39 kg/m.     07/08/2023    1:05 PM 06/25/2023    6:36 PM 06/16/2023    1:06 PM 04/28/2023    9:47 AM 05/20/2022    5:18 PM 07/09/2021    5:00 PM 07/09/2021    6:52 AM  Advanced Directives  Does Patient Have a Medical Advance Directive? Yes Yes Yes Yes Yes Yes Yes  Type of Estate agent of [place];Out of facility DNR (pink MOST or yellow form);Living will Living will;Healthcare Power of Attorney Living will;Healthcare Power of [employer] Power of Halls;Out of facility DNR (pink MOST or yellow form);Living will  Healthcare Power of [employer] of [place];Living will  Does patient want to make changes to medical advance directive? No - Patient declined No - Patient declined  No - Patient declined  No - Patient declined No - Patient declined  Copy of Healthcare Power of Attorney in Chart? No - copy requested No - copy requested  No - copy requested  No - copy requested No - copy requested    Current Medications (verified) Outpatient Encounter Medications as of 11/12/2023  Medication Sig   albuterol  (VENTOLIN  HFA) 108 (90 Base) MCG/ACT inhaler Inhale 2 puffs into the lungs every 6 (six) hours as needed for wheezing or shortness of breath.   amoxicillin  (AMOXIL ) 500 MG tablet Take 4 tablets (2,000 mg total) by mouth daily. 1 hour before dental appointment   aspirin -acetaminophen -caffeine (EXCEDRIN MIGRAINE) 250-250-65 MG tablet Take 1 tablet by mouth every 6 (six) hours as needed for  headache.   atorvastatin  (LIPITOR) 40 MG tablet Take 1 tablet (40 mg total) by mouth every evening.   cholecalciferol  (VITAMIN D3) 25 MCG (1000 UNIT) tablet Take 1,000 Units by mouth daily.   clonazePAM  (KLONOPIN ) 1 MG tablet Take 0.5 tablets (0.5 mg total) by mouth at bedtime.   ferrous sulfate  325 (65 FE) MG tablet Take 325 mg by mouth daily with breakfast.   folic acid (FOLVITE) 400 MCG tablet Take 400 mcg by mouth daily.   hydroxychloroquine  (PLAQUENIL ) 200 MG tablet Take 200 mg by mouth 2 (two) times daily.   levothyroxine  (SYNTHROID ) 100 MCG tablet Take 1 tablet (100 mcg total) by mouth daily.   mirtazapine  (REMERON ) 15 MG tablet Take 1 tablet (15 mg total) by mouth at bedtime.   Multiple Vitamins-Minerals (IMMUNE SUPPORT PO) Take 1 tablet by mouth daily. With Zinc   Multiple Vitamins-Minerals (MULTIVITAMIN WITH MINERALS) tablet Take 2 tablets by mouth daily. Gummy   omeprazole  (PRILOSEC) 20 MG capsule Take 1 capsule (20 mg total) by mouth every evening.   Probiotic Product (ALIGN) 4 MG CAPS Take 4 mg by mouth at bedtime.   valsartan  (DIOVAN ) 160 MG tablet Take 1 tablet (160 mg total) by mouth daily.   venlafaxine  XR (EFFEXOR -XR) 150 MG 24 hr capsule TAKE 1 CAPSULE BY MOUTH EVERY DAY WITH 75 MG CAPSULE   [DISCONTINUED] venlafaxine  XR (EFFEXOR -XR) 37.5 MG 24 hr capsule TAKE 1 CAPSULE BY  MOUTH EVERY DAY WITH 150 MG CAPSULE   No facility-administered encounter medications on file as of 11/12/2023.    Allergies (verified) Patient has no known allergies.   History: Past Medical History:  Diagnosis Date   Anxiety    CHF (congestive heart failure) (HCC) 2022   diastolic   COVID-19 05/30/2021   Diverticulosis    DVT (deep venous thrombosis) (HCC) 06/16/2010   after TKR   Dyspnea    Hyperlipidemia    Hyperparathyroidism (HCC)    Hypertension    Hypothyroidism    IBS (irritable bowel syndrome)    Inflammatory arthritis    Osteoarthritis, knee    Recurrent major depression (HCC)     TIA (transient ischemic attack) 01/2021   Past Surgical History:  Procedure Laterality Date   ABDOMINAL HYSTERECTOMY  06/16/1989   CATARACT EXTRACTION W/ INTRAOCULAR LENS  IMPLANT, BILATERAL  06/16/2012   CHOLECYSTECTOMY  06/16/1996   COLONOSCOPY     DEXA  08/15/2007   Normal   HAMMER TOE SURGERY Left 10/15/2014   Dr Meriam Stamp   LUMBAR FUSION  06/17/1991   MENISCUS REPAIR Left 04/16/2010   PARATHYROIDECTOMY Left 06/25/2023   Procedure: NECK EXPLORATION WITH LEFT SUPERIOR PARATHYROIDECTOMY;  Surgeon: Oralee Billow, MD;  Location: [facility];  Service: General;  Laterality: Left;   PARATHYROIDECTOMY  06/25/2023   TOTAL KNEE ARTHROPLASTY Left 06/16/2010   TOTAL KNEE ARTHROPLASTY Right 07/09/2021   Procedure: TOTAL KNEE ARTHROPLASTY;  Surgeon: Elner Hahn, MD;  Location: [facility];  Service: Orthopedics;  Laterality: Right;   WRIST SURGERY Bilateral    Family History  Problem Relation Age of Onset   Cancer Mother        colon   Depression Mother    Cancer Father    Depression Sister    Hypertension Neg Hx    Heart disease Neg Hx    Diabetes Neg Hx    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    Comment: Retired  Tobacco Use   Smoking status: Former    Current packs/day: 0.00    Types: Cigarettes    Quit date: 06/16/1981    Years since quitting: 42.4    Passive exposure: Past (as a child)   Smokeless tobacco: Never  Vaping Use   Vaping status: Never Used  Substance and Sexual Activity   Alcohol use: Not Currently    Comment: rarely   Drug use: No   Sexual activity: Not Currently  Other Topics Concern   Not on file  Social History Narrative   Children in [place] , [place]  and [place]    Widowed 1/23      Has living will     Daughter Daivd Dub, have health care POA   Has DNR ---order rewritten   No feeding tube if cognitively unaware   Will be donating body to [employer]   Social Drivers of Health   Financial Resource Strain: Low Risk  (07/04/2023)   Overall Financial Resource Strain (CARDIA)    Difficulty of Paying Living Expenses: Not hard at all  Food Insecurity: No Food Insecurity (07/04/2023)   Hunger Vital Sign    Worried About Running Out of Food in the Last Year: Never true    Ran  Out of Food in the Last Year: Never true  Transportation Needs: No Transportation Needs (07/04/2023)   PRAPARE - Administrator, Civil Service (Medical): No    Lack of Transportation (Non-Medical): No  Physical Activity: Unknown (07/04/2023)   Exercise Vital Sign    Days of Exercise per Week: 0 days    Minutes of Exercise per Session: Not on file  Stress: Stress Concern Present (07/04/2023)   [employer] of Occupational Health - Occupational Stress Questionnaire    Feeling of Stress : Rather much  Social Connections: Socially Isolated (07/04/2023)   Social Connection and Isolation Panel [NHANES]    Frequency of Communication with Friends and Family: Once a week    Frequency of Social Gatherings with Friends and Family: Once a week    Attends Religious Services: More than 4 times per year    Active Member of [employer] or Organizations: No    Attends Banker Meetings: Patient declined    Marital Status: Widowed    Tobacco Counseling Counseling given: Not Answered   Clinical Intake:  Pre-visit preparation completed: Yes  Pain : No/denies pain     BMI - recorded: 26.39 Diabetes: No  How often do you need to have someone help you when you read instructions, pamphlets, or other written materials from your doctor or pharmacy?: 1 - Never         Activities of Daily Living    11/12/2023   10:17 AM 11/11/2023    6:03 PM  In your present state of health, do you have any  difficulty performing the following activities:  Hearing? 0 0  Vision? 0 0  Difficulty concentrating or making decisions? 0 0  Walking or climbing stairs? 0 0  Dressing or bathing? 0 0  Doing errands, shopping? 0 0  Preparing Food and eating ? N N  Using the Toilet? N N  In the past six months, have you accidently leaked urine? N N  Do you have problems with loss of bowel control? N N  Managing your Medications? N N  Managing your Finances? N N  Housekeeping or managing your Housekeeping? N N    Patient Care Team: Valrie Gehrig, MD as PCP - General (Family Medicine)  Indicate any recent Medical Services you may have received from other than [facility] providers in the past year (date may be approximate).     Assessment:    This is a routine wellness examination for [place].  Hearing/Vision screen No results found.   Goals Addressed   None    Depression Screen    11/12/2023   10:21 AM 10/07/2023    2:23 PM 07/08/2023    1:05 PM 11/06/2022    3:27 PM 11/06/2022    2:57 PM 11/04/2021    2:47 PM 04/02/2021   12:09 PM  PHQ 2/9 Scores  PHQ - 2 Score 1 0 0 1 2  0  PHQ- 9 Score     3  0  Exception Documentation      Medical reason     Fall Risk    11/12/2023   10:20 AM 11/11/2023    6:03 PM 10/07/2023    2:23 PM 07/08/2023    1:05 PM 11/06/2022    3:27 PM  Fall Risk   Falls in the past year? 1 1 1  0 0  Number falls in past yr: 1 1 1  0   Injury with Fall? 0 0 0 0   Risk for fall  due to : History of fall(s);Impaired balance/gait  No Fall Risks    Follow up Falls evaluation completed  Falls evaluation completed      MEDICARE RISK AT HOME: Medicare Risk at Home Any stairs in or around the home?: No If so, are there any without handrails?: (Patient-Rptd) No Home free of loose throw rugs in walkways, pet beds, electrical cords, etc?: Yes Adequate lighting in your home to reduce risk of falls?: Yes Life alert?: Yes Use of a cane, walker or w/c?: Yes Grab bars in the  bathroom?: Yes Shower chair or bench in shower?: Yes Elevated toilet seat or a handicapped toilet?: Yes  TIMED UP AND GO:  Was the test performed?  No    Cognitive Function:        11/12/2023   10:22 AM  6CIT Screen  What Year? 0 points  What month? 0 points  What time? 0 points  Count back from 20 0 points  Months in reverse 0 points  Repeat phrase 2 points  Total Score 2 points    Immunizations Immunization History  Administered Date(s) Administered   Fluad Quad(high Dose 65+) 03/04/2019, 02/08/2021   Fluad Trivalent(High Dose 65+) 03/10/2023   Influenza Split 03/16/2013   Influenza, High Dose Seasonal PF 04/09/2016   Influenza, Seasonal, Injecte, Preservative Fre 03/17/2015   Influenza-Unspecified 03/16/2014, 03/16/2017, 04/09/2018   Moderna Sars-Covid-2 Vaccination 07/01/2019, 08/01/2019, 05/01/2020, 11/01/2020, 03/13/2023   Pfizer Covid-19 Vaccine Bivalent Booster 69yrs & up 03/07/2021   Pneumococcal Conjugate-13 12/12/2013   Pneumococcal Polysaccharide-23 06/16/2004, 12/16/2016   RSV,unspecified 06/18/2022   Tdap 07/17/2009, 05/01/2021   Unspecified SARS-COV-2 Vaccination 05/23/2022   Varicella 05/01/2021   Zoster Recombinant(Shingrix) 01/18/2020, 05/01/2021   Zoster, Live 06/16/2010    TDAP status: Up to date  Flu Vaccine status: Up to date  Pneumococcal vaccine status: Up to date  Covid-19 vaccine status: Information provided on how to obtain vaccines.   Qualifies for Shingles Vaccine? Yes   Zostavax completed No   Shingrix Completed?: Yes  Screening Tests Health Maintenance  Topic Date Due   COVID-19 Vaccine (8 - 2024-25 season) 05/08/2023   INFLUENZA VACCINE  01/15/2024   DEXA SCAN  11/10/2024   Medicare Annual Wellness (AWV)  11/11/2024   DTaP/Tdap/Td (3 - Td or Tdap) 05/02/2031   Pneumonia Vaccine 77+ Years old  Completed   Zoster Vaccines- Shingrix  Completed   HPV VACCINES  Aged Out   Meningococcal B Vaccine  Aged Out    Health  Maintenance  Health Maintenance Due  Topic Date Due   COVID-19 Vaccine (8 - 2024-25 season) 05/08/2023    Colorectal cancer screening: No longer required.   Mammogram status: No longer required due to aged out.  Bone Density status: Completed 11/11/2022. Results reflect: Bone density results: OSTEOPENIA. Repeat every 2 years.  Lung Cancer Screening: (Low Dose CT Chest recommended if Age 81-80 years, 20 pack-year currently smoking OR have quit w/in 15years.) does not qualify.   Lung Cancer Screening Referral: na  Additional Screening:  Hepatitis C Screening: does not qualify  Vision Screening: Recommended annual ophthalmology exams for early detection of glaucoma and other disorders of the eye. Is the patient up to date with their annual eye exam?  Yes  Who is the provider or what is the name of the office in which the patient attends annual eye exams? [facility] If pt is not established with a provider, would they like to be referred to a provider to establish care? No .  Dental Screening: Recommended annual dental exams for proper oral hygiene   Community Resource Referral / Chronic Care Management: CRR required this visit?  No   CCM required this visit?  No     Plan:     I have personally reviewed and noted the following in the patient's chart:   Medical and social history Use of alcohol, tobacco or illicit drugs  Current medications and supplements including opioid prescriptions. Patient is not currently taking opioid prescriptions. Functional ability and status Nutritional status Physical activity Advanced directives List of other physicians Hospitalizations, surgeries, and ER visits in previous 12 months Vitals Screenings to include cognitive, depression, and falls Referrals and appointments  In addition, I have reviewed and discussed with patient certain preventive protocols, quality metrics, and best practice recommendations. A written personalized care  plan for preventive services as well as general preventive health recommendations were provided to patient.     Verma Gobble, NP   11/12/2023

## 2023-11-12 NOTE — Telephone Encounter (Signed)
 Copied from CRM [id number]. Topic: General - Other >> Gwenyth Dingee 29, 2025  9:07 AM Shelby Dessert H wrote: Reason for CRM: Patient wants the provider to know that she seen a orthopedic surgeon was states she needs a hip replacement right away,he is going to need clearance from provider Dr. Jann Melody. Dr. Ernesta Heading is the provider she seen.

## 2023-11-12 NOTE — Patient Instructions (Signed)
  Wendy Murray , Thank you for taking time to come for your Medicare Wellness Visit. I appreciate your ongoing commitment to your health goals. Please review the following plan we discussed and let me know if I can assist you in the future.     This is a list of the screening recommended for you and due dates:  Health Maintenance  Topic Date Due   COVID-19 Vaccine (8 - 2024-25 season) 05/08/2023   Flu Shot  01/15/2024   DEXA scan (bone density measurement)  11/10/2024   Medicare Annual Wellness Visit  11/11/2024   DTaP/Tdap/Td vaccine (3 - Td or Tdap) 05/02/2031   Pneumonia Vaccine  Completed   Zoster (Shingles) Vaccine  Completed   HPV Vaccine  Aged Out   Meningitis B Vaccine  Aged Out

## 2023-11-12 NOTE — Telephone Encounter (Signed)
 Called and spoke with patient and scheduled an appointment with Dr. Jann Melody for 11/20/23 for Surgery Clearance.

## 2023-11-13 ENCOUNTER — Encounter: Payer: Self-pay | Admitting: Student

## 2023-11-13 ENCOUNTER — Telehealth: Payer: Self-pay | Admitting: *Deleted

## 2023-11-13 ENCOUNTER — Ambulatory Visit: Payer: Self-pay | Admitting: Student

## 2023-11-13 ENCOUNTER — Encounter: Payer: Self-pay | Admitting: Orthopedic Surgery

## 2023-11-13 ENCOUNTER — Ambulatory Visit: Admitting: Student

## 2023-11-13 VITALS — BP 128/76 | HR 86 | Temp 98.3°F | Ht 65.0 in | Wt 161.0 lb

## 2023-11-13 DIAGNOSIS — I5032 Chronic diastolic (congestive) heart failure: Secondary | ICD-10-CM | POA: Diagnosis not present

## 2023-11-13 DIAGNOSIS — Z7189 Other specified counseling: Secondary | ICD-10-CM

## 2023-11-13 DIAGNOSIS — G4700 Insomnia, unspecified: Secondary | ICD-10-CM

## 2023-11-13 DIAGNOSIS — F3341 Major depressive disorder, recurrent, in partial remission: Secondary | ICD-10-CM

## 2023-11-13 DIAGNOSIS — Z79899 Other long term (current) drug therapy: Secondary | ICD-10-CM | POA: Diagnosis not present

## 2023-11-13 MED ORDER — VENLAFAXINE HCL ER 150 MG PO CP24: 150.0000 mg | ORAL_CAPSULE | Freq: Every day | ORAL | Status: DC

## 2023-11-13 MED ORDER — CLONAZEPAM 0.5 MG PO TABS: 0.2500 mg | ORAL_TABLET | Freq: Every day | ORAL | 0 refills | Status: DC

## 2023-11-13 NOTE — Patient Instructions (Addendum)
 __________________________________________ Preventing postoperative complications 1. Pain - increases your chances of confusion and other problems. Make sure you report pain levels and  ask for medication when you need it. 2. Constipation - medications given around surgery make you constipated. Ask for and use a laxative  regularly. 3. Minimizing lines and tubes - ask to get your bladder catheter out as soon as possible. Ask whether  you still need the heart monitor or oxygen tubes daily. 4. Get up and out of bed as soon as possible. Take regular walks around the unit with assistance. 5. Don't reverse your days and nights - minimize daytime napping, keep window shades open, keep active during the day. Minimize noise and interruptions at night.  6. Bring your glasses, hearing aids, or walking aids with you to the hospital. ____________________________________________  VISIT SUMMARY:  You came in today for surgical clearance for your upcoming total right hip replacement. We discussed your recent neck surgery, your current medications, and your overall health and well-being. We also talked about your feelings of loneliness and your efforts to stay socially active.  YOUR PLAN:  -TOTAL RIGHT HIP REPLACEMENT: You are preparing for a total right hip replacement. Your recent neck surgery is important to consider for anesthesia. Your EKG is normal, and you are doing well with physical therapy and your daily exercises. Continue with your physical therapy, daily exercises, and make sure you are eating enough protein. We will also order an EKG and ensure you are medically cleared for surgery.  -CHRONIC USE OF CLONAZEPAM : You have been taking clonazepam  for 30 years to help you sleep. There is a concern about the risk of delirium after surgery due to this medication. We will gradually reduce your dose to 0.25 mg to help reduce this risk. You will take 0.5 mg tablets cut in half, and we will send the  prescription to [facility] at [facility] and [address]. We may consider further dose reduction or alternate day dosing after the initial reduction.  -DEPRESSION: You have been feeling lonely, especially after the loss of your spouse, but you do not have significant symptoms of depression. You are participating in social activities, which is great. Continue to engage in these activities and consider seeking support from friends or family after your surgery to help prevent any disorientation or confusion.  -GOALS OF CARE: You have a Do Not Resuscitate (DNR) order from a previous physician and would like a new one under your current primary care. We will prepare a new DNR form and make sure it is accessible for emergency personnel.  INSTRUCTIONS:  We will order an EKG and ensure you are medically cleared for your total right hip replacement surgery. Continue with your physical therapy and daily exercises, and make sure you are eating enough protein. Reduce your clonazepam  dose to 0.25 mg by cutting your 0.5 mg tablets in half. We will send the prescription to [facility] at [facility] and [address] [address]. Continue participating in social activities and consider seeking support from friends or family after your surgery. We will prepare a new DNR form for you and make sure it is accessible for emergency personnel.

## 2023-11-13 NOTE — Progress Notes (Signed)
 [facility]: [facility] Provider: Jann Melody  Code Status: DNR Goals of Care:     11/13/2023    8:48 AM  Advanced Directives  Does Patient Have a Medical Advance Directive? Yes  Type of Estate agent of [place];Out of facility DNR (pink MOST or yellow form);Living will  Does patient want to make changes to medical advance directive? No - Patient declined  Copy of Healthcare Power of Attorney in Chart? No - copy requested     Chief Complaint  Patient presents with   Surgery Clearance    Surgery Clearance. Right Hip Replacement, Dr. Sharol Decamp    HPI: Patient is a 88 y.o. female seen today for medical management of chronic diseases.   Discussed the use of AI scribe software for clinical note transcription with the patient, who gave verbal consent to proceed.  History of Present Illness   Wendy Murray "Wendy Murray" is an 88 year old female who presents for surgical clearance for a total right hip replacement.  She is preparing for a total right hip replacement and has a history of neck surgery less than five months ago, which may influence her response to anesthesia. She is currently engaged in physical therapy, performing a series of seven exercises once a day.  She has been taking clonazepam  at a dose of 0.5 mg at night for thirty years. She acknowledges that sometimes she can fall asleep without it. She sometimes has trouble staying asleep but does not feel tired or have little energy. Her weight is stable, and she consumes plenty of protein. She has a good appetite and does not overeat.  In terms of her mood, she feels lonely at times, especially when alone, but denies feeling down, depressed, or hopeless. She wants to have more friends and is trying to branch out socially.  No shortness of breath, cough, or asthma.       Past Medical History:  Diagnosis Date   Anxiety    CHF (congestive heart failure) (HCC) 2022   diastolic    COVID-19 05/30/2021   Diverticulosis    DVT (deep venous thrombosis) (HCC) 06/16/2010   after TKR   Dyspnea    Hyperlipidemia    Hyperparathyroidism (HCC)    Hypertension    Hypothyroidism    IBS (irritable bowel syndrome)    Inflammatory arthritis    Osteoarthritis, knee    Recurrent major depression (HCC)    TIA (transient ischemic attack) 01/2021    Past Surgical History:  Procedure Laterality Date   ABDOMINAL HYSTERECTOMY  06/16/1989   CATARACT EXTRACTION W/ INTRAOCULAR LENS  IMPLANT, BILATERAL  06/16/2012   CHOLECYSTECTOMY  06/16/1996   COLONOSCOPY     DEXA  08/15/2007   Normal   HAMMER TOE SURGERY Left 10/15/2014   Dr Meriam Stamp   LUMBAR FUSION  06/17/1991   MENISCUS REPAIR Left 04/16/2010   PARATHYROIDECTOMY Left 06/25/2023   Procedure: NECK EXPLORATION WITH LEFT SUPERIOR PARATHYROIDECTOMY;  Surgeon: Oralee Billow, MD;  Location: [facility];  Service: General;  Laterality: Left;   PARATHYROIDECTOMY  06/25/2023   TOTAL KNEE ARTHROPLASTY Left 06/16/2010   TOTAL KNEE ARTHROPLASTY Right 07/09/2021   Procedure: TOTAL KNEE ARTHROPLASTY;  Surgeon: Elner Hahn, MD;  Location: [facility];  Service: Orthopedics;  Laterality: Right;   WRIST SURGERY Bilateral     No Known Allergies  Outpatient Encounter Medications as of 11/13/2023  Medication Sig   albuterol  (VENTOLIN  HFA) 108 (90 Base) MCG/ACT inhaler Inhale  2 puffs into the lungs every 6 (six) hours as needed for wheezing or shortness of breath.   amoxicillin  (AMOXIL ) 500 MG tablet Take 4 tablets (2,000 mg total) by mouth daily. 1 hour before dental appointment   aspirin -acetaminophen -caffeine (EXCEDRIN MIGRAINE) 250-250-65 MG tablet Take 1 tablet by mouth every 6 (six) hours as needed for headache.   atorvastatin  (LIPITOR) 40 MG tablet Take 1 tablet (40 mg total) by mouth every evening.   cholecalciferol  (VITAMIN D3) 25 MCG (1000 UNIT) tablet Take 1,000 Units by mouth daily.   ferrous sulfate  325 (65 FE) MG tablet Take 325 mg by  mouth daily with breakfast.   folic acid (FOLVITE) 400 MCG tablet Take 400 mcg by mouth daily.   hydroxychloroquine  (PLAQUENIL ) 200 MG tablet Take 200 mg by mouth 2 (two) times daily.   levothyroxine  (SYNTHROID ) 100 MCG tablet Take 1 tablet (100 mcg total) by mouth daily.   mirtazapine  (REMERON ) 15 MG tablet Take 1 tablet (15 mg total) by mouth at bedtime.   Multiple Vitamins-Minerals (IMMUNE SUPPORT PO) Take 1 tablet by mouth daily. With Zinc   Multiple Vitamins-Minerals (MULTIVITAMIN WITH MINERALS) tablet Take 2 tablets by mouth daily. Gummy   omeprazole  (PRILOSEC) 20 MG capsule Take 1 capsule (20 mg total) by mouth every evening.   Probiotic Product (ALIGN) 4 MG CAPS Take 4 mg by mouth at bedtime.   valsartan  (DIOVAN ) 160 MG tablet Take 1 tablet (160 mg total) by mouth daily.   venlafaxine  XR (EFFEXOR -XR) 150 MG 24 hr capsule TAKE 1 CAPSULE BY MOUTH EVERY DAY WITH 75 MG CAPSULE (Patient taking differently: Take 150 mg by mouth daily with breakfast.)   [DISCONTINUED] clonazePAM  (KLONOPIN ) 1 MG tablet Take 0.5 tablets (0.5 mg total) by mouth at bedtime.   clonazePAM  (KLONOPIN ) 0.5 MG tablet Take 0.5 tablets (0.25 mg total) by mouth at bedtime for 20 days.   No facility-administered encounter medications on file as of 11/13/2023.    Review of Systems:  Review of Systems  Health Maintenance  Topic Date Due   INFLUENZA VACCINE  01/15/2024   COVID-19 Vaccine (9 - Moderna risk 2024-25 season) 04/24/2024   DEXA SCAN  11/10/2024   Medicare Annual Wellness (AWV)  11/11/2024   DTaP/Tdap/Td (3 - Td or Tdap) 05/02/2031   Pneumonia Vaccine 59+ Years old  Completed   Zoster Vaccines- Shingrix  Completed   HPV VACCINES  Aged Out   Meningococcal B Vaccine  Aged Out    Physical Exam: Vitals:   11/13/23 0845  BP: 128/76  Pulse: 86  Temp: 98.3 F (36.8 C)  SpO2: 95%  Weight: 161 lb (73 kg)  Height: 5\' 5"  (1.651 m)   Body mass index is 26.79 kg/m. Physical Exam Constitutional:       Appearance: Normal appearance.  Cardiovascular:     Rate and Rhythm: Normal rate and regular rhythm.     Pulses: Normal pulses.     Heart sounds: Normal heart sounds.  Pulmonary:     Effort: Pulmonary effort is normal.  Abdominal:     General: Abdomen is flat. Bowel sounds are normal.     Palpations: Abdomen is soft.  Musculoskeletal:        General: No swelling or tenderness.  Skin:    General: Skin is warm and dry.  Neurological:     Mental Status: She is alert and oriented to person, place, and time.     Gait: Gait abnormal.  Psychiatric:        Mood  and Affect: Mood normal.    Physical Exam   VITALS: P- 83 CARDIOVASCULAR: Normal sinus rhythm, heart rate 83 bpm.      Labs reviewed: Basic Metabolic Panel: Recent Labs    03/10/23 1318 06/16/23 1327 06/26/23 0449 07/13/23 0804 09/07/23 0740  NA 141 137  --  140  --   K 4.7 4.4  --  3.9  --   CL 104 105  --  105  --   CO2 29 27  --  27  --   GLUCOSE 110* 105*  --  92  --   BUN 18 16  --  14  --   CREATININE 0.91 0.75  --  0.69  --   CALCIUM  10.7* 10.2 8.7* 8.9  8.9  --   PHOS 3.4  --   --   --   --   TSH 2.13  --   --  7.11* 3.42   Liver Function Tests: Recent Labs    03/10/23 1318 07/13/23 0804  AST  --  18  ALT  --  19  BILITOT  --  0.4  PROT  --  6.4  ALBUMIN 4.1  --    No results for input(s): "LIPASE", "AMYLASE" in the last 8760 hours. No results for input(s): "AMMONIA" in the last 8760 hours. CBC: Recent Labs    06/16/23 1327 07/13/23 0804  WBC 7.1 9.1  NEUTROABS  --  5,788  HGB 12.6 12.4  HCT 39.4 39.0  MCV 87.9 85.3  PLT 381 599*   Lipid Panel: No results for input(s): "CHOL", "HDL", "LDLCALC", "TRIG", "CHOLHDL", "LDLDIRECT" in the last 8760 hours. Lab Results  Component Value Date   HGBA1C 6.1 (H) 01/23/2021    Procedures since last visit: MR HIP RIGHT W WO CONTRAST Result Date: 10/17/2023 CLINICAL DATA:  Chronic right hip pain, worse over the past 10 days. No known injury. EXAM:  MRI OF THE RIGHT HIP WITHOUT AND WITH CONTRAST TECHNIQUE: Multiplanar, multisequence MR imaging was performed both before and after administration of intravenous contrast. CONTRAST:  7 mL GADAVIST  GADOBUTROL  1 MMOL/ML IV SOLN COMPARISON:  None Available. FINDINGS: Bones: No fracture, stress change or focal lesion is identified. No avascular necrosis of the femoral heads. There is mild subchondral edema about the right hip and prominent osteophytosis about the head neck junction of the right hip. Articular cartilage and labrum Articular cartilage:  Marked joint space narrowing is present. Labrum: There is degenerative tearing of the right labrum, worst anteriorly. Joint or bursal effusion Joint effusion:  Small right hip effusion. Bursae: Negative. Muscles and tendons Muscles and tendons:  Appear intact. Other findings Miscellaneous: Sigmoid diverticulosis noted. The patient is status post hysterectomy. IMPRESSION: 1. No acute finding. 2. Severe right hip osteoarthritis. 3. Sigmoid diverticulosis. Electronically Signed   By: Etheleen Her M.D.   On: 10/17/2023 10:59   DG HIP UNILAT WITH PELVIS 2-3 VIEWS RIGHT Result Date: 10/16/2023 CLINICAL DATA:  Right hip pain EXAM: DG HIP (WITH OR WITHOUT PELVIS) 2-3V RIGHT COMPARISON:  None Available. FINDINGS: A subtle hyperdensity traverses the right femoral neck. This may represent a nondisplaced fracture. Asymmetric degenerative changes are also present in the right hip. No other fractures are present. Postoperative changes are present in the lower lumbar spine. Vascular calcifications are present. IMPRESSION: 1. Subtle hyperdensity traverses the right femoral neck. This may represent a nondisplaced fracture. MRI of the hip would be useful for further evaluation as clinically indicated. 2. Asymmetric degenerative changes in the  right hip. These results will be called to the ordering clinician or representative by the Radiologist Assistant, and communication documented  in the PACS or [employer]. Electronically Signed   By: Audree Leas M.D.   On: 10/16/2023 11:09   Results   DIAGNOSTIC EKG: Normal sinus rhythm, heart rate 83, QTc 396 (11/13/2023)      Assessment/Plan    Total right hip replacement planned She is preparing for a total right hip replacement and requires medical clearance. Recent neck surgery less than five months ago is significant for anesthesia response. No dyspnea, cough, or asthma reported. Weight is well-managed with adequate protein intake. Engaged in physical therapy and daily exercises. EKG shows normal sinus rhythm with a heart rate of 83 and QTc of 396, within normal limits. Mirtazapine  and Effexor  impact QTc, but current EKG is satisfactory. - Order EKG - Ensure medical clearance for surgery - Continue physical therapy and daily exercises - Ensure adequate protein intake  Thyroid  surgery Recent neck surgery less than five months ago is relevant for anesthesia response during upcoming hip replacement surgery.  Chronic use of clonazepam  Chronic use of clonazepam  for 30 years, currently taking 0.5 mg at night. Concerns about increased risk of delirium post-surgery due to anticholinergic effects. She is willing to reduce dosage to mitigate postoperative complications. Plan to gradually reduce dose to 0.25 mg and consider alternate day dosing. - Reduce clonazepam  dose to 0.25 mg - Prescribe 0.5 mg clonazepam  to be cut in half - Send prescription to [facility] at [address] - Consider further dose reduction or alternate day dosing after initial reduction  Depression She reports feeling lonely and down due to lack of social interaction, especially after the loss of spouse. Engages in social activities like a Friday night supper and a music lovers group. No significant symptoms of depression such as anhedonia, hopelessness, or low energy. No issues with appetite or self-esteem. Occasional insomnia, but no  significant impact on energy levels. - Encourage participation in social activities - Consider support from friends or family postoperatively to prevent disorientation and confusion  Goals of Care She has a DNR order from a previous physician and requests a new one under current primary care. Discussed the importance of having a DNR form accessible for emergency situations. - Prepare a new DNR form under current primary care - Ensure DNR form is accessible for emergency personnel      Labs/tests ordered:  * No order type specified * Next appt:  11/13/2023   I spent greater than 40 minutes for the care of this patient in face to face time, chart review, clinical documentation, patient education.   Goal of discontinuing clonazepam  prior to surgery. Dose reduction to 0.25mg .

## 2023-11-13 NOTE — Telephone Encounter (Signed)
 Patient called and stated that she talked with you today at Office Visit that she is donating her body to [facility] when she passes.   She stated that she did not have the contact number when she spoke with you so she is calling to give that to you:  Wendy Murray  [phone]

## 2023-11-17 DIAGNOSIS — M6281 Muscle weakness (generalized): Secondary | ICD-10-CM | POA: Diagnosis not present

## 2023-11-17 DIAGNOSIS — M25551 Pain in right hip: Secondary | ICD-10-CM | POA: Diagnosis not present

## 2023-11-17 DIAGNOSIS — R2689 Other abnormalities of gait and mobility: Secondary | ICD-10-CM | POA: Diagnosis not present

## 2023-11-17 DIAGNOSIS — R296 Repeated falls: Secondary | ICD-10-CM | POA: Diagnosis not present

## 2023-11-17 DIAGNOSIS — R278 Other lack of coordination: Secondary | ICD-10-CM | POA: Diagnosis not present

## 2023-11-18 NOTE — Telephone Encounter (Signed)
 Received Surgery Clearance from [facility] #[phone] Fax: [phone] for Right Total Hip Arthroplasty with Dr. Ernesta Heading.   Surgery Clearance Form filled out and faxed back to [facility]

## 2023-11-19 ENCOUNTER — Telehealth: Payer: Self-pay | Admitting: Orthopedic Surgery

## 2023-11-19 NOTE — Telephone Encounter (Signed)
 Patient called says she has clearance to do the R hip surgery.

## 2023-11-20 ENCOUNTER — Encounter: Admitting: Student

## 2023-11-20 DIAGNOSIS — M25551 Pain in right hip: Secondary | ICD-10-CM | POA: Diagnosis not present

## 2023-11-20 DIAGNOSIS — M6281 Muscle weakness (generalized): Secondary | ICD-10-CM | POA: Diagnosis not present

## 2023-11-20 DIAGNOSIS — R296 Repeated falls: Secondary | ICD-10-CM | POA: Diagnosis not present

## 2023-11-20 DIAGNOSIS — R2689 Other abnormalities of gait and mobility: Secondary | ICD-10-CM | POA: Diagnosis not present

## 2023-11-20 DIAGNOSIS — R278 Other lack of coordination: Secondary | ICD-10-CM | POA: Diagnosis not present

## 2023-11-23 DIAGNOSIS — M6281 Muscle weakness (generalized): Secondary | ICD-10-CM | POA: Diagnosis not present

## 2023-11-23 DIAGNOSIS — R2689 Other abnormalities of gait and mobility: Secondary | ICD-10-CM | POA: Diagnosis not present

## 2023-11-23 DIAGNOSIS — M25551 Pain in right hip: Secondary | ICD-10-CM | POA: Diagnosis not present

## 2023-11-23 DIAGNOSIS — R278 Other lack of coordination: Secondary | ICD-10-CM | POA: Diagnosis not present

## 2023-11-23 DIAGNOSIS — R296 Repeated falls: Secondary | ICD-10-CM | POA: Diagnosis not present

## 2023-11-24 ENCOUNTER — Other Ambulatory Visit: Payer: Self-pay | Admitting: Internal Medicine

## 2023-11-25 ENCOUNTER — Ambulatory Visit: Payer: Self-pay | Admitting: Orthopedic Surgery

## 2023-11-25 ENCOUNTER — Other Ambulatory Visit: Payer: Self-pay

## 2023-11-25 DIAGNOSIS — Z01818 Encounter for other preprocedural examination: Secondary | ICD-10-CM

## 2023-11-25 DIAGNOSIS — M1611 Unilateral primary osteoarthritis, right hip: Secondary | ICD-10-CM

## 2023-11-27 DIAGNOSIS — M6281 Muscle weakness (generalized): Secondary | ICD-10-CM | POA: Diagnosis not present

## 2023-11-27 DIAGNOSIS — R278 Other lack of coordination: Secondary | ICD-10-CM | POA: Diagnosis not present

## 2023-11-27 DIAGNOSIS — R2689 Other abnormalities of gait and mobility: Secondary | ICD-10-CM | POA: Diagnosis not present

## 2023-11-27 DIAGNOSIS — M25551 Pain in right hip: Secondary | ICD-10-CM | POA: Diagnosis not present

## 2023-11-27 DIAGNOSIS — R296 Repeated falls: Secondary | ICD-10-CM | POA: Diagnosis not present

## 2023-11-30 ENCOUNTER — Telehealth: Payer: Self-pay | Admitting: Radiology

## 2023-11-30 NOTE — Telephone Encounter (Signed)
 Felicitas Horse with Operative Services called, LMVM advising [facility] is not on the IP list.  You have this patient scheduled as surgery- admit, they need it to change to OP- in bed, unless there is a reason patient needs to be admitted.  They would rather start with OP- in bed, re evaluate then change to admit.  Please call Tarri Farm with questions. [phone].  Let me know if I can help with anything.

## 2023-12-01 ENCOUNTER — Telehealth: Payer: Self-pay | Admitting: Orthopedic Surgery

## 2023-12-01 NOTE — Telephone Encounter (Signed)
 Dr. Ernesta Heading pt - pt lvm stating that she's left several messages and she's having trouble getting an answer.  She is scheduled for surgery 6/25.  She wants to know how long to expect to stay in the hospital, she is trying to make arrangements.  She would like to stay two nights, but she wants to know what his policy is.  [phone]

## 2023-12-02 ENCOUNTER — Other Ambulatory Visit: Payer: Self-pay

## 2023-12-02 ENCOUNTER — Encounter
Admission: RE | Admit: 2023-12-02 | Discharge: 2023-12-02 | Disposition: A | Discharge status: Home / Self Care | Source: Ambulatory Visit | Attending: Orthopedic Surgery | Admitting: Orthopedic Surgery

## 2023-12-02 VITALS — BP 151/81 | HR 95 | Temp 98.8°F | Resp 18 | Ht 65.0 in | Wt 161.1 lb

## 2023-12-02 DIAGNOSIS — Z22322 Carrier or suspected carrier of Methicillin resistant Staphylococcus aureus: Secondary | ICD-10-CM

## 2023-12-02 DIAGNOSIS — M169 Osteoarthritis of hip, unspecified: Secondary | ICD-10-CM | POA: Insufficient documentation

## 2023-12-02 DIAGNOSIS — I10 Essential (primary) hypertension: Secondary | ICD-10-CM | POA: Insufficient documentation

## 2023-12-02 DIAGNOSIS — Z01812 Encounter for preprocedural laboratory examination: Secondary | ICD-10-CM | POA: Insufficient documentation

## 2023-12-02 DIAGNOSIS — M1611 Unilateral primary osteoarthritis, right hip: Secondary | ICD-10-CM

## 2023-12-02 DIAGNOSIS — Z01818 Encounter for other preprocedural examination: Secondary | ICD-10-CM

## 2023-12-02 HISTORY — DX: Carrier or suspected carrier of methicillin resistant Staphylococcus aureus: Z22.322

## 2023-12-02 LAB — TYPE AND SCREEN
ABO/RH(D): O POS
Antibody Screen: NEGATIVE

## 2023-12-02 LAB — URINALYSIS, COMPLETE (UACMP) WITH MICROSCOPIC
Bacteria, UA: NONE SEEN
Bilirubin Urine: NEGATIVE
Glucose, UA: NEGATIVE mg/dL
Hgb urine dipstick: NEGATIVE
Ketones, ur: NEGATIVE mg/dL
Nitrite: NEGATIVE
Protein, ur: NEGATIVE mg/dL
Specific Gravity, Urine: 1.018 (ref 1.005–1.030)
pH: 5 (ref 5.0–8.0)

## 2023-12-02 LAB — BASIC METABOLIC PANEL WITH GFR
Anion gap: 10 (ref 5–15)
BUN: 14 mg/dL (ref 8–23)
CO2: 24 mmol/L (ref 22–32)
Calcium: 8.8 mg/dL — ABNORMAL LOW (ref 8.9–10.3)
Chloride: 105 mmol/L (ref 98–111)
Creatinine, Ser: 0.58 mg/dL (ref 0.44–1.00)
GFR, Estimated: >60 mL/min (ref >60)
Glucose, Bld: 106 mg/dL — ABNORMAL HIGH (ref 70–99)
Potassium: 3.8 mmol/L (ref 3.5–5.1)
Sodium: 139 mmol/L (ref 135–145)

## 2023-12-02 LAB — CBC
HCT: 38 % (ref 36.0–46.0)
Hemoglobin: 12.1 g/dL (ref 12.0–15.0)
MCH: 26.9 pg (ref 26.0–34.0)
MCHC: 31.8 g/dL (ref 30.0–36.0)
MCV: 84.6 fL (ref 80.0–100.0)
Platelets: 386 10*3/uL (ref 150–400)
RBC: 4.49 MIL/uL (ref 3.87–5.11)
RDW: 14.4 % (ref 11.5–15.5)
WBC: 8.6 10*3/uL (ref 4.0–10.5)
nRBC: 0 % (ref 0.0–0.2)

## 2023-12-02 LAB — SURGICAL PCR SCREEN
MRSA, PCR: POSITIVE — AB
Staphylococcus aureus: POSITIVE — AB

## 2023-12-02 MED ORDER — MUPIROCIN 2 % EX OINT: TOPICAL_OINTMENT | CUTANEOUS | 0 refills | Status: DC

## 2023-12-02 NOTE — Progress Notes (Signed)
  Perioperative Services  Abnormal Lab Notification and Treatment Plan of Care  Date: 12/02/23  Name: Wendy Murray MRN:   [id number]  Re: Abnormal labs noted during PAT appointment  Provider Notified: Sharol Decamp, MD Notification mode: Routed and/or faxed via [facility]  Labs of concern: Lab Results  Component Value Date   STAPHAUREUS POSITIVE (A) 12/02/2023   MRSAPCR POSITIVE (A) 12/02/2023    Notes: Patient is scheduled for a ARTHROPLASTY, HIP, TOTAL, ANTERIOR APPROACH (Right: Hip) on 12/09/2023. She is scheduled to receive CEFAZOLIN  pre-operatively. Pre-surgical PCR (+) for MRSA; see above.  PLANS:  Review renal function. Estimated Creatinine Clearance: 50.5 mL/min (by C-G formula based on SCr of 0.58 mg/dL).  Review allergies. No documented allergy to vancomycin .  Order added for VANCOMYCIN  1 GRAM IV to current preoperative prophylactic regimen.   Patient with orders for both CEFAZOLIN  + VANCOMYCIN  to be given in the setting of documented MRSA (+) surgical PCR.   Guidelines suggest that a beta-lactam antibiotic (first or second generation cephalosporin) should be added for activity against gram-negative organisms.  Vancomycin  appears to be less effective than cefazolin  for preventing SSIs caused by MSSA. For this reason, the use of vancomycin  in combination with cefazolin  is favored for prevention of SSI due to MRSA and coagulase-negative staphylococci.  Medical history in [facility] updated to reflect (+) PCR result indicating nasal MRSA colonization   Rx for additional preoperative nasal decolonization sent to patient's pharmacy of record.   Meds ordered this encounter  Medications   mupirocin ointment (BACTROBAN) 2 %    Sig: Apply small amount to the inside of both nostrils TWICE a day for the next 5 days.    Dispense:  15 g    Refill:  0    Please contact the patient as soon as it is available for pickup. Rx is for preoperative nasal decolonization following (+) MRSA  result on PCR testing. Needs to be started ASAP.   Renate Caroline, MSN, APRN, FNP-C, CEN [facility]  Perioperative Services Nurse Practitioner Phone: [phone] 12/02/23 2:22 PM

## 2023-12-02 NOTE — Telephone Encounter (Signed)
 Called and relayed information to pt. She verbalized understanding.

## 2023-12-02 NOTE — Patient Instructions (Addendum)
 Your procedure is scheduled on:   [facility] JUNE 25  Report to the Registration Desk on the 1st floor of the [facility] To find out your arrival time, please call [phone] between 1PM - 3PM on:  TUESDAY JUNE 24  If your arrival time is 6:00 am, do not arrive before that time as the [facility] entrance doors do not open until 6:00 am.  REMEMBER: Instructions that are not followed completely may result in serious medical risk, up to and including death; or upon the discretion of your surgeon and anesthesiologist your surgery may need to be rescheduled.  Do not eat food after midnight the night before surgery.  No gum chewing or hard candies.  You may however, drink CLEAR liquids up to 3 hours before you are scheduled to arrive for your surgery. Do not drink anything within 3 hours of your scheduled arrival time.  Clear liquids include: - water  - apple juice without pulp - gatorade (not RED colors) - black coffee or tea (Do NOT add milk or creamers to the coffee or tea) Do NOT drink anything that is not on this list.   In addition, your doctor has ordered for you to drink the provided:  Ensure Pre-Surgery Clear Carbohydrate Drink  Drinking this carbohydrate drink up to two hours before surgery helps to reduce insulin resistance and improve patient outcomes. Please complete drinking 2 hours before scheduled arrival time.  One week prior to surgery: [facility] JUNE 18  Stop Anti-inflammatories (NSAIDS) such as Advil , Aleve, Ibuprofen , Motrin , Naproxen, Naprosyn and Aspirin  based products such as Excedrin, Goody's Powder, BC Powder. Stop ANY OVER THE COUNTER supplements until after surgery. cholecalciferol  (VITAMIN D3)  Ferrous Sulfate  Dried (SLOW RELEASE IRON)  folic acid (FOLVITE)  Multiple Vitamins-Minerals  Multiple Vitamins-Minerals (MULTIVITAMIN WITH MINERALS) Gummies Probiotic Product (ALIGN)  You may however, continue to take Tylenol  if needed for pain up until the day  of surgery.  Continue taking all of your other prescription medications up until the day of surgery.  ON THE DAY OF SURGERY ONLY TAKE THESE MEDICATIONS WITH SIPS OF WATER:  venlafaxine  XR (EFFEXOR -XR)   Use inhalers on the day of surgery and bring to the hospital. albuterol  (VENTOLIN  HFA)   No Alcohol for 24 hours before or after surgery.  No Smoking including e-cigarettes for 24 hours before surgery.  No chewable tobacco products for at least 6 hours before surgery.  No nicotine patches on the day of surgery.  Do not use any recreational drugs for at least a week (preferably 2 weeks) before your surgery.  Please be advised that the combination of cocaine and anesthesia may have negative outcomes, up to and including death. If you test positive for cocaine, your surgery will be cancelled.  On the morning of surgery brush your teeth with toothpaste and water, you may rinse your mouth with mouthwash if you wish. Do not swallow any toothpaste or mouthwash.  Use CHG Soap as directed on instruction sheet.  Do not wear jewelry, make-up, hairpins, clips or nail polish.  For welded (permanent) jewelry: bracelets, anklets, waist bands, etc.  Please have this removed prior to surgery.  If it is not removed, there is a chance that hospital personnel will need to cut it off on the day of surgery.  Do not wear lotions, powders, or perfumes.   Do not shave body hair from the neck down 48 hours before surgery.  Contact lenses, hearing aids and dentures may not be worn  into surgery.  Do not bring valuables to the hospital. [facility] is not responsible for any missing/lost belongings or valuables.   Notify your doctor if there is any change in your medical condition (cold, fever, infection).  Wear comfortable clothing (specific to your surgery type) to the hospital.  After surgery, you can help prevent lung complications by doing breathing exercises.  Take deep breaths and cough every  1-2 hours. Your doctor may order a device called an Incentive Spirometer to help you take deep breaths.  If you are being admitted to the hospital overnight, leave your suitcase in the car. After surgery it may be brought to your room.  In case of increased patient census, it may be necessary for you, the patient, to continue your postoperative care in the Same Day Surgery department.  If you are being discharged the day of surgery, you will not be allowed to drive home. You will need a responsible individual to drive you home and stay with you for 24 hours after surgery.   If you are taking public transportation, you will need to have a responsible individual with you.  Please call the Pre-admissions Testing Dept. at [phone] if you have any questions about these instructions.  Surgery Visitation Policy:  Patients having surgery or a procedure may have two visitors.  Children under the age of 36 must have an adult with them who is not the patient.  Inpatient Visitation:    Visiting hours are 7 a.m. to 8 p.m. Up to four visitors are allowed at one time in a patient room. The visitors may rotate out with other people during the day.  One visitor age 3 or older may stay with the patient overnight and must be in the room by 8 p.m.     Pre-operative 5 CHG Bath Instructions   You can play a key role in reducing the risk of infection after surgery. Your skin needs to be as free of germs as possible. You can reduce the number of germs on your skin by washing with CHG (chlorhexidine  gluconate) soap before surgery. CHG is an antiseptic soap that kills germs and continues to kill germs even after washing.   DO NOT use if you have an allergy to chlorhexidine /CHG or antibacterial soaps. If your skin becomes reddened or irritated, stop using the CHG and notify one of our RNs at [phone].   Please shower with the CHG soap starting 4 days before surgery using the following schedule:    STARTING SATURDAY JUNE 21    Please keep in mind the following:  DO NOT shave, including legs and underarms, starting the day of your first shower.   You may shave your face at any point before/day of surgery.  Place clean sheets on your bed the day you start using CHG soap. Use a clean washcloth (not used since being washed) for each shower. DO NOT sleep with pets once you start using the CHG.   CHG Shower Instructions:  If you choose to wash your hair and private area, wash first with your normal shampoo/soap.  After you use shampoo/soap, rinse your hair and body thoroughly to remove shampoo/soap residue.  Turn the water OFF and apply about 3 tablespoons (45 ml) of CHG soap to a CLEAN washcloth.  Apply CHG soap ONLY FROM YOUR NECK DOWN TO YOUR TOES (washing for 3-5 minutes)  DO NOT use CHG soap on face, private areas, open wounds, or sores.  Pay special attention to  the area where your surgery is being performed.  If you are having back surgery, having someone wash your back for you may be helpful. Wait 2 minutes after CHG soap is applied, then you may rinse off the CHG soap.  Pat dry with a clean towel  Put on clean clothes/pajamas   If you choose to wear lotion, please use ONLY the CHG-compatible lotions on the back of this paper.     Additional instructions for the day of surgery: DO NOT APPLY any lotions, deodorants, cologne, or perfumes.   Put on clean/comfortable clothes.  Brush your teeth.  Ask your nurse before applying any prescription medications to the skin.      CHG Compatible Lotions   Aveeno Moisturizing lotion  Cetaphil Moisturizing Cream  Cetaphil Moisturizing Lotion  Clairol Herbal Essence Moisturizing Lotion, Dry Skin  Clairol Herbal Essence Moisturizing Lotion, Extra Dry Skin  Clairol Herbal Essence Moisturizing Lotion, Normal Skin  Curel Age Defying Therapeutic Moisturizing Lotion with Alpha Hydroxy  Curel Extreme Care Body Lotion  Curel Soothing  Hands Moisturizing Hand Lotion  Curel Therapeutic Moisturizing Cream, Fragrance-Free  Curel Therapeutic Moisturizing Lotion, Fragrance-Free  Curel Therapeutic Moisturizing Lotion, Original Formula  Eucerin Daily Replenishing Lotion  Eucerin Dry Skin Therapy Plus Alpha Hydroxy Crme  Eucerin Dry Skin Therapy Plus Alpha Hydroxy Lotion  Eucerin Original Crme  Eucerin Original Lotion  Eucerin Plus Crme Eucerin Plus Lotion  Eucerin TriLipid Replenishing Lotion  Keri Anti-Bacterial Hand Lotion  Keri Deep Conditioning Original Lotion Dry Skin Formula Softly Scented  Keri Deep Conditioning Original Lotion, Fragrance Free Sensitive Skin Formula  Keri Lotion Fast Absorbing Fragrance Free Sensitive Skin Formula  Keri Lotion Fast Absorbing Softly Scented Dry Skin Formula  Keri Original Lotion  Keri Skin Renewal Lotion Keri Silky Smooth Lotion  Keri Silky Smooth Sensitive Skin Lotion  Nivea Body Creamy Conditioning Oil  Nivea Body Extra Enriched Lotion  Nivea Body Original Lotion  Nivea Body Sheer Moisturizing Lotion Nivea Crme  Nivea Skin Firming Lotion  NutraDerm 30 Skin Lotion  NutraDerm Skin Lotion  NutraDerm Therapeutic Skin Cream  NutraDerm Therapeutic Skin Lotion  ProShield Protective Hand Cream  Provon moisturizing lotion   How to Use an Incentive Spirometer  An incentive spirometer is a tool that measures how well you are filling your lungs with each breath. Learning to take long, deep breaths using this tool can help you keep your lungs clear and active. This may help to reverse or lessen your chance of developing breathing (pulmonary) problems, especially infection. You may be asked to use a spirometer: After a surgery. If you have a lung problem or a history of smoking. After a long period of time when you have been unable to move or be active. If the spirometer includes an indicator to show the highest number that you have reached, your health care provider or respiratory  therapist will help you set a goal. Keep a log of your progress as told by your health care provider. What are the risks? Breathing too quickly may cause dizziness or cause you to pass out. Take your time so you do not get dizzy or light-headed. If you are in pain, you may need to take pain medicine before doing incentive spirometry. It is harder to take a deep breath if you are having pain. How to use your incentive spirometer  Sit up on the edge of your bed or on a chair. Hold the incentive spirometer so that it is in an upright position.  Before you use the spirometer, breathe out normally. Place the mouthpiece in your mouth. Make sure your lips are closed tightly around it. Breathe in slowly and as deeply as you can through your mouth, causing the piston or the ball to rise toward the top of the chamber. Hold your breath for 3-5 seconds, or for as long as possible. If the spirometer includes a coach indicator, use this to guide you in breathing. Slow down your breathing if the indicator goes above the marked areas. Remove the mouthpiece from your mouth and breathe out normally. The piston or ball will return to the bottom of the chamber. Rest for a few seconds, then repeat the steps 10 or more times. Take your time and take a few normal breaths between deep breaths so that you do not get dizzy or light-headed. Do this every 1-2 hours when you are awake. If the spirometer includes a goal marker to show the highest number you have reached (best effort), use this as a goal to work toward during each repetition. After each set of 10 deep breaths, cough a few times. This will help to make sure that your lungs are clear. If you have an incision on your chest or abdomen from surgery, place a pillow or a rolled-up towel firmly against the incision when you cough. This can help to reduce pain while taking deep breaths and coughing. General tips When you are able to get out of bed: Walk around  often. Continue to take deep breaths and cough in order to clear your lungs. Keep using the incentive spirometer until your health care provider says it is okay to stop using it. If you have been in the hospital, you may be told to keep using the spirometer at home. Contact a health care provider if: You are having difficulty using the spirometer. You have trouble using the spirometer as often as instructed. Your pain medicine is not giving enough relief for you to use the spirometer as told. You have a fever. Get help right away if: You develop shortness of breath. You develop a cough with bloody mucus from the lungs. You have fluid or blood coming from an incision site after you cough. Summary An incentive spirometer is a tool that can help you learn to take long, deep breaths to keep your lungs clear and active. You may be asked to use a spirometer after a surgery, if you have a lung problem or a history of smoking, or if you have been inactive for a long period of time. Use your incentive spirometer as instructed every 1-2 hours while you are awake. If you have an incision on your chest or abdomen, place a pillow or a rolled-up towel firmly against your incision when you cough. This will help to reduce pain. Get help right away if you have shortness of breath, you cough up bloody mucus, or blood comes from your incision when you cough. This information is not intended to replace advice given to you by your health care provider. Make sure you discuss any questions you have with your health care provider. Document Revised: 08/22/2019 Document Reviewed: 08/22/2019 Elsevier Patient Education  2023 [employer]           Preoperative Educational Videos for Total Hip, Knee and Shoulder Replacements  To better prepare for surgery, please view our videos that explain the physical activity and discharge planning required to have the best surgical recovery at [facility].  [email or web address]  Questions? Call [phone] or email [email or web address]

## 2023-12-03 LAB — URINE CULTURE: Culture: NO GROWTH

## 2023-12-03 NOTE — Telephone Encounter (Signed)
 Dr. Ernesta Heading pt - Wendy Murray w/Operative Services [phone] called, lvm stating they need to know why she needs to be admitted, it has to be documented.

## 2023-12-03 NOTE — Telephone Encounter (Signed)
 Sw Valorie and is aware of the change and will change the booking as well.

## 2023-12-07 DIAGNOSIS — Z961 Presence of intraocular lens: Secondary | ICD-10-CM | POA: Diagnosis not present

## 2023-12-07 DIAGNOSIS — D3132 Benign neoplasm of left choroid: Secondary | ICD-10-CM | POA: Diagnosis not present

## 2023-12-07 DIAGNOSIS — H532 Diplopia: Secondary | ICD-10-CM | POA: Diagnosis not present

## 2023-12-07 DIAGNOSIS — H5007 Alternating esotropia with V pattern: Secondary | ICD-10-CM | POA: Diagnosis not present

## 2023-12-07 LAB — HM DIABETES EYE EXAM

## 2023-12-09 ENCOUNTER — Encounter
Admission: RE | Disposition: A | Discharge status: Home / Self Care | Payer: Self-pay | Source: Home / Self Care | Attending: Orthopedic Surgery

## 2023-12-09 ENCOUNTER — Other Ambulatory Visit: Payer: Self-pay

## 2023-12-09 ENCOUNTER — Observation Stay: Payer: Self-pay | Admitting: Urgent Care

## 2023-12-09 ENCOUNTER — Observation Stay
Admission: RE | Admit: 2023-12-09 | Discharge: 2023-12-10 | Disposition: A | Discharge status: Home / Self Care | Attending: Orthopedic Surgery | Admitting: Orthopedic Surgery

## 2023-12-09 ENCOUNTER — Observation Stay

## 2023-12-09 ENCOUNTER — Encounter: Payer: Self-pay | Admitting: Orthopedic Surgery

## 2023-12-09 DIAGNOSIS — I5032 Chronic diastolic (congestive) heart failure: Secondary | ICD-10-CM | POA: Diagnosis not present

## 2023-12-09 DIAGNOSIS — Z96653 Presence of artificial knee joint, bilateral: Secondary | ICD-10-CM | POA: Insufficient documentation

## 2023-12-09 DIAGNOSIS — J45909 Unspecified asthma, uncomplicated: Secondary | ICD-10-CM | POA: Diagnosis not present

## 2023-12-09 DIAGNOSIS — Z7902 Long term (current) use of antithrombotics/antiplatelets: Secondary | ICD-10-CM | POA: Diagnosis not present

## 2023-12-09 DIAGNOSIS — Z8616 Personal history of COVID-19: Secondary | ICD-10-CM | POA: Diagnosis not present

## 2023-12-09 DIAGNOSIS — Z87891 Personal history of nicotine dependence: Secondary | ICD-10-CM | POA: Diagnosis not present

## 2023-12-09 DIAGNOSIS — I509 Heart failure, unspecified: Secondary | ICD-10-CM | POA: Insufficient documentation

## 2023-12-09 DIAGNOSIS — Z96641 Presence of right artificial hip joint: Secondary | ICD-10-CM | POA: Diagnosis not present

## 2023-12-09 DIAGNOSIS — I110 Hypertensive heart disease with heart failure: Secondary | ICD-10-CM | POA: Diagnosis not present

## 2023-12-09 DIAGNOSIS — Z471 Aftercare following joint replacement surgery: Secondary | ICD-10-CM | POA: Diagnosis not present

## 2023-12-09 DIAGNOSIS — M1611 Unilateral primary osteoarthritis, right hip: Principal | ICD-10-CM | POA: Insufficient documentation

## 2023-12-09 DIAGNOSIS — Z79899 Other long term (current) drug therapy: Secondary | ICD-10-CM | POA: Diagnosis not present

## 2023-12-09 DIAGNOSIS — Z7982 Long term (current) use of aspirin: Secondary | ICD-10-CM | POA: Insufficient documentation

## 2023-12-09 DIAGNOSIS — Z86718 Personal history of other venous thrombosis and embolism: Secondary | ICD-10-CM | POA: Diagnosis not present

## 2023-12-09 DIAGNOSIS — E039 Hypothyroidism, unspecified: Secondary | ICD-10-CM | POA: Insufficient documentation

## 2023-12-09 HISTORY — PX: TOTAL HIP ARTHROPLASTY: SHX124

## 2023-12-09 LAB — HEMOGLOBIN AND HEMATOCRIT, BLOOD
HCT: 37.3 % (ref 36.0–46.0)
Hemoglobin: 11.7 g/dL — ABNORMAL LOW (ref 12.0–15.0)

## 2023-12-09 SURGERY — ARTHROPLASTY, HIP, TOTAL, ANTERIOR APPROACH
Anesthesia: Spinal | Site: Hip | Laterality: Right

## 2023-12-09 MED ORDER — ACETAMINOPHEN 500 MG PO TABS
1000.0000 mg | ORAL_TABLET | Freq: Once | ORAL | Status: AC
  Administered 2023-12-09: 1000 mg via ORAL

## 2023-12-09 MED ORDER — KETOROLAC TROMETHAMINE 30 MG/ML IJ SOLN
INTRAMUSCULAR | Status: DC | PRN
  Administered 2023-12-09: 15 mg via INTRAVENOUS

## 2023-12-09 MED ORDER — FENTANYL CITRATE (PF) 100 MCG/2ML IJ SOLN
INTRAMUSCULAR | Status: AC
Start: 2023-12-09 — End: 2023-12-09
  Filled 2023-12-09: qty 2

## 2023-12-09 MED ORDER — OXYCODONE HCL 5 MG/5ML PO SOLN: 5.0000 mg | Freq: Once | ORAL | Status: AC | PRN

## 2023-12-09 MED ORDER — ACETAMINOPHEN 500 MG PO TABS
1000.0000 mg | ORAL_TABLET | Freq: Three times a day (TID) | ORAL | Status: DC
  Administered 2023-12-09 – 2023-12-10 (×3): 1000 mg via ORAL
  Filled 2023-12-09 (×3): qty 2

## 2023-12-09 MED ORDER — OXYCODONE HCL 5 MG PO TABS
ORAL_TABLET | ORAL | Status: AC
  Filled 2023-12-09: qty 1

## 2023-12-09 MED ORDER — VANCOMYCIN HCL IN DEXTROSE 1-5 GM/200ML-% IV SOLN
INTRAVENOUS | Status: AC
Start: 2023-12-09 — End: 2023-12-09
  Filled 2023-12-09: qty 200

## 2023-12-09 MED ORDER — CEFAZOLIN SODIUM-DEXTROSE 2-4 GM/100ML-% IV SOLN
2.0000 g | INTRAVENOUS | Status: AC
  Administered 2023-12-09: 2 g via INTRAVENOUS

## 2023-12-09 MED ORDER — VANCOMYCIN HCL 1000 MG IV SOLR
INTRAVENOUS | Status: AC
  Filled 2023-12-09: qty 20

## 2023-12-09 MED ORDER — CHLORHEXIDINE GLUCONATE 0.12 % MT SOLN
15.0000 mL | Freq: Once | OROMUCOSAL | Status: AC
  Administered 2023-12-09: 15 mL via OROMUCOSAL

## 2023-12-09 MED ORDER — CHLORHEXIDINE GLUCONATE 0.12 % MT SOLN
OROMUCOSAL | Status: AC
  Filled 2023-12-09: qty 15

## 2023-12-09 MED ORDER — IRBESARTAN 150 MG PO TABS
150.0000 mg | ORAL_TABLET | Freq: Every day | ORAL | Status: DC
  Filled 2023-12-09: qty 1

## 2023-12-09 MED ORDER — ASPIRIN 81 MG PO CHEW
81.0000 mg | CHEWABLE_TABLET | Freq: Two times a day (BID) | ORAL | Status: DC
  Administered 2023-12-10: 81 mg via ORAL
  Filled 2023-12-09: qty 1

## 2023-12-09 MED ORDER — VANCOMYCIN HCL 1000 MG IV SOLR
INTRAVENOUS | Status: DC | PRN
  Administered 2023-12-09: 1000 mg via TOPICAL

## 2023-12-09 MED ORDER — OXYCODONE HCL 5 MG PO TABS
5.0000 mg | ORAL_TABLET | Freq: Once | ORAL | Status: AC | PRN
  Administered 2023-12-09: 5 mg via ORAL

## 2023-12-09 MED ORDER — FENTANYL CITRATE (PF) 100 MCG/2ML IJ SOLN
25.0000 ug | INTRAMUSCULAR | Status: DC | PRN
  Administered 2023-12-09 (×2): 50 ug via INTRAVENOUS
  Administered 2023-12-09: 25 ug via INTRAVENOUS
  Administered 2023-12-09: 50 ug via INTRAVENOUS
  Administered 2023-12-09: 25 ug via INTRAVENOUS

## 2023-12-09 MED ORDER — OXYCODONE HCL 5 MG PO TABS
5.0000 mg | ORAL_TABLET | ORAL | Status: DC | PRN
  Administered 2023-12-09 – 2023-12-10 (×4): 5 mg via ORAL
  Filled 2023-12-09 (×4): qty 1

## 2023-12-09 MED ORDER — VANCOMYCIN HCL IN DEXTROSE 1-5 GM/200ML-% IV SOLN
1000.0000 mg | Freq: Once | INTRAVENOUS | Status: AC
  Administered 2023-12-09 (×2): 1000 mg via INTRAVENOUS

## 2023-12-09 MED ORDER — ATORVASTATIN CALCIUM 20 MG PO TABS
ORAL_TABLET | ORAL | Status: AC
Start: 2023-12-09 — End: 2023-12-09
  Filled 2023-12-09: qty 2

## 2023-12-09 MED ORDER — TRANEXAMIC ACID-NACL 1000-0.7 MG/100ML-% IV SOLN
INTRAVENOUS | Status: AC
  Filled 2023-12-09: qty 100

## 2023-12-09 MED ORDER — CELECOXIB 200 MG PO CAPS
ORAL_CAPSULE | ORAL | Status: AC
  Filled 2023-12-09: qty 1

## 2023-12-09 MED ORDER — ONDANSETRON HCL 4 MG/2ML IJ SOLN
INTRAMUSCULAR | Status: DC | PRN
  Administered 2023-12-09: 4 mg via INTRAVENOUS

## 2023-12-09 MED ORDER — MIRTAZAPINE 15 MG PO TABS
15.0000 mg | ORAL_TABLET | Freq: Every day | ORAL | Status: DC
  Administered 2023-12-09: 15 mg via ORAL
  Filled 2023-12-09: qty 1

## 2023-12-09 MED ORDER — DEXAMETHASONE SODIUM PHOSPHATE 10 MG/ML IJ SOLN
INTRAMUSCULAR | Status: DC | PRN
  Administered 2023-12-09: 10 mg via INTRAVENOUS

## 2023-12-09 MED ORDER — HYDRALAZINE HCL 20 MG/ML IJ SOLN
10.0000 mg | Freq: Once | INTRAMUSCULAR | Status: AC
  Administered 2023-12-09: 10 mg via INTRAVENOUS

## 2023-12-09 MED ORDER — ROCURONIUM BROMIDE 100 MG/10ML IV SOLN
INTRAVENOUS | Status: DC | PRN
  Administered 2023-12-09: 50 mg via INTRAVENOUS

## 2023-12-09 MED ORDER — ONDANSETRON HCL 4 MG/2ML IJ SOLN: 4.0000 mg | Freq: Four times a day (QID) | INTRAMUSCULAR | Status: DC | PRN

## 2023-12-09 MED ORDER — DEXMEDETOMIDINE HCL IN NACL 80 MCG/20ML IV SOLN
INTRAVENOUS | Status: DC | PRN
  Administered 2023-12-09: 12 ug via INTRAVENOUS
  Administered 2023-12-09: 8 ug via INTRAVENOUS

## 2023-12-09 MED ORDER — LEVOTHYROXINE SODIUM 25 MCG PO TABS
100.0000 ug | ORAL_TABLET | Freq: Every day | ORAL | Status: DC
  Administered 2023-12-10: 100 ug via ORAL
  Filled 2023-12-09: qty 4

## 2023-12-09 MED ORDER — CEFAZOLIN SODIUM-DEXTROSE 2-4 GM/100ML-% IV SOLN
INTRAVENOUS | Status: AC
  Filled 2023-12-09: qty 100

## 2023-12-09 MED ORDER — KETAMINE HCL 50 MG/5ML IJ SOSY
PREFILLED_SYRINGE | INTRAMUSCULAR | Status: AC
  Filled 2023-12-09: qty 5

## 2023-12-09 MED ORDER — CELECOXIB 100 MG PO CAPS
100.0000 mg | ORAL_CAPSULE | Freq: Every day | ORAL | Status: DC
  Administered 2023-12-09 – 2023-12-10 (×2): 100 mg via ORAL
  Filled 2023-12-09 (×2): qty 1

## 2023-12-09 MED ORDER — SUGAMMADEX SODIUM 500 MG/5ML IV SOLN
INTRAVENOUS | Status: DC | PRN
  Administered 2023-12-09: 200 mg via INTRAVENOUS

## 2023-12-09 MED ORDER — SEVOFLURANE IN SOLN
RESPIRATORY_TRACT | Status: AC
  Filled 2023-12-09: qty 250

## 2023-12-09 MED ORDER — PROPOFOL 10 MG/ML IV BOLUS
INTRAVENOUS | Status: DC | PRN
Start: 2023-12-09 — End: 2023-12-09
  Administered 2023-12-09 (×2): 10 mg via INTRAVENOUS
  Administered 2023-12-09: 100 mg via INTRAVENOUS
  Administered 2023-12-09: 10 mg via INTRAVENOUS
  Administered 2023-12-09: 25 ug/kg/min via INTRAVENOUS

## 2023-12-09 MED ORDER — VENLAFAXINE HCL ER 150 MG PO CP24
150.0000 mg | ORAL_CAPSULE | Freq: Every day | ORAL | Status: DC
  Administered 2023-12-10: 150 mg via ORAL
  Filled 2023-12-09: qty 1

## 2023-12-09 MED ORDER — OXYCODONE HCL 5 MG PO TABS: 10.0000 mg | ORAL_TABLET | ORAL | Status: DC | PRN

## 2023-12-09 MED ORDER — HYDRALAZINE HCL 20 MG/ML IJ SOLN
INTRAMUSCULAR | Status: AC
  Filled 2023-12-09: qty 1

## 2023-12-09 MED ORDER — DEXMEDETOMIDINE HCL IN NACL 200 MCG/50ML IV SOLN
INTRAVENOUS | Status: DC | PRN
Start: 2023-12-09 — End: 2023-12-09
  Administered 2023-12-09: .2 ug/kg/h via INTRAVENOUS

## 2023-12-09 MED ORDER — HYDROMORPHONE HCL 1 MG/ML IJ SOLN
INTRAMUSCULAR | Status: AC
  Filled 2023-12-09: qty 1

## 2023-12-09 MED ORDER — BACITRACIN ZINC 500 UNIT/GM EX OINT
TOPICAL_OINTMENT | CUTANEOUS | Status: AC
  Filled 2023-12-09: qty 28.35

## 2023-12-09 MED ORDER — ONDANSETRON HCL 4 MG PO TABS: 4.0000 mg | ORAL_TABLET | Freq: Four times a day (QID) | ORAL | Status: DC | PRN

## 2023-12-09 MED ORDER — 0.9 % SODIUM CHLORIDE (POUR BTL) OPTIME
TOPICAL | Status: DC | PRN
  Administered 2023-12-09: 500 mL

## 2023-12-09 MED ORDER — KETAMINE HCL 50 MG/5ML IJ SOSY
PREFILLED_SYRINGE | INTRAMUSCULAR | Status: DC | PRN
  Administered 2023-12-09: 10 mg via INTRAVENOUS
  Administered 2023-12-09: 20 mg via INTRAVENOUS

## 2023-12-09 MED ORDER — ATORVASTATIN CALCIUM 10 MG PO TABS
40.0000 mg | ORAL_TABLET | Freq: Every evening | ORAL | Status: DC
  Administered 2023-12-09: 40 mg via ORAL

## 2023-12-09 MED ORDER — PHENYLEPHRINE 80 MCG/ML (10ML) SYRINGE FOR IV PUSH (FOR BLOOD PRESSURE SUPPORT)
PREFILLED_SYRINGE | INTRAVENOUS | Status: DC | PRN
  Administered 2023-12-09: 80 ug via INTRAVENOUS

## 2023-12-09 MED ORDER — BUPIVACAINE-EPINEPHRINE (PF) 0.5% -1:200000 IJ SOLN
INTRAMUSCULAR | Status: AC
Start: 2023-12-09 — End: 2023-12-09
  Filled 2023-12-09: qty 30

## 2023-12-09 MED ORDER — FENTANYL CITRATE (PF) 100 MCG/2ML IJ SOLN
INTRAMUSCULAR | Status: DC | PRN
  Administered 2023-12-09 (×2): 25 ug via INTRAVENOUS
  Administered 2023-12-09: 50 ug via INTRAVENOUS

## 2023-12-09 MED ORDER — PANTOPRAZOLE SODIUM 40 MG PO TBEC
40.0000 mg | DELAYED_RELEASE_TABLET | Freq: Every day | ORAL | Status: DC
  Administered 2023-12-09: 40 mg via ORAL
  Filled 2023-12-09: qty 1

## 2023-12-09 MED ORDER — ALBUTEROL SULFATE (2.5 MG/3ML) 0.083% IN NEBU: 3.0000 mL | INHALATION_SOLUTION | Freq: Four times a day (QID) | RESPIRATORY_TRACT | Status: DC | PRN

## 2023-12-09 MED ORDER — LACTATED RINGERS IV SOLN: INTRAVENOUS | Status: DC

## 2023-12-09 MED ORDER — ACETAMINOPHEN 500 MG PO TABS
ORAL_TABLET | ORAL | Status: AC
Start: 2023-12-09 — End: 2023-12-09
  Filled 2023-12-09: qty 2

## 2023-12-09 MED ORDER — PROPOFOL 1000 MG/100ML IV EMUL
INTRAVENOUS | Status: AC
  Filled 2023-12-09: qty 100

## 2023-12-09 MED ORDER — BUPIVACAINE-EPINEPHRINE 0.5% -1:200000 IJ SOLN
INTRAMUSCULAR | Status: DC | PRN
  Administered 2023-12-09: 30 mL

## 2023-12-09 MED ORDER — SODIUM CHLORIDE 0.9 % IR SOLN
Status: DC | PRN
  Administered 2023-12-09: 3000 mL

## 2023-12-09 MED ORDER — FENTANYL CITRATE (PF) 100 MCG/2ML IJ SOLN
INTRAMUSCULAR | Status: AC
  Filled 2023-12-09: qty 2

## 2023-12-09 MED ORDER — LIDOCAINE HCL (CARDIAC) PF 100 MG/5ML IV SOSY
PREFILLED_SYRINGE | INTRAVENOUS | Status: DC | PRN
  Administered 2023-12-09: 30 mg via INTRAVENOUS

## 2023-12-09 MED ORDER — TRANEXAMIC ACID-NACL 1000-0.7 MG/100ML-% IV SOLN
1000.0000 mg | INTRAVENOUS | Status: AC
  Administered 2023-12-09: 1000 mg via INTRAVENOUS

## 2023-12-09 MED ORDER — NAPHAZOLINE-GLYCERIN 0.012-0.25 % OP SOLN
1.0000 [drp] | Freq: Four times a day (QID) | OPHTHALMIC | Status: DC | PRN
  Administered 2023-12-09: 1 [drp] via OPHTHALMIC
  Administered 2023-12-10: 2 [drp] via OPHTHALMIC
  Filled 2023-12-09 (×2): qty 15

## 2023-12-09 MED ORDER — HYDROMORPHONE HCL 1 MG/ML IJ SOLN
INTRAMUSCULAR | Status: DC | PRN
  Administered 2023-12-09 (×2): .5 mg via INTRAVENOUS
  Administered 2023-12-09: 1 mg via INTRAVENOUS

## 2023-12-09 MED ORDER — ORAL CARE MOUTH RINSE
15.0000 mL | Freq: Once | OROMUCOSAL | Status: AC
Start: 2023-12-09 — End: 2023-12-09

## 2023-12-09 SURGICAL SUPPLY — 39 items
BLADE SAW SAG 25X90X1.19 (BLADE) ×1 IMPLANT
BNDG COHESIVE 6X5 TAN ST LF (GAUZE/BANDAGES/DRESSINGS) ×1 IMPLANT
CHLORAPREP W/TINT 26 (MISCELLANEOUS) ×1 IMPLANT
CUP ACET PINNACLE SECTR 48MM (Joint) IMPLANT
DRAPE C-ARM 42X72 X-RAY (DRAPES) ×1 IMPLANT
DRAPE INCISE IOBAN 66X45 STRL (DRAPES) ×1 IMPLANT
DRAPE U-SHAPE 47X51 STRL (DRAPES) ×3 IMPLANT
DRSG AQUACEL AG ADV 3.5X10 (GAUZE/BANDAGES/DRESSINGS) ×1 IMPLANT
ELECTRODE BLDE 4.0 EZ CLN MEGD (MISCELLANEOUS) ×1 IMPLANT
ELECTRODE REM PT RTRN 9FT ADLT (ELECTROSURGICAL) ×1 IMPLANT
FEMORAL STEM 12/14 TPR SZ4 HIP (Orthopedic Implant) IMPLANT
GLOVE BIO SURGEON STRL SZ8 (GLOVE) ×2 IMPLANT
GLOVE BIOGEL PI IND STRL 8 (GLOVE) ×1 IMPLANT
GOWN STRL REUS W/ TWL LRG LVL3 (GOWN DISPOSABLE) ×2 IMPLANT
GOWN STRL REUS W/ TWL XL LVL3 (GOWN DISPOSABLE) ×2 IMPLANT
HEAD FEM STD 32X+5 STRL (Hips) IMPLANT
HOOD PEEL AWAY T7 (MISCELLANEOUS) ×2 IMPLANT
KIT PATIENT CARE HANA TABLE (KITS) ×1 IMPLANT
KIT TURNOVER CYSTO (KITS) ×1 IMPLANT
MANIFOLD NEPTUNE II (INSTRUMENTS) ×1 IMPLANT
MAT ABSORB FLUID 56X50 GRAY (MISCELLANEOUS) ×1 IMPLANT
NDL SPNL 20GX3.5 QUINCKE YW (NEEDLE) IMPLANT
NEEDLE SPNL 20GX3.5 QUINCKE YW (NEEDLE) ×1 IMPLANT
NS IRRIG 1000ML POUR BTL (IV SOLUTION) ×1 IMPLANT
PACK HIP COMPR (MISCELLANEOUS) ×1 IMPLANT
PENCIL SMOKE EVACUATOR (MISCELLANEOUS) ×1 IMPLANT
PINN ALTRX NEUT ID X OD 32X48 IMPLANT
SOL .9 NS 3000ML IRR UROMATIC (IV SOLUTION) ×1 IMPLANT
STAPLER SKIN PROX 35W (STAPLE) ×1 IMPLANT
SUT MNCRL AB 4-0 PS2 18 (SUTURE) IMPLANT
SUT MON AB 2-0 CT1 36 (SUTURE) ×2 IMPLANT
SUT STRATAFIX SPIRAL PDS+ 0 30 (SUTURE) ×1 IMPLANT
SUT VIC AB 0 CT1 36 (SUTURE) ×1 IMPLANT
SUT VIC AB 1 CT1 36 (SUTURE) ×2 IMPLANT
SUT VIC AB 2-0 CT1 (SUTURE) IMPLANT
SYR 30ML LL (SYRINGE) IMPLANT
TIP FAN IRRIG PULSAVAC PLUS (DISPOSABLE) ×1 IMPLANT
TRAY FOLEY MTR SLVR 16FR STAT (SET/KITS/TRAYS/PACK) IMPLANT
WATER STERILE IRR 1000ML POUR (IV SOLUTION) ×2 IMPLANT

## 2023-12-09 NOTE — Op Note (Signed)
 Orthopaedic Surgery Operative Note (CSN: [id number])  Wendy Murray  02/27/1936 Date of Surgery: 12/09/2023   Diagnoses:  Right hip arthritis  Procedure: Right Total Hip Arthroplasty (CPT 27130)   Operative Finding Successful completion of the planned procedure.  Right total hip arthroplasty, using an anterior approach on a hand table.  Limb lengths were approximately equal.  Hip was stable, as confirmed under fluoroscopy.   Post-Op Diagnosis: Same Surgeons:Primary: Onesimo Oneil LABOR, MD Assistants: India Blumenthal Location: [facility] OR ROOM 02 Anesthesia: General with local anesthesia Antibiotics: Ancef  2 g with local vancomycin  powder 1 g at the surgical site Tourniquet time: N/A Estimated Blood Loss: 250 cc Complications: None Specimens: None  Implants: Implant Name Type Inv. Item Serial No. Manufacturer Lot No. LRB No. Used Action  CUP ACET PINNACLE SECTR - [id number] Joint CUP ACET PINNACLE SECTR  [employer] [id number] Right 1 Implanted  PINN ALTRX NEUT ID X OD [id number] - [id number]  PINN ALTRX NEUT ID X OD 32X48  [employer] [id number] Right 1 Implanted  FEMORAL STEM 12/14 TPR SZ4 HIP - [id number] Orthopedic Implant FEMORAL STEM 12/14 TPR SZ4 HIP  [employer] [id number] Right 1 Implanted  HEAD FEM STD 32X+5 STRL - [id number] Hips HEAD FEM STD 32X+5 [employer] [id number] Right 1 Implanted    Indications for Surgery:   Wendy Murray is a 88 y.o. female who has advanced degenerative changes of theRight hip.  Patient has attempted multiple nonoperative measures.  Radiographs demonstrate advanced degenerative changes, including osteophytes, subchondral sclerosis, and associated cysts.  Nonoperative treatment including over-the-counter NSAIDs, prescription NSAIDs, physical therapy, hip injections and activity modifications have not provided sustained relief.  As result, I have recommended proceeding with total hip arthroplasty.  Benefits and risks of  operative and nonoperative management were discussed prior to surgery with the patient and informed consent form was completed.  Specific risks including infection, need for additional surgery, fracture, dislocation, persistent pain, damage to surrounding structures including nerves and blood vessels, poor integration of the implants, blood clots and more severe complications associated with anesthesia.  All questions have been answered.  They elected proceed with surgery.  Surgical consent was finalized.   Procedure:   The patient was identified properly. Informed consent was obtained and the surgical site was marked. The patient was taken to the OR where a spinal and sedation was induced.  The patient was positioned supine on a Hana table, with both feet in boots.  We confirmed appropriate position using fluoroscopy, prior to draping.  A Foley catheter was placed.  The right hip was prepped and draped in the usual sterile fashion.  Timeout was performed before the beginning of the case.  Patient received 2 g of Ancef  and 1 g of TXA prior to making incision.  The ASI was palpated, and marked.  We used this is our primary landmark.  We made a long contusional incision, just distal to the ASIS, extending distally over the anterior thigh.  We incised sharply through skin, then through subcutaneous tissue.  The fascia overlying the TFL was identified, and cleared.  Hemostasis was achieved.  We palpated the ASIS 1 more time, to confirm that we were indeed lateral, and in line with the TFL.  We then used a knife to incise sharply through the fascia across the extent of the incision.  We used pickups to develop a plane medially.  We then introduced a retractor to retract the underlying muscle laterally.  We are  able to palpate the femoral head, and a Cobra was placed directly over the superior aspect of the femoral neck.  The crossing arteries were identified, cauterized.  We achieved hemostasis.  Fat overlying  the capsule was removed using a rongeurs.  We then able to palpate the inferior aspect of the femoral neck.  An additional Cobra retractor was placed on the inferior aspect of the femoral neck.  We identified the most superior portion of the vastus, and planned out our capsulotomy.  We incised obliquely extending from the inferior aspect of the femoral neck laterally, across the femoral neck to towards the femoral head.  We then completed a horizontal incision at the most superior aspect of the vastus, towards the inferior femoral neck.  This was taken directly down to bone.  We then used a Cobb elevator, and replaced the superior cobra retractor intracapsular.  We then continued with Bovie cautery to release the medial portion of the hip capsule, and placed the inferior cobra intracapsular.  We continued to release the hip capsule superior and inferior so that we are able to palpate the lesser trochanter.  A portion of the capsule was removed in order to improve visibility. Using the superior saddle, as well as the lesser trochanter as guides, we planned out our femoral neck cut.  We completed the femoral neck cut using a saw inferiorly, and completed the cut using osteotome.  We then introduced a corkscrew within the cut portion of the femoral neck.  This was initially on power, then transitioned to hand.  We manipulated the cut femoral neck and head until we are able to achieve full control.  Additional attachments of the capsule were released using Bovie cautery.  Subsequently, we were able to remove the femoral head.  The femoral head was measured on the back table, and determined to be 48 mm.    Next, we made plans to prepare the acetabulum.  A 90 degree bent Hohmann was placed over the anterior rim of the acetabulum.  Using Bovie cautery, we released the transverse ligament inferiorly, as well as an in fold of the remaining capsule.  We then used pituitary rongeurs, to release any remaining labrum  circumferentially.  Bleeding was controlled.  We then released the soft tissue within the acetabular fossa.  Self retainer was placed within the distal extent of the approach.  The femur was then rotated approximately 90 degrees to give greater access to the acetabulum.  We selected a 43 mm reamer to start.  We started by reaming primarily medial, until the excess bone was removed overlying the fossa.  We then continued to increase the size of the reamer, until we inserted a 47 millimeter reamer.  At this point, all retractors were removed.  Fluoroscopy was used to confirm our cup position, and to ensure that we have achieved adequate reaming.  We continued to ream until we had bottomed out.  We had achieved approximately 45 degrees of abduction, and approximately 20 degrees of anteversion.  This was confirmed under fluoroscopy.  The reamer was removed.  Retractors were replaced.  We had good access to the acetabulum.  The acetabulum was irrigated.  We confirmed that all soft tissue had been removed from within the acetabulum.  We selected a 48 mm cup, and this was inserted into the acetabulum, with the screw holes facing superior.  Once we are within the acetabulum, retractors were removed.  Once again fluoroscopy was used to confirm appropriate positioning of the  cup, with approximate 45 degrees of abduction, and approximately 15 degrees of anteversion.  The cup was then impacted under direct fluoroscopic guidance.  It was secured.  As a result, I made the decision not to place any superior screws.  Once again, the acetabulum was irrigated.  We replaced the retractors.  We then selected the above stated poly, and this was impacted into the cup.  We then turned our attention to the femur.  The leg was externally rotated approximately 120 degrees.  We placed a Mueller type retractor on the posterior aspect of the femoral neck cut.  Next, we placed the hook for the bed attachment underneath the femur, with the  retractor extending laterally.  This was held in position manually at this point.  The superior leaf of the capsule was identified.  We then placed a Hohmann retractor between the capsule and the gluteus medius tendon, in order to protect the gluteus tendon.  We proceeded to release the remaining capsule down to the greater trochanter.  We then proceeded to release additional soft tissue, including some of the short external rotators of the posterior aspect of the greater trochanter.  We then released some of the soft tissue attached to the superior aspect of the greater trochanter.  A retractor was then placed underneath the greater trochanter, and held laterally.  The bed attachment was secured within the rotating arm, and the femur was lifted using the bed attachment.  Under direct visualization, the leg was then very carefully extended and a adducted to provide additional exposure of the femoral neck cut.  At this point, we were satisfied with our overall exposure.  We did use a hip retractor superiorly, in order to protect the skin, and the muscle belly further.  A box osteotome was introduced, to gain access to the femoral canal.  We then used a rasp as a canal finder.  We then sequentially reamed until we were able to achieve excellent fit of the femoral canal.  Between each subsequent broach, the canal was sounded to ensure that we do not breached the canal posterior or anterior.  We matched the native version.  We then trialed a neck, and femoral head.  Retractors were removed.  The leg was repositioned, and ultimately reduced.  We confirmed appropriate alignment and fit under fluoroscopy.  We confirmed that there was no posterior cortex breach by externally rotating the leg 90 degrees, and confirming under fluoroscopy.  The femoral head was dislocated again, and retractors were replaced.  The trial implants were removed.  The femoral canal was irrigated copiously.  The above-stated stem was then opened,  and inserted via hand, until we started to experience resistance.  The stem was then impacted within the femoral canal.  We achieved excellent fit.  There was good stability.  A metal ball was then placed, and impacted on the femoral stem.  Retractors were removed, and the hip was reduced once again.  Overall appearance of the hip was confirmed under fluoroscopy.  We were satisfied with our alignment and fit within the femoral canal.  Limb lengths were approximately equal, based on imaging.  The hip was irrigated using Pulsavac irrigation.  We used #1 vicryl to close the remaining portion of the capsule.  Again we irrigated the wound copiously.  We placed vancomycin  within the hip joint.  We then closed the fascia with a running #1 Vicryl.  Skin was closed in a layered fashion with 2-0 Monocryl, and staples.  Incision  was covered with bacitracin, and an Aquacel dressing.  Patient was awoken taken to PACU in stable condition.   Post-operative plan:  The patient will be WBAT on the operative extremity.  No restrictions. Patient will be admitted to the floor for overnight observation. Evaluation by PT/OT DVT prophylaxis Aspirin  81 mg twice daily for 6 weeks.    Pain control with PRN pain medication preferring oral medicines.   Follow up plan will be scheduled in approximately 10-14 days for incision check and XR.

## 2023-12-09 NOTE — Anesthesia Preprocedure Evaluation (Addendum)
 Anesthesia Evaluation  Patient identified by MRN, date of birth, ID band Patient awake    Reviewed: Allergy & Precautions, NPO status , Patient's Chart, lab work & pertinent test results  History of Anesthesia Complications Negative for: history of anesthetic complications  Airway Mallampati: III  TM Distance: <3 FB Neck ROM: full    Dental  (+) Chipped   Pulmonary neg shortness of breath, asthma , former smoker   Pulmonary exam normal        Cardiovascular Exercise Tolerance: Good hypertension, +CHF  Normal cardiovascular exam     Neuro/Psych TIA negative psych ROS   GI/Hepatic Neg liver ROS,GERD  Controlled,,  Endo/Other  Hypothyroidism    Renal/GU      Musculoskeletal   Abdominal   Peds  Hematology negative hematology ROS (+)   Anesthesia Other Findings Past Medical History: No date: Anxiety 2022: Asthmatic bronchitis 2022: CHF (congestive heart failure) (HCC)     Comment:  diastolic 05/30/2021: COVID-19 No date: Diverticulosis 06/16/2010: DVT (deep venous thrombosis) (HCC)     Comment:  after TKR No date: Dyspnea No date: Hyperlipidemia No date: Hyperparathyroidism (HCC)     Comment:  a.) s/p parathyroidectomy No date: Hypertension No date: Hypothyroidism No date: IBS (irritable bowel syndrome) No date: Inflammatory arthritis 05/23/2022: Nodule of left lung 12/02/2023: Nose colonized with MRSA     Comment:  a.) presurgical PCR (+) 12/02/2023 prior to RIGHT THA 05/05/2023: Orthostatic dizziness No date: Osteoarthritis, knee No date: Recurrent major depression (HCC) 07/13/2018: Sensory hearing loss, bilateral 01/2021: TIA (transient ischemic attack)  Past Surgical History: 06/16/1989: ABDOMINAL HYSTERECTOMY 06/16/2012: CATARACT EXTRACTION W/ INTRAOCULAR LENS  IMPLANT,  BILATERAL 06/16/1996: CHOLECYSTECTOMY No date: COLONOSCOPY 08/15/2007: DEXA     Comment:  Normal 10/15/2014: HAMMER TOE  SURGERY; Left     Comment:  Dr Christine 06/17/1991: LUMBAR FUSION 04/16/2010: MENISCUS REPAIR; Left 06/25/2023: PARATHYROIDECTOMY; Left     Comment:  Procedure: NECK EXPLORATION WITH LEFT SUPERIOR               PARATHYROIDECTOMY;  Surgeon: Eletha Boas, MD;  Location:              [facility] OR[facility];  Service: General;  Laterality: Left; 06/25/2023: PARATHYROIDECTOMY 06/16/2010: TOTAL KNEE ARTHROPLASTY; Left 07/09/2021: TOTAL KNEE ARTHROPLASTY; Right     Comment:  Procedure: TOTAL KNEE ARTHROPLASTY;  Surgeon: Edie Norleen PARAS, MD;  Location: [facility] ORS;  Service: Orthopedics;                Laterality: Right; No date: WRIST SURGERY; Bilateral  BMI    Body Mass Index: 26.81 kg/m      Reproductive/Obstetrics negative OB ROS                             Anesthesia Physical Anesthesia Plan  ASA: 3  Anesthesia Plan: Spinal   Post-op Pain Management:    Induction:   PONV Risk Score and Plan:   Airway Management Planned: Natural Airway and Nasal Cannula  Additional Equipment:   Intra-op Plan:   Post-operative Plan:   Informed Consent: I have reviewed the patients History and Physical, chart, labs and discussed the procedure including the risks, benefits and alternatives for the proposed anesthesia with the patient or authorized representative who has indicated his/her understanding and acceptance.     Dental Advisory Given  Plan Discussed with: Anesthesiologist, CRNA and Surgeon  Anesthesia Plan Comments: (Patient reports no bleeding problems and no anticoagulant use.  Patient is not sure if she wants spinal as her anesthetic with her back surgery history. The plan is we will try for a spinal but if she is technically challenging then we will do a GA. Patient assents to this plan.  Plan for spinal with backup GA  Patient consented for risks of anesthesia including but not limited to:  - adverse reactions to medications - damage to eyes, teeth,  lips or other oral mucosa - nerve damage due to positioning  - risk of bleeding, infection and or nerve damage from spinal that could lead to paralysis - risk of headache or failed spinal - damage to teeth, lips or other oral mucosa - sore throat or hoarseness - damage to heart, brain, nerves, lungs, other parts of body or loss of life  Patient voiced understanding and assent.)       Anesthesia Quick Evaluation

## 2023-12-09 NOTE — Interval H&P Note (Signed)
 History and Physical Interval Note:  12/09/2023 7:20 AM  Wendy Murray  has presented today for surgery, with the diagnosis of Right hip arthritis.  The various methods of treatment have been discussed with the patient and family. After consideration of risks, benefits and other options for treatment, the patient has consented to  Procedure(s): ARTHROPLASTY, HIP, TOTAL, ANTERIOR APPROACH (Right) as a surgical intervention.  [name] history has been reviewed, patient examined, no change in status, stable for surgery.  I have reviewed the patient's chart and labs.  Questions were answered to the patient's satisfaction.    Right total hip arthroplasty.  She is ready to proceed.  She is medically cleared.  All questions have been answered.  Plan for over night admission.  Consent has been finalized.    Wendy Murray

## 2023-12-09 NOTE — Anesthesia Procedure Notes (Signed)
 Procedure Name: Intubation Date/Time: 12/09/2023 7:45 AM  Performed by: Germaine Maeola CROME, CRNAPre-anesthesia Checklist: Patient identified, Emergency Drugs available, Suction available and Patient being monitored Patient Re-evaluated:Patient Re-evaluated prior to induction Oxygen Delivery Method: Circle system utilized Preoxygenation: Pre-oxygenation with 100% oxygen Induction Type: IV induction Ventilation: Mask ventilation without difficulty Laryngoscope Size: McGrath and 3 Grade View: Grade I Tube type: Oral Tube size: 7.0 mm Number of attempts: 1 Airway Equipment and Method: Stylet Placement Confirmation: ETT inserted through vocal cords under direct vision, positive ETCO2 and breath sounds checked- equal and bilateral Secured at: 20 cm Tube secured with: Tape Dental Injury: Teeth and Oropharynx as per pre-operative assessment

## 2023-12-09 NOTE — Plan of Care (Signed)
  Problem: Coping: Goal: Level of anxiety will decrease Outcome: Progressing   Problem: Elimination: Goal: Will not experience complications related to urinary retention Outcome: Progressing   Problem: Pain Managment: Goal: General experience of comfort will improve and/or be controlled Outcome: Progressing   Problem: Pain Management: Goal: Pain level will decrease with appropriate interventions Outcome: Progressing

## 2023-12-09 NOTE — Evaluation (Addendum)
 Physical Therapy Evaluation Patient Details Name: Wendy Murray MRN: [id number] DOB: 13-Jun-1936 Today's Date: 12/09/2023  History of Present Illness  Pt is a 88 y.o. female with PMH of asthma, CHF, GERD, HLD and IBS who presents as post-op R THA.  Clinical Impression  Pt was pleasant and motivated to participate during the session and put forth good effort throughout. Pt completed supine therex, per below and was prescribed therex completed as a part of HEP. Pt was educated on anterior hip precautions. Pt required min assist to come to sitting EOB from supine. Pt required CGA for STS from EOB and ambulation with RW. Although pt demonstrated decreased weight shift and stance time on RLE during ambulation, pt remained steady throughout and no LOBs occurred. A chair follow was utilized for pt safety, but was not needed. Pt reported no adverse symptoms during the session with SpO2 and HR WNL throughout on 2L/min. Pt will benefit from continued PT services upon discharge to safely address deficits listed in patient problem list for decreased caregiver assistance and eventual return to PLOF.          If plan is discharge home, recommend the following: A little help with walking and/or transfers;Assistance with cooking/housework;Assist for transportation;A little help with bathing/dressing/bathroom   Can travel by private vehicle        Equipment Recommendations None recommended by PT  Recommendations for Other Services       Functional Status Assessment Patient has had a recent decline in their functional status and demonstrates the ability to make significant improvements in function in a reasonable and predictable amount of time.     Precautions / Restrictions Precautions Precautions: Anterior Hip Precaution Booklet Issued:  (Pt educated on positions to avoid with anterior hip precautions as a part of written HEP) Restrictions Weight Bearing Restrictions Per Provider Order: Yes RLE  Weight Bearing Per Provider Order: Weight bearing as tolerated      Mobility  Bed Mobility Overal bed mobility: Needs Assistance Bed Mobility: Supine to Sit     Supine to sit: Min assist     General bed mobility comments: Pt required min assist with getting BLE off the EOB when coming to sitting from supine    Transfers Overall transfer level: Needs assistance Equipment used: Rolling walker (2 wheels) Transfers: Sit to/from Stand Sit to Stand: Contact guard assist           General transfer comment: Pt required CGA to complete STS from EOB with RW. Pt remained steady completing STS and no LOBs occured.    Ambulation/Gait Ambulation/Gait assistance: Contact guard assist, +2 safety/equipment Gait Distance (Feet): 50 Feet Assistive device: Rolling walker (2 wheels) Gait Pattern/deviations: Step-through pattern, Knee hyperextension - right, Knee hyperextension - left, Decreased step length - right, Decreased step length - left, Decreased weight shift to right, Decreased stance time - right Gait velocity: decreased     General Gait Details: Pt demonstrated decreased stance time and weight shift towards RLE during ambulation and decreased cadence, but remained steady throughout and no LOBs occured. A chair follow was utilized for pt safety, but was not needed.  Stairs            Wheelchair Mobility     Tilt Bed    Modified Rankin (Stroke Patients Only)       Balance Overall balance assessment: Needs assistance Sitting-balance support: Feet supported, No upper extremity supported Sitting balance-Leahy Scale: Good Sitting balance - Comments: Pt able to maintain balance in sitting  without physical assistance or use of bedrails     Standing balance-Leahy Scale: Fair Standing balance comment: Pt able to maintain balance in standing and ambulation with RW                             Pertinent Vitals/Pain Pain Assessment Pain Assessment: 0-10 Pain  Score: 5  Pain Location: R hip Pain Descriptors / Indicators: Constant, Discomfort, Operative site guarding Pain Intervention(s): Monitored during session    Home Living Family/patient expects to be discharged to:: Private residence Living Arrangements: Alone Available Help at Discharge: Family;Neighbor Type of Home: Independent living facility Home Access: Level entry       Home Layout: One level Home Equipment: Rollator (4 wheels);Rolling Walker (2 wheels);Cane - single point;Shower seat;Grab bars - tub/shower;Grab bars - toilet;Toilet riser      Prior Function Prior Level of Function : Independent/Modified Independent             Mobility Comments: Pt reported being independent with household ambulation without the use of AD. Pt reported 2 falls in the last 6 months tripping over objects ADLs Comments: Pt reported being independent with ADLs without the use of AD.     Extremity/Trunk Assessment   Upper Extremity Assessment Upper Extremity Assessment: Defer to OT evaluation    Lower Extremity Assessment Lower Extremity Assessment: Overall WFL for tasks assessed       Communication   Communication Communication: Impaired Factors Affecting Communication: Hearing impaired    Cognition Arousal: Alert Behavior During Therapy: WFL for tasks assessed/performed   PT - Cognitive impairments: No apparent impairments                         Following commands: Intact       Cueing Cueing Techniques: Verbal cues, Gestural cues     General Comments      Exercises Total Joint Exercises Ankle Circles/Pumps: AROM, Right, 10 reps Gluteal Sets: AROM, Right, 10 reps Heel Slides: PROM, Right, 10 reps Hip ABduction/ADduction: PROM, Right, 10 reps   Assessment/Plan    PT Assessment Patient needs continued PT services  PT Problem List Decreased strength;Decreased mobility;Decreased range of motion;Decreased activity tolerance;Decreased balance;Pain        PT Treatment Interventions DME instruction;Therapeutic exercise;Gait training;Balance training;Stair training;Functional mobility training;Therapeutic activities;Patient/family education    PT Goals (Current goals can be found in the Care Plan section)  Acute Rehab PT Goals Patient Stated Goal: wants to get back to walking back at home with no pain PT Goal Formulation: With patient Time For Goal Achievement: 12/22/23 Potential to Achieve Goals: Good    Frequency BID     Co-evaluation               AM-PAC PT 6 Clicks Mobility  Outcome Measure Help needed turning from your back to your side while in a flat bed without using bedrails?: A Little Help needed moving from lying on your back to sitting on the side of a flat bed without using bedrails?: A Little Help needed moving to and from a bed to a chair (including a wheelchair)?: A Little Help needed standing up from a chair using your arms (e.g., wheelchair or bedside chair)?: A Little Help needed to walk in hospital room?: A Little Help needed climbing 3-5 steps with a railing? : A Lot 6 Click Score: 17    End of Session Equipment Utilized During Treatment: Gait belt  Activity Tolerance: Patient tolerated treatment well Patient left: in chair;with call bell/phone within reach Nurse Communication: Mobility status PT Visit Diagnosis: Other abnormalities of gait and mobility (R26.89);Difficulty in walking, not elsewhere classified (R26.2);Pain Pain - Right/Left: Right Pain - part of body: Hip    Time: 8647-8561 PT Time Calculation (min) (ACUTE ONLY): 46 min   Charges:                 Leontine Ingles, SPT 12/09/23, 3:39 PM This entire session was performed under direct supervision and direction of a licensed therapist/therapist assistant. I have personally read, edited and approve of the note as written.  Carmin Deed, DPT

## 2023-12-09 NOTE — Anesthesia Postprocedure Evaluation (Signed)
 Anesthesia Post Note  Patient: Wendy Murray  Procedure(s) Performed: ARTHROPLASTY, HIP, TOTAL, ANTERIOR APPROACH (Right: Hip)  Patient location during evaluation: PACU Anesthesia Type: Spinal Level of consciousness: awake and alert Pain management: pain level controlled Vital Signs Assessment: post-procedure vital signs reviewed and stable Respiratory status: spontaneous breathing, nonlabored ventilation and respiratory function stable Cardiovascular status: blood pressure returned to baseline and stable Postop Assessment: no apparent nausea or vomiting Anesthetic complications: no   No notable events documented.   Last Vitals:  Vitals:   12/09/23 1140 12/09/23 1145  BP:  (!) 109/45  Pulse: 90 85  Resp: 19 12  Temp:    SpO2: 95% 94%    Last Pain:  Vitals:   12/09/23 1145  TempSrc:   PainSc: 3                  Fairy MARLA Taraya Steward

## 2023-12-09 NOTE — Transfer of Care (Signed)
 Immediate Anesthesia Transfer of Care Note  Patient: Wendy Murray  Procedure(s) Performed: ARTHROPLASTY, HIP, TOTAL, ANTERIOR APPROACH (Right: Hip)  Patient Location: PACU  Anesthesia Type:General  Level of Consciousness: awake and drowsy  Airway & Oxygen Therapy: Patient Spontanous Breathing and Patient connected to face mask oxygen  Post-op Assessment: Report given to RN and Post -op Vital signs reviewed and stable  Post vital signs: Reviewed and stable  Last Vitals:  Vitals Value Taken Time  BP 193/86 12/09/23 10:30  Temp    Pulse 93 12/09/23 10:35  Resp 13 12/09/23 10:35  SpO2 95 % 12/09/23 10:35  Vitals shown include unfiled device data.  Last Pain:  Vitals:   12/09/23 0649  PainSc: 3          Complications: No notable events documented.

## 2023-12-10 ENCOUNTER — Other Ambulatory Visit: Payer: Self-pay | Admitting: Orthopedic Surgery

## 2023-12-10 ENCOUNTER — Encounter: Payer: Self-pay | Admitting: Orthopedic Surgery

## 2023-12-10 DIAGNOSIS — I509 Heart failure, unspecified: Secondary | ICD-10-CM | POA: Diagnosis not present

## 2023-12-10 DIAGNOSIS — I10 Essential (primary) hypertension: Secondary | ICD-10-CM | POA: Diagnosis not present

## 2023-12-10 DIAGNOSIS — R2689 Other abnormalities of gait and mobility: Secondary | ICD-10-CM | POA: Diagnosis not present

## 2023-12-10 DIAGNOSIS — M25551 Pain in right hip: Secondary | ICD-10-CM | POA: Diagnosis not present

## 2023-12-10 DIAGNOSIS — I110 Hypertensive heart disease with heart failure: Secondary | ICD-10-CM | POA: Diagnosis not present

## 2023-12-10 DIAGNOSIS — K589 Irritable bowel syndrome without diarrhea: Secondary | ICD-10-CM | POA: Diagnosis not present

## 2023-12-10 DIAGNOSIS — E0849 Diabetes mellitus due to underlying condition with other diabetic neurological complication: Secondary | ICD-10-CM | POA: Diagnosis not present

## 2023-12-10 DIAGNOSIS — Z8616 Personal history of COVID-19: Secondary | ICD-10-CM | POA: Diagnosis not present

## 2023-12-10 DIAGNOSIS — M1611 Unilateral primary osteoarthritis, right hip: Secondary | ICD-10-CM | POA: Diagnosis not present

## 2023-12-10 DIAGNOSIS — Z96651 Presence of right artificial knee joint: Secondary | ICD-10-CM | POA: Diagnosis not present

## 2023-12-10 DIAGNOSIS — Z86718 Personal history of other venous thrombosis and embolism: Secondary | ICD-10-CM | POA: Diagnosis not present

## 2023-12-10 DIAGNOSIS — M6281 Muscle weakness (generalized): Secondary | ICD-10-CM | POA: Diagnosis not present

## 2023-12-10 DIAGNOSIS — Z9889 Other specified postprocedural states: Secondary | ICD-10-CM | POA: Diagnosis not present

## 2023-12-10 DIAGNOSIS — Z96641 Presence of right artificial hip joint: Secondary | ICD-10-CM | POA: Diagnosis not present

## 2023-12-10 DIAGNOSIS — I5022 Chronic systolic (congestive) heart failure: Secondary | ICD-10-CM | POA: Diagnosis not present

## 2023-12-10 DIAGNOSIS — Z96649 Presence of unspecified artificial hip joint: Secondary | ICD-10-CM | POA: Diagnosis not present

## 2023-12-10 DIAGNOSIS — F419 Anxiety disorder, unspecified: Secondary | ICD-10-CM | POA: Diagnosis not present

## 2023-12-10 DIAGNOSIS — Z741 Need for assistance with personal care: Secondary | ICD-10-CM | POA: Diagnosis not present

## 2023-12-10 DIAGNOSIS — K219 Gastro-esophageal reflux disease without esophagitis: Secondary | ICD-10-CM | POA: Diagnosis not present

## 2023-12-10 DIAGNOSIS — F3341 Major depressive disorder, recurrent, in partial remission: Secondary | ICD-10-CM | POA: Diagnosis not present

## 2023-12-10 DIAGNOSIS — Z8673 Personal history of transient ischemic attack (TIA), and cerebral infarction without residual deficits: Secondary | ICD-10-CM | POA: Diagnosis not present

## 2023-12-10 DIAGNOSIS — F32A Depression, unspecified: Secondary | ICD-10-CM | POA: Diagnosis not present

## 2023-12-10 DIAGNOSIS — E785 Hyperlipidemia, unspecified: Secondary | ICD-10-CM | POA: Diagnosis not present

## 2023-12-10 DIAGNOSIS — I5032 Chronic diastolic (congestive) heart failure: Secondary | ICD-10-CM | POA: Diagnosis not present

## 2023-12-10 DIAGNOSIS — E039 Hypothyroidism, unspecified: Secondary | ICD-10-CM | POA: Diagnosis not present

## 2023-12-10 DIAGNOSIS — Z471 Aftercare following joint replacement surgery: Secondary | ICD-10-CM | POA: Diagnosis not present

## 2023-12-10 MED ORDER — ONDANSETRON HCL 4 MG PO TABS: 4.0000 mg | ORAL_TABLET | Freq: Three times a day (TID) | ORAL | 0 refills | Status: AC | PRN

## 2023-12-10 MED ORDER — CELECOXIB 100 MG PO CAPS: 100.0000 mg | ORAL_CAPSULE | Freq: Every day | ORAL | 0 refills | Status: AC

## 2023-12-10 MED ORDER — ACETAMINOPHEN 500 MG PO TABS: 1000.0000 mg | ORAL_TABLET | Freq: Three times a day (TID) | ORAL | 0 refills | Status: AC

## 2023-12-10 MED ORDER — ACETAMINOPHEN 500 MG PO TABS: 1000.0000 mg | ORAL_TABLET | Freq: Three times a day (TID) | ORAL | 0 refills | Status: DC

## 2023-12-10 MED ORDER — ASPIRIN 81 MG PO TBEC: 81.0000 mg | DELAYED_RELEASE_TABLET | Freq: Two times a day (BID) | ORAL | 0 refills | Status: DC

## 2023-12-10 MED ORDER — ONDANSETRON HCL 4 MG PO TABS: 4.0000 mg | ORAL_TABLET | Freq: Three times a day (TID) | ORAL | 0 refills | Status: DC | PRN

## 2023-12-10 MED ORDER — OXYCODONE HCL 5 MG PO TABS: 5.0000 mg | ORAL_TABLET | Freq: Four times a day (QID) | ORAL | 0 refills | Status: DC | PRN

## 2023-12-10 MED ORDER — ASPIRIN 81 MG PO TBEC: 81.0000 mg | DELAYED_RELEASE_TABLET | Freq: Two times a day (BID) | ORAL | 0 refills | Status: AC

## 2023-12-10 MED ORDER — CELECOXIB 100 MG PO CAPS: 100.0000 mg | ORAL_CAPSULE | Freq: Every day | ORAL | 0 refills | Status: DC

## 2023-12-10 MED ORDER — OXYCODONE HCL 5 MG PO TABS: 5.0000 mg | ORAL_TABLET | ORAL | 0 refills | Status: DC | PRN

## 2023-12-10 NOTE — Plan of Care (Signed)
  Problem: Elimination: Goal: Will not experience complications related to urinary retention Outcome: Progressing   Problem: Pain Managment: Goal: General experience of comfort will improve and/or be controlled Outcome: Progressing   Problem: Safety: Goal: Ability to remain free from injury will improve Outcome: Progressing   Problem: Activity: Goal: Ability to avoid complications of mobility impairment will improve Outcome: Progressing

## 2023-12-10 NOTE — Progress Notes (Signed)
 Physical Therapy Treatment Patient Details Name: Wendy Murray MRN: [id number] DOB: [birth year] Today's Date: 12/10/2023   History of Present Illness Pt is a 88 y.o. female with PMH of asthma, CHF, GERD, HLD and IBS who presents as post-op R THA.    PT Comments  Pt continues to show good post-op progress.  She was able to increase gait distance (and speed), showed better AROM/mobility with R LE and pain is well managed.  Pt pleasant and eager to work with PT, aware of precautions and HEP.  Pt will benefit from ongoing PT per total hip protocols.      If plan is discharge home, recommend the following: A little help with walking and/or transfers;Assistance with cooking/housework;Assist for transportation;A little help with bathing/dressing/bathroom   Can travel by private vehicle        Equipment Recommendations  None recommended by PT    Recommendations for Other Services       Precautions / Restrictions Precautions Precautions: Anterior Hip Restrictions RLE Weight Bearing Per Provider Order: Weight bearing as tolerated     Mobility  Bed Mobility Overal bed mobility: Needs Assistance Bed Mobility: Supine to Sit     Supine to sit: Min assist     General bed mobility comments: Pt needing extra time and cuing to slowly get LEs to and off EOB.  Needed much less physical assist today toward L side of the bed.  Still needing light UE or PT assist with R LE movement    Transfers Overall transfer level: Needs assistance Equipment used: Rolling walker (2 wheels) Transfers: Sit to/from Stand Sit to Stand: Contact guard assist           General transfer comment: CGA rising from multiple surfaces, cues for hand placement and sequencing, no direct assist needed    Ambulation/Gait Ambulation/Gait assistance: Contact guard assist, +2 safety/equipment Gait Distance (Feet): 175 Feet Assistive device: Rolling walker (2 wheels)         General Gait Details: Pt with some  initial expected R LE hesitancy/limp but with cuing for UE use and gait mechanics she was able to show increased speed, symmetry and overall cadence with increased distance   Stairs             Wheelchair Mobility     Tilt Bed    Modified Rankin (Stroke Patients Only)       Balance Overall balance assessment: Needs assistance Sitting-balance support: Feet supported, No upper extremity supported Sitting balance-Leahy Scale: Good Sitting balance - Comments: Pt able to maintain balance in sitting without physical assistance or use of bedrails     Standing balance-Leahy Scale: Good Standing balance comment: appropriate UE reliance standing with                            Communication Communication Communication: Impaired Factors Affecting Communication: Hearing impaired  Cognition Arousal: Alert Behavior During Therapy: WFL for tasks assessed/performed   PT - Cognitive impairments: No apparent impairments                         Following commands: Intact      Cueing Cueing Techniques: Verbal cues, Gestural cues  Exercises Total Joint Exercises Ankle Circles/Pumps: AROM, Right, 10 reps Quad Sets: Strengthening, 10 reps Gluteal Sets: Strengthening, 10 reps Heel Slides: AROM, 10 reps (with resisted leg ext) Hip ABduction/ADduction: Right, 10 reps, AAROM, Strengthening    General Comments  General comments (skin integrity, edema, etc.): Post operative dressing d/c/i pre/post session      Pertinent Vitals/Pain Pain Assessment Pain Assessment: 0-10 Pain Score: 4  Pain Location: R hip    Home Living Family/patient expects to be discharged to:: Private residence Living Arrangements: Alone Available Help at Discharge: Family;Neighbor Type of Home: Independent living facility Home Access: Level entry       Home Layout: One level Home Equipment: Rollator (4 wheels);Rolling Walker (2 wheels);Cane - single point;Shower seat;Grab bars -  tub/shower;Grab bars - toilet;Toilet riser      Prior Function            PT Goals (current goals can now be found in the care plan section) Progress towards PT goals: Progressing toward goals    Frequency    BID      PT Plan      Co-evaluation              AM-PAC PT 6 Clicks Mobility   Outcome Measure  Help needed turning from your back to your side while in a flat bed without using bedrails?: A Little Help needed moving from lying on your back to sitting on the side of a flat bed without using bedrails?: A Little Help needed moving to and from a bed to a chair (including a wheelchair)?: A Little Help needed standing up from a chair using your arms (e.g., wheelchair or bedside chair)?: A Little Help needed to walk in hospital room?: A Little Help needed climbing 3-5 steps with a railing? : A Lot 6 Click Score: 17    End of Session Equipment Utilized During Treatment: Gait belt Activity Tolerance: Patient tolerated treatment well Patient left: in chair;with call bell/phone within reach Nurse Communication: Mobility status PT Visit Diagnosis: Other abnormalities of gait and mobility (R26.89);Difficulty in walking, not elsewhere classified (R26.2);Pain Pain - Right/Left: Right Pain - part of body: Hip     Time: 0823-0850 PT Time Calculation (min) (ACUTE ONLY): 27 min  Charges:    $Gait Training: 8-22 mins $Therapeutic Exercise: 8-22 mins PT General Charges $$ ACUTE PT VISIT: 1 Visit                     Carmin JONELLE Deed, DPT 12/10/2023, 10:46 AM

## 2023-12-10 NOTE — Progress Notes (Signed)
 Pts BP 110/41, pt asymptomatic. MD Onesimo notified, per MD ok to discharge.

## 2023-12-10 NOTE — NC FL2 (Signed)
   MEDICAID FL2 LEVEL OF CARE FORM     IDENTIFICATION  Patient Name: Wendy Murray Birthdate: 1935/07/23 Sex: female Admission Date (Current Location): 12/09/2023  [facility] and [place] Number:  Chiropodist and Address:  [facility], [address] [place], [place] [address]      Provider Number: [id number]  Attending Physician Name and Address:  Onesimo Oneil LABOR, MD  Relative Name and Phone Number:  Dayla Dk  Daughter  Emergency Contact  [phone]    Current Level of Care: Hospital Recommended Level of Care: Skilled Nursing Facility Prior Approval Number:    Date Approved/Denied:   PASRR Number: [id number]  Discharge Plan: SNF    Current Diagnoses: Patient Active Problem List   Diagnosis Date Noted   Arthritis of right hip 12/09/2023   Status post parathyroidectomy 07/15/2023   Primary hyperparathyroidism (HCC) 06/25/2023   Preoperative evaluation to rule out surgical contraindication 05/05/2023   Orthostatic dizziness 05/05/2023   Excessive sweating 03/10/2023   Essential hypertension, benign 05/23/2022   Nodule of left lung 05/23/2022   Unsteadiness on feet 07/30/2021   Other symptoms and signs involving cognitive functions and awareness 07/16/2021   Depression, unspecified 07/12/2021   Muscle weakness (generalized) 07/12/2021   Need for assistance with personal care 07/12/2021   Other abnormalities of gait and mobility 07/12/2021   Other lack of coordination 07/12/2021   Presence of right artificial knee joint 07/12/2021   Abnormal gait 07/12/2021   Status post total knee replacement using cement, right 07/09/2021   Asthmatic bronchitis    Chronic diastolic CHF (congestive heart failure) (HCC) 03/23/2021   History of TIA (transient ischemic attack) 01/23/2021   Sensory hearing loss, bilateral 07/13/2018   GERD (gastroesophageal reflux disease) 12/16/2016   Hyperparathyroidism (HCC)    Advance directive  discussed with patient 12/13/2014   Routine general medical examination at a health care facility 12/12/2013   Hyperlipidemia    Recurrent major depression (HCC)    Hypothyroidism    IBS (irritable bowel syndrome)     Orientation RESPIRATION BLADDER Height & Weight     Self, Time, Situation, Place  Normal Continent Weight: 73.1 kg Height:  5' 5 (165.1 cm)  BEHAVIORAL SYMPTOMS/MOOD NEUROLOGICAL BOWEL NUTRITION STATUS      Continent Diet (Regular diet)  AMBULATORY STATUS COMMUNICATION OF NEEDS Skin   Supervision Verbally Surgical wounds                       Personal Care Assistance Level of Assistance  Bathing, Feeding, Dressing Bathing Assistance: Independent Feeding assistance: Independent Dressing Assistance: Independent     Functional Limitations Info             SPECIAL CARE FACTORS FREQUENCY  PT (By licensed PT)     PT Frequency: 2 times per week              Contractures Contractures Info: Not present    Additional Factors Info  Code Status, Allergies Code Status Info: Full Code Allergies Info: NKDA           Current Medications (12/10/2023):  This is the current hospital active medication list Current Facility-Administered Medications  Medication Dose Route Frequency Provider Last Rate Last Admin   acetaminophen  (TYLENOL ) tablet 1,000 mg  1,000 mg Oral Q8H Onesimo Oneil A, MD   1,000 mg at 12/10/23 9388   albuterol  (PROVENTIL ) (2.5 MG/3ML) 0.083% nebulizer solution 3 mL  3 mL Nebulization Q6H PRN Onesimo Oneil  A, MD       aspirin  chewable tablet 81 mg  81 mg Oral BID Onesimo Oneil LABOR, MD   81 mg at 12/10/23 1002   atorvastatin  (LIPITOR) tablet 40 mg  40 mg Oral QPM Onesimo Oneil LABOR, MD   40 mg at 12/09/23 1815   celecoxib (CELEBREX) capsule 100 mg  100 mg Oral Daily Onesimo Oneil LABOR, MD   100 mg at 12/10/23 1002   irbesartan  (AVAPRO ) tablet 150 mg  150 mg Oral Daily Onesimo Oneil LABOR, MD       levothyroxine  (SYNTHROID ) tablet 100 mcg  100 mcg Oral Daily  Onesimo Oneil LABOR, MD   100 mcg at 12/10/23 9388   mirtazapine  (REMERON ) tablet 15 mg  15 mg Oral QHS Onesimo Oneil LABOR, MD   15 mg at 12/09/23 2112   naphazoline-glycerin (CLEAR EYES REDNESS) ophth solution 1-2 drop  1-2 drop Both Eyes QID PRN Onesimo Oneil LABOR, MD   2 drop at 12/10/23 0123   ondansetron  (ZOFRAN ) tablet 4 mg  4 mg Oral Q6H PRN Onesimo Oneil LABOR, MD       Or   ondansetron  (ZOFRAN ) injection 4 mg  4 mg Intravenous Q6H PRN Onesimo Oneil LABOR, MD       oxyCODONE  (Oxy IR/ROXICODONE ) immediate release tablet 5 mg  5 mg Oral Q4H PRN Onesimo Oneil LABOR, MD   5 mg at 12/10/23 9385   Or   oxyCODONE  (Oxy IR/ROXICODONE ) immediate release tablet 10 mg  10 mg Oral Q4H PRN Onesimo Oneil LABOR, MD       pantoprazole  (PROTONIX ) EC tablet 40 mg  40 mg Oral QHS Onesimo Oneil A, MD   40 mg at 12/09/23 2111   venlafaxine  XR (EFFEXOR -XR) 24 hr capsule 150 mg  150 mg Oral Q breakfast Onesimo Oneil LABOR, MD   150 mg at 12/10/23 9171     Discharge Medications: Please see discharge summary for a list of discharge medications.  Relevant Imaging Results:  Relevant Lab Results:   Additional Information SS# [id number]  Quintella Suzen Jansky, RN

## 2023-12-10 NOTE — Progress Notes (Signed)
 Pts BP 100/47, pt asymptomatic and states she feels fine. MD Onesimo notified. Per MD ok to hold scheduled Irbesartan .

## 2023-12-10 NOTE — Evaluation (Addendum)
 Occupational Therapy Evaluation Patient Details Name: Wendy Murray MRN: [id number] DOB: [birth year] Today's Date: 12/10/2023   History of Present Illness   Pt is a 88 y.o. female with PMH of asthma, CHF, GERD, HLD and IBS who presents as post-op R THA.     Clinical Impressions Pt seen for OT evaluation this date, POD#1 from above surgery. Pt was independent in all ADLs prior to surgery, however occasionally using rolling walker during community mobility. Pt lives at [facility] indep living, and reports she is indep with all her ADLs. Pt is eager to return to PLOF with less pain and improved safety and independence. Pt currently requires set up assist for LB dressing while in seated position and use of a reacher for donning loose fitted pants. Pt amb into BR with initially CGA that progressed to supervision with use of RW for bil UE support. Pt completed sink level oral care with supervision. Pt instructed in self care skills, falls prevention strategies, home/routines modifications, DME/AE for LB bathing and dressing tasks, and compression stocking mgt strategies. Pt verbalized good understanding of all education. Pt would benefit from additional instruction in self care skills and techniques to help maintain precautions with or without assistive devices to support recall and carryover prior to discharge. OT will follow acutely.       If plan is discharge home, recommend the following:   A little help with walking and/or transfers;A little help with bathing/dressing/bathroom;Assist for transportation     Functional Status Assessment   Patient has had a recent decline in their functional status and demonstrates the ability to make significant improvements in function in a reasonable and predictable amount of time.     Equipment Recommendations   None recommended by OT     Recommendations for Other Services         Precautions/Restrictions   Precautions Precautions:  Anterior Hip;Fall Precaution Booklet Issued: Yes (comment) Recall of Precautions/Restrictions: Intact Restrictions Weight Bearing Restrictions Per Provider Order: Yes RLE Weight Bearing Per Provider Order: Weight bearing as tolerated     Mobility Bed Mobility               General bed mobility comments: NT pt in recliner pre/post session    Transfers Overall transfer level: Needs assistance Equipment used: Rolling walker (2 wheels) Transfers: Sit to/from Stand Sit to Stand: Contact guard assist, Supervision           General transfer comment: Initially CGA progressed to supervision with observation of pt's good safety awareness and steady gait with use of RW.      Balance Overall balance assessment: Needs assistance Sitting-balance support: Feet supported, No upper extremity supported Sitting balance-Leahy Scale: Good Sitting balance - Comments: Steady reaching within BOS   Standing balance support: Single extremity supported, During functional activity Standing balance-Leahy Scale: Fair Standing balance comment: Single UE support during static standing, bilateral UE support on RW during amb                           ADL either performed or assessed with clinical judgement   ADL Overall ADL's : Needs assistance/impaired Eating/Feeding: Supervision/ safety   Grooming: Wash/dry face;Oral care;Wash/dry hands;Standing;Supervision/safety Grooming Details (indicate cue type and reason): Sink level ADLs         Upper Body Dressing : Set up;Sitting   Lower Body Dressing: Set up;Sit to/from stand;Cueing for safety Lower Body Dressing Details (indicate cue type and reason): Uses  reacher at home and wears slip on shoes Toilet Transfer: Supervision/safety;Rolling walker (2 wheels);Cueing for safety           Functional mobility during ADLs: Supervision/safety;Rolling walker (2 wheels) General ADL Comments: Set upA dressing tasks from STS, toilet  transfer with supervision with verbal cues for technique     Vision Baseline Vision/History: 1 Wears glasses                         Pertinent Vitals/Pain Pain Assessment Pain Assessment: Faces Faces Pain Scale: Hurts a little bit Pain Location: R hip (Pain only with prolonged standing) Pain Descriptors / Indicators: Discomfort, Operative site guarding Pain Intervention(s): Limited activity within patient's tolerance, Monitored during session     Extremity/Trunk Assessment Upper Extremity Assessment Upper Extremity Assessment: Overall WFL for tasks assessed   Lower Extremity Assessment Lower Extremity Assessment: Defer to PT evaluation;RLE deficits/detail RLE Deficits / Details: Anticipated deficits related to TKA RLE Sensation: WNL   Cervical / Trunk Assessment Cervical / Trunk Assessment: Normal   Communication Communication Communication: Impaired Factors Affecting Communication: Hearing impaired   Cognition Arousal: Alert Behavior During Therapy: WFL for tasks assessed/performed Cognition: No apparent impairments             OT - Cognition Comments: A/Ox4                 Following commands: Intact       Cueing  General Comments   Cueing Techniques: Verbal cues;Gestural cues  Post operative dressing d/c/i pre/post session   Exercises Exercises: Other exercises Other Exercises Other Exercises: Edu: Role of OT eval, safe ADL completion with adherence to hip precautions.   Shoulder Instructions      Home Living Family/patient expects to be discharged to:: Private residence Living Arrangements: Alone Available Help at Discharge: Family;Neighbor Type of Home: Independent living facility Home Access: Level entry     Home Layout: One level     Bathroom Shower/Tub: Chief Strategy Officer: Handicapped height Bathroom Accessibility: Yes How Accessible: Accessible via walker Home Equipment: Rollator (4 wheels);Rolling Walker  (2 wheels);Cane - single point;Shower seat;Grab bars - tub/shower;Grab bars - toilet;Toilet riser          Prior Functioning/Environment Prior Level of Function : Independent/Modified Independent             Mobility Comments: Pt reported being independent with household ambulation without the use of AD. Pt reported 2 falls in the last 6 months tripping over objects ADLs Comments: Pt reported being independent with ADLs without the use of AD.    OT Problem List: Decreased strength;Decreased activity tolerance;Impaired balance (sitting and/or standing);Decreased coordination;Decreased safety awareness;Decreased knowledge of use of DME or AE   OT Treatment/Interventions: Self-care/ADL training;Energy conservation;DME and/or AE instruction;Patient/family education;Balance training;Therapeutic activities      OT Goals(Current goals can be found in the care plan section)   Acute Rehab OT Goals Patient Stated Goal: Go to rehab stay OT Goal Formulation: With patient Time For Goal Achievement: 12/24/23 Potential to Achieve Goals: Good ADL Goals Pt Will Perform Grooming: with modified independence;standing Pt Will Perform Lower Body Dressing: with modified independence;sit to/from stand Pt Will Transfer to Toilet: with modified independence;ambulating Pt Will Perform Toileting - Clothing Manipulation and hygiene: with modified independence;sit to/from stand   OT Frequency:  Min 3X/week    Co-evaluation              AM-PAC OT 6 Clicks Daily Activity  Outcome Measure Help from another person eating meals?: None Help from another person taking care of personal grooming?: A Little Help from another person toileting, which includes using toliet, bedpan, or urinal?: None Help from another person bathing (including washing, rinsing, drying)?: A Little Help from another person to put on and taking off regular upper body clothing?: None Help from another person to put on and  taking off regular lower body clothing?: A Little 6 Click Score: 21   End of Session Equipment Utilized During Treatment: Gait belt;Rolling walker (2 wheels) Nurse Communication: Mobility status  Activity Tolerance: Patient tolerated treatment well Patient left: in chair;with call bell/phone within reach  OT Visit Diagnosis: Other abnormalities of gait and mobility (R26.89);Repeated falls (R29.6)                Time: 9086-9057 OT Time Calculation (min): 29 min Charges:  OT General Charges $OT Visit: 1 Visit OT Evaluation $OT Eval Moderate Complexity: 1 Mod OT Treatments $Self Care/Home Management : 8-22 mins  Larraine Colas M.S. OTR/L  12/10/23, 10:55 AM

## 2023-12-10 NOTE — Discharge Instructions (Signed)
  Wendy Murray A. Onesimo, MD MS [facility] [address] [place],  [place]  [address] Phone: [phone] Fax: [phone]    POST-OPERATIVE INSTRUCTIONS - TOTAL HIP REPLACEMENT    WOUND CARE You may leave the operative dressing in place until your follow-up appointment. KEEP THE INCISIONS CLEAN AND DRY. There may be a small amount of fluid/bleeding leaking at the surgical site. This is normal after surgery.  If it fills with liquid or blood please call us  immediately to change it for you.  SHOWERING: - You may shower on Post-Op Day #3.  - The dressing is water resistant but do not scrub it as it may start to peel up.   - Gently pat the area dry.  - Do not soak the hip in water. Do not go swimming in the pool or ocean until your sutures are removed. - KEEP THE INCISIONS CLEAN AND DRY.  EXERCISES You have no restrictions You can bear weight on your leg Recommend you use a walker to help provide support when walking We can initiate physical therapy if interested  REGIONAL ANESTHESIA (NERVE BLOCKS) The anesthesia team may have performed a nerve block for you if safe in the setting of your care.  This is a great tool used to minimize pain.  Typically the block may start wearing off overnight but the long acting medicine may last for 3-4 days.  The nerve block wearing off can be a challenging period but please utilize your as needed pain medications to try and manage this period.    POST-OP MEDICATIONS- Multimodal approach to pain control In general your pain will be controlled with a combination of substances.  Prescriptions unless otherwise discussed are electronically sent to your pharmacy.  This is a carefully made plan we use to minimize narcotic use.     Meloxicam/Ibuprofen /Celebrex - Anti-inflammatory medication taken on a scheduled basis Acetaminophen  - Non-narcotic pain medicine taken on a scheduled basis  Oxycodone  - This is a strong narcotic, to be  used only on an "as needed" basis for pain. Aspirin  81mg  - This medicine is used to minimize the risk of blood clots after surgery. Zofran  -  take as needed for nausea  Meloxicam/Celebrex/ibuprofen  - these are anti-inflammatory and pain relievers.  Do not take additional ibuprofen , naproxen or other NSAID while taking this medicine.   FOLLOW-UP If you develop a Fever (>101.5), Redness or Drainage from the surgical incision site, please call our office to arrange for an evaluation. Please call the office to schedule a follow-up appointment for a wound check, 7-10 days post-operatively.    NARCOTIC MANAGEMENT  Per [facility] clinic policy, our goal is ensure optimal postoperative pain control with a multimodal pain management strategy.   For all [facility] patients, our goal is to wean post-operative narcotic medications by 6 weeks post-operatively.   If this is not possible due to utilization of pain medication prior to surgery, your [facility] doctor will support your acute post-operative pain control for the first 6 weeks postoperatively, with a plan to transition you back to your primary pain team following that.   [facility] will work to ensure a Therapist, occupational.

## 2023-12-10 NOTE — TOC Transition Note (Signed)
 Transition of Care [facility]) - Discharge Note   Patient Details  Name: Wendy Murray MRN: [id number] Date of Birth: 1936-01-23  Transition of Care [facility]) CM/SW Contact:  Quintella Suzen Jansky, RN Phone Number: 12/10/2023, 11:49 AM   Clinical Narrative:    Patient is requesting to discharge to [facility] short term rehab. Per physical therapy patient ambulated 264ft and initially recommended home health services. Occupational therapy signed off on patient as she is currently at her baseline. Patient stated her daughter will not be in town until 12/16/2023 to assist her, which is why she does not want to return to her home. Recommendations were changed to SNF. PASRR, FL2 and referral sent to [facility] via [facility]. [facility] will provided transportation to facility.  Patient going to room 118, nurse to call report [phone]. Notified bedside nurse.     Final next level of care: Skilled Nursing Facility Barriers to Discharge: Barriers Resolved   Patient Goals and CMS Choice Patient states their goals for this hospitalization and ongoing recovery are:: I want to go to [facility] short term rehab CMS Medicare.gov Compare Post Acute Care list provided to:: Patient Choice offered to / list presented to : Patient      Discharge Placement              Patient chooses bed at: [facility] Patient to be transferred to facility by: [facility]   Patient and family notified of of transfer: 12/10/23  Discharge Plan and Services Additional resources added to the After Visit Summary for                    DME Agency: NA       HH Arranged: NA          Social Drivers of Health (SDOH) Interventions SDOH Screenings   Food Insecurity: No Food Insecurity (12/09/2023)  Housing: Low Risk  (12/09/2023)  Transportation Needs: No Transportation Needs (12/09/2023)  Utilities: Not At Risk (12/09/2023)  Depression (PHQ2-9): Low Risk  (11/13/2023)  Financial Resource Strain: Low Risk   (07/04/2023)  Physical Activity: Unknown (07/04/2023)  Social Connections: Moderately Integrated (12/09/2023)  Stress: Stress Concern Present (07/04/2023)  Tobacco Use: Medium Risk (12/09/2023)     Readmission Risk Interventions     No data to display

## 2023-12-10 NOTE — Progress Notes (Signed)
 Report called to [place], RN @ [facility].

## 2023-12-10 NOTE — Progress Notes (Signed)
 [facility] here to transport pt

## 2023-12-10 NOTE — Care Management Obs Status (Signed)
 MEDICARE OBSERVATION STATUS NOTIFICATION   Patient Details  Name: Wendy Murray MRN: [id number] Date of Birth: Jan 09, 1936   Medicare Observation Status Notification Given:  No (patient did not want a copy)    [name] 12/10/2023, 9:01 AM

## 2023-12-10 NOTE — Discharge Summary (Signed)
 Patient ID: Wendy Murray MRN: [id number] DOB/AGE: 11-11-1935 88 y.o.  Admit date: 12/09/2023 Discharge date: 12/10/2023  Admission Diagnoses: Right hip arthritis  Discharge Diagnoses:  Principal Problem:   Arthritis of right hip   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  Tolerated procedure well.  Was kept for monitoring overnight for pain control and medical monitoring.  Patient was evaluated by PT and OT and deemed to be stable for DC the morning after surgery. Patient was instructed on specific activity restrictions and all questions were answered.  All medical equipment needed was coordinated for the patient.  They were stable for DC on POD#1.    Consults: None  Significant Diagnostic Studies: No additional pertinent studies  Treatments: Surgery  Discharge Exam:  Vitals:   12/10/23 0555 12/10/23 0755  BP: (!) 141/82 (!) 140/59  Pulse: 92 88  Resp: 16 16  Temp: 97.8 F (36.6 C) 98.6 F (37 C)  SpO2: 95% 98%    Alert and oriented, no acute distress  Surgical dressing is clean, dry and intact Minimal  swelling Sensation to the foot is intact 2+ DP pulse Active motion intact in the EHL/TA  CBC    Latest Ref Rng & Units 12/09/2023   10:51 AM 12/02/2023   11:01 AM 07/13/2023    8:04 AM  CBC  WBC 4.0 - 10.5 K/uL  8.6  9.1   Hemoglobin 12.0 - 15.0 g/dL 88.2  87.8  87.5   Hematocrit 36.0 - 46.0 % 37.3  38.0  39.0   Platelets 150 - 400 K/uL  386  599       Disposition: Discharge disposition: 01-Home or Self Care       Discharge Instructions     Discharge patient   Complete by: As directed    Discharge disposition: 01-Home or Self Care   Discharge patient date: 12/10/2023      Allergies as of 12/10/2023   No Known Allergies      Medication List     TAKE these medications    acetaminophen  500 MG tablet Commonly known as: TYLENOL  Take 2 tablets (1,000 mg total) by mouth every 8 (eight) hours for 14 days.   albuterol  108 (90 Base) MCG/ACT inhaler Commonly known as: VENTOLIN  HFA Inhale 2 puffs into the lungs every 6 (six) hours as needed for wheezing or shortness of breath.   Align 4 MG Caps Take 4 mg by mouth at bedtime.   amoxicillin  500 MG tablet Commonly known as: AMOXIL  Take 4 tablets (2,000 mg total) by mouth daily. 1 hour  before dental appointment   aspirin  EC 81 MG tablet Take 1 tablet (81 mg total) by mouth in the morning and at bedtime. Swallow whole.   aspirin -acetaminophen -caffeine 250-250-65 MG tablet Commonly known as: EXCEDRIN MIGRAINE Take 1 tablet by mouth every 6 (six) hours as needed for headache.   atorvastatin  40 MG tablet Commonly known as: LIPITOR Take 1 tablet (40 mg total) by mouth every evening.   celecoxib 100 MG capsule Commonly known as: CeleBREX Take 1 capsule (100 mg total) by mouth daily for 14 days.   cholecalciferol  25 MCG (1000 UNIT) tablet Commonly known as: VITAMIN D3 Take 1,000 Units by mouth daily.   clonazePAM  1 MG tablet Commonly known as: KLONOPIN  Take 0.25 mg by mouth at bedtime.   folic acid 400 MCG  tablet Commonly known as: FOLVITE Take 400 mcg by mouth daily.   levothyroxine  100 MCG tablet Commonly known as: SYNTHROID  Take 1 tablet (100 mcg total) by mouth daily.   mirtazapine  15 MG tablet Commonly known as: REMERON  Take 1 tablet (15 mg total) by mouth at bedtime.   multivitamin with minerals tablet Take 2 tablets by mouth daily. Gummy   IMMUNE SUPPORT PO Take 1 tablet by mouth daily. With Zinc   mupirocin ointment 2 % Commonly known as: BACTROBAN Apply small amount to the inside of both nostrils TWICE a day for the next 5 days.   omeprazole  20 MG capsule Commonly known as: PRILOSEC Take 1 capsule (20 mg total) by mouth every evening.   ondansetron  4 MG tablet Commonly known as: Zofran  Take 1 tablet (4 mg total) by mouth every 8 (eight) hours as needed for up to 14 days for nausea or vomiting.   oxyCODONE  5 MG immediate release tablet Commonly known as: Roxicodone  Take 1 tablet (5 mg total) by mouth every 4 (four) hours as needed for up to 7 days.   Slow Release Iron 45 MG Tbcr Generic drug: Ferrous Sulfate  Dried Take 45 mg by mouth daily.   valsartan  160 MG tablet Commonly known as: DIOVAN  Take 1 tablet (160 mg total) by mouth daily.   venlafaxine  XR 150 MG 24 hr capsule Commonly known as: EFFEXOR -XR Take 1 capsule (150 mg total) by mouth daily with breakfast.        Follow-up Information     Onesimo Oneil LABOR, MD Follow up.   Specialties: Orthopedic Surgery, Sports Medicine Why: Please schedule an appointment for follow up on July 14th Contact information: [address] [place] [address] [phone]

## 2023-12-11 ENCOUNTER — Non-Acute Institutional Stay (SKILLED_NURSING_FACILITY): Payer: Self-pay | Admitting: Student

## 2023-12-11 ENCOUNTER — Encounter: Payer: Self-pay | Admitting: Student

## 2023-12-11 DIAGNOSIS — K219 Gastro-esophageal reflux disease without esophagitis: Secondary | ICD-10-CM | POA: Diagnosis not present

## 2023-12-11 DIAGNOSIS — I5032 Chronic diastolic (congestive) heart failure: Secondary | ICD-10-CM | POA: Diagnosis not present

## 2023-12-11 DIAGNOSIS — E039 Hypothyroidism, unspecified: Secondary | ICD-10-CM

## 2023-12-11 DIAGNOSIS — Z96651 Presence of right artificial knee joint: Secondary | ICD-10-CM | POA: Diagnosis not present

## 2023-12-11 DIAGNOSIS — F3341 Major depressive disorder, recurrent, in partial remission: Secondary | ICD-10-CM

## 2023-12-11 DIAGNOSIS — Z9889 Other specified postprocedural states: Secondary | ICD-10-CM

## 2023-12-11 DIAGNOSIS — Z96649 Presence of unspecified artificial hip joint: Secondary | ICD-10-CM

## 2023-12-11 DIAGNOSIS — Z741 Need for assistance with personal care: Secondary | ICD-10-CM | POA: Diagnosis not present

## 2023-12-11 DIAGNOSIS — Z8673 Personal history of transient ischemic attack (TIA), and cerebral infarction without residual deficits: Secondary | ICD-10-CM | POA: Diagnosis not present

## 2023-12-11 DIAGNOSIS — I10 Essential (primary) hypertension: Secondary | ICD-10-CM

## 2023-12-11 DIAGNOSIS — E0849 Diabetes mellitus due to underlying condition with other diabetic neurological complication: Secondary | ICD-10-CM | POA: Diagnosis not present

## 2023-12-11 DIAGNOSIS — Z96641 Presence of right artificial hip joint: Secondary | ICD-10-CM

## 2023-12-11 DIAGNOSIS — G4700 Insomnia, unspecified: Secondary | ICD-10-CM

## 2023-12-11 DIAGNOSIS — Z9089 Acquired absence of other organs: Secondary | ICD-10-CM

## 2023-12-11 MED ORDER — OXYCODONE HCL 5 MG PO TABS
5.0000 mg | ORAL_TABLET | ORAL | 0 refills | Status: AC | PRN
Start: 2023-12-11 — End: 2023-12-18

## 2023-12-11 MED ORDER — CLONAZEPAM 0.5 MG PO TABS: 0.2500 mg | ORAL_TABLET | Freq: Every evening | ORAL | 0 refills | Status: DC | PRN

## 2023-12-11 NOTE — Progress Notes (Signed)
 Provider:  Dr. Richerd Brigham Location:  Other [place].  Nursing Home Room Number: [facility]. [address] [id number] Place of Service:  SNF (31)  PCP: Brigham Richerd, MD Patient Care Team: Brigham Richerd, MD as PCP - General (Family Medicine)  Extended Emergency Contact Information Primary Emergency Contact: Golitzin,Peg Mobile Phone: [phone] Relation: Daughter Secondary Emergency Contact: Krause,Libby Mobile Phone: [phone] Relation: Daughter Preferred language: English Interpreter needed? No  Code Status: DNR Goals of Care: Advanced Directive information    12/09/2023    6:20 AM  Advanced Directives  Does Patient Have a Medical Advance Directive? Yes  Type of Estate agent of [place];Living will  Does patient want to make changes to medical advance directive? No - Patient declined  Copy of Healthcare Power of Attorney in Chart? No - copy requested      Chief Complaint  Patient presents with   New Admit To SNF    Admission.     HPI: Patient is a 88 y.o. female seen today for admission to [facility]. [facility] History of Present Illness The patient presents for follow-up after an elective hip replacement surgery.  The patient recently underwent an elective hip replacement surgery and is experiencing significant pain, particularly at night, which has affected her sleep. She missed a dose of her pain medication, oxycodone  5 mg every four hours, last night due to a pharmacy error, resulting in increased pain this morning. She received a dose this morning and one yesterday afternoon from the facility's supply.  She is currently taking several medications including valsartan  for blood pressure, omeprazole  for acid reflux, mirtazapine  at night, levothyroxine  100 mcg, folic acid, vitamin D , atorvastatin , and Effexor  150 mg daily. She also takes aspirin  81 mg twice a day for DVT prophylaxis and Align probiotic. She uses Excedrin as  needed for headaches but is avoiding it currently due to aspirin  content. She has clonazepam  0.25 mg at night but often forgets to take it, indicating she may not need it regularly.  She has not had a bowel movement since the surgery and has not taken any laxatives. Her abdomen is firm, indicating the need for a bowel movement to prevent further complications.  Her daughter is planning to stay with her for about nine days starting next Wednesday to assist with her recovery at home. She is currently using an ice pack to manage swelling and is engaging in physical and occupational therapy to aid in her recovery.  No issues with nausea and urination is without difficulty. She has not experienced any confusion or sluggishness post-anesthesia.  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   a.) presurgical PCR (+) 12/02/2023 prior to RIGHT THA   Orthostatic dizziness 05/05/2023   Osteoarthritis, knee    Recurrent major depression (HCC)    Sensory hearing loss, bilateral 07/13/2018   TIA (transient ischemic attack) 01/2021   Past Surgical History:  Procedure Laterality Date   ABDOMINAL HYSTERECTOMY  06/16/1989  CATARACT EXTRACTION W/ INTRAOCULAR LENS  IMPLANT, BILATERAL  06/16/2012   CHOLECYSTECTOMY  06/16/1996   COLONOSCOPY     DEXA  08/15/2007   Normal   HAMMER TOE SURGERY Left 10/15/2014   Dr Christine   LUMBAR FUSION  06/17/1991   MENISCUS REPAIR Left 04/16/2010   PARATHYROIDECTOMY Left 06/25/2023   Procedure: NECK EXPLORATION WITH LEFT  SUPERIOR PARATHYROIDECTOMY;  Surgeon: Eletha Boas, MD;  Location: [facility];  Service: General;  Laterality: Left;   PARATHYROIDECTOMY  06/25/2023   TOTAL HIP ARTHROPLASTY Right 12/09/2023   Procedure: ARTHROPLASTY, HIP, TOTAL, ANTERIOR APPROACH;  Surgeon: Onesimo Oneil LABOR, MD;  Location: [facility] ORS;  Service: Orthopedics;  Laterality: Right;   TOTAL KNEE ARTHROPLASTY Left 06/16/2010   TOTAL KNEE ARTHROPLASTY Right 07/09/2021   Procedure: TOTAL KNEE ARTHROPLASTY;  Surgeon: Edie Norleen PARAS, MD;  Location: [facility] ORS;  Service: Orthopedics;  Laterality: Right;   WRIST SURGERY Bilateral     reports that she quit smoking about 42 years ago. Her smoking use included cigarettes. She has been exposed to tobacco smoke. She has never used smokeless tobacco. She reports that she does not currently use alcohol. She reports that she does not use drugs. 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    Comment: Retired  Tobacco Use   Smoking status: Former    Current packs/day: 0.00    Types: Cigarettes    Quit date: 06/16/1981    Years since quitting: 42.5    Passive exposure: Past (as a child)   Smokeless tobacco: Never  Vaping Use   Vaping status: Never Used  Substance and Sexual Activity   Alcohol use: Not Currently    Comment: rarely   Drug use: No   Sexual activity: Not Currently  Other Topics Concern   Not on file  Social History Narrative   Children in [place] , [place]  and [place]    Widowed 1/23      Has living will    Daughter [name], have health care POA   Has DNR ---order rewritten   No feeding tube if cognitively unaware   Will be donating body to [employer]   Social Drivers of Health   Financial Resource Strain: Low Risk   (07/04/2023)   Overall Financial Resource Strain (CARDIA)    Difficulty of Paying Living Expenses: Not hard at all  Food Insecurity: No Food Insecurity (12/09/2023)   Hunger Vital Sign    Worried About Running Out of Food in the Last Year: Never true    Ran Out of Food in the Last Year: Never true  Transportation Needs: No Transportation Needs (12/09/2023)   PRAPARE - Administrator, Civil Service (Medical): No    Lack of Transportation (Non-Medical): No  Physical Activity: Unknown (07/04/2023)   Exercise Vital Sign    Days of Exercise per Week: 0 days    Minutes of Exercise per Session: Not on file  Stress: Stress Concern Present (07/04/2023)   [employer] of Occupational Health - Occupational Stress Questionnaire    Feeling of Stress : Rather much  Social Connections: Moderately Integrated (12/09/2023)   Social Connection and Isolation Panel    Frequency of Communication with Friends and Family: Three times a week  Frequency of Social Gatherings with Friends and Family: Three times a week    Attends Religious Services: More than 4 times per year    Active Member of Clubs or Organizations: Yes    Attends Banker Meetings: 1 to 4 times per year    Marital Status: Widowed  Intimate Partner Violence: Not At Risk (12/09/2023)   Humiliation, Afraid, Rape, and Kick questionnaire    Fear of Current or Ex-Partner: No    Emotionally Abused: No    Physically Abused: No    Sexually Abused: No    Functional Status Survey:    Family History  Problem Relation Age of Onset   Cancer Mother        colon   Depression Mother    Cancer Father    Depression Sister    Hypertension Neg Hx    Heart disease Neg Hx    Diabetes Neg Hx     Health Maintenance  Topic Date Due   INFLUENZA VACCINE  01/15/2024   COVID-19 Vaccine (9 - Moderna risk 2024-25 season) 04/24/2024   DEXA SCAN  11/10/2024   Medicare Annual Wellness (AWV)  11/11/2024   DTaP/Tdap/Td (3 - Td or  Tdap) 05/02/2031   Pneumococcal Vaccine: 50+ Years  Completed   Zoster Vaccines- Shingrix  Completed   Hepatitis B Vaccines  Aged Out   HPV VACCINES  Aged Out   Meningococcal B Vaccine  Aged Out    No Known Allergies  Outpatient Encounter Medications as of 12/11/2023  Medication Sig   acetaminophen  (TYLENOL ) 500 MG tablet Take 2 tablets (1,000 mg total) by mouth every 8 (eight) hours for 14 days.   albuterol  (VENTOLIN  HFA) 108 (90 Base) MCG/ACT inhaler Inhale 2 puffs into the lungs every 6 (six) hours as needed for wheezing or shortness of breath.   aspirin  EC 81 MG tablet Take 1 tablet (81 mg total) by mouth in the morning and at bedtime. Swallow whole.   aspirin -acetaminophen -caffeine (EXCEDRIN MIGRAINE) 250-250-65 MG tablet Take 1 tablet by mouth every 6 (six) hours as needed for headache.   atorvastatin  (LIPITOR) 40 MG tablet Take 1 tablet (40 mg total) by mouth every evening.   celecoxib  (CELEBREX ) 100 MG capsule Take 1 capsule (100 mg total) by mouth daily for 14 days.   cholecalciferol  (VITAMIN D3) 25 MCG (1000 UNIT) tablet Take 1,000 Units by mouth daily.   clonazePAM  (KLONOPIN ) 1 MG tablet Take 0.25 mg by mouth at bedtime.   Ferrous Sulfate  Dried (SLOW RELEASE IRON) 45 MG TBCR Take 45 mg by mouth daily.   folic acid (FOLVITE) 400 MCG tablet Take 400 mcg by mouth daily.   levothyroxine  (SYNTHROID ) 100 MCG tablet Take 1 tablet (100 mcg total) by mouth daily.   mirtazapine  (REMERON ) 15 MG tablet Take 1 tablet (15 mg total) by mouth at bedtime.   Multiple Vitamins-Minerals (IMMUNE SUPPORT PO) Take 1 tablet by mouth daily. With Zinc    Multiple Vitamins-Minerals (MULTIVITAMIN WITH MINERALS) tablet Take 2 tablets by mouth daily. Gummy   omeprazole  (PRILOSEC) 20 MG capsule Take 1 capsule (20 mg total) by mouth every evening.   ondansetron  (ZOFRAN ) 4 MG tablet Take 1 tablet (4 mg total) by mouth every 8 (eight) hours as needed for up to 14 days for nausea or vomiting.   oxyCODONE   (ROXICODONE ) 5 MG immediate release tablet Take 1 tablet (5 mg total) by mouth every 6 (six) hours as needed for up to 7 days. (Patient taking differently: Take 5 mg  by mouth every 4 (four) hours as needed.)   Probiotic Product (ALIGN) 4 MG CAPS Take 4 mg by mouth at bedtime.   valsartan  (DIOVAN ) 160 MG tablet Take 1 tablet (160 mg total) by mouth daily.   venlafaxine  XR (EFFEXOR -XR) 150 MG 24 hr capsule Take 1 capsule (150 mg total) by mouth daily with breakfast.   amoxicillin  (AMOXIL ) 500 MG tablet Take 4 tablets (2,000 mg total) by mouth daily. 1 hour before dental appointment (Patient not taking: Reported on 12/11/2023)   mupirocin  ointment (BACTROBAN ) 2 % Apply small amount to the inside of both nostrils TWICE a day for the next 5 days. (Patient not taking: Reported on 12/11/2023)   No facility-administered encounter medications on file as of 12/11/2023.    Review of Systems  Vitals:   12/11/23 1018  BP: 120/62  Pulse: 99  Resp: 18  Temp: 97.7 F (36.5 C)  SpO2: 94%  Weight: 163 lb 6.4 oz (74.1 kg)  Height: 5' 5 (1.651 m)   Body mass index is 27.19 kg/m. Physical Exam Physical Exam CHEST: Lungs clear to auscultation bilaterally. ABDOMEN: Abdomen firm. MUSCULOSKELETAL: Hip wound dressing clean, dry, and intact. Minimal swelling in hip area. Labs reviewed: Basic Metabolic Panel: Recent Labs    03/10/23 1318 06/16/23 1327 06/26/23 0449 07/13/23 0804 12/02/23 1101  NA 141 137  --  140 139  K 4.7 4.4  --  3.9 3.8  CL 104 105  --  105 105  CO2 29 27  --  27 24  GLUCOSE 110* 105*  --  92 106*  BUN 18 16  --  14 14  CREATININE 0.91 0.75  --  0.69 0.58  CALCIUM  10.7* 10.2 8.7* 8.9  8.9 8.8*  PHOS 3.4  --   --   --   --    Liver Function Tests: Recent Labs    03/10/23 1318 07/13/23 0804  AST  --  18  ALT  --  19  BILITOT  --  0.4  PROT  --  6.4  ALBUMIN 4.1  --    No results for input(s): LIPASE, AMYLASE in the last 8760 hours. No results for input(s):  AMMONIA in the last 8760 hours. CBC: Recent Labs    06/16/23 1327 07/13/23 0804 12/02/23 1101 12/09/23 1051  WBC 7.1 9.1 8.6  --   NEUTROABS  --  5,788  --   --   HGB 12.6 12.4 12.1 11.7*  HCT 39.4 39.0 38.0 37.3  MCV 87.9 85.3 84.6  --   PLT 381 599* 386  --    Cardiac Enzymes: No results for input(s): CKTOTAL, CKMB, CKMBINDEX, TROPONINI in the last 8760 hours. BNP: Invalid input(s): POCBNP Lab Results  Component Value Date   HGBA1C 6.1 (H) 01/23/2021   Lab Results  Component Value Date   TSH 3.42 09/07/2023   Lab Results  Component Value Date   VITAMINB12 >2,000 (H) 07/13/2023   No results found for: FOLATE Lab Results  Component Value Date   IRON 41 (L) 07/13/2023   TIBC 240 (L) 07/13/2023   FERRITIN 147 07/13/2023    Imaging and Procedures obtained prior to SNF admission: DG HIP UNILAT WITH PELVIS 2-3 VIEWS RIGHT Result Date: 12/09/2023 CLINICAL DATA:  Postop right hip surgery. EXAM: DG HIP (WITH OR WITHOUT PELVIS) 2-3V RIGHT COMPARISON:  None Available. FINDINGS: An intact total right hip replacement is seen. There is no evidence of an acute fracture or dislocation. Moderate to marked severity degenerative changes  are seen involving the left hip in the form of joint space narrowing and acetabular sclerosis. Multiple radiopaque skin staples are noted. IMPRESSION: 1. Intact total right hip replacement. 2. Moderate to marked severity degenerative changes involving the left hip. Electronically Signed   By: Suzen Dials M.D.   On: 12/09/2023 12:23   DG HIP UNILAT WITH PELVIS 2-3 VIEWS RIGHT Result Date: 12/09/2023 CLINICAL DATA:  [id number] Surgery, elective [id number] EXAM: DG HIP (WITH OR WITHOUT PELVIS) 2-3V RIGHT COMPARISON:  None Available. FINDINGS: Eleven fluoroscopic spot views of the pelvis and right hip obtained in the operating room. Sequential images during hip arthroplasty. Fluoroscopy time 27 seconds. Dose 2.5722 mGy. IMPRESSION: Intraoperative  fluoroscopy during right hip arthroplasty. Electronically Signed   By: Andrea Gasman M.D.   On: 12/09/2023 10:57   DG C-Arm 1-60 Min-No Report Result Date: 12/09/2023 Fluoroscopy was utilized by the requesting physician.  No radiographic interpretation.   DG C-Arm 1-60 Min-No Report Result Date: 12/09/2023 Fluoroscopy was utilized by the requesting physician.  No radiographic interpretation.   DG C-Arm 1-60 Min-No Report Result Date: 12/09/2023 Fluoroscopy was utilized by the requesting physician.  No radiographic interpretation.    Assessment/Plan Elective hip replacement Postoperative status following elective hip replacement with no complications. Incision is small, wound dressing is clean, dry, and intact, with minimal swelling. - Encourage mobility to aid recovery and prevent complications such as pneumonia or blood clots - Coordinate with physical therapy for discharge planning  Postoperative pain Postoperative pain following elective hip replacement, exacerbated by a missed dose of oxycodone . Pain management is crucial in the first few days post-surgery. - Ensure oxycodone  is delivered to the facility and administered as scheduled - Limit oxycodone  use to the first week post-surgery to prevent dependency - Educate on the potential for constipation due to oxycodone  and the importance of bowel regimen  Constipation No bowel movement since surgery, likely exacerbated by oxycodone  use. Abdomen is firm, indicating the need for bowel movement. - Schedule MiraLAX in the morning and Senna at night to promote bowel movements - Adjust bowel regimen as needed based on stool consistency and frequency  Hypertension Hypertension managed with valsartan .  Gastroesophageal reflux disease (GERD) GERD managed with omeprazole . No current issues with nausea reported.  Hypothyroidism Hypothyroidism managed with levothyroxine  100 mcg.  Hyperlipidemia Hyperlipidemia managed with  atorvastatin .  Depression Depression managed with mirtazapine  and Effexor .  Goals of Care Advanced directives and goals of care discussed. She has a Do Not Resuscitate (DNR) order in place. Preference for hospital transfer in case of serious illness or complications. - Confirm and document DNR order - Ensure preferences for hospital transfer in case of serious illness or complications are noted  Family/ staff Communication: Nursing  Labs/tests ordered: CBC, BMP

## 2023-12-15 LAB — CBC: RBC: 3.33 — AB (ref 3.87–5.11)

## 2023-12-15 LAB — CBC AND DIFFERENTIAL
HCT: 28 — AB (ref 36–46)
Hemoglobin: 8.9 — AB (ref 12.0–16.0)
Neutrophils Absolute: 6270
Platelets: 554 K/uL — AB (ref 150–400)
WBC: 9.4

## 2023-12-24 ENCOUNTER — Non-Acute Institutional Stay (SKILLED_NURSING_FACILITY): Payer: Self-pay | Admitting: Nurse Practitioner

## 2023-12-24 ENCOUNTER — Encounter: Payer: Self-pay | Admitting: Nurse Practitioner

## 2023-12-24 DIAGNOSIS — K5903 Drug induced constipation: Secondary | ICD-10-CM

## 2023-12-24 DIAGNOSIS — D649 Anemia, unspecified: Secondary | ICD-10-CM | POA: Diagnosis not present

## 2023-12-24 DIAGNOSIS — Z96649 Presence of unspecified artificial hip joint: Secondary | ICD-10-CM | POA: Diagnosis not present

## 2023-12-24 DIAGNOSIS — I10 Essential (primary) hypertension: Secondary | ICD-10-CM | POA: Diagnosis not present

## 2023-12-24 DIAGNOSIS — E039 Hypothyroidism, unspecified: Secondary | ICD-10-CM

## 2023-12-24 DIAGNOSIS — I5032 Chronic diastolic (congestive) heart failure: Secondary | ICD-10-CM

## 2023-12-24 DIAGNOSIS — G4700 Insomnia, unspecified: Secondary | ICD-10-CM

## 2023-12-24 NOTE — Progress Notes (Signed)
 Location:  Other [place].  Nursing Home Room Number: [facility] Place of Service:  SNF [phone]) Harlene Caro [facility]ET[facility]  PCP: Abdul Fine, MD  Patient Care Team: Abdul Fine, MD as PCP - General (Family Medicine)  Extended Emergency Contact Information Primary Emergency Contact: Golitzin,Peg Mobile Phone: [phone] Relation: Daughter Secondary Emergency Contact: Krause,Libby Mobile Phone: [phone] Relation: Daughter Preferred language: English Interpreter needed? No  Goals of care: Advanced Directive information    12/09/2023    6:20 AM  Advanced Directives  Does Patient Have a Medical Advance Directive? Yes  Type of Estate agent of [place];Living will  Does patient want to make changes to medical advance directive? No - Patient declined  Copy of Healthcare Power of Attorney in Chart? No - copy requested     Chief Complaint  Patient presents with   Discharge Note    Discharge.     HPI:  Pt is a 88 y.o. female seen today for Discharge home.  She is at [facility] for short term rehab after elective right hip replacement.  She is dong well and pain is controlled on tylenol .  She is not having any constipation.  No abnormal bruising or bleeding to surgery site and has follow up with orthopedics scheduled to remove dressing.    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   a.) presurgical PCR (+) 12/02/2023 prior to RIGHT THA   Orthostatic dizziness 05/05/2023    Osteoarthritis, knee    Recurrent major depression (HCC)    Sensory hearing loss, bilateral 07/13/2018   TIA (transient ischemic attack) 01/2021   Past Surgical History:  Procedure Laterality Date   ABDOMINAL HYSTERECTOMY  06/16/1989   CATARACT EXTRACTION W/ INTRAOCULAR LENS  IMPLANT, BILATERAL  06/16/2012   CHOLECYSTECTOMY  06/16/1996   COLONOSCOPY     DEXA  08/15/2007   Normal   HAMMER TOE SURGERY Left 10/15/2014   Dr Christine   LUMBAR FUSION  06/17/1991   MENISCUS REPAIR Left 04/16/2010   PARATHYROIDECTOMY Left 06/25/2023   Procedure: NECK EXPLORATION WITH LEFT SUPERIOR PARATHYROIDECTOMY;  Surgeon: Eletha Boas, MD;  Location: [facility];  Service: General;  Laterality: Left;   PARATHYROIDECTOMY  06/25/2023   TOTAL HIP ARTHROPLASTY Right 12/09/2023   Procedure: ARTHROPLASTY, HIP, TOTAL, ANTERIOR APPROACH;  Surgeon: Onesimo Oneil LABOR, MD;  Location: [facility] ORS;  Service: Orthopedics;  Laterality: Right;   TOTAL KNEE ARTHROPLASTY Left 06/16/2010   TOTAL KNEE ARTHROPLASTY Right 07/09/2021   Procedure: TOTAL KNEE ARTHROPLASTY;  Surgeon: Edie Norleen PARAS, MD;  Location: [facility] ORS;  Service: Orthopedics;  Laterality: Right;   WRIST SURGERY Bilateral     No Known Allergies  Outpatient Encounter Medications as of 12/24/2023  Medication Sig   acetaminophen  (TYLENOL ) 325 MG tablet Take 650 mg by mouth every 6 (six) hours as needed.   albuterol  (VENTOLIN  HFA) 108 (90 Base) MCG/ACT inhaler Inhale 2 puffs into the lungs every 6 (six) hours as needed for  wheezing or shortness of breath.   aspirin  EC 81 MG tablet Take 1 tablet (81 mg total) by mouth in the morning and at bedtime. Swallow whole.   atorvastatin  (LIPITOR) 40 MG tablet Take 1 tablet (40 mg total) by mouth every evening.   cholecalciferol  (VITAMIN D3) 25 MCG (1000 UNIT) tablet Take 1,000 Units by mouth daily.   clonazePAM  (KLONOPIN ) 0.5 MG tablet Take 0.5 tablets (0.25 mg total) by mouth at bedtime as needed for anxiety.   ferrous sulfate  325  (65 FE) MG EC tablet Take 325 mg by mouth 2 (two) times daily.   folic acid (FOLVITE) 400 MCG tablet Take 400 mcg by mouth daily.   levothyroxine  (SYNTHROID ) 100 MCG tablet Take 1 tablet (100 mcg total) by mouth daily.   mirtazapine  (REMERON ) 15 MG tablet Take 1 tablet (15 mg total) by mouth at bedtime.   Multiple Vitamins-Minerals (MULTIVITAMIN WITH MINERALS) tablet Take 2 tablets by mouth daily. Gummy   omeprazole  (PRILOSEC) 20 MG capsule Take 1 capsule (20 mg total) by mouth every evening.   ondansetron  (ZOFRAN ) 4 MG tablet Take 1 tablet (4 mg total) by mouth every 8 (eight) hours as needed for up to 14 days for nausea or vomiting.   polyethylene glycol (MIRALAX / GLYCOLAX) 17 g packet Take 17 g by mouth daily.   Probiotic Product (ALIGN) 4 MG CAPS Take 4 mg by mouth at bedtime.   senna (SENOKOT) 8.6 MG TABS tablet Take 1 tablet by mouth at bedtime.   valsartan  (DIOVAN ) 160 MG tablet Take 1 tablet (160 mg total) by mouth daily.   venlafaxine  XR (EFFEXOR -XR) 150 MG 24 hr capsule Take 1 capsule (150 mg total) by mouth daily with breakfast.   acetaminophen  (TYLENOL ) 500 MG tablet Take 2 tablets (1,000 mg total) by mouth every 8 (eight) hours for 14 days. (Patient not taking: Reported on 12/24/2023)   aspirin -acetaminophen -caffeine (EXCEDRIN MIGRAINE) 250-250-65 MG tablet Take 1 tablet by mouth every 6 (six) hours as needed for headache. (Patient not taking: Reported on 12/24/2023)   celecoxib  (CELEBREX ) 100 MG capsule Take 1 capsule (100 mg total) by mouth daily for 14 days. (Patient not taking: Reported on 12/24/2023)   Ferrous Sulfate  Dried (SLOW RELEASE IRON) 45 MG TBCR Take 45 mg by mouth daily. (Patient not taking: Reported on 12/24/2023)   Multiple Vitamins-Minerals (IMMUNE SUPPORT PO) Take 1 tablet by mouth daily. With Zinc  (Patient not taking: Reported on 12/24/2023)   No facility-administered encounter medications on file as of 12/24/2023.    Review of Systems  Constitutional:  Negative for  activity change, appetite change, fatigue and unexpected weight change.  HENT:  Negative for congestion and hearing loss.   Eyes: Negative.   Respiratory:  Negative for cough and shortness of breath.   Cardiovascular:  Negative for chest pain, palpitations and leg swelling.  Gastrointestinal:  Negative for abdominal pain, constipation and diarrhea.  Genitourinary:  Negative for difficulty urinating and dysuria.  Musculoskeletal:  Negative for arthralgias and myalgias.  Skin:  Negative for color change and wound.  Neurological:  Negative for dizziness and weakness.  Psychiatric/Behavioral:  Negative for agitation, behavioral problems and confusion.      Immunization History  Administered Date(s) Administered   Fluad Quad(high Dose 65+) 03/04/2019, 02/08/2021   Fluad Trivalent(High Dose 65+) 03/10/2023   Influenza Split 03/16/2013   Influenza, High Dose Seasonal PF 04/09/2016   Influenza, Seasonal, Injecte, Preservative Fre 03/17/2015   Influenza-Unspecified 03/16/2014, 03/16/2017, 04/09/2018   Moderna Sars-Covid-2 Vaccination 07/01/2019, 08/01/2019,  05/01/2020, 11/01/2020, 03/13/2023   Pfizer Covid-19 Vaccine Bivalent Booster 21yrs & up 03/07/2021   Pneumococcal Conjugate-13 12/12/2013   Pneumococcal Polysaccharide-23 06/16/2004, 12/16/2016   RSV,unspecified 06/18/2022   Tdap 07/17/2009, 05/01/2021   Unspecified SARS-COV-2 Vaccination 05/23/2022, 10/23/2023   Varicella 05/01/2021   Zoster Recombinant(Shingrix) 01/18/2020, 05/01/2021   Zoster, Live 06/16/2010   Pertinent  Health Maintenance Due  Topic Date Due   FOOT EXAM  Never done   OPHTHALMOLOGY EXAM  Never done   HEMOGLOBIN A1C  07/26/2021   INFLUENZA VACCINE  01/15/2024   DEXA SCAN  11/10/2024      07/08/2023    1:05 PM 10/07/2023    2:23 PM 11/11/2023    6:03 PM 11/12/2023   10:20 AM 11/13/2023    8:47 AM  Fall Risk  Falls in the past year? 0 1 1 1 1   Was there an injury with Fall? 0 0 0 0 0  Fall Risk Category  Calculator 0 2 2  2 2   Patient at Risk for Falls Due to  No Fall Risks  History of fall(s);Impaired balance/gait Impaired balance/gait  Fall risk Follow up  Falls evaluation completed  Falls evaluation completed Falls evaluation completed     Patient-reported   Functional Status Survey:    Vitals:   12/24/23 0825 12/24/23 0838  BP: (!) 142/74 124/69  Pulse: 82   Resp: 18   Temp: 97.7 F (36.5 C)   SpO2: 92%   Weight: 164 lb 3.2 oz (74.5 kg)   Height: 5' 5 (1.651 m)    Body mass index is 27.32 kg/m. Physical Exam Constitutional:      General: She is not in acute distress.    Appearance: She is well-developed. She is not diaphoretic.  HENT:     Head: Normocephalic and atraumatic.     Mouth/Throat:     Pharynx: No oropharyngeal exudate.  Eyes:     Conjunctiva/sclera: Conjunctivae normal.     Pupils: Pupils are equal, round, and reactive to light.  Cardiovascular:     Rate and Rhythm: Normal rate and regular rhythm.     Heart sounds: Normal heart sounds.  Pulmonary:     Effort: Pulmonary effort is normal.     Breath sounds: Normal breath sounds.  Abdominal:     General: Bowel sounds are normal.     Palpations: Abdomen is soft.  Musculoskeletal:     Cervical back: Normal range of motion and neck supple.     Right lower leg: No edema.     Left lower leg: No edema.  Skin:    General: Skin is warm and dry.  Neurological:     Mental Status: She is alert.  Psychiatric:        Mood and Affect: Mood normal.     Labs reviewed: Recent Labs    03/10/23 1318 06/16/23 1327 06/26/23 0449 07/13/23 0804 12/02/23 1101  NA 141 137  --  140 139  K 4.7 4.4  --  3.9 3.8  CL 104 105  --  105 105  CO2 29 27  --  27 24  GLUCOSE 110* 105*  --  92 106*  BUN 18 16  --  14 14  CREATININE 0.91 0.75  --  0.69 0.58  CALCIUM  10.7* 10.2 8.7* 8.9  8.9 8.8*  PHOS 3.4  --   --   --   --    Recent Labs    03/10/23 1318 07/13/23 0804  AST  --  18  ALT  --  19  BILITOT  --  0.4   PROT  --  6.4  ALBUMIN 4.1  --    Recent Labs    06/16/23 1327 07/13/23 0804 12/02/23 1101 12/09/23 1051 12/15/23 0000  WBC 7.1 9.1 8.6  --  9.4  NEUTROABS  --  5,788  --   --  6,270.00  HGB 12.6 12.4 12.1 11.7* 8.9*  HCT 39.4 39.0 38.0 37.3 28*  MCV 87.9 85.3 84.6  --   --   PLT 381 599* 386  --  554*   Lab Results  Component Value Date   TSH 3.42 09/07/2023   Lab Results  Component Value Date   HGBA1C 6.1 (H) 01/23/2021   Lab Results  Component Value Date   CHOL 148 11/06/2022   HDL 36.90 (L) 11/06/2022   LDLCALC 71 11/06/2022   LDLDIRECT 86.0 11/04/2021   TRIG 197.0 (H) 11/06/2022   CHOLHDL 4 11/06/2022    Significant Diagnostic Results in last 30 days:  DG HIP UNILAT WITH PELVIS 2-3 VIEWS RIGHT Result Date: 12/09/2023 CLINICAL DATA:  Postop right hip surgery. EXAM: DG HIP (WITH OR WITHOUT PELVIS) 2-3V RIGHT COMPARISON:  None Available. FINDINGS: An intact total right hip replacement is seen. There is no evidence of an acute fracture or dislocation. Moderate to marked severity degenerative changes are seen involving the left hip in the form of joint space narrowing and acetabular sclerosis. Multiple radiopaque skin staples are noted. IMPRESSION: 1. Intact total right hip replacement. 2. Moderate to marked severity degenerative changes involving the left hip. Electronically Signed   By: Suzen Dials M.D.   On: 12/09/2023 12:23   DG HIP UNILAT WITH PELVIS 2-3 VIEWS RIGHT Result Date: 12/09/2023 CLINICAL DATA:  [id number] Surgery, elective [id number] EXAM: DG HIP (WITH OR WITHOUT PELVIS) 2-3V RIGHT COMPARISON:  None Available. FINDINGS: Eleven fluoroscopic spot views of the pelvis and right hip obtained in the operating room. Sequential images during hip arthroplasty. Fluoroscopy time 27 seconds. Dose 2.5722 mGy. IMPRESSION: Intraoperative fluoroscopy during right hip arthroplasty. Electronically Signed   By: Andrea Gasman M.D.   On: 12/09/2023 10:57   DG C-Arm 1-60 Min-No  Report Result Date: 12/09/2023 Fluoroscopy was utilized by the requesting physician.  No radiographic interpretation.   DG C-Arm 1-60 Min-No Report Result Date: 12/09/2023 Fluoroscopy was utilized by the requesting physician.  No radiographic interpretation.   DG C-Arm 1-60 Min-No Report Result Date: 12/09/2023 Fluoroscopy was utilized by the requesting physician.  No radiographic interpretation.    Assessment/Plan 1. Status post hip replacement, unspecified laterality (Primary) Due to OA, pain well controlled, doing well with therapy Will continue with outpt PT  2. Acute anemia Due to hip replacement, recent hgb at 9.2, will need to monitor outpt No signs of acute blood loss at this time Continue iron   3. Drug-induced constipation Well controlled at his time  4. Chronic diastolic CHF (congestive heart failure) (HCC) Euvolemic, continue no shortness of breath, edema.   5. Essential hypertension, benign -Blood pressure well controlled, goal bp <140/90 Continue home medications and dietary modifications      Shirelle Tootle K. Caro BODILY [facility] [phone]

## 2023-12-28 ENCOUNTER — Telehealth: Payer: Self-pay

## 2023-12-28 ENCOUNTER — Encounter: Payer: Self-pay | Admitting: Orthopedic Surgery

## 2023-12-28 ENCOUNTER — Ambulatory Visit
Admission: RE | Admit: 2023-12-28 | Discharge: 2023-12-28 | Disposition: A | Discharge status: Home / Self Care | Source: Ambulatory Visit | Attending: Orthopedic Surgery | Admitting: Orthopedic Surgery

## 2023-12-28 ENCOUNTER — Other Ambulatory Visit: Payer: Self-pay | Admitting: Orthopedic Surgery

## 2023-12-28 ENCOUNTER — Ambulatory Visit (INDEPENDENT_AMBULATORY_CARE_PROVIDER_SITE_OTHER): Admitting: Orthopedic Surgery

## 2023-12-28 ENCOUNTER — Ambulatory Visit
Admission: RE | Admit: 2023-12-28 | Discharge: 2023-12-28 | Disposition: A | Discharge status: Home / Self Care | Attending: Orthopedic Surgery | Admitting: Orthopedic Surgery

## 2023-12-28 VITALS — BP 160/88 | Ht 65.0 in | Wt 162.0 lb

## 2023-12-28 DIAGNOSIS — M25551 Pain in right hip: Secondary | ICD-10-CM

## 2023-12-28 DIAGNOSIS — Z471 Aftercare following joint replacement surgery: Secondary | ICD-10-CM | POA: Diagnosis not present

## 2023-12-28 DIAGNOSIS — M1611 Unilateral primary osteoarthritis, right hip: Secondary | ICD-10-CM

## 2023-12-28 DIAGNOSIS — Z96641 Presence of right artificial hip joint: Secondary | ICD-10-CM | POA: Diagnosis not present

## 2023-12-28 MED ORDER — SULFAMETHOXAZOLE-TRIMETHOPRIM 800-160 MG PO TABS: 1.0000 | ORAL_TABLET | Freq: Two times a day (BID) | ORAL | 0 refills | Status: DC

## 2023-12-28 NOTE — Progress Notes (Signed)
 Orthopaedic Postop Note  Assessment: Wendy Murray is a 88 y.o. female s/p Right total hip arthroplasty, anterior approach  DOS: 12/09/23  Plan: Kurtis removed, steri strips placed Small area of wound breakdown, mid incision.  Dry.  Will continue to monitor.  Bactrim  prescription provided. Continue with DVT prophylaxis for at least 6 weeks after surgery WBAT on the operative extremity Follow up in 2 weeks; call with any issues  Meds ordered this encounter  Medications   sulfamethoxazole -trimethoprim  (BACTRIM  DS) 800-160 MG tablet    Sig: Take 1 tablet by mouth 2 (two) times daily for 10 days.    Dispense:  20 tablet    Refill:  0     Follow-up: Return in about 2 weeks (around 01/11/2024). XR at next visit: AP pelvis and Right hip  Subjective:  Chief Complaint  Patient presents with   Routine Post Op    Hip feels fine she can walk without the walker at home she just does not do it and feels she is getting around good     History of Present Illness: Wendy Murray is a 88 y.o. female who presents following the above stated procedure.  Surgery was 2 weeks ago.  She is doing well.  She returned home last week.  She is using a walker to assist with ambulation.  Occasional tylenol  for pain.  She has not had any pain medication since returning home.  She is scheduled for therapy to begin this week   Review of Systems: No fevers or chills No numbness or tingling No Chest Pain No shortness of breath   Objective: BP (!) 160/88   Ht 5' 5 (1.651 m)   Wt 162 lb (73.5 kg)   BMI 26.96 kg/m   Physical Exam:  Alert and oriented.  No acute distress  Anterior right hip surgical incision is healing well, with the exception of a small area in the mid to distal aspect of the incision.  There is a small amount of wound breakdown.  No drainage.  No purulence is appreciated.  This appears to be superficial.  Mild swelling in this area, consistent with a hematoma.  Toes warm  well-perfused.  Decree sensation in the anterior and lateral aspect of the right hip.  Active motion intact in EHL/TA.  IMAGING: I personally ordered and reviewed the following images:  XR of the Right hip and AP pelvis demonstrates a right total hip arthroplasty in good position.  The hip remains reduced.  There is no evidence of implant subsidence.  No acute fractures are noted.  Limb lengths approximately equal.   Impression: Right total hip hemiarthroplasty in stable position, without evidence of migration or subsidence compared to prior XR   Oneil DELENA Horde, MD 12/28/2023 1:43 PM

## 2023-12-28 NOTE — Telephone Encounter (Signed)
 Called patient and provided address to have xrays   patient is going and the orders are in

## 2023-12-29 ENCOUNTER — Telehealth: Payer: Self-pay

## 2023-12-29 ENCOUNTER — Telehealth: Payer: Self-pay | Admitting: Physician Assistant

## 2023-12-29 DIAGNOSIS — Z471 Aftercare following joint replacement surgery: Secondary | ICD-10-CM | POA: Diagnosis not present

## 2023-12-29 DIAGNOSIS — I5022 Chronic systolic (congestive) heart failure: Secondary | ICD-10-CM | POA: Diagnosis not present

## 2023-12-29 DIAGNOSIS — I110 Hypertensive heart disease with heart failure: Secondary | ICD-10-CM | POA: Diagnosis not present

## 2023-12-29 DIAGNOSIS — R2689 Other abnormalities of gait and mobility: Secondary | ICD-10-CM | POA: Diagnosis not present

## 2023-12-29 DIAGNOSIS — M6281 Muscle weakness (generalized): Secondary | ICD-10-CM | POA: Diagnosis not present

## 2023-12-29 DIAGNOSIS — R296 Repeated falls: Secondary | ICD-10-CM | POA: Diagnosis not present

## 2023-12-29 DIAGNOSIS — Z741 Need for assistance with personal care: Secondary | ICD-10-CM | POA: Diagnosis not present

## 2023-12-29 DIAGNOSIS — Z96641 Presence of right artificial hip joint: Secondary | ICD-10-CM | POA: Diagnosis not present

## 2023-12-29 DIAGNOSIS — R278 Other lack of coordination: Secondary | ICD-10-CM | POA: Diagnosis not present

## 2023-12-29 DIAGNOSIS — F32A Depression, unspecified: Secondary | ICD-10-CM | POA: Diagnosis not present

## 2023-12-29 DIAGNOSIS — F419 Anxiety disorder, unspecified: Secondary | ICD-10-CM | POA: Diagnosis not present

## 2023-12-29 NOTE — Telephone Encounter (Signed)
 Patient called and need you to call her about wanting to know if people can drive after a hip replacement. CB#[phone]

## 2023-12-29 NOTE — Telephone Encounter (Signed)
 Called patient and advised her that she can not drive for 6 weeks

## 2024-01-04 ENCOUNTER — Telehealth: Payer: Self-pay | Admitting: Physician Assistant

## 2024-01-04 ENCOUNTER — Telehealth: Payer: Self-pay

## 2024-01-04 ENCOUNTER — Other Ambulatory Visit: Payer: Self-pay | Admitting: Orthopedic Surgery

## 2024-01-04 DIAGNOSIS — Z471 Aftercare following joint replacement surgery: Secondary | ICD-10-CM | POA: Diagnosis not present

## 2024-01-04 DIAGNOSIS — F419 Anxiety disorder, unspecified: Secondary | ICD-10-CM | POA: Diagnosis not present

## 2024-01-04 DIAGNOSIS — Z96641 Presence of right artificial hip joint: Secondary | ICD-10-CM | POA: Diagnosis not present

## 2024-01-04 DIAGNOSIS — I5022 Chronic systolic (congestive) heart failure: Secondary | ICD-10-CM | POA: Diagnosis not present

## 2024-01-04 DIAGNOSIS — I110 Hypertensive heart disease with heart failure: Secondary | ICD-10-CM | POA: Diagnosis not present

## 2024-01-04 DIAGNOSIS — R296 Repeated falls: Secondary | ICD-10-CM | POA: Diagnosis not present

## 2024-01-04 NOTE — Telephone Encounter (Signed)
 Patient called and ask if you could refill her antibiotics medication. CB#[phone]

## 2024-01-04 NOTE — Telephone Encounter (Signed)
 Patient called stating that her incision may be infected and would like a refill on her antibiotic sent to her pharmacy.  CB# [phone].  Please advise.

## 2024-01-05 MED ORDER — SULFAMETHOXAZOLE-TRIMETHOPRIM 800-160 MG PO TABS: 1.0000 | ORAL_TABLET | Freq: Two times a day (BID) | ORAL | 0 refills | Status: DC

## 2024-01-05 NOTE — Addendum Note (Signed)
 Addended by: ONESIMO ANES A on: 01/05/2024 08:25 AM   Modules accepted: Orders

## 2024-01-06 ENCOUNTER — Telehealth: Payer: Self-pay | Admitting: Orthopedic Surgery

## 2024-01-06 ENCOUNTER — Telehealth: Payer: Self-pay

## 2024-01-06 DIAGNOSIS — Z471 Aftercare following joint replacement surgery: Secondary | ICD-10-CM | POA: Diagnosis not present

## 2024-01-06 DIAGNOSIS — I110 Hypertensive heart disease with heart failure: Secondary | ICD-10-CM | POA: Diagnosis not present

## 2024-01-06 DIAGNOSIS — Z96641 Presence of right artificial hip joint: Secondary | ICD-10-CM | POA: Diagnosis not present

## 2024-01-06 DIAGNOSIS — I5022 Chronic systolic (congestive) heart failure: Secondary | ICD-10-CM | POA: Diagnosis not present

## 2024-01-06 DIAGNOSIS — F419 Anxiety disorder, unspecified: Secondary | ICD-10-CM | POA: Diagnosis not present

## 2024-01-06 DIAGNOSIS — R296 Repeated falls: Secondary | ICD-10-CM | POA: Diagnosis not present

## 2024-01-06 NOTE — Telephone Encounter (Signed)
 Dr. Onesimo pt - pt lvm stating she was returning someone's call, she stated she could not get to the phone.

## 2024-01-06 NOTE — Telephone Encounter (Signed)
 Patient called again concerning what she needs to do to care for her right hip incision.  CB# [phone].  Please advise.  Thank you.

## 2024-01-06 NOTE — Telephone Encounter (Signed)
 DR. ONESIMO  Patient called lvm her incision is oozing and infected she wants to know what is she suppose to do.    Please call her back at [phone]

## 2024-01-07 ENCOUNTER — Encounter: Payer: Self-pay | Admitting: Orthopedic Surgery

## 2024-01-08 ENCOUNTER — Ambulatory Visit: Admitting: Student

## 2024-01-08 ENCOUNTER — Encounter: Payer: Self-pay | Admitting: Student

## 2024-01-08 VITALS — BP 138/76 | HR 89 | Temp 97.6°F | Ht 65.0 in | Wt 162.8 lb

## 2024-01-08 DIAGNOSIS — Z96641 Presence of right artificial hip joint: Secondary | ICD-10-CM

## 2024-01-08 DIAGNOSIS — I10 Essential (primary) hypertension: Secondary | ICD-10-CM | POA: Diagnosis not present

## 2024-01-08 DIAGNOSIS — D62 Acute posthemorrhagic anemia: Secondary | ICD-10-CM

## 2024-01-08 DIAGNOSIS — E0849 Diabetes mellitus due to underlying condition with other diabetic neurological complication: Secondary | ICD-10-CM | POA: Diagnosis not present

## 2024-01-08 DIAGNOSIS — F3341 Major depressive disorder, recurrent, in partial remission: Secondary | ICD-10-CM | POA: Diagnosis not present

## 2024-01-08 MED ORDER — VENLAFAXINE HCL ER 75 MG PO CP24
75.0000 mg | ORAL_CAPSULE | Freq: Every day | ORAL | Status: DC
Start: 2024-01-08 — End: 2024-01-19

## 2024-01-08 NOTE — Progress Notes (Signed)
 [facility]: [facility] Provider: ABDUL  Code Status: DNR Goals of Care:     12/09/2023    6:20 AM  Advanced Directives  Does Patient Have a Medical Advance Directive? Yes  Type of Estate agent of [place];Living will  Does patient want to make changes to medical advance directive? No - Patient declined  Copy of Healthcare Power of Attorney in Chart? No - copy requested     Chief Complaint  Patient presents with   Medical Management of Chronic Issues    Medical Management of Chronic Issues. 3 Month follow up    HPI: Patient is a 88 y.o. female seen today for medical management of chronic diseases.   Discussed the use of AI scribe software for clinical note transcription with the patient, who gave verbal consent to proceed.  History of Present Illness   Wendy Murray is an 88 year old female who presents with concerns about a non-healing surgical wound.  She noticed a small area she is concerned may be infected on her surgical wound on Monday and attempted to contact her healthcare provider, receiving a response the following day. She attempted to apply a wet-to-dry dressing technique. The wound has a small pit that has not changed or produced pus.   It is not tender, but there is numbness in the area. There has been no drainage from the wound as reported by the patient.  She has not experienced any pain from the wound and has only taken oxycodone  once, relying primarily on Tylenol  for pain management. She takes a baby aspirin  daily for maintenance and has reduced her clonazepam  to a quarter tablet and Effexor  to 150 mg. She is considering further reducing her Effexor  dosage to 75 mg.  She has not participated in physical therapy since her discharge but has been engaging in occupational therapy and walking regularly. She reports no issues with daily activities such as showering or dressing and is able to care for her dog  without difficulty.  Three weeks ago, her hemoglobin dropped to 8.9 postoperatively, and she is scheduled for a repeat CBC and A1c check next week. She has been driving despite being advised to wait eight weeks post-surgery, as she feels safe and capable.         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   a.) presurgical PCR (+) 12/02/2023 prior to RIGHT THA   Orthostatic dizziness 05/05/2023   Osteoarthritis, knee    Recurrent major depression (HCC)    Sensory hearing loss, bilateral 07/13/2018   TIA (transient ischemic attack) 01/2021    Past Surgical History:  Procedure Laterality Date   ABDOMINAL HYSTERECTOMY  06/16/1989   CATARACT EXTRACTION W/ INTRAOCULAR LENS  IMPLANT, BILATERAL  06/16/2012   CHOLECYSTECTOMY  06/16/1996   COLONOSCOPY     DEXA  08/15/2007   Normal   HAMMER TOE SURGERY Left 10/15/2014   Dr Christine  LUMBAR FUSION  06/17/1991   MENISCUS REPAIR Left 04/16/2010   PARATHYROIDECTOMY Left 06/25/2023   Procedure: NECK EXPLORATION WITH LEFT SUPERIOR PARATHYROIDECTOMY;  Surgeon: Eletha Boas, MD;  Location: [facility];  Service: General;  Laterality: Left;   PARATHYROIDECTOMY  06/25/2023   TOTAL HIP ARTHROPLASTY Right 12/09/2023   Procedure: ARTHROPLASTY, HIP, TOTAL, ANTERIOR APPROACH;  Surgeon: Onesimo Oneil LABOR, MD;  Location: [facility];  Service: Orthopedics;  Laterality: Right;   TOTAL KNEE ARTHROPLASTY Left 06/16/2010   TOTAL KNEE ARTHROPLASTY Right 07/09/2021   Procedure: TOTAL KNEE ARTHROPLASTY;  Surgeon: Edie Norleen PARAS, MD;  Location: [facility];  Service: Orthopedics;  Laterality: Right;   WRIST SURGERY Bilateral     No Known Allergies  Outpatient Encounter Medications as of 01/08/2024  Medication Sig   acetaminophen  (TYLENOL ) 325 MG tablet Take 650 mg by mouth every 6 (six) hours as needed.   albuterol  (VENTOLIN  HFA) 108 (90 Base) MCG/ACT inhaler Inhale 2 puffs into the lungs every 6 (six) hours as needed for wheezing or shortness of breath.   aspirin  EC 81 MG tablet Take 1 tablet (81 mg total) by mouth in the morning and at bedtime. Swallow whole.   atorvastatin  (LIPITOR) 40 MG tablet Take 1 tablet (40 mg total) by mouth every evening.   cholecalciferol  (VITAMIN D3) 25 MCG (1000 UNIT) tablet Take 1,000 Units by mouth daily.   clonazePAM  (KLONOPIN ) 0.5 MG tablet Take 0.5 tablets (0.25 mg total) by mouth at bedtime as needed for anxiety.   ferrous sulfate  325 (65 FE) MG EC tablet Take 325 mg by mouth 2 (two) times daily.   Ferrous Sulfate  Dried (SLOW RELEASE IRON) 45 MG TBCR Take 45 mg by mouth daily.   folic acid (FOLVITE) 400 MCG tablet Take 400 mcg by mouth daily.   levothyroxine  (SYNTHROID ) 100 MCG tablet Take 1 tablet (100 mcg total) by mouth daily.   mirtazapine  (REMERON ) 15 MG tablet Take 1 tablet (15 mg total) by mouth at bedtime.   Multiple Vitamins-Minerals (IMMUNE SUPPORT PO) Take 1 tablet by mouth daily. With Zinc    Multiple Vitamins-Minerals (MULTIVITAMIN WITH MINERALS) tablet Take 2 tablets by mouth daily. Gummy   omeprazole  (PRILOSEC) 20 MG capsule Take 1 capsule (20 mg total) by mouth every evening.   polyethylene glycol (MIRALAX / GLYCOLAX) 17 g packet Take 17 g by mouth daily.   Probiotic Product (ALIGN) 4 MG CAPS Take 4 mg by mouth at bedtime.   senna (SENOKOT) 8.6 MG TABS tablet Take 1 tablet by mouth at bedtime.   sulfamethoxazole -trimethoprim  (BACTRIM  DS) 800-160 MG tablet Take 1 tablet by mouth 2 (two) times daily for 10 days.   valsartan  (DIOVAN ) 160 MG tablet Take 1 tablet (160 mg  total) by mouth daily.   venlafaxine  XR (EFFEXOR  XR) 75 MG 24 hr capsule Take 1 capsule (75 mg total) by mouth daily with breakfast.   [DISCONTINUED] venlafaxine  XR (EFFEXOR -XR) 150 MG 24 hr capsule Take 1 capsule (150 mg total) by mouth daily with breakfast.   [DISCONTINUED] aspirin -acetaminophen -caffeine (EXCEDRIN MIGRAINE) 250-250-65 MG tablet Take 1 tablet by mouth every 6 (six) hours as needed for headache. (Patient not taking: Reported on 01/08/2024)   No facility-administered encounter medications on file as of 01/08/2024.    Review of Systems:  Review of Systems  Health Maintenance  Topic Date Due   FOOT EXAM  Never done   OPHTHALMOLOGY EXAM  Never done   HEMOGLOBIN A1C  07/26/2021   INFLUENZA VACCINE  01/15/2024   COVID-19 Vaccine (  9 - Moderna risk 2024-25 season) 04/24/2024   DEXA SCAN  11/10/2024   Medicare Annual Wellness (AWV)  11/11/2024   DTaP/Tdap/Td (3 - Td or Tdap) 05/02/2031   Pneumococcal Vaccine: 50+ Years  Completed   Zoster Vaccines- Shingrix  Completed   Hepatitis B Vaccines  Aged Out   HPV VACCINES  Aged Out   Meningococcal B Vaccine  Aged Out    Physical Exam: Vitals:   01/08/24 1001  BP: 138/76  Pulse: 89  Temp: 97.6 F (36.4 C)  SpO2: 94%  Weight: 162 lb 12.8 oz (73.8 kg)  Height: 5' 5 (1.651 m)   Body mass index is 27.09 kg/m. Physical Exam Physical Exam   SKIN: Rolled edges with central slough and fibrinous tissue. Wound without signs of infection, with no maceration.      Labs reviewed: Basic Metabolic Panel: Recent Labs    03/10/23 1318 06/16/23 1327 06/26/23 0449 07/13/23 0804 09/07/23 0740 12/02/23 1101  NA 141 137  --  140  --  139  K 4.7 4.4  --  3.9  --  3.8  CL 104 105  --  105  --  105  CO2 29 27  --  27  --  24  GLUCOSE 110* 105*  --  92  --  106*  BUN 18 16  --  14  --  14  CREATININE 0.91 0.75  --  0.69  --  0.58  CALCIUM  10.7* 10.2 8.7* 8.9  8.9  --  8.8*  PHOS 3.4  --   --   --   --   --   TSH 2.13  --   --   7.11* 3.42  --    Liver Function Tests: Recent Labs    03/10/23 1318 07/13/23 0804  AST  --  18  ALT  --  19  BILITOT  --  0.4  PROT  --  6.4  ALBUMIN 4.1  --    No results for input(s): LIPASE, AMYLASE in the last 8760 hours. No results for input(s): AMMONIA in the last 8760 hours. CBC: Recent Labs    06/16/23 1327 07/13/23 0804 12/02/23 1101 12/09/23 1051 12/15/23 0000  WBC 7.1 9.1 8.6  --  9.4  NEUTROABS  --  5,788  --   --  6,270.00  HGB 12.6 12.4 12.1 11.7* 8.9*  HCT 39.4 39.0 38.0 37.3 28*  MCV 87.9 85.3 84.6  --   --   PLT 381 599* 386  --  554*   Lipid Panel: No results for input(s): CHOL, HDL, LDLCALC, TRIG, CHOLHDL, LDLDIRECT in the last 8760 hours. Lab Results  Component Value Date   HGBA1C 6.1 (H) 01/23/2021    Procedures since last visit: DG Hip Unilat W OR W/O Pelvis 2-3 Views Right Result Date: 12/29/2023 CLINICAL DATA:  Pain. EXAM: DG HIP (WITH OR WITHOUT PELVIS) 2-3V RIGHT COMPARISON:  12/09/2023. FINDINGS: Post right total hip arthroplasty. No periprosthetic lucency to suggest loosening. No acute fracture, dislocation or subluxation. Left hip degenerative changes with joint space narrowing, sclerosis and osteophytes. IMPRESSION: Post right total hip arthroplasty. Degenerative changes on the left. Electronically Signed   By: Fonda Field M.D.   On: 12/29/2023 10:25   Results          Assessment/Plan     Non-healing surgical wound She has a non-healing surgical wound with pink rolling edges and slough formation, indicating fibrin presence. The wound is not infected but requires debridement to promote  healing. She has been instructed to keep the wound moist to facilitate easier slough removal during debridement. The wound is not tender, with no drainage or pus. She is scheduled to see the surgeon next Wednesday for further evaluation and potential debridement. She has been informed about the risk of maceration if the wound remains  too moist, identifiable by pale white edges. - Remove staple from the wound - Continue wet-to-dry dressing changes until Wednesday - Forward note to the surgeon for review - Monitor for signs of maceration and adjust moisture levels if edges become pale  Postoperative anemia Her hemoglobin dropped to 8.9 g/dL postoperatively, likely due to blood loss and fluid shifts. A repeat CBC is planned to ensure hemoglobin levels have returned to normal. She has been informed about the need to recheck her blood levels to monitor recovery. - Order repeat CBC next Thursday - Check hemoglobin A1c and cholesterol levels  Medication management She is on a reduced dose of clonazepam  (0.25 mg) and Effexor  (150 mg), considering further reduction to 75 mg. She has a large supply of medications from a previous facility and manages refills through [employer]. She is also taking a daily baby aspirin  for clot prevention. She has been informed about discontinuing 150 mg Effexor  and switching to 75 mg Effexor , and will notify when a refill is needed. - Discontinue 150 mg Effexor  at the pharmacy - Document plan to switch to 75 mg Effexor  in the chart - Notify when a refill of 75 mg Effexor  is needed - Continue daily baby aspirin   General Health Maintenance She engages in physical activity through occupational therapy and walking, managing daily activities independently with no self-care issues. She is aware of the importance of maintaining physical activity for overall health. - Continue occupational therapy twice a week - Encourage regular walking and physical activity  Follow-up She is scheduled for a follow-up appointment with the surgeon next Wednesday and labs next Thursday to monitor hemoglobin and other health parameters. - Follow up with the surgeon next Wednesday - Plan for a three-month follow-up appointment          Labs/tests ordered:  * No order type specified * Next appt:  04/08/2024

## 2024-01-11 ENCOUNTER — Ambulatory Visit: Payer: Self-pay | Admitting: Student

## 2024-01-11 DIAGNOSIS — E0849 Diabetes mellitus due to underlying condition with other diabetic neurological complication: Secondary | ICD-10-CM | POA: Diagnosis not present

## 2024-01-11 DIAGNOSIS — Z96641 Presence of right artificial hip joint: Secondary | ICD-10-CM | POA: Diagnosis not present

## 2024-01-11 DIAGNOSIS — I5022 Chronic systolic (congestive) heart failure: Secondary | ICD-10-CM | POA: Diagnosis not present

## 2024-01-11 DIAGNOSIS — F3341 Major depressive disorder, recurrent, in partial remission: Secondary | ICD-10-CM | POA: Diagnosis not present

## 2024-01-11 DIAGNOSIS — F419 Anxiety disorder, unspecified: Secondary | ICD-10-CM | POA: Diagnosis not present

## 2024-01-11 DIAGNOSIS — I110 Hypertensive heart disease with heart failure: Secondary | ICD-10-CM | POA: Diagnosis not present

## 2024-01-11 DIAGNOSIS — I10 Essential (primary) hypertension: Secondary | ICD-10-CM | POA: Diagnosis not present

## 2024-01-11 DIAGNOSIS — Z471 Aftercare following joint replacement surgery: Secondary | ICD-10-CM | POA: Diagnosis not present

## 2024-01-11 DIAGNOSIS — R296 Repeated falls: Secondary | ICD-10-CM | POA: Diagnosis not present

## 2024-01-12 ENCOUNTER — Encounter: Payer: Self-pay | Admitting: Student

## 2024-01-12 LAB — HEMOGLOBIN A1C
Hgb A1c MFr Bld: 5.6 % (ref <5.7)
Mean Plasma Glucose: 114 mg/dL
eAG (mmol/L): 6.3 mmol/L

## 2024-01-12 LAB — LIPID PANEL
Cholesterol: 140 mg/dL (ref <200)
HDL: 46 mg/dL — ABNORMAL LOW (ref >=50)
LDL Cholesterol (Calc): 74 mg/dL
Non-HDL Cholesterol (Calc): 94 mg/dL (ref <130)
Total CHOL/HDL Ratio: 3 (calc) (ref <5.0)
Triglycerides: 112 mg/dL (ref <150)

## 2024-01-12 LAB — COMPREHENSIVE METABOLIC PANEL WITH GFR
AG Ratio: 1.7 (calc) (ref 1.0–2.5)
ALT: 16 U/L (ref 6–29)
AST: 17 U/L (ref 10–35)
Albumin: 4.1 g/dL (ref 3.6–5.1)
Alkaline phosphatase (APISO): 135 U/L (ref 37–153)
BUN: 15 mg/dL (ref 7–25)
CO2: 26 mmol/L (ref 20–32)
Calcium: 9.1 mg/dL (ref 8.6–10.4)
Chloride: 105 mmol/L (ref 98–110)
Creat: 0.92 mg/dL (ref 0.60–0.95)
Globulin: 2.4 g/dL (ref 1.9–3.7)
Glucose, Bld: 101 mg/dL — ABNORMAL HIGH (ref 65–99)
Potassium: 4.4 mmol/L (ref 3.5–5.3)
Sodium: 139 mmol/L (ref 135–146)
Total Bilirubin: 0.3 mg/dL (ref 0.2–1.2)
Total Protein: 6.5 g/dL (ref 6.1–8.1)
eGFR: 61 mL/min/1.73m2 (ref >=60)

## 2024-01-12 LAB — CBC WITH DIFFERENTIAL/PLATELET
Absolute Lymphocytes: 1909 {cells}/uL (ref 850–3900)
Absolute Monocytes: 712 {cells}/uL (ref 200–950)
Basophils Absolute: 38 {cells}/uL (ref 0–200)
Basophils Relative: 0.6 %
Eosinophils Absolute: 258 {cells}/uL (ref 15–500)
Eosinophils Relative: 4.1 %
HCT: 36.8 % (ref 35.0–45.0)
Hemoglobin: 11.2 g/dL — ABNORMAL LOW (ref 11.7–15.5)
MCH: 26.7 pg — ABNORMAL LOW (ref 27.0–33.0)
MCHC: 30.4 g/dL — ABNORMAL LOW (ref 32.0–36.0)
MCV: 87.6 fL (ref 80.0–100.0)
MPV: 9.4 fL (ref 7.5–12.5)
Monocytes Relative: 11.3 %
Neutro Abs: 3383 {cells}/uL (ref 1500–7800)
Neutrophils Relative %: 53.7 %
Platelets: 439 Thousand/uL — ABNORMAL HIGH (ref 140–400)
RBC: 4.2 Million/uL (ref 3.80–5.10)
RDW: 14.7 % (ref 11.0–15.0)
Total Lymphocyte: 30.3 %
WBC: 6.3 Thousand/uL (ref 3.8–10.8)

## 2024-01-13 ENCOUNTER — Encounter: Payer: Self-pay | Admitting: Orthopedic Surgery

## 2024-01-13 ENCOUNTER — Encounter: Payer: Self-pay | Admitting: Student

## 2024-01-13 ENCOUNTER — Ambulatory Visit (INDEPENDENT_AMBULATORY_CARE_PROVIDER_SITE_OTHER): Admitting: Orthopedic Surgery

## 2024-01-13 DIAGNOSIS — F419 Anxiety disorder, unspecified: Secondary | ICD-10-CM | POA: Diagnosis not present

## 2024-01-13 DIAGNOSIS — T8130XA Disruption of wound, unspecified, initial encounter: Secondary | ICD-10-CM

## 2024-01-13 DIAGNOSIS — Z471 Aftercare following joint replacement surgery: Secondary | ICD-10-CM | POA: Diagnosis not present

## 2024-01-13 DIAGNOSIS — Z96641 Presence of right artificial hip joint: Secondary | ICD-10-CM | POA: Diagnosis not present

## 2024-01-13 DIAGNOSIS — R296 Repeated falls: Secondary | ICD-10-CM | POA: Diagnosis not present

## 2024-01-13 DIAGNOSIS — I5022 Chronic systolic (congestive) heart failure: Secondary | ICD-10-CM | POA: Diagnosis not present

## 2024-01-13 DIAGNOSIS — I110 Hypertensive heart disease with heart failure: Secondary | ICD-10-CM | POA: Diagnosis not present

## 2024-01-13 DIAGNOSIS — M1611 Unilateral primary osteoarthritis, right hip: Secondary | ICD-10-CM

## 2024-01-13 MED ORDER — VALSARTAN 160 MG PO TABS: 160.0000 mg | ORAL_TABLET | Freq: Every day | ORAL | 3 refills | Status: DC

## 2024-01-13 NOTE — Progress Notes (Signed)
 Orthopaedic Postop Note  Assessment: Wendy Murray is a 88 y.o. female s/p Right total hip arthroplasty, anterior approach  DOS: 12/09/23  Plan: [name] is recovering well following right hip arthroplasty.  She denies pain in her groin.  She is ambulating well with a cane.  She continues to work with therapy.  She has some wound dehiscence over the distal and lateral aspect of the hip.  Area is approximately 2 x 1-1/2 cm.  There is a dry scab in this area.  No purulent drainage.  No fluctuance is appreciated.  She continues take antibiotics, due to some earlier drainage.  I have recommended wet-to-dry dressings.  This was demonstrated for her in clinic today.  She will change to these dressings twice per day.  I would like see her back in 2 weeks to assess the wound.  We will also obtain x-rays at that time.   Follow-up: Return in about 2 weeks (around 01/27/2024). XR at next visit: AP pelvis and Right hip  Subjective:  Chief Complaint  Patient presents with   Wound Check    Patient has a wound on the right hip near incision site  she has been doing wet to dry as instructed     History of Present Illness: Wendy Murray is a 88 y.o. female who presents following the above stated procedure.  Surgery was approximately 1 month ago.  Her hip is doing well.  She has no pain in the groin.  She is ambulating with a cane.  She does continue to have some wound issues.  She has been taking Bactrim  since I saw her last.  There is an area of dehiscence, and wound breakdown just lateral to the distal extent of the incision.  It is stable.  Her pain is controlled.  No fevers or chills.   Review of Systems: No fevers or chills No numbness or tingling No Chest Pain No shortness of breath   Objective: There were no vitals taken for this visit.  Physical Exam:  Alert and oriented.  No acute distress  Anterior right hip surgical incision is healing.  There is a small amount of  wound breakdown, in the distal one third of the incision.  Approximately 2 x 1.5 cm.  No purulent drainage is appreciated.  No fluctuance.  There is a dry eschar.  Sensation is intact over the lateral thigh.  She is able to maintain a straight leg raise.  She tolerates gentle range of motion of the right hip.    IMAGING: I personally ordered and reviewed the following images:  No new imaging obtained today.  Oneil DELENA Horde, MD 01/13/2024 2:29 PM

## 2024-01-13 NOTE — Patient Instructions (Signed)
 Continue with wet-to-dry dressings, twice per day  Follow up in 2 weeks.

## 2024-01-14 ENCOUNTER — Encounter: Payer: Self-pay | Admitting: Orthopedic Surgery

## 2024-01-14 DIAGNOSIS — I5022 Chronic systolic (congestive) heart failure: Secondary | ICD-10-CM | POA: Diagnosis not present

## 2024-01-14 DIAGNOSIS — Z96641 Presence of right artificial hip joint: Secondary | ICD-10-CM | POA: Diagnosis not present

## 2024-01-14 DIAGNOSIS — F419 Anxiety disorder, unspecified: Secondary | ICD-10-CM | POA: Diagnosis not present

## 2024-01-14 DIAGNOSIS — R296 Repeated falls: Secondary | ICD-10-CM | POA: Diagnosis not present

## 2024-01-14 DIAGNOSIS — I110 Hypertensive heart disease with heart failure: Secondary | ICD-10-CM | POA: Diagnosis not present

## 2024-01-14 DIAGNOSIS — Z471 Aftercare following joint replacement surgery: Secondary | ICD-10-CM | POA: Diagnosis not present

## 2024-01-18 DIAGNOSIS — F32A Depression, unspecified: Secondary | ICD-10-CM | POA: Diagnosis not present

## 2024-01-18 DIAGNOSIS — Z471 Aftercare following joint replacement surgery: Secondary | ICD-10-CM | POA: Diagnosis not present

## 2024-01-18 DIAGNOSIS — I5022 Chronic systolic (congestive) heart failure: Secondary | ICD-10-CM | POA: Diagnosis not present

## 2024-01-18 DIAGNOSIS — F419 Anxiety disorder, unspecified: Secondary | ICD-10-CM | POA: Diagnosis not present

## 2024-01-18 DIAGNOSIS — R296 Repeated falls: Secondary | ICD-10-CM | POA: Diagnosis not present

## 2024-01-18 DIAGNOSIS — R2689 Other abnormalities of gait and mobility: Secondary | ICD-10-CM | POA: Diagnosis not present

## 2024-01-18 DIAGNOSIS — M6281 Muscle weakness (generalized): Secondary | ICD-10-CM | POA: Diagnosis not present

## 2024-01-18 DIAGNOSIS — Z741 Need for assistance with personal care: Secondary | ICD-10-CM | POA: Diagnosis not present

## 2024-01-18 DIAGNOSIS — I110 Hypertensive heart disease with heart failure: Secondary | ICD-10-CM | POA: Diagnosis not present

## 2024-01-18 DIAGNOSIS — Z96641 Presence of right artificial hip joint: Secondary | ICD-10-CM | POA: Diagnosis not present

## 2024-01-18 DIAGNOSIS — R278 Other lack of coordination: Secondary | ICD-10-CM | POA: Diagnosis not present

## 2024-01-19 ENCOUNTER — Encounter: Payer: Self-pay | Admitting: Student

## 2024-01-19 DIAGNOSIS — E785 Hyperlipidemia, unspecified: Secondary | ICD-10-CM

## 2024-01-19 DIAGNOSIS — E039 Hypothyroidism, unspecified: Secondary | ICD-10-CM

## 2024-01-19 DIAGNOSIS — F32A Depression, unspecified: Secondary | ICD-10-CM

## 2024-01-19 DIAGNOSIS — I10 Essential (primary) hypertension: Secondary | ICD-10-CM

## 2024-01-19 DIAGNOSIS — G4700 Insomnia, unspecified: Secondary | ICD-10-CM

## 2024-01-19 MED ORDER — MIRTAZAPINE 15 MG PO TABS
15.0000 mg | ORAL_TABLET | Freq: Every day | ORAL | 3 refills | Status: AC
Start: 2024-01-19 — End: ?

## 2024-01-19 MED ORDER — LEVOTHYROXINE SODIUM 100 MCG PO TABS: 100.0000 ug | ORAL_TABLET | Freq: Every day | ORAL | 3 refills | Status: AC

## 2024-01-19 MED ORDER — ATORVASTATIN CALCIUM 40 MG PO TABS: 40.0000 mg | ORAL_TABLET | Freq: Every evening | ORAL | 3 refills | Status: AC

## 2024-01-19 MED ORDER — VENLAFAXINE HCL ER 37.5 MG PO CP24: 37.5000 mg | ORAL_CAPSULE | Freq: Every day | ORAL | 2 refills | Status: DC

## 2024-01-19 MED ORDER — VALSARTAN 160 MG PO TABS: 160.0000 mg | ORAL_TABLET | Freq: Every day | ORAL | 3 refills | Status: AC

## 2024-01-19 NOTE — Telephone Encounter (Signed)
 Below is Dr.Beamer's response. It appears she sent rx's electronically versus answering the question about printing, coming to office to sign, and taking to [facility] clinic for pick-up.    Abdul Fine, MD to Me  (Selected Message) VB    01/19/24 11:22 AM Sent all five prescriptions for patient. Removed old prescriptions

## 2024-01-19 NOTE — Telephone Encounter (Signed)
 Dr.Beamer the patients message reflect a different dose on venlafaxine .   We have 75 mg and she is requesting 37.5 mg.   Spoke with patient and she states that you all discussed decreasing venlafaxine , so the next dose is 37.5 mg and she would like rx printed.  Please advise  I have removed clonazepam  off medication list, since patient states she has weaned off.   Patient states she would like all rx's printed off, signed by Dr.Beamer, and instead of mailing to her as requested in the [place] message, she can stop by the [facility] clinic to pick up rx's.   This request will require Dr.Beamer to stop by the [facility] clinic to sign rx's and take with her to the [facility] clinic.

## 2024-01-20 ENCOUNTER — Other Ambulatory Visit: Payer: Self-pay | Admitting: *Deleted

## 2024-01-20 DIAGNOSIS — Z96641 Presence of right artificial hip joint: Secondary | ICD-10-CM | POA: Diagnosis not present

## 2024-01-20 DIAGNOSIS — I5022 Chronic systolic (congestive) heart failure: Secondary | ICD-10-CM | POA: Diagnosis not present

## 2024-01-20 DIAGNOSIS — Z471 Aftercare following joint replacement surgery: Secondary | ICD-10-CM | POA: Diagnosis not present

## 2024-01-20 DIAGNOSIS — I110 Hypertensive heart disease with heart failure: Secondary | ICD-10-CM | POA: Diagnosis not present

## 2024-01-20 DIAGNOSIS — R296 Repeated falls: Secondary | ICD-10-CM | POA: Diagnosis not present

## 2024-01-20 DIAGNOSIS — F419 Anxiety disorder, unspecified: Secondary | ICD-10-CM | POA: Diagnosis not present

## 2024-01-20 NOTE — Patient Outreach (Signed)
 Post-Acute Care Manager follow up. Per Wendy Murray, Wendy Murray discharged from [facility] [place] on 12/25/23 with outpatient therapy services.  [facility] waiver was previously utilized for admission to [facility].  No identifiable complex care management needs.  Pablo Hurst, MSN, RN, BSN [place]  [facility] RN Post- Acute Care Manager Direct Dial: [phone]

## 2024-01-25 DIAGNOSIS — I110 Hypertensive heart disease with heart failure: Secondary | ICD-10-CM | POA: Diagnosis not present

## 2024-01-25 DIAGNOSIS — R296 Repeated falls: Secondary | ICD-10-CM | POA: Diagnosis not present

## 2024-01-25 DIAGNOSIS — F419 Anxiety disorder, unspecified: Secondary | ICD-10-CM | POA: Diagnosis not present

## 2024-01-25 DIAGNOSIS — Z96641 Presence of right artificial hip joint: Secondary | ICD-10-CM | POA: Diagnosis not present

## 2024-01-25 DIAGNOSIS — Z471 Aftercare following joint replacement surgery: Secondary | ICD-10-CM | POA: Diagnosis not present

## 2024-01-25 DIAGNOSIS — I5022 Chronic systolic (congestive) heart failure: Secondary | ICD-10-CM | POA: Diagnosis not present

## 2024-01-27 DIAGNOSIS — Z471 Aftercare following joint replacement surgery: Secondary | ICD-10-CM | POA: Diagnosis not present

## 2024-01-27 DIAGNOSIS — I110 Hypertensive heart disease with heart failure: Secondary | ICD-10-CM | POA: Diagnosis not present

## 2024-01-27 DIAGNOSIS — Z96641 Presence of right artificial hip joint: Secondary | ICD-10-CM | POA: Diagnosis not present

## 2024-01-27 DIAGNOSIS — F419 Anxiety disorder, unspecified: Secondary | ICD-10-CM | POA: Diagnosis not present

## 2024-01-27 DIAGNOSIS — I5022 Chronic systolic (congestive) heart failure: Secondary | ICD-10-CM | POA: Diagnosis not present

## 2024-01-27 DIAGNOSIS — R296 Repeated falls: Secondary | ICD-10-CM | POA: Diagnosis not present

## 2024-01-28 ENCOUNTER — Encounter: Admitting: Orthopedic Surgery

## 2024-01-29 ENCOUNTER — Encounter: Payer: Self-pay | Admitting: Orthopedic Surgery

## 2024-01-29 ENCOUNTER — Ambulatory Visit (INDEPENDENT_AMBULATORY_CARE_PROVIDER_SITE_OTHER): Admitting: Orthopedic Surgery

## 2024-01-29 DIAGNOSIS — T8130XA Disruption of wound, unspecified, initial encounter: Secondary | ICD-10-CM

## 2024-01-29 DIAGNOSIS — M1611 Unilateral primary osteoarthritis, right hip: Secondary | ICD-10-CM

## 2024-01-29 NOTE — Progress Notes (Signed)
 Orthopaedic Postop Note  Assessment: Wendy Murray is a 88 y.o. female s/p Right total hip arthroplasty, anterior approach  DOS: 12/09/23  Plan: Wendy Murray has done well following right hip replacement.  She has no pain.  She is ambulating with a cane, only because she feels a little bit unsteady not because of pain.  Will continue to monitor her incision closely.  She has been doing wet-to-dry dressings.  The large scab that developed has softened.  This is progressing slowly.  Is not getting worse.  No concern for an infection at this time.  I have asked her to transition to using manuka honey.  If this is not progressing appropriately, or there is slow improvement, we will consider a referral to the wound care center.  She states understanding.  Continue wet-to-dry dressings until manuka honey is available.  I would like for her to be evaluated in the interim in [place].  We will work to get this scheduled.  Otherwise, I will see her back in 1 month.   Follow-up: Return in about 4 weeks (around 02/26/2024) for Schedule appointment in [place]. XR at next visit: AP pelvis and Right hip  Subjective:  Chief Complaint  Patient presents with   Routine Post Op    R THA DOS: 12/09/23    History of Present Illness: Wendy Murray is a 88 y.o. female who presents following the above stated procedure.  Surgery was almost 2 months ago.  We have been monitoring some wound breakdown.  She has been doing wet-to-dry dressings for the past 2 weeks.  She does not have fevers or chills.  No drainage.  She notes that the scab is softening.  She has no pain.  She is using a cane to help stabilize herself when ambulating.   Review of Systems: No fevers or chills No numbness or tingling No Chest Pain No shortness of breath   Objective: There were no vitals taken for this visit.  Physical Exam:  Alert and oriented.  No acute distress  Anterior right hip surgical incision is  healed, with the exception of some breakdown, just lateral to the distal portion.  There is now an area of soft eschar, without purulence.  Wound edges appear healthy.  She is ambulating with a cane.  She tolerates gentle range of motion of the right hip.    IMAGING: I personally ordered and reviewed the following images:  No new imaging obtained today.  Oneil DELENA Horde, MD 01/29/2024 10:16 AM

## 2024-02-01 DIAGNOSIS — Z96641 Presence of right artificial hip joint: Secondary | ICD-10-CM | POA: Diagnosis not present

## 2024-02-01 DIAGNOSIS — Z471 Aftercare following joint replacement surgery: Secondary | ICD-10-CM | POA: Diagnosis not present

## 2024-02-01 DIAGNOSIS — F419 Anxiety disorder, unspecified: Secondary | ICD-10-CM | POA: Diagnosis not present

## 2024-02-01 DIAGNOSIS — I110 Hypertensive heart disease with heart failure: Secondary | ICD-10-CM | POA: Diagnosis not present

## 2024-02-01 DIAGNOSIS — I5022 Chronic systolic (congestive) heart failure: Secondary | ICD-10-CM | POA: Diagnosis not present

## 2024-02-01 DIAGNOSIS — R296 Repeated falls: Secondary | ICD-10-CM | POA: Diagnosis not present

## 2024-02-03 DIAGNOSIS — I110 Hypertensive heart disease with heart failure: Secondary | ICD-10-CM | POA: Diagnosis not present

## 2024-02-03 DIAGNOSIS — F419 Anxiety disorder, unspecified: Secondary | ICD-10-CM | POA: Diagnosis not present

## 2024-02-03 DIAGNOSIS — Z96641 Presence of right artificial hip joint: Secondary | ICD-10-CM | POA: Diagnosis not present

## 2024-02-03 DIAGNOSIS — R296 Repeated falls: Secondary | ICD-10-CM | POA: Diagnosis not present

## 2024-02-03 DIAGNOSIS — I5022 Chronic systolic (congestive) heart failure: Secondary | ICD-10-CM | POA: Diagnosis not present

## 2024-02-03 DIAGNOSIS — Z471 Aftercare following joint replacement surgery: Secondary | ICD-10-CM | POA: Diagnosis not present

## 2024-02-08 DIAGNOSIS — Z96641 Presence of right artificial hip joint: Secondary | ICD-10-CM | POA: Diagnosis not present

## 2024-02-08 DIAGNOSIS — R296 Repeated falls: Secondary | ICD-10-CM | POA: Diagnosis not present

## 2024-02-08 DIAGNOSIS — I5022 Chronic systolic (congestive) heart failure: Secondary | ICD-10-CM | POA: Diagnosis not present

## 2024-02-08 DIAGNOSIS — I110 Hypertensive heart disease with heart failure: Secondary | ICD-10-CM | POA: Diagnosis not present

## 2024-02-08 DIAGNOSIS — F419 Anxiety disorder, unspecified: Secondary | ICD-10-CM | POA: Diagnosis not present

## 2024-02-08 DIAGNOSIS — Z471 Aftercare following joint replacement surgery: Secondary | ICD-10-CM | POA: Diagnosis not present

## 2024-02-10 DIAGNOSIS — I110 Hypertensive heart disease with heart failure: Secondary | ICD-10-CM | POA: Diagnosis not present

## 2024-02-10 DIAGNOSIS — R296 Repeated falls: Secondary | ICD-10-CM | POA: Diagnosis not present

## 2024-02-10 DIAGNOSIS — Z471 Aftercare following joint replacement surgery: Secondary | ICD-10-CM | POA: Diagnosis not present

## 2024-02-10 DIAGNOSIS — F419 Anxiety disorder, unspecified: Secondary | ICD-10-CM | POA: Diagnosis not present

## 2024-02-10 DIAGNOSIS — Z96641 Presence of right artificial hip joint: Secondary | ICD-10-CM | POA: Diagnosis not present

## 2024-02-10 DIAGNOSIS — I5022 Chronic systolic (congestive) heart failure: Secondary | ICD-10-CM | POA: Diagnosis not present

## 2024-02-18 ENCOUNTER — Ambulatory Visit: Payer: Self-pay

## 2024-02-18 NOTE — Telephone Encounter (Signed)
Message routed to PCP Beamer, Victoria, MD  

## 2024-02-18 NOTE — Telephone Encounter (Signed)
 Copied from CRM #[id number]. Topic: Clinical - Red Word Triage >> Feb 18, 2024  3:11 PM Alfonso ORN wrote: Red Word that prompted transfer to Nurse Triage: feel off balance ,  head feel stuff up ,  ears feel  blocked   ----------------------------------------------------------------------- From previous Reason for Contact - Scheduling: Patient/patient representative is calling to schedule an appointment. Refer to attachments for appointment information. Reason for Disposition  Earache  [1] MODERATE dizziness (e.g., interferes with normal activities) AND [2] has NOT been evaluated by doctor (or NP/PA) for this  (Exception: Dizziness caused by heat exposure, sudden standing, or poor fluid intake.)  Answer Assessment - Initial Assessment Questions Nurse attempted to call patient 3x, no answer, left vm, asking patient to return call. Due to dizziness, patient should not drive.  Nurse advised UC/ED. nurse asked patient if she had anyone to take to UC, patient reports she drives. Nurse placed patient on HOLD to cal CAL, regarding if patient is independent living or in senior care. Spoke with Jasmine, informed patient independent living. Staff stated patient was not seen yesterday. Phone line disconnected.    Patient reports: Seen MD yesterday, reports 1 ear clear and other full of wax 1 ear and other ear full of wax, used solution and still can't get wax out, Nasalcort, decongestant tablets  Headache; frontal, entire head hurts, not severe, just not going away.  Balance; when stand up and walk, I feel dizzy  Denies nasal drainage or phlegm, no fever  Denies issues with eating/ drinking, no bowel and bladder    1. LOCATION: Where does it hurt?      Ears, frontal HA 2. ONSET: When did the sinus pain start?  (e.g., hours, days)      2 days ago 3. SEVERITY: How bad is the pain?   (Scale 0-10; or none, mild, moderate or severe)     5/10 4. RECURRENT SYMPTOM: Have you ever had sinus problems  before? If Yes, ask: When was the last time? and What happened that time?      none 5. NASAL CONGESTION: Is the nose blocked? If Yes, ask: Can you open it or must you breathe through your mouth?     none 6. NASAL DISCHARGE: Do you have discharge from your nose? If so ask, What color?     none 7. FEVER: Do you have a fever? If Yes, ask: What is it, how was it measured, and when did it start?      no 8. OTHER SYMPTOMS: Do you have any other symptoms? (e.g., sore throat, cough, earache, difficulty breathing)  Protocols used: Sinus Pain or Congestion-A-AH, Dizziness - Lightheadedness-A-AH

## 2024-02-18 NOTE — Telephone Encounter (Signed)
 Patient called back after the previous call was disconnected. Patient is requesting a referral to an ENT or to see if an appointment could be made to be seen. Patient does not want to go to urgent care or the ED for treatment. Please advise.         FYI Only or Action Required?: Action required by provider: request for appointment and referral request.  Patient was last seen in primary care on 01/08/2024 by Abdul Fine, MD.  Called Nurse Triage reporting Sinusitis.  Symptoms began 2 days ago.  Symptoms are: gradually worsening.  Triage Disposition: See Physician Within 24 Hours  Patient/caregiver understands and will follow disposition?: No, wishes to speak with PCP

## 2024-02-19 DIAGNOSIS — H6993 Unspecified Eustachian tube disorder, bilateral: Secondary | ICD-10-CM | POA: Diagnosis not present

## 2024-02-19 DIAGNOSIS — H9193 Unspecified hearing loss, bilateral: Secondary | ICD-10-CM | POA: Diagnosis not present

## 2024-02-22 NOTE — Telephone Encounter (Signed)
Tried calling patient, LMOM to return call.

## 2024-02-22 NOTE — Telephone Encounter (Signed)
 Patient scheduled herself an appointment with [facility] for Wednesday, does not need an appointment with us .

## 2024-02-22 NOTE — Telephone Encounter (Signed)
Please schedule patient for a visit

## 2024-02-23 ENCOUNTER — Other Ambulatory Visit: Payer: Self-pay

## 2024-02-23 DIAGNOSIS — M1611 Unilateral primary osteoarthritis, right hip: Secondary | ICD-10-CM

## 2024-02-24 ENCOUNTER — Emergency Department

## 2024-02-24 ENCOUNTER — Encounter: Payer: Self-pay | Admitting: Emergency Medicine

## 2024-02-24 ENCOUNTER — Other Ambulatory Visit: Payer: Self-pay

## 2024-02-24 ENCOUNTER — Emergency Department
Admission: EM | Admit: 2024-02-24 | Discharge: 2024-02-24 | Disposition: A | Discharge status: Home / Self Care | Attending: Emergency Medicine | Admitting: Emergency Medicine

## 2024-02-24 DIAGNOSIS — R9089 Other abnormal findings on diagnostic imaging of central nervous system: Secondary | ICD-10-CM | POA: Diagnosis not present

## 2024-02-24 DIAGNOSIS — H903 Sensorineural hearing loss, bilateral: Secondary | ICD-10-CM | POA: Diagnosis not present

## 2024-02-24 DIAGNOSIS — G501 Atypical facial pain: Secondary | ICD-10-CM | POA: Diagnosis not present

## 2024-02-24 DIAGNOSIS — R42 Dizziness and giddiness: Secondary | ICD-10-CM | POA: Insufficient documentation

## 2024-02-24 DIAGNOSIS — M26639 Articular disc disorder of temporomandibular joint, unspecified side: Secondary | ICD-10-CM | POA: Diagnosis not present

## 2024-02-24 DIAGNOSIS — D320 Benign neoplasm of cerebral meninges: Secondary | ICD-10-CM | POA: Diagnosis not present

## 2024-02-24 DIAGNOSIS — D1809 Hemangioma of other sites: Secondary | ICD-10-CM | POA: Diagnosis not present

## 2024-02-24 DIAGNOSIS — R200 Anesthesia of skin: Secondary | ICD-10-CM | POA: Diagnosis not present

## 2024-02-24 DIAGNOSIS — M5001 Cervical disc disorder with myelopathy,  high cervical region: Secondary | ICD-10-CM | POA: Diagnosis not present

## 2024-02-24 DIAGNOSIS — I509 Heart failure, unspecified: Secondary | ICD-10-CM | POA: Insufficient documentation

## 2024-02-24 DIAGNOSIS — R29818 Other symptoms and signs involving the nervous system: Secondary | ICD-10-CM | POA: Diagnosis not present

## 2024-02-24 DIAGNOSIS — M47812 Spondylosis without myelopathy or radiculopathy, cervical region: Secondary | ICD-10-CM | POA: Diagnosis not present

## 2024-02-24 DIAGNOSIS — M4802 Spinal stenosis, cervical region: Secondary | ICD-10-CM | POA: Diagnosis not present

## 2024-02-24 DIAGNOSIS — I10 Essential (primary) hypertension: Secondary | ICD-10-CM | POA: Diagnosis not present

## 2024-02-24 DIAGNOSIS — R202 Paresthesia of skin: Secondary | ICD-10-CM | POA: Diagnosis not present

## 2024-02-24 LAB — COMPREHENSIVE METABOLIC PANEL WITH GFR
ALT: 20 U/L (ref 0–44)
AST: 20 U/L (ref 15–41)
Albumin: 4 g/dL (ref 3.5–5.0)
Alkaline Phosphatase: 86 U/L (ref 38–126)
Anion gap: 12 (ref 5–15)
BUN: 13 mg/dL (ref 8–23)
CO2: 25 mmol/L (ref 22–32)
Calcium: 9 mg/dL (ref 8.9–10.3)
Chloride: 102 mmol/L (ref 98–111)
Creatinine, Ser: 0.68 mg/dL (ref 0.44–1.00)
GFR, Estimated: >60 mL/min (ref >60)
Glucose, Bld: 114 mg/dL — ABNORMAL HIGH (ref 70–99)
Potassium: 3.3 mmol/L — ABNORMAL LOW (ref 3.5–5.1)
Sodium: 139 mmol/L (ref 135–145)
Total Bilirubin: 0.6 mg/dL (ref 0.0–1.2)
Total Protein: 6.8 g/dL (ref 6.5–8.1)

## 2024-02-24 LAB — CBC
HCT: 39 % (ref 36.0–46.0)
Hemoglobin: 12.1 g/dL (ref 12.0–15.0)
MCH: 26.7 pg (ref 26.0–34.0)
MCHC: 31 g/dL (ref 30.0–36.0)
MCV: 85.9 fL (ref 80.0–100.0)
Platelets: 420 K/uL — ABNORMAL HIGH (ref 150–400)
RBC: 4.54 MIL/uL (ref 3.87–5.11)
RDW: 15.1 % (ref 11.5–15.5)
WBC: 9.9 K/uL (ref 4.0–10.5)
nRBC: 0 % (ref 0.0–0.2)

## 2024-02-24 NOTE — ED Provider Notes (Signed)
 [facility] Provider Note    Event Date/Time   First MD Initiated Contact with Patient 02/24/24 1258     (approximate)   History   Numbness and Dizziness   HPI  Wendy Murray is a 88 y.o. female with a history of TIA, CHF, balance issues who presents with complaints of tingling in her feet bilaterally and occasionally in her hands bilaterally.  Sent from ENT for evaluation, had CT scan which demonstrated mild sinus disease, she had been told this may be related to her sinuses.  She is not diabetic.  No new medications.  Foot symptoms have been going on for a week and are constant, hand tingling is quite intermittent     Physical Exam   Triage Vital Signs: ED Triage Vitals  Encounter Vitals Group     BP 02/24/24 1045 (!) 163/117     Girls Systolic BP Percentile --      Girls Diastolic BP Percentile --      Boys Systolic BP Percentile --      Boys Diastolic BP Percentile --      Pulse Rate 02/24/24 1045 91     Resp 02/24/24 1045 18     Temp 02/24/24 1045 98 F (36.7 C)     Temp Source 02/24/24 1045 Oral     SpO2 02/24/24 1045 100 %     Weight 02/24/24 1043 71.7 kg (158 lb)     Height 02/24/24 1043 1.651 m (5' 5)     Head Circumference --      Peak Flow --      Pain Score 02/24/24 1043 4     Pain Loc --      Pain Education --      Exclude from Growth Chart --     Most recent vital signs: Vitals:   02/24/24 1340 02/24/24 1529  BP: (!) 171/87 (!) 165/88  Pulse: 75 72  Resp: 18 18  Temp:    SpO2: 95% 100%     General: Awake, no distress.  CV:  Good peripheral perfusion.  Resp:  Normal effort.  Abd:  No distention.  Other:  Cranial nerves II through XII are normal, normal strength in all extremities, extremities warm and well-perfused   ED Results / Procedures / Treatments   Labs (all labs ordered are listed, but only abnormal results are displayed) Labs Reviewed  COMPREHENSIVE METABOLIC PANEL WITH GFR - Abnormal; Notable  for the following components:      Result Value   Potassium 3.3 (*)    Glucose, Bld 114 (*)    All other components within normal limits  CBC - Abnormal; Notable for the following components:   Platelets 420 (*)    All other components within normal limits  URINALYSIS, ROUTINE W REFLEX MICROSCOPIC  CBG MONITORING, ED     EKG  ED ECG REPORT I, Lamar Price, the attending physician, personally viewed and interpreted this ECG.  Date: 02/24/2024  Rhythm: normal sinus rhythm QRS Axis: normal Intervals: normal ST/T Wave abnormalities: normal Narrative Interpretation: no evidence of acute ischemia    RADIOLOGY     PROCEDURES:  Critical Care performed:   Procedures   MEDICATIONS ORDERED IN ED: Medications - No data to display   IMPRESSION / MDM / ASSESSMENT AND PLAN / ED COURSE  I reviewed the triage vital signs and the nursing notes. Patient's presentation is most consistent with acute presentation with potential threat to life or bodily function.  Patient presents with tingling/neuropathy symptoms as above, this is new x 1 week and bilateral in the lower extremities, no pain, no trauma, no new medications, no history of electrolyte abnormalities  Lab work reviewed and is reassuring  Will send for MRI of the brain and cervical spine  MRI brain and cervical spine without acute abnormalities, patient is reassured by this, she will need further follow-up with neurology to evaluate paresthesias, no indication for admission at this time which she agrees with.      FINAL CLINICAL IMPRESSION(S) / ED DIAGNOSES   Final diagnoses:  Paresthesia     Rx / DC Orders   ED Discharge Orders     None        Note:  This document was prepared using Dragon voice recognition software and may include unintentional dictation errors.   Arlander Charleston, MD 02/24/24 [phone]

## 2024-02-24 NOTE — ED Notes (Signed)
Pt encouraged to give a urine sample.  

## 2024-02-24 NOTE — ED Triage Notes (Signed)
 Pt via POV from home. ENT doctor this morning, pt c/o bilateral hand numbness and feet numbness that has been going on since last Thursday. Reports that ENT doc said nothing concerning inner ear issues and states that she had a CT today that was negative. ENT doc sent pt over for an MRI. Pt is A&Ox4 and NAD

## 2024-02-25 ENCOUNTER — Ambulatory Visit: Admitting: Orthopedic Surgery

## 2024-02-25 ENCOUNTER — Encounter: Payer: Self-pay | Admitting: Student

## 2024-02-25 ENCOUNTER — Encounter: Payer: Self-pay | Admitting: Orthopedic Surgery

## 2024-02-25 ENCOUNTER — Ambulatory Visit
Admission: RE | Admit: 2024-02-25 | Discharge: 2024-02-25 | Disposition: A | Discharge status: Home / Self Care | Attending: Orthopedic Surgery | Admitting: Orthopedic Surgery

## 2024-02-25 ENCOUNTER — Ambulatory Visit
Admission: RE | Admit: 2024-02-25 | Discharge: 2024-02-25 | Disposition: A | Discharge status: Home / Self Care | Source: Ambulatory Visit | Attending: Orthopedic Surgery | Admitting: Orthopedic Surgery

## 2024-02-25 DIAGNOSIS — M1611 Unilateral primary osteoarthritis, right hip: Secondary | ICD-10-CM

## 2024-02-25 DIAGNOSIS — T8130XA Disruption of wound, unspecified, initial encounter: Secondary | ICD-10-CM

## 2024-02-25 DIAGNOSIS — Z96641 Presence of right artificial hip joint: Secondary | ICD-10-CM | POA: Diagnosis not present

## 2024-02-25 NOTE — Progress Notes (Signed)
 Orthopaedic Postop Note  Assessment: Wendy Murray is a 88 y.o. female s/p Right total hip arthroplasty, anterior approach  DOS: 12/09/23  Plan: Wendy Murray is recovering well following right hip replacement.  She has no pain in the groin.  Wound check today demonstrates that the area of wound breakdown is improving.  It is decreased by at least 50%.  There is some healthy appearing granulation tissue.  Continue with current wound care, including manuka honey.  Keep the wound covered.  I would like see her back in 2 weeks.  At this point, we do not need to consider a referral, or further consideration for a wound care consult.   Follow-up: Return in about 2 weeks (around 03/10/2024). XR at next visit: AP pelvis and Right hip  Subjective:  Chief Complaint  Patient presents with   Postop    Right hip arthroplasty; date of surgery 12/09/2023  Wound breakdown    History of Present Illness: Wendy Murray is a 88 y.o. female who presents following the above stated procedure.  Surgery is almost 3 months ago.  She has done very well.  She is ambulating with a cane.  Unfortunately, she has developed wound breakdown.  She is most recently treating the wound breakdown lateral to the incision with manuka honey.  She feels as though it is much better.  No fevers or chills.  No drainage.  She has developed some issues with vertigo, and has recently started to develop some tingling sensations in bilateral feet.  She is being evaluated by neurology.   Review of Systems: No fevers or chills No numbness or tingling No Chest Pain No shortness of breath   Objective: There were no vitals taken for this visit.  Physical Exam:  Alert and oriented.  No acute distress  Anterior right hip surgical incision is healed, with the exception of some breakdown, just lateral to the distal portion.  There is now an area of soft eschar, without purulence.  Wound edges appear healthy.  She is  ambulating with a cane.  She tolerates gentle range of motion of the right hip.  Wound breakdown has decreased by at least 50%.  There is healthy appearing granulation tissue.  No redness.  No drainage.  IMAGING: I personally ordered and reviewed the following images:  No new imaging obtained today.  Oneil DELENA Horde, MD 02/25/2024 12:17 PM

## 2024-02-25 NOTE — Patient Instructions (Addendum)
 Continue with regular dressing changes  Follow up in 2 weeks for wound check

## 2024-02-29 ENCOUNTER — Ambulatory Visit: Payer: Self-pay

## 2024-02-29 NOTE — Telephone Encounter (Signed)
 FYI Only or Action Required?: FYI only for provider.  Patient was last seen in primary care on 01/08/2024 by Abdul Fine, MD.  Called Nurse Triage reporting Dizziness and Tingling.  Symptoms began several weeks ago.  Interventions attempted: Prescription medications: prednisone  and Rest, hydration, or home remedies.  Symptoms are: gradually worsening.  Triage Disposition: See PCP Within 2 Weeks (overriding See PCP When Office is Open (Within 3 Days))  Patient/caregiver understands and will follow disposition?: Yes                             Copied from CRM [id number]. Topic: Clinical - Red Word Triage >> Feb 29, 2024 10:50 AM Alfonso ORN wrote: Red Word that prompted transfer to Nurse Triage: been to Ear specialist and hospital and hospital took blood test and  diagnois of high blood sugar  pt. has  tingling in feet and dizzy Reason for Disposition  [1] MODERATE dizziness (e.g., interferes with normal activities) AND [2] has been evaluated by doctor (or NP/PA) for this  Answer Assessment - Initial Assessment Questions 1. DESCRIPTION: Describe your dizziness.     Describes fullness in head, states she feels out of it and her senses are impaired  3. VERTIGO: Do you feel like either you or the room is spinning or tilting? (i.e., vertigo)     Denies 4. SEVERITY: How bad is it?  Do you feel like you are going to faint? Can you stand and walk?     States she can still walk around, but needs to hold onto objects for support at times  5. ONSET:  When did the dizziness begin?     Weeks to months ago, went to ED for symptoms and was discharged with no imaging findings 6. AGGRAVATING FACTORS: Does anything make it worse? (e.g., standing, change in head position)     Denies 8. CAUSE: What do you think is causing the dizziness? (e.g., decreased fluids or food, diarrhea, emotional distress, heat exposure, new medicine, sudden standing, vomiting;  unknown)     Unsure  9. RECURRENT SYMPTOM: Have you had dizziness before? If Yes, ask: When was the last time? What happened that time?     States feeling off balance and earaches have been going on for about a year 10. OTHER SYMPTOMS: Do you have any other symptoms? (e.g., fever, chest pain, vomiting, diarrhea, bleeding)     Blurred vision that started 4-5 days ago- states she recently got new lenses, occasional mild earache, tingling (pins and needles) in bilateral feet- occurring since June, hearing changes, denies changes to speech, denies one-sided weakness, denies facial drooping, denies chest pain, denies difficulty breathing, denies fever, denies cold/flu symptoms    Patient stated symptoms have been ongoing for weeks to months, but are worsening. Patient went to ED for symptoms on 02/24/24 and imaging showed no findings. Patient was audibly concerned and stated she only wanted to see PCP. This RN scheduled patient for first availability with PCP on Friday of this week. This RN advised patient to call back if symptoms worsen. Patient verbalized understanding.  Protocols used: Dizziness - Lightheadedness-A-AH

## 2024-02-29 NOTE — Telephone Encounter (Signed)
 Dr.Beamer is out of office will send to covering provider

## 2024-02-29 NOTE — Telephone Encounter (Signed)
 Noted

## 2024-03-03 ENCOUNTER — Non-Acute Institutional Stay: Admitting: Nurse Practitioner

## 2024-03-03 ENCOUNTER — Encounter: Payer: Self-pay | Admitting: Nurse Practitioner

## 2024-03-03 VITALS — BP 154/80 | HR 91 | Temp 97.6°F | Ht 65.0 in | Wt 157.4 lb

## 2024-03-03 DIAGNOSIS — R2689 Other abnormalities of gait and mobility: Secondary | ICD-10-CM

## 2024-03-03 DIAGNOSIS — I10 Essential (primary) hypertension: Secondary | ICD-10-CM | POA: Diagnosis not present

## 2024-03-03 DIAGNOSIS — R634 Abnormal weight loss: Secondary | ICD-10-CM | POA: Diagnosis not present

## 2024-03-03 DIAGNOSIS — K580 Irritable bowel syndrome with diarrhea: Secondary | ICD-10-CM

## 2024-03-03 DIAGNOSIS — R202 Paresthesia of skin: Secondary | ICD-10-CM

## 2024-03-03 DIAGNOSIS — E039 Hypothyroidism, unspecified: Secondary | ICD-10-CM

## 2024-03-03 NOTE — Progress Notes (Signed)
 Careteam: Patient Care Team: Abdul Fine, MD as PCP - General (Family Medicine) [facility], [facility] (Optometry) PLACE OF SERVICE:  [facility]   Advanced Directive information    No Known Allergies  Chief Complaint  Patient presents with   Headache    Headache comes and goes. Dizziness. and Tingling in Feet moving up into the knees. Symptoms started a couple of weeks ago.  Went to Urgent care last Friday and saw an ENT which sent her to the ER.  Has a Neurologist appointment on 04/06/2024 ENT appointment Next Wednesday.      HPI: Patient is a 88 y.o. female seen in today to follow up ED and ENT consult.  Discussed the use of AI scribe software for clinical note transcription with the patient, who gave verbal consent to proceed.  History of Present Illness Wendy Murray is an 88 year old female who presents with distorted hearing, balance issues, and tingling in the legs. She has gone to the ENT, ED and has neurology evaluation scheduled.   She experiences distorted hearing, where sounds, especially music, are perceived as grating and unbearable. This symptom has been distressing, particularly when she was on the phone with a doctor's office and could not tolerate the music being played. ENT could not find a reason for this.  She describes a tingling sensation in her feet that began two to three weeks ago and has progressed to her knees as of this morning. Her balance is poor, feeling as if she is 'walking on foam rubber,' and she is unsteady, requiring support from walls and furniture when moving around her house. No pain is associated with the tingling. No falls reported.  She reports that she has had a CT scan and an MRI, and was told that nothing showed up on any of the tests. She recalls that her ENT told her that her sinuses and ears were clear. She has a follow-up appointment with the ENT next Wednesday and a neurology appointment scheduled for  October 22nd.  She mentions a high blood sugar level (114) noted during a hospital visit, although she had not eaten for several hours prior to the test. Her A1c was 5.6 in July, which is normal. She also reports low potassium levels from the same blood work.  She has a history of IBS, which affects her eating habits, leading her to skip lunch and eat only breakfast and an early dinner. She has lost some weight recently and is cautious about her diet due to IBS.  She recalls a recent episode of TM joint swelling and difficulty chewing, which resolved after a course of prednisone  prescribed by the ENT. She occasionally hears a soft clicking sound in her ear, unrelated to jaw movement.  She has not been checking her blood pressure at home. She takes her blood pressure medication at night, between 5 and 7 PM.     Review of Systems:  Review of Systems  Constitutional:  Positive for weight loss. Negative for chills and fever.  HENT:  Negative for congestion, sinus pain, sore throat and tinnitus.   Respiratory:  Negative for cough, sputum production and shortness of breath.   Cardiovascular:  Negative for chest pain, palpitations and leg swelling.  Gastrointestinal:  Negative for abdominal pain, constipation, diarrhea and heartburn.  Genitourinary:  Negative for dysuria, frequency and urgency.  Musculoskeletal:  Negative for back pain, falls, joint pain and myalgias.  Skin: Negative.   Neurological:  Positive  for sensory change and headaches. Negative for dizziness.  Psychiatric/Behavioral:  Negative for depression and memory loss. The patient does not have insomnia.     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   a.) presurgical PCR (+) 12/02/2023 prior to RIGHT THA   Orthostatic dizziness 05/05/2023   Osteoarthritis, knee    Recurrent major depression (HCC)    Sensory hearing loss, bilateral 07/13/2018   TIA (transient ischemic attack) 01/2021   Past Surgical History:  Procedure Laterality Date   ABDOMINAL HYSTERECTOMY  06/16/1989   CATARACT EXTRACTION W/ INTRAOCULAR LENS  IMPLANT, BILATERAL  06/16/2012   CHOLECYSTECTOMY  06/16/1996   COLONOSCOPY     DEXA  08/15/2007   Normal   HAMMER TOE SURGERY Left 10/15/2014   Dr Christine   LUMBAR FUSION  06/17/1991   MENISCUS REPAIR Left 04/16/2010   PARATHYROIDECTOMY Left 06/25/2023   Procedure: NECK EXPLORATION WITH LEFT SUPERIOR PARATHYROIDECTOMY;  Surgeon: Eletha Boas, MD;  Location: [facility];  Service: General;  Laterality: Left;   PARATHYROIDECTOMY  06/25/2023   TOTAL HIP ARTHROPLASTY Right 12/09/2023   Procedure: ARTHROPLASTY, HIP, TOTAL, ANTERIOR APPROACH;  Surgeon: Onesimo Oneil LABOR, MD;  Location: [facility] OR[facility];  Service: Orthopedics;  Laterality: Right;   TOTAL KNEE ARTHROPLASTY Left 06/16/2010   TOTAL KNEE ARTHROPLASTY Right 07/09/2021   Procedure: TOTAL KNEE ARTHROPLASTY;  Surgeon: Edie Norleen PARAS, MD;  Location: [facility] OR[facility];  Service: Orthopedics;  Laterality: Right;   WRIST SURGERY Bilateral    Social History:   reports that she quit smoking about 42 years ago. Her smoking use included cigarettes. She has been exposed to tobacco smoke. She has never used smokeless tobacco. She reports that she does not currently use alcohol. She reports that she does not use drugs.  Family History  Problem Relation Age of Onset   Cancer Mother        colon   Depression Mother    Cancer Father    Depression Sister    Hypertension Neg Hx    Heart disease Neg Hx    Diabetes Neg  Hx     Medications: Patient's Medications  New Prescriptions   No medications on file  Previous Medications   ACETAMINOPHEN  (TYLENOL ) 325 MG TABLET    Take 650 mg by mouth every 6 (six) hours as needed.   ATORVASTATIN  (LIPITOR) 40 MG TABLET    Take 1 tablet (40 mg total) by mouth every evening.   CHOLECALCIFEROL  (VITAMIN D3) 25 MCG (1000 UNIT) TABLET    Take 1,000 Units by mouth daily.   FERROUS SULFATE  DRIED (SLOW RELEASE IRON) 45 MG TBCR    Take 45 mg by mouth daily.   FOLIC ACID (FOLVITE) 400 MCG TABLET    Take 400 mcg by mouth daily.   LEVOTHYROXINE  (SYNTHROID ) 100 MCG TABLET    Take 1 tablet (100 mcg total) by mouth daily.   MIRTAZAPINE  (REMERON ) 15 MG TABLET    Take 1 tablet (15 mg total) by  mouth at bedtime.   OMEPRAZOLE  (PRILOSEC) 20 MG CAPSULE    Take 1 capsule (20 mg total) by mouth every evening.   PROBIOTIC PRODUCT (ALIGN) 4 MG CAPS    Take 4 mg by mouth at bedtime.   VALSARTAN  (DIOVAN ) 160 MG TABLET    Take 1 tablet (160 mg total) by mouth daily.   VENLAFAXINE  XR (EFFEXOR  XR) 37.5 MG 24 HR CAPSULE    Take 1 capsule (37.5 mg total) by mouth daily with breakfast.  Modified Medications   No medications on file  Discontinued Medications   No medications on file    Physical Exam:  Vitals:   03/03/24 0959 03/03/24 1000  BP: (!) 162/84 (!) 154/80  Pulse: 91   Temp: 97.6 F (36.4 C)   SpO2: 97%   Weight: 157 lb 6.4 oz (71.4 kg)   Height: 5' 5 (1.651 m)    Body mass index is 26.19 kg/m. Wt Readings from Last 3 Encounters:  03/03/24 157 lb 6.4 oz (71.4 kg)  02/24/24 158 lb (71.7 kg)  01/08/24 162 lb 12.8 oz (73.8 kg)    Physical Exam Constitutional:      General: She is not in acute distress.    Appearance: She is well-developed. She is not diaphoretic.  HENT:     Head: Normocephalic and atraumatic.     Mouth/Throat:     Pharynx: No oropharyngeal exudate.  Eyes:     Conjunctiva/sclera: Conjunctivae normal.     Pupils: Pupils are equal, round, and reactive to  light.  Cardiovascular:     Rate and Rhythm: Normal rate and regular rhythm.     Heart sounds: Normal heart sounds.  Pulmonary:     Effort: Pulmonary effort is normal.     Breath sounds: Normal breath sounds.  Abdominal:     General: Bowel sounds are normal.     Palpations: Abdomen is soft.  Musculoskeletal:     Cervical back: Normal range of motion and neck supple.     Right lower leg: No edema.     Left lower leg: No edema.  Skin:    General: Skin is warm and dry.  Neurological:     Mental Status: She is alert.  Psychiatric:        Mood and Affect: Mood normal.     Labs reviewed: Basic Metabolic Panel: Recent Labs    03/10/23 1318 06/16/23 1327 07/13/23 0804 09/07/23 0740 12/02/23 1101 01/11/24 0800 02/24/24 1048  NA 141   < > 140  --  139 139 139  K 4.7   < > 3.9  --  3.8 4.4 3.3*  CL 104   < > 105  --  105 105 102  CO2 29   < > 27  --  24 26 25   GLUCOSE 110*   < > 92  --  106* 101* 114*  BUN 18   < > 14  --  14 15 13   CREATININE 0.91   < > 0.69  --  0.58 0.92 0.68  CALCIUM  10.7*   < > 8.9  8.9  --  8.8* 9.1 9.0  PHOS 3.4  --   --   --   --   --   --   TSH 2.13  --  7.11* 3.42  --   --   --    < > = values in this interval not displayed.   Liver Function Tests: Recent Labs    03/10/23 1318 07/13/23 0804  01/11/24 0800 02/24/24 1048  AST  --  18 17 20   ALT  --  19 16 20   ALKPHOS  --   --   --  86  BILITOT  --  0.4 0.3 0.6  PROT  --  6.4 6.5 6.8  ALBUMIN 4.1  --   --  4.0   No results for input(s): LIPASE, AMYLASE in the last 8760 hours. No results for input(s): AMMONIA in the last 8760 hours. CBC: Recent Labs    07/13/23 0804 12/02/23 1101 12/09/23 1051 12/15/23 0000 01/11/24 0800 02/24/24 1048  WBC 9.1 8.6  --  9.4 6.3 9.9  NEUTROABS 5,788  --   --  6,270.00 3,383  --   HGB 12.4 12.1   < > 8.9* 11.2* 12.1  HCT 39.0 38.0   < > 28* 36.8 39.0  MCV 85.3 84.6  --   --  87.6 85.9  PLT 599* 386  --  554* 439* 420*   < > = values in this  interval not displayed.   Lipid Panel: Recent Labs    01/11/24 0800  CHOL 140  HDL 46*  LDLCALC 74  TRIG 887  CHOLHDL 3.0   TSH: Recent Labs    03/10/23 1318 07/13/23 0804 09/07/23 0740  TSH 2.13 7.11* 3.42   A1C: Lab Results  Component Value Date   HGBA1C 5.6 01/11/2024   Paresthesia   Assessment/Plan Assessment and Plan Assessment & Plan Balance disturbance and gait instability with lower extremity paresthesia and abnormal auditory perceptions Reports tingling in feet progressing to knees, balance issues, and distorted auditory perceptions. MRI indicated moderate chronic microangiopathic ischemic changes and brain volume loss. Differential includes idiopathic neuropathy. No pain reported. Prednisone  course did not exacerbate symptoms. Neurology appointment scheduled for October 22nd. - Order physical therapy referral for balance assessment and management. - Order blood work to check B12 levels and inflammatory markers. - Advise to monitor blood pressure at home.  Unintentional weight loss Experienced some weight loss. Eating pattern may contribute to weight loss. - Advise to increase meal frequency to three meals a day.  Irritable bowel syndrome Controls eating due to IBS, impacting meal frequency. Currently eating two meals a day. - Advise to aim for three meals a day to ensure adequate protein and caloric intake.  Hypertension Blood pressure slightly elevated today. Not currently monitoring blood pressure at home. Takes blood pressure medication at night. - Advise to monitor blood pressure at home after taking medication in the evening.   Keep neurology and ENT  Next appt: to keep follow up as scheduled in 6 weeks.  Sooner if needed.  Sonya Pucci K. Caro BODILY  [facility] [phone]

## 2024-03-04 ENCOUNTER — Encounter: Admitting: Student

## 2024-03-04 ENCOUNTER — Other Ambulatory Visit: Payer: Self-pay

## 2024-03-04 DIAGNOSIS — M1611 Unilateral primary osteoarthritis, right hip: Secondary | ICD-10-CM

## 2024-03-07 DIAGNOSIS — R202 Paresthesia of skin: Secondary | ICD-10-CM | POA: Diagnosis not present

## 2024-03-07 DIAGNOSIS — R2689 Other abnormalities of gait and mobility: Secondary | ICD-10-CM | POA: Diagnosis not present

## 2024-03-08 ENCOUNTER — Ambulatory Visit: Payer: Self-pay | Admitting: Nurse Practitioner

## 2024-03-08 ENCOUNTER — Emergency Department
Admission: EM | Admit: 2024-03-08 | Discharge: 2024-03-08 | Disposition: A | Discharge status: Home / Self Care | Attending: Emergency Medicine | Admitting: Emergency Medicine

## 2024-03-08 ENCOUNTER — Other Ambulatory Visit: Payer: Self-pay

## 2024-03-08 ENCOUNTER — Ambulatory Visit: Payer: Self-pay

## 2024-03-08 DIAGNOSIS — F419 Anxiety disorder, unspecified: Secondary | ICD-10-CM | POA: Diagnosis not present

## 2024-03-08 DIAGNOSIS — F29 Unspecified psychosis not due to a substance or known physiological condition: Secondary | ICD-10-CM | POA: Diagnosis not present

## 2024-03-08 DIAGNOSIS — I509 Heart failure, unspecified: Secondary | ICD-10-CM | POA: Diagnosis not present

## 2024-03-08 DIAGNOSIS — I110 Hypertensive heart disease with heart failure: Secondary | ICD-10-CM | POA: Insufficient documentation

## 2024-03-08 DIAGNOSIS — T50905A Adverse effect of unspecified drugs, medicaments and biological substances, initial encounter: Secondary | ICD-10-CM

## 2024-03-08 DIAGNOSIS — R457 State of emotional shock and stress, unspecified: Secondary | ICD-10-CM | POA: Diagnosis not present

## 2024-03-08 MED ORDER — VENLAFAXINE HCL ER 75 MG PO CP24: 150.0000 mg | ORAL_CAPSULE | Freq: Every day | ORAL | 2 refills | Status: DC

## 2024-03-08 NOTE — ED Triage Notes (Signed)
 First nurse note: Pt reports increased anxiety x3 wks. Pt normally takes Effexor . Pt is being titrated off. Pt also reports possible inner ear problem. Pt has appointment with ENT tomorrow. Pt ambulatory with cane.   152/80 HR 100 95% RA

## 2024-03-08 NOTE — Discharge Instructions (Addendum)
 As we discussed please follow-up with a primary care provider to discuss your current medications and which medication should be continued or discontinued.  Please begin taking your Effexor  75 mg daily instead of 37.5.  Return to the emergency department for any symptoms personally concerning to yourself.  Please see the list of geriatricians provided below: Dr. Ronal Cypress MD [phone] Mliss Shine NP [phone]

## 2024-03-08 NOTE — Telephone Encounter (Signed)
Message routed to PCP Beamer, Victoria, MD  

## 2024-03-08 NOTE — ED Provider Notes (Signed)
 [facility] Provider Note    Event Date/Time   First MD Initiated Contact with Patient 03/08/24 1705     (approximate)  History   Chief Complaint: Anxiety  HPI  Wendy Murray is a 88 y.o. female with a past medical history anxiety, CHF, hypertension, hyperlipidemia, presents to the emergency department for worsening anxiety.  According to the patient for the past 3 to 4 weeks now she has been experiencing worsening anxiety symptoms.  Patient states she has been feeling like she is on high alert.  States she has not been sleeping very well recently.  In speaking to the patient it sounds like the patient has been titrated off of Klonopin  approximately 4 to 6 weeks ago.  She is also currently titrating off of Effexor .  Patient started at 225 mg daily and has been titrating down per her PCP.  Patient recently decreased from 75 mg to 37.5 mg approximately 4 weeks ago.  Physical Exam   Triage Vital Signs: ED Triage Vitals  Encounter Vitals Group     BP 03/08/24 1455 139/77     Girls Systolic BP Percentile --      Girls Diastolic BP Percentile --      Boys Systolic BP Percentile --      Boys Diastolic BP Percentile --      Pulse Rate 03/08/24 1455 92     Resp 03/08/24 1455 17     Temp 03/08/24 1455 98 F (36.7 C)     Temp Source 03/08/24 1455 Oral     SpO2 03/08/24 1455 96 %     Weight 03/08/24 1456 153 lb (69.4 kg)     Height 03/08/24 1456 5' 5 (1.651 m)     Head Circumference --      Peak Flow --      Pain Score 03/08/24 1456 0     Pain Loc --      Pain Education --      Exclude from Growth Chart --     Most recent vital signs: Vitals:   03/08/24 1455  BP: 139/77  Pulse: 92  Resp: 17  Temp: 98 F (36.7 C)  SpO2: 96%    General: Awake, no distress.  CV:  Good peripheral perfusion.   Resp:  Normal effort.  Abd:  No distention.  ED Results / Procedures / Treatments   MEDICATIONS ORDERED IN ED: Medications - No data to  display   IMPRESSION / MDM / ASSESSMENT AND PLAN / ED COURSE  I reviewed the triage vital signs and the nursing notes.  Patient's presentation is most consistent with acute illness / injury with system symptoms.  Patient presents emergency department for worsening anxiety symptoms over the last 3 weeks or so.  In speaking to the patient her doctor has been titrating off of Klonopin  and stopped her Klonopin  6 weeks ago.  Patient states she was taking this for sleep and since stopping she has not been sleeping very well at night.  Patient also states her doctor has been titrating her off of Effexor  last dose adjustment was approximately 4 weeks ago which she went from 75 mg daily down to 37.5 mg.  Shortly after this has been the patient began experiencing the symptoms of significant anxiety stress and decreased sleep.  Suspect this could be related to the patient's Klonopin  or Effexor  or possibly both being titrated down or discontinued.  I believe it would be worth a trial of increasing the  patient's Effexor  back to 75 mg to see if this helps the patient's symptoms.  Patient also is interested in seeing a geriatrician I have provided a few phone numbers for the patient to follow-up with.  Patient has no other concerning symptoms.  No chest pain no abdominal pain reassuring vital signs.  Will discharge with outpatient follow-up.  Patient agreeable to plan of care.  FINAL CLINICAL IMPRESSION(S) / ED DIAGNOSES   Anxiety   Note:  This document was prepared using Dragon voice recognition software and may include unintentional dictation errors.   Dorothyann Drivers, MD 03/08/24 1747

## 2024-03-08 NOTE — ED Triage Notes (Signed)
 Refer to first nurse note.

## 2024-03-08 NOTE — Telephone Encounter (Signed)
 Called [facility] spoke with Darice asked if any immediate appointments at [facility]: staff stated no - disconnected call and called 911 and have them dispatched to pt resident.

## 2024-03-08 NOTE — Telephone Encounter (Signed)
 FYI Only or Action Required?: FYI only for provider.: called 911 for pt  Patient was last seen in primary care on 03/03/2024 by Caro Harlene POUR, NP.  Called Nurse Triage reporting Anxiety.  Symptoms began since June and worsening.  Interventions attempted: Nothing.  Symptoms are: gradually worsening.  Triage Disposition: Call EMS 911 Now  Patient/caregiver understands and will follow disposition?: Yes     Copied from CRM [id number]. Topic: Clinical - Red Word Triage >> Mar 08, 2024  1:23 PM Alfonso ORN wrote: Red Word that prompted transfer to Nurse Triage:  Very urgent  adrenaline rush   and hearing is very distorted Reason for Disposition  SEVERE difficulty breathing (e.g., struggling for each breath, speaks in single words)    Nurse called 911  Answer Assessment - Initial Assessment Questions 1. CONCERN: Did anything happen that prompted you to call today?      Anxiety, hearing distorted - can not hear a combination of sounds 2. ANXIETY SYMPTOMS: Can you describe how you (your loved one; patient) have been feeling? (e.g., tense, restless, panicky, anxious, keyed up, overwhelmed, sense of impending doom).      Anxiety, adreline, just can't settle  3. ONSET: How long have you been feeling this way? (e.g., hours, days, weeks)     Surgery in 11/2023 and has not  4. SEVERITY: How would you rate the level of anxiety? (e.g., 0 - 10; or mild, moderate, severe).     Moderate to severe 5. FUNCTIONAL IMPAIRMENT: How have these feelings affected your ability to do daily activities? Have you had more difficulty than usual doing your normal daily activities? (e.g., getting better, same, worse; self-care, school, work, interactions)     yes 6. HISTORY: Have you felt this way before? Have you ever been diagnosed with an anxiety problem in the past? (e.g., generalized anxiety disorder, panic attacks, PTSD). If Yes, ask: How was this problem treated? (e.g., medicines, counseling,  etc.)     No started in June 7. RISK OF HARM - SUICIDAL IDEATION: Do you ever have thoughts of hurting or killing yourself? If Yes, ask:  Do you have these feelings now? Do you have a plan on how you would do this?     no 8. TREATMENT:  What has been done so far to treat this anxiety? (e.g., medicines, relaxation strategies). What has helped?     no 9. THERAPIST: Do you have a counselor or therapist? If Yes, ask: What is their name?     na 10. POTENTIAL TRIGGERS: Do you drink caffeinated beverages (e.g., coffee, colas, teas), and how much daily? Do you drink alcohol or use any drugs? Have you started any new medicines recently?       no 11. PATIENT SUPPORT: Who is with you now? Who do you live with? Do you have family or friends who you can talk to?        na 12. OTHER SYMPTOMS: Do you have any other symptoms? (e.g., feeling depressed, trouble concentrating, trouble sleeping, trouble breathing, palpitations or fast heartbeat, chest pain, sweating, nausea, or diarrhea)       rouble concentrating, trouble sleeping, trouble speaking at times r/t anxiety 13. PREGNANCY: Is there any chance you are pregnant? When was your last menstrual period?       na   Tingling in feet and now in feet and legs - feels like a bad scare.  No appt available until Friday - pt stated I can not wait that long and  hung up.  Nurse called [facility] for possible appt, then called 911.  Called pt back and talked until police arrived.  Police arrived and called EMS for pt: - taking pt to hospital for evaluation and treatment.  Protocols used: Anxiety and Panic Attack-A-AH

## 2024-03-09 DIAGNOSIS — G501 Atypical facial pain: Secondary | ICD-10-CM | POA: Diagnosis not present

## 2024-03-09 DIAGNOSIS — J301 Allergic rhinitis due to pollen: Secondary | ICD-10-CM | POA: Diagnosis not present

## 2024-03-09 LAB — ANTI-NUCLEAR AB-TITER (ANA TITER): ANA Titer 1: 1:320 {titer} — ABNORMAL HIGH

## 2024-03-09 LAB — SEDIMENTATION RATE: Sed Rate: 14 mm/h (ref 0–30)

## 2024-03-09 LAB — ANA: Anti Nuclear Antibody (ANA): POSITIVE — AB

## 2024-03-09 LAB — VITAMIN B12: Vitamin B-12: 509 pg/mL (ref 200–1100)

## 2024-03-09 LAB — TSH: TSH: 3.42 m[IU]/L (ref 0.40–4.50)

## 2024-03-10 ENCOUNTER — Ambulatory Visit (INDEPENDENT_AMBULATORY_CARE_PROVIDER_SITE_OTHER): Admitting: Orthopedic Surgery

## 2024-03-10 ENCOUNTER — Ambulatory Visit
Admission: RE | Admit: 2024-03-10 | Discharge: 2024-03-10 | Disposition: A | Discharge status: Home / Self Care | Source: Ambulatory Visit | Attending: Orthopedic Surgery | Admitting: Orthopedic Surgery

## 2024-03-10 ENCOUNTER — Encounter: Payer: Self-pay | Admitting: Orthopedic Surgery

## 2024-03-10 ENCOUNTER — Ambulatory Visit
Admission: RE | Admit: 2024-03-10 | Discharge: 2024-03-10 | Disposition: A | Discharge status: Home / Self Care | Attending: Orthopedic Surgery | Admitting: Orthopedic Surgery

## 2024-03-10 DIAGNOSIS — M1611 Unilateral primary osteoarthritis, right hip: Secondary | ICD-10-CM

## 2024-03-10 DIAGNOSIS — Z471 Aftercare following joint replacement surgery: Secondary | ICD-10-CM | POA: Diagnosis not present

## 2024-03-10 DIAGNOSIS — Z96641 Presence of right artificial hip joint: Secondary | ICD-10-CM | POA: Diagnosis not present

## 2024-03-10 NOTE — Progress Notes (Signed)
 Orthopaedic Postop Note  Assessment: Wendy Murray is a 88 y.o. female s/p Right total hip arthroplasty, anterior approach  DOS: 12/09/23  Plan: Wendy Murray feels well following right total hip arthroplasty.  She denies pain in her groin.  She is using a cane to assist with ambulation only because she is having some dizziness secondary to medications.  Otherwise no issues.  Wound breakdown following surgery is improving.  Okay for her to treat with antibiotic ointment or similar.  Keep the area clean.  Band-Aid as needed.  Continue to ambulate to get stronger.  She states understanding.  Follow-up in 3 months   Follow-up: Return in about 3 months (around 06/09/2024). XR at next visit: AP pelvis and Right hip  Subjective:  Chief Complaint  Patient presents with   Postop right hip    Right total hip arthroplasty; anterior approach    History of Present Illness: Wendy Murray is a 88 y.o. female who presents following the above stated procedure.  Surgery was approximately 3 months ago.  She has done well following surgery.  She did have some wound dehiscence and breakdown.  She has been treated this with local wound care.  It is doing much better.  She has no complaints.  No numbness or tingling.  She denies pain in the right groin.   Review of Systems: No fevers or chills No numbness or tingling No Chest Pain No shortness of breath   Objective: There were no vitals taken for this visit.  Physical Exam:  Alert and oriented.  No acute distress  Anterior right hip incision has healed.  No surrounding erythema or drainage.  There is some scabbing and redness in the distal aspect of the incision, which has significantly improved following wound care.  She is able to maintain straight leg raise.  She tolerates gentle range of motion of the right hip.  No pain with axial loading.  IMAGING: I personally ordered and reviewed the following images:  X-rays of the right  hip were obtained prior to clinic today.  No acute injuries.  Well-positioned total hip arthroplasty.  No fractures.  No dislocation.  No periprosthetic lucency.  Oneil DELENA Horde, MD 03/10/2024 2:00 PM

## 2024-03-11 ENCOUNTER — Telehealth: Payer: Self-pay

## 2024-03-11 NOTE — Telephone Encounter (Signed)
 Called and spoke to her. She wants to come back here instead of seeing Dr Abdul at [facility]. She would like to try Dr Bennett. Her first New Pt/TOC appt was 05-04-24. Made her an appt.

## 2024-03-11 NOTE — Telephone Encounter (Signed)
 Copied from CRM [id number]. Topic: Clinical - Medical Advice >> Mar 11, 2024  1:48 PM Rea ORN wrote: Reason for CRM: Pt would like Wendy Murray to call her back regard different health concerns they discussed in the past.  Please call [phone]

## 2024-03-14 ENCOUNTER — Telehealth: Payer: Self-pay | Admitting: Student

## 2024-03-14 NOTE — Telephone Encounter (Signed)
 Please advise....  Copied from CRM #[id number]. Topic: Referral - Question >> Mar 14, 2024 12:30 PM Mercer PEDLAR wrote: Reason for CRM: Patient is calling regarding rheumatologist referral. She stated that she got a call from [facility] from Dr. Isabelle office letting her know that she needs to be seen by rheumatologist but the soonest available with PA Elsie DeFoor is on 04/20/24. Patient is requesting that PCP contact them to see if she is able to be seen sooner or contact patient with alternative options.

## 2024-03-14 NOTE — Telephone Encounter (Signed)
 Recommend having rheumatologist office to add you on call list in case someone cancel appointment due to full schedule.

## 2024-03-21 ENCOUNTER — Ambulatory Visit: Payer: Self-pay

## 2024-03-21 NOTE — Telephone Encounter (Signed)
 FYI Only or Action Required?: Action required by provider: request for appointment.  Patient was last seen in primary care on 03/03/2024 by Wendy Harlene POUR, NP.  Called Nurse Triage reporting Anxiety.  Symptoms began several weeks ago.  Interventions attempted: Prescription medications: antidepressant and Rest, hydration, or home remedies.  Symptoms are: gradually worsening.  Triage Disposition: See PCP Within 2 Weeks  Patient/caregiver understands and will follow disposition?: Yes      Copied from CRM [id number]. Topic: Clinical - Red Word Triage >> Mar 21, 2024  3:24 PM Miquel SAILOR wrote: Red Word that prompted transfer to Nurse Triage: balance issue /tiggling feeling in feet/[place] for 3-4 weeks getting worse       Reason for Disposition  [1] Symptoms of anxiety or panic attack AND [2] is a chronic symptom (recurrent or ongoing AND present > 4 weeks)  Answer Assessment - Initial Assessment Questions First available appointment scheduled for [facility]. Patient would like an earlier appointment if possible, preferably this week. Please advise.      1. CONCERN: Did anything happen that prompted you to call today?      Increased anxiety  2. ANXIETY SYMPTOMS: Can you describe how you (your loved one; patient) have been feeling? (e.g., tense, restless, panicky, anxious, keyed up, overwhelmed, sense of impending doom).      Feels overwhelmed  3. ONSET: How long have you been feeling this way? (e.g., hours, days, weeks)     3 weeks ago 4. SEVERITY: How would you rate the level of anxiety? (e.g., 0 - 10; or mild, moderate, severe).     Moderate to severe  5. FUNCTIONAL IMPAIRMENT: How have these feelings affected your ability to do daily activities? Have you had more difficulty than usual doing your normal daily activities? (e.g., getting better, same, worse; self-care, school, work, interactions)     Makes things harder  6. HISTORY: Have you felt this way before?  Have you ever been diagnosed with an anxiety problem in the past? (e.g., generalized anxiety disorder, panic attacks, PTSD). If Yes, ask: How was this problem treated? (e.g., medicines, counseling, etc.)     Yes, history of anxiety  7. RISK OF HARM - SUICIDAL IDEATION: Do you ever have thoughts of hurting or killing yourself? If Yes, ask:  Do you have these feelings now? Do you have a plan on how you would do this?     No 8. TREATMENT:  What has been done so far to treat this anxiety? (e.g., medicines, relaxation strategies). What has helped?     Has been taking her antidepressant and relaxation strategies  12. OTHER SYMPTOMS: Do you have any other symptoms? (e.g., feeling depressed, trouble concentrating, trouble sleeping, trouble breathing, palpitations or fast heartbeat, chest pain, sweating, nausea, or diarrhea)       Tingling in her feet  Protocols used: Anxiety and Panic Attack-A-AH

## 2024-03-21 NOTE — Telephone Encounter (Signed)
 Noted

## 2024-03-28 ENCOUNTER — Telehealth: Payer: Self-pay | Admitting: Diagnostic Neuroimaging

## 2024-03-28 NOTE — Telephone Encounter (Signed)
 Cancelling appointment due to getting an earlier appointment with a different provider.

## 2024-03-29 ENCOUNTER — Encounter: Payer: Self-pay | Admitting: Nurse Practitioner

## 2024-03-29 ENCOUNTER — Encounter: Admitting: Nurse Practitioner

## 2024-03-30 ENCOUNTER — Non-Acute Institutional Stay: Admitting: Internal Medicine

## 2024-03-30 ENCOUNTER — Encounter: Payer: Self-pay | Admitting: Internal Medicine

## 2024-03-30 VITALS — BP 138/88 | HR 96 | Temp 98.5°F | Ht 65.0 in | Wt 154.8 lb

## 2024-03-30 DIAGNOSIS — G6289 Other specified polyneuropathies: Secondary | ICD-10-CM | POA: Diagnosis not present

## 2024-03-30 DIAGNOSIS — F419 Anxiety disorder, unspecified: Secondary | ICD-10-CM

## 2024-03-30 DIAGNOSIS — H903 Sensorineural hearing loss, bilateral: Secondary | ICD-10-CM

## 2024-03-30 DIAGNOSIS — I10 Essential (primary) hypertension: Secondary | ICD-10-CM

## 2024-03-30 NOTE — Progress Notes (Signed)
 NURSING HOME LOCATION:  [facility].   CODE STATUS: DNR  PCP: Caro Harlene POUR, NP   This is a nursing facility follow up visit for specific acute issue of debilitating anxiety..  Interim medical record and care since last SNF visit was updated with review of diagnostic studies and change in clinical status since last visit were documented.  HPI: She describes increased anxiety for 4 weeks associated with insomnia and disruption of ADLs. She has had anxiety & depressions since her 54s.  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  She has had to stop these activities because she cannot hear the pitch or tone. She states that both parents and her sister had depression.  She felt that her mother was very judgmental toward her. The onset of her symptoms may have been after her brother died in a naval accident when she was 56. Psychiatry was last consulted approximately 4 years ago.   The symptoms have exacerbated since her psychotropic medicines Effexor  and clonazepam  were weaned by her former PCP.  The Effexor  225 mg daily had been titrated down to a final dose of 37.5.  Clonazepam  has been weaned and discontinued.  Since that time she has been in the ED twice.  On 9/10 she was seen for tingling in her feet and occasionally in her hands bilaterally.  She was concerned she may be having a stroke.  At that time she was significantly hypertensive with a blood pressure 163/117.  MRI revealed nonspecific hyperintense foci in the subcortical and periventricular white matter most likely representing chronic microangiopathic ischemic changes associated with generalized parenchymal volume loss. She returned to the emergency room 9/23 because of worsening anxiety.  She stated she felt like she was on high alert.   Sed rate was 14 and TSH therapeutic at 3.42.  Effexor  was increased to 75 mg by the ED  physician. Subsequently ENT was consulted as she could not clear her eustachian tubes.  That Dr. Dr Tresea cannot find etiology for these symptoms.C was performed which was nondiagnostic.  Neurology consultation was scheduled for 10/22 with Dr. Arthea Farrow at the [place] [facility].  Significant past history includes history of congestive heart failure, history of DVT, dyslipidemia, hyperparathyroidism, essential hypertension, hypothyroidism, IBS, history of TIA, and recurrent major depression Rheumatology consultation is being pursued with Dr Soyla Defoor ; she has a positive ANA with a titer of 1-320.  Review of systems: She describes intermittent generalized headache.  The tingling in her feet persists.  She also has intermittent dyspnea.  The chief complaint continues to be that she is extremely frightened.  She feels that her heart is beating fast.  Insomnia is described as severe.  Constitutional: No fever, significant weight change  Eyes: No redness, discharge, pain, vision change ENT/mouth: No nasal congestion,  purulent discharge, earache, change in hearing, sore throat  Cardiovascular: No chest pain,  paroxysmal nocturnal dyspnea, claudication, edema  Respiratory: No cough, sputum production, hemoptysis, DOE, significant snoring, apnea   Gastrointestinal: No abdominal pain, nausea /vomiting, rectal bleeding, melena, change in bowels Dermatologic: No rash, pruritus, change in appearance of skin Neurologic: No dizziness, syncope, seizures Endocrine: No change in hair/skin/nails, excessive thirst, excessive hunger, excessive urination  Hematologic/lymphatic: No significant bruising, lymphadenopathy, abnormal bleeding Allergy/immunology: No angioedema  Physical exam:  Pertinent or positive findings: She has bilateral hearing aids.  The TMs are dull but there are no foreign  bodies or evidence of inflammation or infection.  Cranial nerve exam was unremarkable.  She has a slight  tachycardia. Pedal pulses are not palpable.  She has nonpitting edema.  Vitiliginous changes are noted over the hands.  She exhibits no tremor and deep tendon reflexes are normal.  General appearance:  no acute distress, increased work of breathing is present.   Lymphatic: No lymphadenopathy about the head, neck, axilla. Eyes: No conjunctival inflammation or lid edema is present. There is no scleral icterus. Ears:  External ear exam shows no significant lesions or deformities.   Nose:  External nasal examination shows no deformity or inflammation. Nasal mucosa are pink and moist without lesions, exudates Oral exam:  Lips and gums are healthy appearing. There is no oropharyngeal erythema or exudate. Neck:  No thyromegaly, masses, tenderness noted.    Heart:  No gallop, murmur, click, rub .  Lungs: Chest clear to auscultation without wheezes, rhonchi, rales, rubs. Abdomen: Bowel sounds are normal. Abdomen is soft and nontender with no organomegaly, hernias, masses. GU: Deferred  Extremities:  No cyanosis, clubbing  Skin: Warm & dry w/o tenting. .  See summary under each active problem in the Problem List with associated updated therapeutic plan:  .pro

## 2024-03-30 NOTE — Progress Notes (Signed)
 This encounter was created in error - please disregard.

## 2024-03-30 NOTE — Patient Instructions (Addendum)
 Referral will be made to a Psychiatrist who can optimally manage these medications which are not risk-free.  We need that expertise.  At this point after 4-5 days of the Effexor  150; increase the dose to 225.  We will start you out on clonazepam  0.25 mg daily for 3 days then increase it to 2 at bedtime.  The propranolol 10 mg will be taken twice a day.

## 2024-03-31 ENCOUNTER — Encounter: Payer: Self-pay | Admitting: Internal Medicine

## 2024-03-31 DIAGNOSIS — F419 Anxiety disorder, unspecified: Secondary | ICD-10-CM | POA: Insufficient documentation

## 2024-03-31 DIAGNOSIS — G629 Polyneuropathy, unspecified: Secondary | ICD-10-CM | POA: Insufficient documentation

## 2024-03-31 NOTE — Assessment & Plan Note (Addendum)
 Anxiety is disabling, impacting sleep and all ADLs.  The clonazepam  and Effexor  will be titrated back to the prior levels until psychiatry expertise can be arranged. Pheochromocytoma evaluation is not being pursued based on the history of chronicity over decades. Propranolol, nonselective beta-blocker, will be initiated.  20 mg twice daily.

## 2024-03-31 NOTE — Assessment & Plan Note (Signed)
 Dr Tresea ,ENT found no explanation for eustachian tube dysfunction.  CT scan was nonrevealing.

## 2024-03-31 NOTE — Assessment & Plan Note (Signed)
 Despite accelerated hypertension documented in the ED 9/10; blood pressure at this point is well-controlled.  Continue to monitor.

## 2024-04-01 ENCOUNTER — Other Ambulatory Visit: Payer: Self-pay | Admitting: Nurse Practitioner

## 2024-04-01 DIAGNOSIS — F419 Anxiety disorder, unspecified: Secondary | ICD-10-CM

## 2024-04-01 MED ORDER — CLONAZEPAM 0.25 MG PO TBDP: 0.2500 mg | ORAL_TABLET | Freq: Every day | ORAL | 0 refills | Status: DC

## 2024-04-01 MED ORDER — PROPRANOLOL HCL 10 MG PO TABS: 10.0000 mg | ORAL_TABLET | Freq: Two times a day (BID) | ORAL | 0 refills | Status: DC

## 2024-04-01 MED ORDER — CLONAZEPAM 0.25 MG PO TBDP: 0.2500 mg | ORAL_TABLET | Freq: Every day | ORAL | 0 refills | Status: AC

## 2024-04-01 MED ORDER — VENLAFAXINE HCL ER 75 MG PO CP24
75.0000 mg | ORAL_CAPSULE | Freq: Every day | ORAL | 0 refills | Status: DC
Start: 2024-04-01 — End: 2024-04-29

## 2024-04-01 MED ORDER — VENLAFAXINE HCL ER 75 MG PO CP24: 75.0000 mg | ORAL_CAPSULE | Freq: Every day | ORAL | 0 refills | Status: DC

## 2024-04-01 NOTE — Telephone Encounter (Signed)
 All medication you previously selected I have pended and sent back to you.

## 2024-04-01 NOTE — Telephone Encounter (Signed)
 Copied from CRM [id number]. Topic: Clinical - Medication Refill >> Apr 01, 2024 10:57 AM Carrielelia G wrote: Medication: venlafaxine  XR (EFFEXOR -XR) 150 MG 24 hr capsule  clonazePAM  (KLONOPIN ) 0.25 MG disintegrating tablet propranolol (INDERAL) 10 MG tablet    Has the patient contacted their pharmacy? Yes (Agent: If no, request that the patient contact the pharmacy for the refill. If patient does not wish to contact the pharmacy document the reason why and proceed with request.) (Agent: If yes, when and what did the pharmacy advise?)  This is the patient's preferred pharmacy:  [id number] - [place], [place] - [address] AT [facility] OF [address] [place] [address] Phone: [phone] Fax: [phone]  Is this the correct pharmacy for this prescription? Yes If no, delete pharmacy and type the correct one.    Is the patient out of the medication? Yes  Has the patient been seen for an appointment in the last year OR does the patient have an upcoming appointment? Yes  Can we respond through MyChart? No  Agent: Please be advised that Rx refills may take up to 3 business days. We ask that you follow-up with your pharmacy.

## 2024-04-01 NOTE — Telephone Encounter (Signed)
 I have faxed patient prescriptions due to PCP Caro Harlene POUR, NP multiple attempts to send electronically. Provider signed prescriptions.

## 2024-04-01 NOTE — Telephone Encounter (Addendum)
 Copied from CRM [id number]. Topic: Clinical - Medication Refill >> Apr 01, 2024 10:57 AM Carrielelia G wrote: Medication: venlafaxine  XR (EFFEXOR -XR) 150 MG 24 hr capsule (suppose to be 225) clonazePAM  (KLONOPIN ) 0.25 MG disintegrating tablet propranolol (INDERAL) 10 MG tablet  Dr Tish was to have done dose changes to the medications, but no meds were called  Please advise.     Has the patient contacted their pharmacy?  (Agent: If no, request that the patient contact the pharmacy for the refill. If patient does not wish to contact the pharmacy document the reason why and proceed with request.) (Agent: If yes, when and what did the pharmacy advise?)  This is the patient's preferred pharmacy:  [id number] - [place], [place] - [address] AT [facility] [address] [facility] [address] [place] [address] Phone: [phone] Fax: [phone]  Is this the correct pharmacy for this prescription? Yes If no, delete pharmacy and type the correct one.    Is the patient out of the medication? Yes  Has the patient been seen for an appointment in the last year OR does the patient have an upcoming appointment? Yes  Can we respond through MyChart? No  Agent: Please be advised that Rx refills may take up to 3 business days. We ask that you follow-up with your pharmacy.

## 2024-04-01 NOTE — Telephone Encounter (Signed)
 I think this is the encounter. It wasn't routed to me it was sent to [facility] Nurse. Message routed to PCP Caro, Harlene POUR, NP

## 2024-04-01 NOTE — Telephone Encounter (Signed)
 Attempted to send in Rx but looks like they did not go through, unsure why, can you pend to me through a Rx request

## 2024-04-04 ENCOUNTER — Telehealth: Payer: Self-pay | Admitting: *Deleted

## 2024-04-04 NOTE — Telephone Encounter (Signed)
 Copied from CRM #[id number]. Topic: Referral - Question >> Apr 04, 2024  3:49 PM Wendy Murray wrote: Reason for CRM: Patient states the number provided by provider was incorrect [phone]. As provider informed patient to find provider that was able to manage some of her medication.     Referring To Provider Information Rocky Murray Amasa [address] [place] [address] [phone]   Referral Start Date: 03/30/2024 Referral End Date: 03/30/2025    Patient Notified.

## 2024-04-04 NOTE — Telephone Encounter (Signed)
 Patient called back and stated that the numbers [phone] and [phone] are Wrong numbers.   Please advise. Patient would like a call from the Referral Coordinator with correct number.

## 2024-04-06 DIAGNOSIS — R2689 Other abnormalities of gait and mobility: Secondary | ICD-10-CM | POA: Diagnosis not present

## 2024-04-06 DIAGNOSIS — R278 Other lack of coordination: Secondary | ICD-10-CM | POA: Diagnosis not present

## 2024-04-06 DIAGNOSIS — R202 Paresthesia of skin: Secondary | ICD-10-CM | POA: Diagnosis not present

## 2024-04-07 ENCOUNTER — Telehealth: Payer: Self-pay | Admitting: *Deleted

## 2024-04-07 NOTE — Telephone Encounter (Signed)
 Patient called and stated that the Neurologist Prescribed Nortriptyline yesterday and she had some concerns regarding the Medication.   Advised patient to call the Neurologist office to express concerns since they prescribed.  She agreed and will call their office.

## 2024-04-08 ENCOUNTER — Encounter: Payer: PRIVATE HEALTH INSURANCE | Admitting: Student

## 2024-04-08 DIAGNOSIS — Z23 Encounter for immunization: Secondary | ICD-10-CM | POA: Diagnosis not present

## 2024-04-13 DIAGNOSIS — H903 Sensorineural hearing loss, bilateral: Secondary | ICD-10-CM | POA: Diagnosis not present

## 2024-04-13 DIAGNOSIS — H6063 Unspecified chronic otitis externa, bilateral: Secondary | ICD-10-CM | POA: Diagnosis not present

## 2024-04-18 ENCOUNTER — Encounter: Payer: Self-pay | Admitting: Radiology

## 2024-04-20 DIAGNOSIS — R760 Raised antibody titer: Secondary | ICD-10-CM | POA: Diagnosis not present

## 2024-04-20 DIAGNOSIS — R682 Dry mouth, unspecified: Secondary | ICD-10-CM | POA: Diagnosis not present

## 2024-04-20 DIAGNOSIS — R7689 Other specified abnormal immunological findings in serum: Secondary | ICD-10-CM | POA: Diagnosis not present

## 2024-04-20 DIAGNOSIS — Z79899 Other long term (current) drug therapy: Secondary | ICD-10-CM | POA: Diagnosis not present

## 2024-04-20 DIAGNOSIS — Z8739 Personal history of other diseases of the musculoskeletal system and connective tissue: Secondary | ICD-10-CM | POA: Diagnosis not present

## 2024-04-21 DIAGNOSIS — Z79899 Other long term (current) drug therapy: Secondary | ICD-10-CM | POA: Diagnosis not present

## 2024-04-28 ENCOUNTER — Encounter: Payer: Self-pay | Admitting: Nurse Practitioner

## 2024-04-28 ENCOUNTER — Other Ambulatory Visit: Payer: Self-pay | Admitting: Nurse Practitioner

## 2024-04-28 ENCOUNTER — Non-Acute Institutional Stay: Payer: Self-pay | Admitting: Nurse Practitioner

## 2024-04-28 VITALS — BP 136/82 | HR 78 | Temp 98.0°F | Ht 65.0 in | Wt 155.8 lb

## 2024-04-28 DIAGNOSIS — G6289 Other specified polyneuropathies: Secondary | ICD-10-CM | POA: Diagnosis not present

## 2024-04-28 DIAGNOSIS — F419 Anxiety disorder, unspecified: Secondary | ICD-10-CM

## 2024-04-28 DIAGNOSIS — R634 Abnormal weight loss: Secondary | ICD-10-CM

## 2024-04-28 NOTE — Progress Notes (Signed)
 Careteam: Patient Care Team: Caro Harlene POUR, NP as PCP - General (Geriatric Medicine) Pa, [facility] (Optometry)  PLACE OF SERVICE:  [facility]  Advanced Directive information Does Patient Have a Medical Advance Directive?: Yes, Type of Advance Directive: Healthcare Power of [place];Living will;Out of facility DNR (pink MOST or yellow form), Does patient want to make changes to medical advance directive?: No - Patient declined  No Known Allergies  Chief Complaint  Patient presents with   Medical Management of Chronic Issues    Medical Management of Chronic Issues. 1 Month follow up    HPI:  Discussed the use of AI scribe software for clinical note transcription with the patient, who gave verbal consent to proceed.  History of Present Illness Wendy Murray is an 88 year old female who presents for a follow-up on balance issues and tingling and anxiety   She is following up on balance issues and tingling that previously led her to visit the emergency department. She reports doing much better now. A nerve test with a neurologist showed no nerve blockage. She was prescribed nortriptyline, starting with one pill a day for a week, then increasing to two a day for the next week, but noticed no difference in her symptoms.  She has a history of weight loss and elevated blood pressure. Her anxiety was heightened due to a rapid decrease in her Effexor  dosage, but it has since been titrated back up to 225 mg daily, and she reports doing much better with her anxiety now.  She saw a rheumatologist after a positive ANA test, and further testing showed no inflammation. Her blood was sent to a clinic in [place]  for thorough analysis, and the results were normal.   Review of Systems:  Review of Systems  Constitutional:  Negative for chills, fever and weight loss.  HENT:  Negative for tinnitus.   Respiratory:  Negative for cough, sputum production and shortness of  breath.   Cardiovascular:  Negative for chest pain, palpitations and leg swelling.  Gastrointestinal:  Negative for abdominal pain, constipation, diarrhea and heartburn.  Genitourinary:  Negative for dysuria, frequency and urgency.  Musculoskeletal:  Negative for back pain, falls, joint pain and myalgias.  Skin: Negative.   Neurological:  Positive for sensory change and weakness. Negative for dizziness and headaches.  Psychiatric/Behavioral:  Negative for depression and memory loss. The patient is nervous/anxious. The patient does not have insomnia.     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   a.) presurgical PCR (+) 12/02/2023 prior to RIGHT THA   Orthostatic dizziness 05/05/2023   Osteoarthritis, knee    Recurrent major depression    Sensory hearing loss, bilateral 07/13/2018   TIA (transient ischemic attack) 01/2021   Past Surgical History:  Procedure Laterality Date   ABDOMINAL HYSTERECTOMY  06/16/1989   CATARACT EXTRACTION W/ INTRAOCULAR LENS  IMPLANT, BILATERAL  06/16/2012   CHOLECYSTECTOMY  06/16/1996   COLONOSCOPY  DEXA  08/15/2007   Normal   HAMMER TOE SURGERY Left 10/15/2014   Dr Christine   LUMBAR FUSION  06/17/1991   MENISCUS REPAIR Left 04/16/2010   PARATHYROIDECTOMY Left 06/25/2023   Procedure: NECK EXPLORATION WITH LEFT SUPERIOR PARATHYROIDECTOMY;  Surgeon: Eletha Boas, MD;  Location: [facility];  Service: General;  Laterality: Left;   PARATHYROIDECTOMY  06/25/2023   TOTAL HIP ARTHROPLASTY Right 12/09/2023   Procedure:  ARTHROPLASTY, HIP, TOTAL, ANTERIOR APPROACH;  Surgeon: Onesimo Oneil LABOR, MD;  Location: [facility] ORS;  Service: Orthopedics;  Laterality: Right;   TOTAL KNEE ARTHROPLASTY Left 06/16/2010   TOTAL KNEE ARTHROPLASTY Right 07/09/2021   Procedure: TOTAL KNEE ARTHROPLASTY;  Surgeon: Edie Norleen PARAS, MD;  Location: [facility] ORS;  Service: Orthopedics;  Laterality: Right;   WRIST SURGERY Bilateral    Social History:   reports that she quit smoking about 42 years ago. Her smoking use included cigarettes. She has been exposed to tobacco smoke. She has never used smokeless tobacco. She reports that she does not currently use alcohol. She reports that she does not use drugs.  Family History  Problem Relation Age of Onset   Cancer Mother        colon   Depression Mother    Cancer Father    Depression Sister    Hypertension Neg Hx    Heart disease Neg Hx    Diabetes Neg Hx     Medications: Patient's Medications  New Prescriptions   No medications on file  Previous Medications   ACETAMINOPHEN  (TYLENOL ) 325 MG TABLET    Take 650 mg by mouth every 6 (six) hours as needed.   ATORVASTATIN  (LIPITOR) 40 MG TABLET    Take 1 tablet (40 mg total) by mouth every evening.   CHOLECALCIFEROL  (VITAMIN D3) 25 MCG (1000 UNIT) TABLET    Take 1,000 Units by mouth daily.   CLONAZEPAM  (KLONOPIN ) 0.25 MG DISINTEGRATING TABLET    Take 1 tablet (0.25 mg total) by mouth daily. For 3 Days at bedtime. Then Two tablet by mouth daily at bedtime thereafter.   CYANOCOBALAMIN  (VITAMIN B12) 1000 MCG TABLET    Take 1,000 mcg by mouth daily.   FERROUS SULFATE  DRIED (SLOW RELEASE IRON) 45 MG TBCR    Take 45 mg by mouth daily.   FOLIC ACID (FOLVITE) 400 MCG TABLET    Take 400 mcg by mouth daily.   LEVOTHYROXINE  (SYNTHROID ) 100 MCG TABLET    Take 1 tablet (100 mcg total) by mouth daily.   MIRTAZAPINE  (REMERON ) 15 MG TABLET    Take 1 tablet (15 mg total) by mouth at bedtime.   NORTRIPTYLINE (PAMELOR) 10 MG CAPSULE    Take 30 mg by mouth daily.    OMEPRAZOLE  (PRILOSEC) 20 MG CAPSULE    Take 1 capsule (20 mg total) by mouth every evening.   PROBIOTIC PRODUCT (ALIGN) 4 MG CAPS    Take 4 mg by mouth at bedtime.   PROPRANOLOL (INDERAL) 10 MG TABLET    TAKE 1 TABLET BY MOUTH TWICE DAILY   VALSARTAN  (DIOVAN ) 160 MG TABLET    Take 1 tablet (160 mg total) by mouth daily.   VENLAFAXINE  XR (EFFEXOR -XR) 150 MG 24 HR CAPSULE    Take 150 mg by mouth daily.   VENLAFAXINE  XR (EFFEXOR -XR) 75 MG 24 HR CAPSULE    Take 1 capsule (75 mg total) by mouth daily with breakfast. To take with 150 mg to equal 225 mg daily  Modified Medications   No medications on file  Discontinued Medications   No medications on file    Physical Exam:  Vitals:   04/28/24 1054  BP: 136/82  Pulse: 78  Temp: 98 F (36.7 C)  SpO2: 96%  Weight: 155 lb 12.8 oz (70.7 kg)  Height: 5' 5 (1.651 m)   Body mass index is 25.93 kg/m. Wt Readings from Last 3 Encounters:  04/28/24 155 lb 12.8 oz (70.7 kg)  03/30/24 154 lb 12.8 oz (70.2 kg)  03/08/24 153 lb (69.4 kg)    Physical Exam Constitutional:      General: She is not in acute distress.    Appearance: She is well-developed. She is not diaphoretic.  HENT:     Head: Normocephalic and atraumatic.     Mouth/Throat:     Pharynx: No oropharyngeal exudate.  Eyes:     Conjunctiva/sclera: Conjunctivae normal.     Pupils: Pupils are equal, round, and reactive to light.  Cardiovascular:     Rate and Rhythm: Normal rate and regular rhythm.     Heart sounds: Normal heart sounds.  Pulmonary:     Effort: Pulmonary effort is normal.     Breath sounds: Normal breath sounds.  Abdominal:     General: Bowel sounds are normal.     Palpations: Abdomen is soft.  Musculoskeletal:     Cervical back: Normal range of motion and neck supple.     Right lower leg: No edema.     Left lower leg: No edema.  Skin:    General: Skin is warm and dry.  Neurological:     Mental Status: She is alert.  Psychiatric:        Mood and Affect:  Mood normal.     Labs reviewed: Basic Metabolic Panel: Recent Labs    07/13/23 0804 09/07/23 0740 12/02/23 1101 01/11/24 0800 02/24/24 1048 03/07/24 0740  NA 140  --  139 139 139  --   K 3.9  --  3.8 4.4 3.3*  --   CL 105  --  105 105 102  --   CO2 27  --  24 26 25   --   GLUCOSE 92  --  106* 101* 114*  --   BUN 14  --  14 15 13   --   CREATININE 0.69  --  0.58 0.92 0.68  --   CALCIUM  8.9  8.9  --  8.8* 9.1 9.0  --   TSH 7.11* 3.42  --   --   --  3.42   Liver Function Tests: Recent Labs    07/13/23 0804 01/11/24 0800 02/24/24 1048  AST 18 17 20   ALT 19 16 20   ALKPHOS  --   --  86  BILITOT 0.4 0.3 0.6  PROT 6.4 6.5 6.8  ALBUMIN  --   --  4.0   No results for input(s): LIPASE, AMYLASE in the last 8760 hours. No results for input(s): AMMONIA in the last 8760 hours. CBC: Recent Labs    07/13/23 0804 12/02/23 1101 12/09/23 1051 12/15/23 0000 01/11/24 0800 02/24/24 1048  WBC 9.1 8.6  --  9.4 6.3 9.9  NEUTROABS 5,788  --   --  6,270.00 3,383  --   HGB 12.4 12.1   < > 8.9* 11.2* 12.1  HCT 39.0 38.0   < > 28* 36.8 39.0  MCV 85.3 84.6  --   --  87.6 85.9  PLT 599* 386  --  554* 439* 420*   < > = values in this interval  not displayed.   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 Neurologist confirmed no nerve blockage. Nortriptyline ineffective after two weeks. Concerns about polypharmacy and side effects. - Titrate off nortriptyline: reduce to one tablet weekly, then every other day, then stop since not effective and without changes in symptoms.   Anxiety disorder Anxiety  well-controlled with Effexor  225 mg. - Continue Effexor  225 mg daily.  Weight loss, improved Weight loss improved with consistent eating habits.    Plans to establish with another PCP so will not make follow up at this time.   Wendy Murray K. Caro BODILY [facility] [phone]

## 2024-04-29 ENCOUNTER — Other Ambulatory Visit: Payer: Self-pay | Admitting: Nurse Practitioner

## 2024-04-29 DIAGNOSIS — F419 Anxiety disorder, unspecified: Secondary | ICD-10-CM

## 2024-05-04 ENCOUNTER — Ambulatory Visit (INDEPENDENT_AMBULATORY_CARE_PROVIDER_SITE_OTHER)

## 2024-05-04 VITALS — BP 132/82 | HR 100 | Temp 98.0°F | Ht 65.0 in | Wt 157.0 lb

## 2024-05-04 DIAGNOSIS — Z96641 Presence of right artificial hip joint: Secondary | ICD-10-CM | POA: Insufficient documentation

## 2024-05-04 DIAGNOSIS — Z8673 Personal history of transient ischemic attack (TIA), and cerebral infarction without residual deficits: Secondary | ICD-10-CM

## 2024-05-04 DIAGNOSIS — E785 Hyperlipidemia, unspecified: Secondary | ICD-10-CM

## 2024-05-04 DIAGNOSIS — E876 Hypokalemia: Secondary | ICD-10-CM | POA: Diagnosis not present

## 2024-05-04 DIAGNOSIS — E039 Hypothyroidism, unspecified: Secondary | ICD-10-CM

## 2024-05-04 DIAGNOSIS — G4700 Insomnia, unspecified: Secondary | ICD-10-CM

## 2024-05-04 DIAGNOSIS — D75839 Thrombocytosis, unspecified: Secondary | ICD-10-CM | POA: Insufficient documentation

## 2024-05-04 DIAGNOSIS — F419 Anxiety disorder, unspecified: Secondary | ICD-10-CM

## 2024-05-04 DIAGNOSIS — I5032 Chronic diastolic (congestive) heart failure: Secondary | ICD-10-CM | POA: Diagnosis not present

## 2024-05-04 LAB — CBC WITH DIFFERENTIAL/PLATELET
Basophils Absolute: 0 K/uL (ref 0.0–0.1)
Basophils Relative: 0.4 % (ref 0.0–3.0)
Eosinophils Absolute: 0.1 K/uL (ref 0.0–0.7)
Eosinophils Relative: 1.7 % (ref 0.0–5.0)
HCT: 39.1 % (ref 36.0–46.0)
Hemoglobin: 12.6 g/dL (ref 12.0–15.0)
Lymphocytes Relative: 18.7 % (ref 12.0–46.0)
Lymphs Abs: 1.5 K/uL (ref 0.7–4.0)
MCHC: 32.4 g/dL (ref 30.0–36.0)
MCV: 82.3 fl (ref 78.0–100.0)
Monocytes Absolute: 0.8 K/uL (ref 0.1–1.0)
Monocytes Relative: 9.1 % (ref 3.0–12.0)
Neutro Abs: 5.8 K/uL (ref 1.4–7.7)
Neutrophils Relative %: 70.1 % (ref 43.0–77.0)
Platelets: 398 K/uL (ref 150.0–400.0)
RBC: 4.75 Mil/uL (ref 3.87–5.11)
RDW: 15.8 % — ABNORMAL HIGH (ref 11.5–15.5)
WBC: 8.3 K/uL (ref 4.0–10.5)

## 2024-05-04 LAB — POTASSIUM: Potassium: 3.9 meq/L (ref 3.5–5.1)

## 2024-05-04 MED ORDER — MELATONIN 5 MG PO CAPS: 5.0000 mg | ORAL_CAPSULE | Freq: Every day | ORAL | 0 refills | Status: DC

## 2024-05-04 MED ORDER — PROPRANOLOL HCL 10 MG PO TABS: 10.0000 mg | ORAL_TABLET | Freq: Two times a day (BID) | ORAL | Status: AC | PRN

## 2024-05-04 NOTE — Progress Notes (Signed)
 Subjective:   This visit was conducted in person. The patient gave informed consent to the use of Abridge AI technology to record the contents of the encounter as documented below.   Patient ID: Wendy Murray, female    DOB: February 10, 1936, 88 y.o.   MRN: [id number]   Wendy Murray is a very pleasant 88 y.o. female who presents today as a new patient.  Past medical, surgical and family history: Reviewed and updated in chart.  Allergies: Reviewed and updated in chart.  Medications: Reviewed and updated in chart.  Social history: Reviewed and updated in chart.  Last PCP and reason for leaving: Dr Abdul suddenly left [place]  Discussed the use of AI scribe software for clinical note transcription with the patient, who gave verbal consent to proceed.  History of Present Illness Wendy Murray is an 88 year old female who presents for primary care establishment and medication review.  She experiences balance issues and tingling in her feet, which began after her right hip replacement in June 2025. She is under the care of a neurologist and recently underwent a nerve test, which was inconclusive.  Her ears feel blocked, described as 'like airplane ears.' She experiences hearing loss and uses hearing aids, which affects her ability to play music and discern musical pitches. She follows with an ENT specialist and has had hearing evaluations.  She is concerned about potential interactions between her prescribed nortriptyline and her other medications, mirtazapine  and venlafaxine . She also takes levothyroxine  100 micrograms daily for hypothyroidism, atorvastatin  40 mg daily for high cholesterol, valsartan  160 mg daily for high blood pressure, and omeprazole  20 mg every evening for acid reflux. Additionally, she takes supplements including a probiotic, folic acid, iron sulfate, and B12 1000 micrograms daily.  She has a history of a TIA in August 2022 and was previously  on daily aspirin  for clot prevention but self discontinued. She is not currently on aspirin .  She uses clonazepam  at night for sleep but has tried melatonin in the past without effect. She is willing to try a higher dose.  She lives independently in a villa at [facility] and has children in [place] , [place] , and [place] . Her daughter Wendy Murray is her healthcare power of attorney. She has a living will and plans to donate her body to [facility].    Review of Systems  All other systems reviewed and are negative.       BP 132/82 (BP Location: Left Arm, Patient Position: Sitting, Cuff Size: Normal)   Pulse 100   Temp 98 F (36.7 C) (Oral)   Ht 5' 5 (1.651 m)   Wt 157 lb (71.2 kg)   SpO2 96%   BMI 26.13 kg/m   Objective:     Physical Exam GENERAL: Alert, cooperative, well developed, no acute distress. HEAD: Normocephalic atraumatic. EYES: Extraocular movements intact BL, pupils round, equal and reactive to light BL, conjunctivae normal BL. EXTREMITIES: No cyanosis or edema. NEUROLOGICAL: Oriented to person, place and time, no gait abnormalities, moves all extremities without gross motor or sensory deficit.        Assessment & Plan:   Assessment & Plan Insomnia New patient, past medical and social history thoroughly reviewed and updated in chart.  Chronic insomnia managed with clonazepam , not medically appropriate, extensively discussed associated risks. Previous melatonin trial ineffective. Discussed melatonin benefits, she is agreeable to trial. If no effect, can consider increasing nighttime mirtazapine  dose versus doxepin.  - Start melatonin 5 mg  nightly, increase to 10 mg if ineffective after 3 days. - Use clonazepam  only if experiencing withdrawal symptoms such as tremors.  Anxiety disorder Managed with mirtazapine  and venlafaxine . Concerns about polypharmacy and potential interactions with discontinued nortriptyline. Upcoming appointment with psychiatrist Dr.  Chipper.  - Follow up with psychiatrist Dr. Chipper in two weeks for medication management. - Continue venlafaxine  225 mg daily - Continue mirtazapine  15 mg nightly  Hypothyroidism Managed with levothyroxine . Discussed importance of taking levothyroxine  on an empty stomach due to absorption concerns, she is agreeable to augment how she is taking it.  - Take levothyroxine  on an empty stomach, separate from other medications. - Repeat thyroid  function tests in 8 weeks.  Chronic diastolic heart failure Well-controlled with valsartan . No current heart failure symptoms. Last echocardiogram over three years ago was WNL. Discussed need for updated echocardiogram. - Ordered echocardiogram at [facility] to assess heart function. - Continue valsartan  160 mg daily.  Hyperlipidemia Managed with atorvastatin . Cholesterol levels improved, reducing TIA risk. - Continue atorvastatin  40 mg daily.  Thrombocytosis Noted on previous labs. Monitoring required. - Ordered repeat blood test to assess platelet levels.  Hypokalemia Previous labs showed low potassium levels. Dietary adjustments made. - Ordered repeat potassium level to assess current status.  History of TIA without residual deficits History of TIA in August 2022 without residual deficits. Risk factors managed with atorvastatin  and blood pressure control.  Extensively discussed about daily aspirin  which she reportedly self discontinued a while ago.  Discussed about insufficient evidence of benefit in elderly patients regarding secondary prevention, as well as increased risk for adverse effects in this population such as PUD and GI bleed, through process of shared decision making with the patient she opts against daily aspirin  use.  - Continue atorvastatin  and blood pressure management for risk reduction.     Return in about 2 weeks (around 05/18/2024) for Sleep then 4 weeks from today for CPE.   [name], MD  05/04/24     Contains text generated by [employer].

## 2024-05-04 NOTE — Patient Instructions (Signed)
 Thank you for visiting  Healthcare today! Here's what we talked about: - START Melatonin 1 tablet first, increase to 2 tablets if innefective. Only take Klonopin  if melatonin is not working, or if you're having tremors

## 2024-05-05 ENCOUNTER — Ambulatory Visit: Payer: Self-pay

## 2024-05-09 DIAGNOSIS — R202 Paresthesia of skin: Secondary | ICD-10-CM | POA: Diagnosis not present

## 2024-05-09 DIAGNOSIS — R2689 Other abnormalities of gait and mobility: Secondary | ICD-10-CM | POA: Diagnosis not present

## 2024-05-09 DIAGNOSIS — R278 Other lack of coordination: Secondary | ICD-10-CM | POA: Diagnosis not present

## 2024-05-18 ENCOUNTER — Ambulatory Visit

## 2024-05-18 VITALS — BP 114/70 | HR 88 | Temp 97.8°F | Ht 65.0 in | Wt 159.0 lb

## 2024-05-18 DIAGNOSIS — H9193 Unspecified hearing loss, bilateral: Secondary | ICD-10-CM | POA: Diagnosis not present

## 2024-05-18 DIAGNOSIS — G4700 Insomnia, unspecified: Secondary | ICD-10-CM | POA: Diagnosis not present

## 2024-05-18 DIAGNOSIS — J029 Acute pharyngitis, unspecified: Secondary | ICD-10-CM | POA: Diagnosis not present

## 2024-05-18 LAB — POCT INFLUENZA A/B
Influenza A, POC: NEGATIVE
Influenza B, POC: NEGATIVE

## 2024-05-18 LAB — POCT RAPID STREP A (OFFICE): Rapid Strep A Screen: NEGATIVE

## 2024-05-18 LAB — POC COVID19 BINAXNOW: SARS Coronavirus 2 Ag: NEGATIVE

## 2024-05-18 NOTE — Patient Instructions (Addendum)
 Thank you for visiting [facility] today! Here's what we talked about: - START Tylenol  2 tablets every 8hrs as needed for throat pain, Get Chloraseptic sore throat spray or something similar and may use Lozenges throughout the day as needed - Call [facility] at [phone] if you do not hear from them in 2 weeks

## 2024-05-18 NOTE — Progress Notes (Unsigned)
 Subjective:   This visit was conducted in person. The patient gave informed consent to the use of Abridge AI technology to record the contents of the encounter as documented below.   Patient ID: Wendy Murray, female    DOB: [birth year], 88 y.o.   MRN: [id number]   Discussed the use of AI scribe software for clinical note transcription with the patient, who gave verbal consent to proceed.  History of Present Illness Wendy Murray is an 88 year old female who presents with a sore throat and blocked eustachian tubes.  She has been experiencing a sore throat for the past two nights, which began after visiting her daughter in [place] , who was also sick. The sore throat is accompanied by a mild productive cough and shortness of breath. The cough causes pain in her throat but not in her chest. No body aches, fever, or headaches.  She experiences nasal congestion and a slightly runny nose. She describes having 'airplane ears' and blocked eustachian tubes since August, which has distorted her hearing and affected her ability to participate in musical activities. 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 Despite multiple attempts, she has been unable to schedule an appointment with the audiology department at [facility]. Her ear doctor at [facility] has been unable to determine the cause of her symptoms.  For sleep issues, she has been taking gabapentin, three tablets at night, which has been helping her. She previously tried melatonin up to 10 mg without success.  Sore throat, mild sob, mild cough, no body aches, no HA, upper congestion, has blocked ears since aguast, distorted,   Strep, covid, flu, should be on daily ASA   Review of Systems      No Known Allergies  Current Outpatient Medications on File Prior to Visit  Medication Sig Dispense Refill   acetaminophen  (TYLENOL ) 325 MG tablet Take  650 mg by mouth every 6 (six) hours as needed.     atorvastatin  (LIPITOR) 40 MG tablet Take 1 tablet (40 mg total) by mouth every evening. 90 tablet 3   cholecalciferol  (VITAMIN D3) 25 MCG (1000 UNIT) tablet Take 1,000 Units by mouth daily.     cyanocobalamin  (VITAMIN B12) 1000 MCG tablet Take 1,000 mcg by mouth daily.     Ferrous Sulfate  Dried (SLOW RELEASE IRON) 45 MG TBCR Take 45 mg by mouth daily.     folic acid (FOLVITE) 400 MCG tablet Take 400 mcg by mouth daily.     gabapentin (NEURONTIN) 100 MG capsule Take 100-300 mg by mouth at bedtime.     levothyroxine  (SYNTHROID ) 100 MCG tablet Take 1 tablet (100 mcg total) by mouth daily. 90 tablet 3   mirtazapine  (REMERON ) 15 MG tablet Take 1 tablet (15 mg total) by mouth at bedtime. 90 tablet 3   omeprazole  (PRILOSEC) 20 MG capsule Take 1 capsule (20 mg total) by mouth every evening. 90 capsule 3   Probiotic Product (ALIGN) 4 MG CAPS Take 4 mg by mouth at bedtime.     propranolol  (INDERAL ) 10 MG tablet Take 1 tablet (10 mg total) by mouth 2 (two) times daily as needed (performance anxiety).     valsartan  (DIOVAN ) 160 MG tablet Take 1 tablet (160 mg total) by mouth daily. 90 tablet 3   venlafaxine  XR (EFFEXOR -XR) 150 MG 24 hr capsule Take 150 mg by mouth daily.     venlafaxine  XR (EFFEXOR -XR) 75 MG 24 hr capsule  TAKE 1 CAPSULE BY MOUTH EVERY DAY WITH BREAKFAST (Patient taking differently: Take 75 mg by mouth daily with breakfast. Take along with 150 mg daily) 30 capsule 0   clonazePAM  (KLONOPIN ) 0.25 MG disintegrating tablet Take 1 tablet (0.25 mg total) by mouth daily. For 3 Days at bedtime. Then Two tablet by mouth daily at bedtime thereafter. (Patient not taking: Reported on 05/18/2024) 60 tablet 0   No current facility-administered medications on file prior to visit.    BP 114/70 (BP Location: Left Arm, Patient Position: Sitting, Cuff Size: Normal)   Pulse 88   Temp 97.8 F (36.6 C) (Oral)   Ht 5' 5 (1.651 m)   Wt 159 lb (72.1 kg)   SpO2  94%   BMI 26.46 kg/m   Objective:      Physical Exam VITALS: T- 97.8 GENERAL: Alert, cooperative, well developed, no acute distress. HEAD: Normocephalic atraumatic. EYES: Extraocular movements intact BL, pupils round, equal and reactive to light BL, conjunctivae normal BL. EARS: Tympanic membrane, ear canal and external ear normal BL. NOSE: No frontal or maxillary sinus tenderness bilaterally, no congestion or rhinorrhea, mucous membranes are moist. THROAT: Moderate posterior pharyngeal erythema, no oropharyngeal exudate. CARDIOVASCULAR: Normal heart rate and rhythm, S1 and S2 normal without murmurs. CHEST: Clear to auscultation bilaterally, no wheezes, rhonchi, or crackles. ABDOMEN: Soft, non tender, non distended, without organomegaly, normal bowel sounds. EXTREMITIES: No cyanosis or edema. NEUROLOGICAL: Oriented to person, place and time, no gait abnormalities, moves all extremities without gross motor or sensory deficit. NECK: Cervical lymphadenopathy present.         Assessment & Plan:   Assessment and Plan Assessment & Plan Acute viral pharyngitis Likely viral etiology with negative flu, COVID, and strep tests. Symptoms include mild productive cough, throat pain, and mild shortness of breath. No severe infection signs. - Take Tylenol  Extra Strength, two tablets every eight hours as needed for throat pain. - Use throat lozenges as needed. - Use Chloraseptic throat spray as needed. - Perform warm salt water gargles two to three times a day. - Return if sore throat does not improve in 7-10 days.  Bilateral hearing loss with chronic eustachian tube dysfunction Chronic eustachian tube dysfunction since August causing significant hearing impact. Previous audiology referral unsuccessful. - Sent referral to [facility] in [place]. - Instructed to call [facility] if no contact in two weeks. - Provided phone number for  follow-up.  Insomnia Melatonin ineffective. Gabapentin prescribed by neurologist aiding sleep. - Continue gabapentin as prescribed by neurologist.  Hypothyroidism Thyroid  medication regimen adjusted. Thyroid  lab test planned to assess function. - Schedule thyroid  lab test on January 19th, 2026, to coincide with follow-up visit. - Draw thyroid  lab on the day of the follow-up visit.      No follow-ups on file.   Kaylan Friedmann K Malie Kashani, MD  05/18/24     Contains text generated by Abridge.

## 2024-05-19 ENCOUNTER — Ambulatory Visit: Payer: Self-pay

## 2024-05-23 ENCOUNTER — Other Ambulatory Visit

## 2024-05-25 ENCOUNTER — Other Ambulatory Visit

## 2024-05-25 DIAGNOSIS — F330 Major depressive disorder, recurrent, mild: Secondary | ICD-10-CM | POA: Diagnosis not present

## 2024-05-25 DIAGNOSIS — F411 Generalized anxiety disorder: Secondary | ICD-10-CM | POA: Diagnosis not present

## 2024-05-25 DIAGNOSIS — F5105 Insomnia due to other mental disorder: Secondary | ICD-10-CM | POA: Diagnosis not present

## 2024-05-26 ENCOUNTER — Ambulatory Visit: Admission: RE | Admit: 2024-05-26 | Discharge: 2024-05-26

## 2024-05-26 DIAGNOSIS — I340 Nonrheumatic mitral (valve) insufficiency: Secondary | ICD-10-CM | POA: Diagnosis not present

## 2024-05-26 DIAGNOSIS — I5032 Chronic diastolic (congestive) heart failure: Secondary | ICD-10-CM

## 2024-05-26 DIAGNOSIS — I509 Heart failure, unspecified: Secondary | ICD-10-CM | POA: Diagnosis not present

## 2024-05-26 LAB — ECHOCARDIOGRAM COMPLETE
AR max vel: 1.87 cm2
AV Area VTI: 1.92 cm2
AV Area mean vel: 1.85 cm2
AV Mean grad: 5 mmHg
AV Peak grad: 10.1 mmHg
Ao pk vel: 1.59 m/s
Area-P 1/2: 3.31 cm2
Calc EF: 56.8 %
MV VTI: 2.65 cm2
S' Lateral: 3 cm
Single Plane A2C EF: 41 %
Single Plane A4C EF: 69 %

## 2024-06-23 ENCOUNTER — Ambulatory Visit

## 2024-06-23 ENCOUNTER — Other Ambulatory Visit: Payer: Self-pay | Admitting: Orthopedic Surgery

## 2024-06-23 ENCOUNTER — Ambulatory Visit: Admitting: Orthopedic Surgery

## 2024-06-23 DIAGNOSIS — M1611 Unilateral primary osteoarthritis, right hip: Secondary | ICD-10-CM | POA: Diagnosis not present

## 2024-06-23 DIAGNOSIS — Z96641 Presence of right artificial hip joint: Secondary | ICD-10-CM

## 2024-06-23 DIAGNOSIS — M25551 Pain in right hip: Secondary | ICD-10-CM

## 2024-06-26 ENCOUNTER — Encounter: Payer: Self-pay | Admitting: Orthopedic Surgery

## 2024-06-26 MED ORDER — AMOXICILLIN 500 MG PO CAPS: 2000.0000 mg | ORAL_CAPSULE | Freq: Once | ORAL | 0 refills | Status: AC

## 2024-06-26 NOTE — Progress Notes (Signed)
 Orthopaedic Postop Note  Assessment: Wendy Murray is a 89 y.o. female s/p Right total hip arthroplasty, anterior approach  DOS: 12/09/23  Plan: Wendy Murray is doing very well overall.  She denies pain.  Surgical incision has fully healed.  She will occasionally use a cane to assist with ambulation, but this is not related to her right hip.  She is scheduled to undergo an extensive dental procedure with extraction of an infected tooth over the next month.  As such, I have recommended antibiotic prophylaxis.  Prescription sent to her pharmacy.  She will contact the clinic with any issues.  Follow up in 6 months.    Follow-up: Return in about 6 months (around 12/21/2024). XR at next visit: AP pelvis and Right hip  Subjective:  Chief Complaint  Patient presents with   Follow-up    R THA    History of Present Illness: Wendy Murray is a 89 y.o. female who presents following the above stated procedure.  Surgery was approximately 6 months ago.  Follow up complicated by wound breakdown.  She is doing well.  Incision is fully healed.  No pain.  Occasional issues with balance, but not related to pain.  She has a painful tooth that is to be extracted soon.    Review of Systems: No fevers or chills No numbness or tingling No Chest Pain No shortness of breath   Objective: There were no vitals taken for this visit.  Physical Exam:  Alert and oriented.  No acute distress  Right hip incision is healed.  No further issues.  Ambulating well.  No tenderness.  No pain with gentle motion of the hip.   IMAGING: I personally ordered and reviewed the following images:  XR of the right hip remain unchanged.  No lucency.  No dislocation.  No fracture.    Oneil DELENA Horde, MD 06/26/2024 1:10 AM

## 2024-07-04 ENCOUNTER — Ambulatory Visit

## 2024-07-04 VITALS — BP 100/70 | HR 93 | Temp 97.9°F | Ht 65.0 in | Wt 156.0 lb

## 2024-07-04 DIAGNOSIS — R42 Dizziness and giddiness: Secondary | ICD-10-CM

## 2024-07-04 DIAGNOSIS — Z0000 Encounter for general adult medical examination without abnormal findings: Secondary | ICD-10-CM | POA: Diagnosis not present

## 2024-07-04 DIAGNOSIS — G4700 Insomnia, unspecified: Secondary | ICD-10-CM | POA: Diagnosis not present

## 2024-07-04 DIAGNOSIS — G629 Polyneuropathy, unspecified: Secondary | ICD-10-CM

## 2024-07-04 DIAGNOSIS — M8589 Other specified disorders of bone density and structure, multiple sites: Secondary | ICD-10-CM | POA: Diagnosis not present

## 2024-07-04 DIAGNOSIS — Z0001 Encounter for general adult medical examination with abnormal findings: Secondary | ICD-10-CM

## 2024-07-04 DIAGNOSIS — Z23 Encounter for immunization: Secondary | ICD-10-CM

## 2024-07-04 LAB — VITAMIN D 25 HYDROXY (VIT D DEFICIENCY, FRACTURES): VITD: 32.64 ng/mL (ref 30.00–100.00)

## 2024-07-04 MED ORDER — CALCIUM-VITAMIN D3 600-12.5 MG-MCG PO CAPS: 2.0000 | ORAL_CAPSULE | Freq: Every day | ORAL | 3 refills | Status: AC

## 2024-07-04 MED ORDER — MECLIZINE HCL 12.5 MG PO TABS: 12.5000 mg | ORAL_TABLET | Freq: Three times a day (TID) | ORAL | 0 refills | Status: AC | PRN

## 2024-07-04 MED ORDER — GABAPENTIN 300 MG PO CAPS: 600.0000 mg | ORAL_CAPSULE | Freq: Every day | ORAL | 3 refills | Status: AC

## 2024-07-04 NOTE — Progress Notes (Signed)
 Has never had Prevnar 20 Sees dentist regularly.

## 2024-07-04 NOTE — Patient Instructions (Addendum)
 Thank you for visiting [facility] today! Here's what we talked about: - START new combination Calcium -Vitamin D  supplement. Stop old vitamin D  supplement.  If insurance issues, get Caltrate or Citracal. Daily total calcium  is 1200mg , daily total vitamin D  is 1000 units. - STOP old gabapentin , start new one at 2 tablets nightly. If no effect after 5 days increase to 3 tablets - Bring details on your contact at [facility] and electronic copy of Living will/power attorney  - START Meclizine  for dizziness

## 2024-07-04 NOTE — Progress Notes (Signed)
 "  Assessment & Plan:   This visit was conducted in person. The patient gave informed consent to the use of [employer] AI technology to record the contents of the encounter as documented below.  Assessment & Plan Polyneuropathy Chronic tingling in feet and feeling of being revved up affecting sleep. Current gabapentin  dose insufficient. No anxiety at night. Vitamin B6 level not recently checked.  B12 level recently WNL. - Increased gabapentin  to 600 mg at bedtime. If no improvement after 5 days, increase to 900 mg. - Ordered vitamin B6 level to assess for deficiency. - Scheduled follow-up in 2 weeks to assess response to gabapentin  adjustment.  Insomnia Difficulty sleeping due to tingling in feet. Current gabapentin  dose insufficient. No caffeine or anxiety at night. - Increased gabapentin  to 600 mg at bedtime. If no improvement after 5 days, increase to 900 mg. - Encouraged calming activities before bed, such as decaffeinated tea, essential oils, or a hot bath.  Osteopenia with high fracture risk Low bone density with high fracture risk. Last bone scan in 2024 showed osteopenia with elevated FRAX score. Normal recent calcium  levels. Not currently on bisphosphonate.  On vitamin D  but no calcium , discussed potential side effects of calcium  supplementation.   - Started combination calcium  and vitamin D  supplement, 1200 mg calcium  and 1000 IU vitamin D  daily. - Discontinued separate vitamin D  supplement. - Ordered repeat bone scan for May 2026. - Encouraged weight-bearing exercises such as walking, Pilates, and yoga.  Chronic dizziness and disequilibrium Constant disequilibrium and lightheadedness.  Differential includes vertigo versus vertebrobasilar insufficiency versus cerebellar pathology. Previous ENT evaluation inconclusive.  Will trial meclizine  and consider other etiologies at next visit if suboptimal response. - Started meclizine  for dizziness, 1 tablet every 8 hours as needed. -  Scheduled follow-up in 2 weeks to assess response to meclizine  and discuss potential carotid imaging.  General Health maintenance I have personally reviewed and have noted: 1. [name] medical and social history 2. Their use of alcohol, tobacco or illicit drugs 3. Their current medications and supplements 4. [name] functional ability including ADL's, fall risks, home safety risks and hearing or visual impairment. 5. Diet and physical activities 6. Evidence for depression or mood disorder   DEXA: Done in 2024, showed osteopenia with elevated FRAX score Labs: N/A Immunizations: Prevnar today  Diet and Exercise: Needs to exercise Sleep and mood: See A/P on insomnia Dental and vision:UTD ADLs and IADLs: Capable Cognitive: Intact to orientation, naming, recall and repetition Fall risk and home safety: No concerns Health counselling given: Weight bearing exercises given osteopenia  Advanced Directives Patient does have advanced directives including living will and healthcare power of attorney. She does not have a copy in the electronic medical record.   Follow-up: Return in about 2 weeks (around 07/18/2024) for Dizziness and Neuropathy.        Subjective:   Patient ID: Wendy Murray, female    DOB: [birth year]  Age: 89 y.o. MRN: [id number]  Patient Care Team: Bennett Reuben MARLA, MD as PCP - General (Family Medicine) [facility], [facility])   CC:  Chief Complaint  Patient presents with   Annual Visit    Medicare Wellness was done 11-12-23 Not sleeping well. Adrenaline feeling, on edge when laying down. Gabapentin  helps at times.     Wendy Murray is a 89 y.o. female here for annual physical and f/u on chronic conditions, see assessment and plan for further details Acute complaint of dizziness and insomnia also addressed,  see assessment and plan for further details  Discussed the use of AI scribe software for clinical note transcription with the  patient, who gave verbal consent to proceed.  History of Present Illness Wendy Murray is an 89 year old female who presents with sleep disturbances and dizziness.  She experiences significant sleep disturbances characterized by a constant feeling of high adrenaline flow at night, described as feeling 'revved up.' There is also constant tingling in her feet, which does not improve with her current gabapentin  regimen of 300 mg at bedtime. Occasionally, she sleeps well, possibly due to exhaustion, but generally struggles with regular sleep. No anxiety at night and no caffeine consumption. Her routine before bed includes watching TV or reading.  She has a history of osteopenia and underwent a bone scan in 2024. She has been taking a vitamin D  supplement daily but had previously stopped calcium  supplements due to high blood calcium  levels. Recent labs from September 2025 showed normal calcium  levels. She is not on bone-strengthening medications like Fosamax and is scheduled for a follow-up bone scan in May 2026.  She describes a persistent feeling of dizziness, likening it to being 'slightly drunk' and experiencing disequilibrium. She feels unsteady but has not fallen, as she is cautious. There is a sensation of ear fullness and she has been evaluated by an ENT without a definitive diagnosis. No nausea or a sensation of her surroundings moving. Previous MRI and CT scans did not reveal any abnormalities.  Her current medications include gabapentin  300 mg at bedtime, levothyroxine , Effexor , mirtazapine  at bedtime, and atorvastatin  at night. She has not been engaging in regular exercise and attributes this to a lack of motivation, possibly related to her sleep issues. She lives in [place] and is socially isolated, missing the social aspect of her music groups, and plans to reconnect with them despite her hearing difficulties.    Depression Screening;    07/04/2024    9:49 AM 05/04/2024    10:39 AM 04/28/2024   11:00 AM 01/08/2024   10:04 AM 11/13/2023    9:10 AM 11/13/2023    8:47 AM 11/12/2023   10:21 AM  PHQ 2/9 Scores  PHQ - 2 Score 0 0 0 0 1 0 1  PHQ- 9 Score 0    3        Data saved with a previous flowsheet row definition     Anxiety Screening:     No data to display           ROS: Negative unless specifically indicated above in HPI.       Objective:     BP 100/70 (BP Location: Left Arm, Patient Position: Sitting, Cuff Size: Normal)   Pulse 93   Temp 97.9 F (36.6 C) (Oral)   Ht 5' 5 (1.651 m)   Wt 156 lb (70.8 kg)   SpO2 93%   BMI 25.96 kg/m    Physical Exam GENERAL: Alert, cooperative, well developed, no acute distress. HEAD: Normocephalic atraumatic. EYES: Extraocular movements intact BL, pupils round, equal and reactive to light BL, conjunctivae normal BL. EARS: Tympanic membrane, ear canal and external ear normal BL. External auditory canals clear with minimal cerumen. NOSE: No congestion or rhinorrhea, mucous membranes are moist. THROAT: No oropharyngeal exudate or posterior oropharyngeal erythema. CARDIOVASCULAR: Normal heart rate and rhythm, S1 and S2 normal without murmurs. CHEST: Clear to auscultation bilaterally, no wheezes, rhonchi, or crackles. ABDOMEN: Soft, non tender, non distended, without organomegaly, normal bowel sounds. EXTREMITIES: No  cyanosis or edema. NEUROLOGICAL: Oriented to person, place and time, no gait abnormalities, moves all extremities without gross motor or sensory deficit. Cranial nerves grossly intact, normal proprioception including heel to shin intact.        Yamileth Hayse K Zakhai Meisinger, MD  07/04/24    Contains text generated by [employer].    "

## 2024-07-07 LAB — VITAMIN B6: Vitamin B6: 46.4 ng/mL — ABNORMAL HIGH (ref 2.1–21.7)

## 2024-07-10 ENCOUNTER — Ambulatory Visit: Payer: Self-pay

## 2024-08-01 ENCOUNTER — Other Ambulatory Visit

## 2024-08-02 ENCOUNTER — Ambulatory Visit: Admitting: Diagnostic Neuroimaging

## 2024-08-02 ENCOUNTER — Ambulatory Visit: Payer: Self-pay

## 2024-08-02 ENCOUNTER — Ambulatory Visit

## 2024-11-15 ENCOUNTER — Ambulatory Visit: Payer: Self-pay | Admitting: Nurse Practitioner
